# Patient Record
Sex: Female | Born: 1940
Health system: Southern US, Community
[De-identification: ages and names within clinical notes are randomized; demographics above are authoritative.]

## PROBLEM LIST (undated history)

## (undated) DIAGNOSIS — K5792 Diverticulitis of intestine, part unspecified, without perforation or abscess without bleeding: Secondary | ICD-10-CM

## (undated) DIAGNOSIS — Z853 Personal history of malignant neoplasm of breast: Secondary | ICD-10-CM

## (undated) DIAGNOSIS — M75102 Unspecified rotator cuff tear or rupture of left shoulder, not specified as traumatic: Secondary | ICD-10-CM

## (undated) DIAGNOSIS — I10 Essential (primary) hypertension: Secondary | ICD-10-CM

## (undated) DIAGNOSIS — K219 Gastro-esophageal reflux disease without esophagitis: Secondary | ICD-10-CM

## (undated) DIAGNOSIS — E785 Hyperlipidemia, unspecified: Secondary | ICD-10-CM

## (undated) DIAGNOSIS — M419 Scoliosis, unspecified: Secondary | ICD-10-CM

## (undated) DIAGNOSIS — C801 Malignant (primary) neoplasm, unspecified: Secondary | ICD-10-CM

## (undated) DIAGNOSIS — K5790 Diverticulosis of intestine, part unspecified, without perforation or abscess without bleeding: Secondary | ICD-10-CM

## (undated) DIAGNOSIS — Z923 Personal history of irradiation: Secondary | ICD-10-CM

## (undated) DIAGNOSIS — H409 Unspecified glaucoma: Secondary | ICD-10-CM

## (undated) DIAGNOSIS — I2510 Atherosclerotic heart disease of native coronary artery without angina pectoris: Secondary | ICD-10-CM

## (undated) HISTORY — DX: Unspecified rotator cuff tear or rupture of left shoulder, not specified as traumatic: M75.102

## (undated) HISTORY — DX: Diverticulitis of intestine, part unspecified, without perforation or abscess without bleeding: K57.92

## (undated) HISTORY — PX: NASAL SINUS SURGERY: SHX719

## (undated) HISTORY — DX: Hyperlipidemia, unspecified: E78.5

## (undated) HISTORY — DX: Scoliosis, unspecified: M41.9

## (undated) HISTORY — DX: Gastro-esophageal reflux disease without esophagitis: K21.9

## (undated) HISTORY — DX: Personal history of malignant neoplasm of breast: Z85.3

## (undated) HISTORY — DX: Diverticulosis of intestine, part unspecified, without perforation or abscess without bleeding: K57.90

## (undated) HISTORY — DX: Essential (primary) hypertension: I10

## (undated) HISTORY — DX: Unspecified glaucoma: H40.9

## (undated) HISTORY — DX: Atherosclerotic heart disease of native coronary artery without angina pectoris: I25.10

---

## 1979-10-09 HISTORY — PX: APPENDECTOMY: SHX54

## 1980-10-08 HISTORY — PX: BREAST LUMPECTOMY: SHX2

## 1982-10-08 HISTORY — PX: BREAST BIOPSY: SHX20

## 1999-07-03 ENCOUNTER — Other Ambulatory Visit: Admission: RE | Admit: 1999-07-03 | Discharge: 1999-07-03 | Payer: Self-pay | Admitting: Obstetrics and Gynecology

## 2000-02-07 ENCOUNTER — Ambulatory Visit (HOSPITAL_COMMUNITY): Admission: RE | Admit: 2000-02-07 | Discharge: 2000-02-07 | Payer: Self-pay | Admitting: Specialist

## 2000-06-03 ENCOUNTER — Encounter: Payer: Self-pay | Admitting: Obstetrics and Gynecology

## 2000-06-03 ENCOUNTER — Encounter: Admission: RE | Admit: 2000-06-03 | Discharge: 2000-06-03 | Payer: Self-pay | Admitting: Obstetrics and Gynecology

## 2000-09-06 ENCOUNTER — Other Ambulatory Visit: Admission: RE | Admit: 2000-09-06 | Discharge: 2000-09-06 | Payer: Self-pay | Admitting: *Deleted

## 2001-01-02 ENCOUNTER — Ambulatory Visit (HOSPITAL_COMMUNITY): Admission: RE | Admit: 2001-01-02 | Discharge: 2001-01-02 | Payer: Self-pay | Admitting: Gastroenterology

## 2001-06-16 ENCOUNTER — Encounter: Payer: Self-pay | Admitting: Obstetrics and Gynecology

## 2001-06-16 ENCOUNTER — Encounter: Admission: RE | Admit: 2001-06-16 | Discharge: 2001-06-16 | Payer: Self-pay | Admitting: Obstetrics and Gynecology

## 2002-09-29 ENCOUNTER — Encounter: Payer: Self-pay | Admitting: Family Medicine

## 2002-09-29 ENCOUNTER — Encounter: Admission: RE | Admit: 2002-09-29 | Discharge: 2002-09-29 | Payer: Self-pay | Admitting: Family Medicine

## 2003-11-29 ENCOUNTER — Encounter: Admission: RE | Admit: 2003-11-29 | Discharge: 2003-11-29 | Payer: Self-pay | Admitting: Family Medicine

## 2004-11-13 ENCOUNTER — Other Ambulatory Visit: Admission: RE | Admit: 2004-11-13 | Discharge: 2004-11-13 | Payer: Self-pay | Admitting: Family Medicine

## 2005-01-12 ENCOUNTER — Encounter: Admission: RE | Admit: 2005-01-12 | Discharge: 2005-01-12 | Payer: Self-pay | Admitting: Family Medicine

## 2006-04-02 ENCOUNTER — Encounter: Admission: RE | Admit: 2006-04-02 | Discharge: 2006-04-02 | Payer: Self-pay | Admitting: Family Medicine

## 2006-09-10 ENCOUNTER — Other Ambulatory Visit: Admission: RE | Admit: 2006-09-10 | Discharge: 2006-09-10 | Payer: Self-pay | Admitting: Family Medicine

## 2006-10-08 HISTORY — PX: COLONOSCOPY: SHX174

## 2006-12-02 ENCOUNTER — Encounter: Payer: Self-pay | Admitting: Internal Medicine

## 2007-06-19 ENCOUNTER — Encounter: Admission: RE | Admit: 2007-06-19 | Discharge: 2007-06-19 | Payer: Self-pay | Admitting: Family Medicine

## 2008-01-19 ENCOUNTER — Other Ambulatory Visit: Admission: RE | Admit: 2008-01-19 | Discharge: 2008-01-19 | Payer: Self-pay | Admitting: Family Medicine

## 2008-02-06 HISTORY — PX: CORONARY ANGIOPLASTY WITH STENT PLACEMENT: SHX49

## 2008-02-16 ENCOUNTER — Encounter: Payer: Self-pay | Admitting: Internal Medicine

## 2008-02-16 ENCOUNTER — Ambulatory Visit (HOSPITAL_COMMUNITY): Admission: RE | Admit: 2008-02-16 | Discharge: 2008-02-17 | Payer: Self-pay | Admitting: Cardiology

## 2008-02-17 ENCOUNTER — Encounter: Payer: Self-pay | Admitting: Internal Medicine

## 2008-02-17 LAB — CONVERTED CEMR LAB
BUN: 11 mg/dL
CO2: 27 meq/L
Calcium: 8.5 mg/dL
HCT: 37.2 %
Hemoglobin: 12.7 g/dL
MCV: 89.7 fL
RBC: 4.15 M/uL
Sodium: 140 meq/L
WBC: 9.1 10*3/uL

## 2008-03-15 ENCOUNTER — Encounter: Payer: Self-pay | Admitting: Internal Medicine

## 2008-07-13 ENCOUNTER — Encounter: Admission: RE | Admit: 2008-07-13 | Discharge: 2008-07-13 | Payer: Self-pay | Admitting: Family Medicine

## 2008-11-16 ENCOUNTER — Encounter: Payer: Self-pay | Admitting: Internal Medicine

## 2008-12-07 ENCOUNTER — Encounter: Payer: Self-pay | Admitting: Internal Medicine

## 2008-12-07 ENCOUNTER — Other Ambulatory Visit: Admission: RE | Admit: 2008-12-07 | Discharge: 2008-12-07 | Payer: Self-pay | Admitting: Family Medicine

## 2008-12-07 LAB — CONVERTED CEMR LAB
Albumin: 4.2 g/dL
BUN: 16 mg/dL
Calcium: 9.6 mg/dL
Glucose, Bld: 73 mg/dL
Hemoglobin: 15 g/dL
Lymphocytes, automated: 25.8 %
Monocytes Relative: 0.6 %
Potassium: 5.1 meq/L
Sodium: 142 meq/L
Total Protein: 6.7 g/dL
WBC: 7.6 10*3/uL

## 2009-06-01 ENCOUNTER — Encounter: Payer: Self-pay | Admitting: Internal Medicine

## 2009-06-01 LAB — CONVERTED CEMR LAB
ALT: 33 units/L
BUN: 21 mg/dL
Calcium: 9.2 mg/dL
Creatinine, Ser: 1.2 mg/dL
Glucose, Bld: 81 mg/dL
HDL: 47 mg/dL
LDL Cholesterol: 92 mg/dL
Potassium: 5.4 meq/L
Total Bilirubin: 0.6 mg/dL
Total Protein: 6.1 g/dL

## 2009-07-08 LAB — CONVERTED CEMR LAB

## 2009-07-26 ENCOUNTER — Encounter: Admission: RE | Admit: 2009-07-26 | Discharge: 2009-07-26 | Payer: Self-pay | Admitting: Family Medicine

## 2009-08-01 ENCOUNTER — Encounter: Payer: Self-pay | Admitting: Internal Medicine

## 2009-08-03 ENCOUNTER — Encounter: Payer: Self-pay | Admitting: Internal Medicine

## 2009-11-15 ENCOUNTER — Encounter: Payer: Self-pay | Admitting: Internal Medicine

## 2009-11-15 LAB — CONVERTED CEMR LAB
HDL: 47 mg/dL
LDL Cholesterol: 92 mg/dL

## 2009-12-20 ENCOUNTER — Encounter: Payer: Self-pay | Admitting: Internal Medicine

## 2010-02-14 ENCOUNTER — Encounter: Payer: Self-pay | Admitting: Internal Medicine

## 2010-05-16 ENCOUNTER — Encounter: Payer: Self-pay | Admitting: Internal Medicine

## 2010-05-16 LAB — CONVERTED CEMR LAB
AST: 21 units/L
Cholesterol: 164 mg/dL
HDL: 43 mg/dL
LDL Cholesterol: 106 mg/dL

## 2010-07-31 ENCOUNTER — Encounter: Admission: RE | Admit: 2010-07-31 | Discharge: 2010-07-31 | Payer: Self-pay | Admitting: Family Medicine

## 2010-07-31 LAB — HM MAMMOGRAPHY: HM Mammogram: NEGATIVE

## 2010-09-15 ENCOUNTER — Encounter: Payer: Self-pay | Admitting: Internal Medicine

## 2010-09-15 DIAGNOSIS — I10 Essential (primary) hypertension: Secondary | ICD-10-CM

## 2010-09-15 DIAGNOSIS — Z853 Personal history of malignant neoplasm of breast: Secondary | ICD-10-CM

## 2010-09-15 DIAGNOSIS — I2510 Atherosclerotic heart disease of native coronary artery without angina pectoris: Secondary | ICD-10-CM

## 2010-09-15 DIAGNOSIS — E785 Hyperlipidemia, unspecified: Secondary | ICD-10-CM

## 2010-09-22 ENCOUNTER — Ambulatory Visit: Payer: Self-pay | Admitting: Internal Medicine

## 2010-09-22 DIAGNOSIS — R32 Unspecified urinary incontinence: Secondary | ICD-10-CM

## 2010-09-22 DIAGNOSIS — K219 Gastro-esophageal reflux disease without esophagitis: Secondary | ICD-10-CM

## 2010-09-25 DIAGNOSIS — M67919 Unspecified disorder of synovium and tendon, unspecified shoulder: Secondary | ICD-10-CM | POA: Insufficient documentation

## 2010-09-25 DIAGNOSIS — M719 Bursopathy, unspecified: Secondary | ICD-10-CM

## 2010-10-05 ENCOUNTER — Encounter: Payer: Self-pay | Admitting: Internal Medicine

## 2010-10-05 DIAGNOSIS — Z8679 Personal history of other diseases of the circulatory system: Secondary | ICD-10-CM | POA: Insufficient documentation

## 2010-10-06 ENCOUNTER — Telehealth (INDEPENDENT_AMBULATORY_CARE_PROVIDER_SITE_OTHER): Payer: Self-pay | Admitting: *Deleted

## 2010-10-11 ENCOUNTER — Encounter: Payer: Self-pay | Admitting: Internal Medicine

## 2010-10-29 ENCOUNTER — Encounter: Payer: Self-pay | Admitting: Family Medicine

## 2010-11-08 ENCOUNTER — Ambulatory Visit (INDEPENDENT_AMBULATORY_CARE_PROVIDER_SITE_OTHER): Payer: Medicare Other | Admitting: Internal Medicine

## 2010-11-08 ENCOUNTER — Encounter: Payer: Self-pay | Admitting: Internal Medicine

## 2010-11-08 DIAGNOSIS — J329 Chronic sinusitis, unspecified: Secondary | ICD-10-CM | POA: Insufficient documentation

## 2010-11-08 DIAGNOSIS — J309 Allergic rhinitis, unspecified: Secondary | ICD-10-CM | POA: Insufficient documentation

## 2010-11-08 DIAGNOSIS — J019 Acute sinusitis, unspecified: Secondary | ICD-10-CM

## 2010-11-09 NOTE — Letter (Signed)
Summary: Deboraha Sprang @ [facility] @ [employer]   Imported By: Sherian Rein 10/18/2010 12:13:18  _____________________________________________________________________  External Attachment:    Type:   Image     Comment:   External Document

## 2010-11-09 NOTE — Letter (Signed)
Summary: [facility]  [facility]   Imported By: Sherian Rein 10/18/2010 12:16:31  _____________________________________________________________________  External Attachment:    Type:   Image     Comment:   External Document

## 2010-11-09 NOTE — Letter (Signed)
Summary: [facility]  [facility]   Imported By: Sherian Rein 10/18/2010 12:15:31  _____________________________________________________________________  External Attachment:    Type:   Image     Comment:   External Document

## 2010-11-09 NOTE — Cardiovascular Report (Signed)
Summary: [place]  [place]   Imported By: Sherian Rein 10/18/2010 12:19:47  _____________________________________________________________________  External Attachment:    Type:   Image     Comment:   External Document

## 2010-11-09 NOTE — Letter (Signed)
Summary: [facility]  [facility]   Imported By: Sherian Rein 10/18/2010 12:14:28  _____________________________________________________________________  External Attachment:    Type:   Image     Comment:   External Document

## 2010-11-09 NOTE — Assessment & Plan Note (Signed)
Summary: new medicare pt-#-pkg--stc   Vital Signs:  Patient profile:   70 year old female Height:      65 inches (165.10 cm) Weight:      150.12 pounds (68.24 kg) BMI:     25.07 O2 Sat:      95 % on Room air Temp:     97.2 degrees F (36.22 degrees C) oral Pulse rate:   91 / minute BP sitting:   132 / 82  (left arm) Cuff size:   regular  Vitals Entered By: Orlan Leavens RMA (September 22, 2010 3:02 PM)  O2 Flow:  Room air CC: New patient Is Patient Diabetic? No Pain Assessment Patient in pain? no        Primary Care Kirstina Leinweber:  Newt Lukes MD  CC:  New patient.  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 denies adverse side effects related to current therapy. no CP or angina symptoms (but had none prior to elective cath when stents placed)  dyslipidemia - has been followed by cards - reports compliance with ongoing medical treatment and no changes in medication dose or frequency. denies adverse side effects related to current therapy.   breast cancer, right - s/p XRT 1984 - no evidence for recurrence  GERD - reports compliance with ongoing medical treatment and no changes in medication dose or frequency. denies adverse side effects related to current therapy. no abd pain or melena or hx ulcers  Preventive Screening-Counseling & Management  Alcohol-Tobacco     Alcohol drinks/day: <1     Alcohol type: wine     Alcohol Counseling: not indicated; use of alcohol is  not excessive or problematic     Smoking Status: never     Tobacco Counseling: not indicated; no tobacco use  Caffeine-Diet-Exercise     Nutrition Referrals: no     Does Patient Exercise: yes     Exercise Counseling: not indicated; exercise is adequate     Depression Counseling: not indicated; screening negative for depression  Safety-Violence-Falls     Seat Belt Counseling: not indicated; patient wears seat belts     Firearms in the Home: no firearms in the home     Firearm Counseling: not applicable     Violence Counseling: not indicated; no violence risk noted     Fall Risk Counseling: not indicated; no significant falls noted  Clinical Review Panels:  Prevention   Last Mammogram:  ASSESSMENT: Negative - BI-RADS 1^MM DIGITAL SCREENING (07/31/2010)   Last Pap Smear:  Interpretation Result:Negative for intraepithelial Lesion or Malignancy.    (07/08/2009)   Last Colonoscopy:  Location:  [facility].  Results: Hemorrhoids, internal, otherwise normal exam     (01/02/2001)  CBC   WBC:  9.1 (02/17/2008)   RBC:  4.15 (02/17/2008)   Hgb:  12.7 (02/17/2008)   Hct:  37.2 (02/17/2008)   Platelets:  288 (02/17/2008)   MCV  89.7 (02/17/2008)   RDW  12.4 (02/17/2008)  Complete  Metabolic Panel   Glucose:  111 (02/17/2008)   Sodium:  140 (02/17/2008)   Potassium:  4.0 (02/17/2008)   Chloride:  107 (02/17/2008)   CO2:  27 (02/17/2008)   BUN:  11 (02/17/2008)   Creatinine:  0.90 (02/17/2008)   Calcium:  8.5 (02/17/2008)   Current Medications (verified): 1)  Evista 60 Mg Tabs (Raloxifene Hcl) .... Take 1 Every Other Day 2)  Plavix 75 Mg Tabs (Clopidogrel Bisulfate) .... Take 1 By Mouth Once Daily 3)  Nitroglycerin 0.4 Mg/hr Pt24 (Nitroglycerin) .... Use As Needed 4)  Pantoprazole Sodium 40 Mg Tbec (Pantoprazole Sodium) .... Take 1 By Mouth Once Daily 5)  Zetia 10 Mg Tabs (Ezetimibe) .... Take 1 By Mouth Once Daily 6)  Crestor 10 Mg Tabs (Rosuvastatin Calcium) .... Take 1  By Mouth Once Daily 7)  Aspirin 81 Mg Tabs (Aspirin) .... 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 I have reviewed all other systems and they were negative.   Physical Exam  General:  spry, alert, well-developed, well-nourished, and cooperative to examination.    Head:  Normocephalic and atraumatic without obvious abnormalities. No apparent alopecia or balding. Eyes:  vision grossly intact; pupils equal, round and reactive to light.  conjunctiva and lids normal.    Ears:  normal pinnae bilaterally, without erythema, swelling, or tenderness to palpation. TMs clear, without effusion, or cerumen impaction. Hearing grossly normal bilaterally  Mouth:  teeth and gums in good repair; mucous membranes moist, without lesions or ulcers. oropharynx clear without exudate, no erythema.  Neck:  supple, full ROM, no masses, no thyromegaly; no thyroid nodules or tenderness. no JVD or carotid bruits.   Lungs:  normal respiratory effort, no intercostal retractions or use of accessory muscles; normal breath sounds bilaterally - no crackles and no wheezes.    Heart:  normal rate, regular rhythm, no murmur, and no rub. BLE without edema. normal DP pulses and normal cap refill in all 4 extremities     Abdomen:  soft, non-tender, normal bowel sounds, no distention; no masses and no appreciable hepatomegaly or splenomegaly.   Genitalia:  defer Msk:  left shoulder - decreased range of motion on forward flexion, abduction, and internal rotation. Positive impingement signs. Decreased strength with stressing of rotator cuff.  Pain with crossed arm adduction with referred pain into distal deltoid. Tender over a.c. joint and subacromial.  Neurologic:  alert & oriented X3 and cranial nerves II-XII symetrically intact.  strength normal in all extremities, sensation intact to light touch, and gait normal. speech fluent without dysarthria or aphasia; follows commands with good comprehension.  Skin:  no rashes, vesicles, ulcers, or erythema. No nodules or irregularity to palpation.  Psych:  Oriented X3, memory intact for recent and remote, normally interactive, good eye contact, not anxious  appearing, not depressed appearing, and not agitated.      Impression & Recommendations:  Problem # 1:  ROTATOR CUFF SYNDROME, LEFT (ICD-726.10)  denies precipitatiing injury or trauma - change NSAID to rx diclofenac and refer to PT for eval and tx consider need for injection, MRI and or ortho eval depending on response to consev tx  Orders: Prescription Created Electronically [phone]) Physical Therapy Referral (PT)  Problem # 2:  HYPERLIPIDEMIA (ICD-272.4) known CAD and stents 2009 - send for records.labs from cards and cont same meds - will f/u here FLP and LFT when due Her updated medication list for this problem includes:    Zetia 10 Mg Tabs (Ezetimibe) .Marland Kitchen... Take 1 by mouth once daily    Crestor 10 Mg Tabs (Rosuvastatin calcium) .Marland Kitchen... Take 1 by mouth once daily  Problem # 3:  CORONARY ARTERY DISEASE (ICD-414.00) DES x 3 to RCA and LAD 02/2008 - cath report reviewed today no anginal symptoms - cont current meds will need to est with new cards due to upcoming retirement of her card - will refer as needed    Her updated medication list for this problem includes:    Plavix 75 Mg Tabs (Clopidogrel bisulfate) .Marland Kitchen... Take 1 by mouth once daily    Nitroglycerin 0.4 Mg/hr Pt24 (Nitroglycerin) ..... Use as needed    Aspirin 81 Mg Tabs (Aspirin) .Marland Kitchen... Take 1 by mouth once daily  Problem # 4:  BREAST CANCER, HX OF (ICD-V10.3) XRT 1984 - annual mammo with recurrence since that time - reviewed last mammo 07/2010 today  Problem # 5:  GERD (ICD-530.81)  The following medications were removed from the medication list:    Prilosec 20 Mg Cpdr (Omeprazole) .Marland Kitchen... Take 1 by mouth once daily Her updated medication list for this problem includes:    Pantoprazole Sodium 40 Mg Tbec (Pantoprazole sodium) .Marland Kitchen... Take 1 by mouth once daily  Labs Reviewed: Hgb: 12.7 (02/17/2008)   Hct: 37.2 (02/17/2008)  Complete Medication List: 1)  Evista 60 Mg Tabs (Raloxifene hcl) .... Take 1 every other day 2)  Plavix 75 Mg Tabs (Clopidogrel bisulfate) .... Take 1 by mouth once daily 3)  Nitroglycerin 0.4 Mg/hr Pt24 (Nitroglycerin) .... Use as needed 4)  Pantoprazole Sodium 40 Mg Tbec (Pantoprazole sodium) .... Take 1 by mouth once daily 5)  Zetia 10 Mg Tabs (Ezetimibe) .... Take 1 by mouth once daily 6)  Crestor 10 Mg Tabs (Rosuvastatin calcium) .... Take 1 by mouth once daily 7)  Aspirin 81 Mg Tabs (Aspirin) .... Take 1 by mouth once daily 8)  Diclofenac Sodium 75 Mg Tbec (Diclofenac sodium) .Marland Kitchen.. 1 by mouth two times a day x 7 days, then as needed for pain  Other Orders: Pneumococcal Vaccine ([address]) Admin 1st Vaccine (09811)  Patient Instructions: 1)  it was good to see you today. 2)  change advil to diclofenac for antiinflammatory relief - your prescription has been electronically submitted to your pharmacy. Please take as directed. Contact our office if you believe you're having problems with the medication(s). 3)  no other medication changes today. 4)  we'll make referral to physical therapy for your left shoulder  pain. Our office will contact you regarding this appointment once made.  5)  If pain not improved with medication change and therapy efforts, call to consider steroid shot or other treatment options 6)  will send for records and labs from dr. Amil Amen 7)  Please schedule a follow-up appointment in 3-6 months (or when due for cholesterol  check), call sooner if problems.  Prescriptions: DICLOFENAC SODIUM 75 MG TBEC (DICLOFENAC SODIUM) 1 by mouth two times a day x 7 days, then as needed for pain  #30 x 0   Entered and Authorized by:   Newt Lukes MD   Signed by:   Newt Lukes MD on 09/22/2010   Method used:   Electronically to        [facility] [phone]* (retail)       [address] [place], [place]  [address]       Ph: [phone] or [phone]       Fax: [phone]   RxID:   [id number]    Orders Added: 1)  Pneumococcal Vaccine [90732] 2)  Admin 1st Vaccine [90471] 3)  New Patient Level IV [66440] 4)  Prescription Created Electronically [G8553] 5)  Physical Therapy Referral [PT]   Immunizations Administered:  Pneumonia Vaccine:    Vaccine Type: Pneumovax (Medicare)    Site: left deltoid    Mfr: Merck    Dose: 0.5 ml    Route: IM    Given by: Orlan Leavens RMA    Exp. Date: 02/02/2012    Lot #: [id number]    VIS given: 09/22/10   Immunizations Administered:  Pneumonia Vaccine:    Vaccine Type: Pneumovax (Medicare)    Site: left deltoid    Mfr: Merck    Dose: 0.5 ml    Route: IM    Given by: Orlan Leavens RMA    Exp. Date: 02/02/2012    Lot #: [id number]    VIS given: 09/22/10   Pap Smear  Procedure date:  07/08/2009  Findings:      Interpretation Result:Negative for intraepithelial Lesion or Malignancy.     Mammogram  Procedure date:  07/08/2010  Findings:      No specific mammographic evidence of malignancy.

## 2010-11-09 NOTE — Progress Notes (Signed)
  Phone Note Other Incoming   Request: Send information Summary of Call: Records received from Dr. Abigail Miyamoto. 82 pages forwarded to Dr. Felicity Coyer for review.

## 2010-11-15 NOTE — Assessment & Plan Note (Signed)
Summary: ?sinus infection-lb   Vital Signs:  Patient profile:   70 year old female Height:      65 inches (165.10 cm) Weight:      150 pounds (68.18 kg) O2 Sat:      97 % on Room air Temp:     98.0 degrees F (36.67 degrees C) oral Pulse rate:   97 / minute BP sitting:   142 / 82  (left arm) Cuff size:   regular  Vitals Entered By: Orlan Leavens RMA (November 08, 2010 1:20 PM)  O2 Flow:  Room air CC: Sinus infection, URI symptoms Is Patient Diabetic? No Pain Assessment Patient in pain? no      Comments Pt states sxs been going on a whilr. Been taking alka setzer, corcidin, and nyquil. still not better. Not able to sleep at night due to cough   Primary Care Provider:  Newt Lukes MD  CC:  Sinus infection and URI symptoms.  History of Present Illness:  URI Symptoms      This is a 70 year old woman who presents with URI symptoms.  The symptoms began 4 weeks ago.  The severity is described as moderate.  predom L side symptoms - max and frontal region.  The patient reports nasal congestion, purulent nasal discharge, productive cough, and sick contacts, but denies earache.  Associated symptoms include low-grade fever (<100.5 degrees) and response to antipyretic.  The patient denies dyspnea, wheezing, vomiting, and diarrhea.  The patient also reports sneezing, headache, muscle aches, and severe fatigue.  The patient denies response to antihistamine.  Risk factors for Strep sinusitis include unilateral facial pain, poor response to decongestant, and double sickening.  The patient denies the following risk factors for Strep sinusitis: tooth pain, Strep exposure, and absence of cough.    also reviewed chronic med issues: CAD - s/p stent x 3 2009 - reports compliance with ongoing medical treatment and no changes in medication dose or frequency. denies adverse side effects related to current therapy. no CP or angina symptoms (but had none prior to elective cath when stents  placed)  dyslipidemia - has been followed by cards - reports compliance with ongoing medical treatment and no changes in medication dose or frequency. denies adverse side effects related to current therapy.   breast cancer, right - s/p XRT 1984 - no evidence for recurrence  GERD - reports compliance with ongoing medical treatment and no changes in medication dose or frequency. denies adverse side effects related to current therapy. no abd pain or melena or hx ulcers  Current Medications (verified): 1)  Evista 60 Mg Tabs (Raloxifene Hcl) .... Take 1 Every Other Day 2)  Plavix 75 Mg Tabs (Clopidogrel Bisulfate) .... Take 1 By Mouth Once Daily 3)  Nitroglycerin 0.4 Mg/hr Pt24 (Nitroglycerin) .... Use As Needed 4)  Pantoprazole Sodium 40 Mg Tbec (Pantoprazole Sodium) .... Take 1 By Mouth Once Daily 5)  Zetia 10 Mg Tabs (Ezetimibe) .... Take 1 By Mouth Once Daily 6)  Crestor 10 Mg Tabs (Rosuvastatin Calcium) .... Take 1 By Mouth Once Daily 7)  Aspirin 81 Mg Tabs (Aspirin) .... Take 1 By Mouth Once Daily 8)  Diclofenac Sodium 75 Mg Tbec (Diclofenac Sodium) .Marland Kitchen.. 1 By Mouth Two Times A Day X 7 Days, Then As Needed For Pain  Allergies (verified): No Known Drug Allergies  Past History:  Past Medical History: Breast cancer, hx - right - s/p XRT 1984 Coronary artery disease - DES RCA and LAD  02/2008 Hyperlipidemia GERD Urinary incontinence scoliosis with mild chronic LBP  allergic rhinitis  MD roster:  card - edmunds ENT - shoemaker  Review of Systems  The patient denies chest pain, syncope, hemoptysis, and abdominal pain.    Physical Exam  General:  spry, alert, well-developed, well-nourished, and cooperative to examination.   mildly ill, coughing (deep) Eyes:  vision grossly intact; pupils equal, round and reactive to light.  conjunctiva and lids normal.    Ears:  R ear normal and L ear normal.   Mouth:  teeth and gums in good repair; mucous membranes moist, without lesions or  ulcers. oropharynx clear without exudate, mod erythema. +thick yellow pnd Lungs:  normal respiratory effort, no intercostal retractions or use of accessory muscles; normal breath sounds bilaterally - no crackles and no wheezes.    Heart:  normal rate, regular rhythm, no murmur, and no rub. BLE without edema.    Impression & Recommendations:  Problem # 1:  ACUTE SINUSITIS, UNSPECIFIED (ICD-461.9)  Her updated medication list for this problem includes:    Amoxicillin-pot Clavulanate 875-125 Mg Tabs (Amoxicillin-pot clavulanate) .Marland Kitchen... 1 by mouth two times a day x 10days    Fluticasone Propionate 50 Mcg/act Susp (Fluticasone propionate) .Marland Kitchen... 2 sprays each nostril  every morning    Hydromet 5-1.5 Mg/2ml Syrp (Hydrocodone-homatropine) .Marland KitchenMarland KitchenMarland KitchenMarland Kitchen 5 cc by mouth every 4 hours as needed for cough symptoms, esp at bedtime  Instructed on treatment. Call if symptoms persist or worsen.   Orders: Prescription Created Electronically [phone])  Problem # 2:  ALLERGIC RHINITIS (ICD-477.9)  Her updated medication list for this problem includes:    Fluticasone Propionate 50 Mcg/act Susp (Fluticasone propionate) .Marland Kitchen... 2 sprays each nostril  every morning    Loratadine 10 Mg Tabs (Loratadine) .Marland Kitchen... 1 by mouth once daily x 10day, then as needed for allergy symptoms  Discussed use of allergy medications and environmental measures.   Orders: Prescription Created Electronically [phone])  Complete Medication List: 1)  Evista 60 Mg Tabs (Raloxifene hcl) .... Take 1 every other day 2)  Plavix 75 Mg Tabs (Clopidogrel bisulfate) .... Take 1 by mouth once daily 3)  Nitroglycerin 0.4 Mg/hr Pt24 (Nitroglycerin) .... Use as needed 4)  Pantoprazole Sodium 40 Mg Tbec (Pantoprazole sodium) .... Take 1 by mouth once daily 5)  Zetia 10 Mg Tabs (Ezetimibe) .... Take 1 by mouth once daily 6)  Crestor 10 Mg Tabs (Rosuvastatin calcium) .... Take 1 by mouth once daily 7)  Aspirin 81 Mg Tabs (Aspirin) .... Take 1 by mouth once  daily 8)  Diclofenac Sodium 75 Mg Tbec (Diclofenac sodium) .Marland Kitchen.. 1 by mouth two times a day x 7 days, then as needed for pain 9)  Amoxicillin-pot Clavulanate 875-125 Mg Tabs (Amoxicillin-pot clavulanate) .Marland Kitchen.. 1 by mouth two times a day x 10days 10)  Fluticasone Propionate 50 Mcg/act Susp (Fluticasone propionate) .... 2 sprays each nostril  every morning 11)  Loratadine 10 Mg Tabs (Loratadine) .Marland Kitchen.. 1 by mouth once daily x 10day, then as needed for allergy symptoms 12)  Hydromet 5-1.5 Mg/16ml Syrp (Hydrocodone-homatropine) .... 5 cc by mouth every 4 hours as needed for cough symptoms, esp at bedtime  Patient Instructions: 1)  it was good to see you today. 2)  augmentin antibioitcs and flonase nasal spray for sinus and allergy symptoms, hydromet cough syrup - your prescriptions have been electronically submitted or faxed to your pharmacy. Please take as directed. Contact our office if you believe you're having problems with the medication(s).  3)  use over the counter generic claritin as discussed (listed below) 4)  Get plenty of rest, drink lots of clear liquids, and use Tylenol or Ibuprofen for fever and comfort. Return in 7-10 days if you're not better:sooner if you're feeling worse. Prescriptions: HYDROMET 5-1.5 MG/5ML SYRP (HYDROCODONE-HOMATROPINE) 5 cc by mouth every 4 hours as needed for cough symptoms, esp at bedtime  #100cc x 0   Entered and Authorized by:   Newt Lukes MD   Signed by:   Newt Lukes MD on 11/08/2010   Method used:   Printed then faxed to ...       [facility]  [address] [phone]* (retail)       [address] [place], [place]  [address]       Ph: [phone] or [phone]       Fax: [phone]   RxID:   [id number] FLUTICASONE PROPIONATE 50 MCG/ACT SUSP (FLUTICASONE PROPIONATE) 2 sprays each nostril  every morning  #1 x 1   Entered and Authorized by:   Newt Lukes MD   Signed by:   Newt Lukes MD on 11/08/2010   Method used:    Electronically to        [facility] [address] [facility] [phone]* (retail)       [address] [place], [place]  [address]       Ph: [phone] or [phone]       Fax: [phone]   RxID:   [id number] AMOXICILLIN-POT CLAVULANATE 875-125 MG TABS (AMOXICILLIN-POT CLAVULANATE) 1 by mouth two times a day x 10days  #20 x 0   Entered and Authorized by:   Newt Lukes MD   Signed by:   Newt Lukes MD on 11/08/2010   Method used:   Electronically to        [facility] [phone]* (retail)       [address] [place], [place]  [address]       Ph: [phone] or [phone]       Fax: [phone]   RxID:   [id number]    Orders Added: 1)  Est. Patient Level IV [85[address]] 2)  Prescription Created Electronically [id number]    Prevention & Chronic Care Immunizations   Influenza vaccine: Not documented    Tetanus booster: 10/08/2005: Historical    Pneumococcal vaccine: Pneumovax (Medicare)  (09/22/2010)    H. zoster vaccine: Not documented  Colorectal Screening   Hemoccult: Not documented    Colonoscopy: Location:  [facility].   Results: Hemorrhoids, internal    Results: Diverticulosis.         (12/02/2006)  Other Screening   Pap smear: Interpretation Result:Negative for intraepithelial Lesion or Malignancy.     (07/08/2009)    Mammogram: ASSESSMENT: Negative - BI-RADS 1^MM DIGITAL SCREENING  (07/31/2010)   Mammogram action/deferral: Screening mammogram in 1 year.     (07/31/2010)    DXA bone density scan: Not documented   Smoking status: never  (09/22/2010)  Lipids   Total Cholesterol: 164  (05/16/2010)   LDL: 106  (05/16/2010)   LDL Direct: Not documented   HDL: 43  (05/16/2010)   Triglycerides: Not documented    SGOT (AST): 21  (05/16/2010)   SGPT (ALT): 22  (05/16/2010)   Alkaline phosphatase: 59  (05/16/2010)   Total bilirubin: 0.6  (06/01/2009)  Hypertension   Last  Blood Pressure: 142 / 82  (11/08/2010)   Serum creatinine: 1.20   (06/01/2009)   Serum potassium 5.4  (06/01/2009)  Self-Management Support :    Hypertension self-management support: Not documented    Lipid self-management support: Not documented

## 2010-11-17 ENCOUNTER — Telehealth: Payer: Self-pay | Admitting: Internal Medicine

## 2010-11-20 ENCOUNTER — Encounter: Payer: Self-pay | Admitting: Internal Medicine

## 2010-11-20 ENCOUNTER — Ambulatory Visit (INDEPENDENT_AMBULATORY_CARE_PROVIDER_SITE_OTHER): Payer: Medicare Other | Admitting: Internal Medicine

## 2010-11-20 ENCOUNTER — Ambulatory Visit (INDEPENDENT_AMBULATORY_CARE_PROVIDER_SITE_OTHER)
Admission: RE | Admit: 2010-11-20 | Discharge: 2010-11-20 | Disposition: A | Discharge status: Home / Self Care | Payer: Medicare Other | Source: Ambulatory Visit | Attending: Internal Medicine | Admitting: Internal Medicine

## 2010-11-20 ENCOUNTER — Other Ambulatory Visit: Payer: Self-pay | Admitting: Internal Medicine

## 2010-11-20 ENCOUNTER — Other Ambulatory Visit: Payer: Medicare Other

## 2010-11-20 DIAGNOSIS — R05 Cough: Secondary | ICD-10-CM

## 2010-11-20 DIAGNOSIS — J309 Allergic rhinitis, unspecified: Secondary | ICD-10-CM

## 2010-11-20 LAB — CBC WITH DIFFERENTIAL/PLATELET
Basophils Absolute: 0.1 10*3/uL (ref 0.0–0.1)
Eosinophils Relative: 17.1 % — ABNORMAL HIGH (ref 0.0–5.0)
MCV: 90.3 fl (ref 78.0–100.0)
Monocytes Absolute: 0.8 10*3/uL (ref 0.1–1.0)
Neutrophils Relative %: 49.6 % (ref 43.0–77.0)
Platelets: 315 10*3/uL (ref 150.0–400.0)
WBC: 8.6 10*3/uL (ref 4.5–10.5)

## 2010-11-20 LAB — BASIC METABOLIC PANEL
BUN: 14 mg/dL (ref 6–23)
Chloride: 101 mEq/L (ref 96–112)
Creatinine, Ser: 0.8 mg/dL (ref 0.4–1.2)
GFR: 77.38 mL/min (ref >60.00)

## 2010-11-23 NOTE — Progress Notes (Signed)
Summary: antibiotic  Phone Note Call from Patient   Caller: Patient Summary of Call: Patient lmovm stating that she was seen 10 days ago and given rx for antibiotic. She has completed that medication and still does not feel better. She is requesting futhur advisement. Thanks Initial call taken by: Rock Nephew CMA,  November 17, 2010 1:48 PM  Follow-up for Phone Call        please make OV to reeval symptoms - may need refer to ent or other testing (labs, ct scan?) - if fever or severe problems, should be seen in Sat clinic or go to urg care/er - thanks Follow-up by: Newt Lukes MD,  November 17, 2010 2:23 PM  Additional Follow-up for Phone Call Additional follow up Details #1::        Notified pt with md response. transferred to schedulers to make appt to see md Additional Follow-up by: Orlan Leavens RMA,  November 17, 2010 2:32 PM

## 2010-11-24 ENCOUNTER — Telehealth: Payer: Self-pay | Admitting: Internal Medicine

## 2010-11-29 ENCOUNTER — Encounter: Payer: Self-pay | Admitting: Pulmonary Disease

## 2010-11-29 ENCOUNTER — Institutional Professional Consult (permissible substitution) (INDEPENDENT_AMBULATORY_CARE_PROVIDER_SITE_OTHER): Payer: Medicare Other | Admitting: Pulmonary Disease

## 2010-11-29 DIAGNOSIS — R05 Cough: Secondary | ICD-10-CM

## 2010-11-29 NOTE — Assessment & Plan Note (Signed)
Summary: PER LUCY  D/T  STC   Vital Signs:  Patient profile:   70 year old female Height:      65 inches (165.10 cm) O2 Sat:      95 % on Room air Temp:     97.9 degrees F (36.61 degrees C) oral Pulse rate:   97 / minute BP sitting:   148 / 90  (left arm) Cuff size:   regular  Vitals Entered By: Orlan Leavens RMA (November 20, 2010 11:28 AM)  O2 Flow:  Room air CC: Ongoing sinus problem, also cough spitting upclear thick phlegm, Cough Is Patient Diabetic? No Pain Assessment Patient in pain? no        Primary Care Provider:  Newt Lukes MD  CC:  Ongoing sinus problem, also cough spitting upclear thick phlegm, and Cough.  History of Present Illness: Cough      This is a 70 year old woman who presents with Cough.  The symptoms began 6-12 months ago.  The intensity is described as moderate.  white thick sputum - constantly present. not improved with abx (10d augmentin for sinus infx <2 weeks ago) or otc cough meds.  The patient reports productive cough, non-productive cough, and malaise, but denies pleuritic chest pain, shortness of breath, wheezing, exertional dyspnea, fever, and hemoptysis.  Associated symtpoms include cold/URI symptoms, sore throat, nasal congestion, and acid reflux symptoms.  The patient denies the following symptoms: weight loss and peripheral edema.  The cough is worse with activity and lying down.  Ineffective prior treatments have included OTC cough medication, throat lozenges, and PPI.  Risk factors include recurrent sinus infections, history of allergic rhinitis, and history of reflux.  Diagnostic testing to date has included ENT evaluation (shoemaker).    also reviewed chronic med issues: CAD - s/p stent x 3 2009 - reports compliance with ongoing medical treatment and no changes in medication dose or frequency. denies adverse side effects related to current therapy. no CP or angina symptoms (but had none prior to elective cath when stents  placed)  dyslipidemia - has been followed by cards - reports compliance with ongoing medical treatment and no changes in medication dose or frequency. denies adverse side effects related to current therapy.   breast cancer, right - s/p XRT 1984 - no evidence for recurrence  GERD - reports compliance with ongoing medical treatment and no changes in medication dose or frequency. denies adverse side effects related to current therapy. no abd pain or melena or hx ulcers   Clinical Review Panels:  CBC   WBC:  7.6 (12/07/2008)   RBC:  4.88 (12/07/2008)   Hgb:  15.0 (12/07/2008)   Hct:  44.6 (12/07/2008)   Platelets:  315 (12/07/2008)   MCV  91.5 (12/07/2008)   RDW  12.4 (12/07/2008)   PMN:  62.5 (12/07/2008)   Monos:  0.6 (12/07/2008)  Complete Metabolic Panel   Glucose:  81 (06/01/2009)   Sodium:  143 (06/01/2009)   Potassium:  5.4 (06/01/2009)   Chloride:  108 (06/01/2009)   CO2:  26 (06/01/2009)   BUN:  21 (06/01/2009)   Creatinine:  1.20 (06/01/2009)   Albumin:  3.7 (06/01/2009)   Total Protein:  6.1 (06/01/2009)   Calcium:  9.2 (06/01/2009)   Total Bili:  0.6 (06/01/2009)   Alk Phos:  59 (05/16/2010)   SGPT (ALT):  22 (05/16/2010)   SGOT (AST):  21 (05/16/2010)   Current Medications (verified): 1)  Evista 60 Mg Tabs (Raloxifene Hcl) .Marland KitchenMarland KitchenMarland Kitchen  Take 1 Every Other Day 2)  Plavix 75 Mg Tabs (Clopidogrel Bisulfate) .... Take 1 By Mouth Once Daily 3)  Nitroglycerin 0.4 Mg/hr Pt24 (Nitroglycerin) .... Use As Needed 4)  Pantoprazole Sodium 40 Mg Tbec (Pantoprazole Sodium) .... Take 1 By Mouth Once Daily 5)  Zetia 10 Mg Tabs (Ezetimibe) .... Take 1 By Mouth Once Daily 6)  Crestor 10 Mg Tabs (Rosuvastatin Calcium) .... Take 1 By Mouth Once Daily 7)  Aspirin 81 Mg Tabs (Aspirin) .... Take 1 By Mouth Once Daily 8)  Diclofenac Sodium 75 Mg Tbec (Diclofenac Sodium) .Marland Kitchen.. 1 By Mouth Two Times A Day X 7 Days, Then As Needed For Pain 9)  Fluticasone Propionate 50 Mcg/act Susp (Fluticasone  Propionate) .... 2 Sprays Each Nostril  Every Morning 10)  Loratadine 10 Mg Tabs (Loratadine) .Marland Kitchen.. 1 By Mouth Once Daily X 10day, Then As Needed For Allergy Symptoms 11)  Hydromet 5-1.5 Mg/61ml Syrp (Hydrocodone-Homatropine) .... 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  The patient denies fever, syncope, dyspnea on exertion, peripheral edema, hemoptysis, abdominal pain, and severe indigestion/heartburn.    Physical Exam  General:  spry, alert, well-developed, well-nourished, and cooperative to examination.   mildly ill, coughing (deep) Eyes:  vision grossly intact; pupils equal, round and reactive to light.  conjunctiva and lids normal.    Ears:  R ear normal and L ear normal.   Mouth:  teeth and gums in good repair; mucous membranes moist, without lesions or ulcers. oropharynx clear without exudate, mod erythema. +thick yellow pnd Lungs:  normal respiratory effort, no intercostal retractions or use of accessory muscles; normal breath sounds bilaterally - no crackles and no wheezes.    Heart:  normal rate, regular rhythm, no murmur, and no rub. BLE without edema.    Impression & Recommendations:  Problem # 1:  COUGH (ICD-786.2) not improved with tx of sinus symptoms 2 weeks ago (augmentin)-  check labs and cxr - reinforced need for acompiance with allg meds and inc  PPI - also inc cough suppression tx ?pulm eval if still not improved - will consider same as needed  Orders: T-2 View CXR (71020TC) TLB-BMP (Basic Metabolic Panel-BMET) (80048-METABOL) TLB-CBC Platelet - w/Differential (85025-CBCD) Prescription Created Electronically [phone])  Problem # 2:  ALLERGIC RHINITIS (ICD-477.9)  Her updated medication list for this problem includes:    Fluticasone Propionate 50 Mcg/act Susp (Fluticasone propionate) .Marland Kitchen... 2 sprays each nostril  every morning    Loratadine 10 Mg Tabs (Loratadine) .Marland Kitchen... 1 by mouth once daily x 10day, then as needed for allergy symptoms  Complete Medication List: 1)  Evista 60 Mg Tabs (Raloxifene hcl) .... Take 1 every other day 2)  Plavix 75 Mg Tabs (Clopidogrel bisulfate) .... Take 1 by mouth once daily 3)  Nitroglycerin 0.4 Mg/hr Pt24 (Nitroglycerin) .... Use as needed 4)  Zetia  10 Mg Tabs (Ezetimibe) .... Take 1 by mouth once daily 5)  Crestor 10 Mg Tabs (Rosuvastatin calcium) .... Take 1 by mouth once daily 6)  Aspirin 81 Mg Tabs (Aspirin) .... Take 1 by mouth once daily 7)  Diclofenac Sodium 75 Mg Tbec (Diclofenac sodium) .Marland Kitchen.. 1 by mouth two times a day x 7 days, then as needed for pain 8)  Fluticasone Propionate 50 Mcg/act Susp (Fluticasone propionate) .... 2 sprays each nostril  every morning 9)  Pantoprazole Sodium 40 Mg Tbec (Pantoprazole sodium) .... Take 1 by mouth two times a day x 2 weeks, then resume once daily 10)  Loratadine 10 Mg Tabs (Loratadine) .Marland Kitchen.. 1 by mouth once daily x 10day, then as needed for allergy symptoms 11)  Tessalon Perles 100 Mg Caps (Benzonatate) .... 2 by mouth at bedtime x 2 weeks and every 6 hours as needed for cough symptoms 12)  Hydromet 5-1.5 Mg/47ml Syrp (Hydrocodone-homatropine) .... 5 cc by mouth every 4 hours as needed for cough symptoms, esp at bedtime  Patient Instructions: 1)  it was good to see you today. 2)  test(s) - labs and chest xray ordered today - your results will be called to  you after review 3)  tessalon pearls 100mg  - take 2 each night before bed, and can use every 6hrs during day if needed.  Can stop after 2 weeks if doing well. 4)  pantoprazole 40mg  two times a day x 2 weeks only, then resume daily. 5)  refill on hydromet cough syrup - use at bedtime and as needed during day 6)  also use nasal steroid spray and claritin (plain) EVERYDAY for next 2 weeks 7)  hard candy to bathe back of throat during day, no throat clearing; limit voice use, no singing for next 2 weeks.  8)  if you develop worsening symptoms or fever, call us and we can reconsider antibiotics but it does not appear necessary to use any anitbiotic at this time  Prescriptions: HYDROMET 5-1.5 MG/5ML SYRP (HYDROCODONE-HOMATROPINE) 5 cc by mouth every 4 hours as needed for cough symptoms, esp at bedtime  #200 x 0   Entered and Authorized by:   Newt Lukes MD   Signed by:   Newt Lukes MD on 11/20/2010   Method used:   Printed then faxed to ...       [facility] [phone]* (retail)       [address] [place], [place]  [address]       Ph: [phone] or [phone]       Fax: [phone]   RxID:   [id number] TESSALON PERLES 100 MG CAPS (BENZONATATE) 2 by mouth at bedtime x 2 weeks and every 6 hours as needed for cough symptoms  #40 x 1   Entered and Authorized by:   Newt Lukes MD   Signed by:   Newt Lukes MD on 11/20/2010   Method used:   Electronically to        [facility] [phone]* (retail)       [address] [place], [place]  [address]       Ph: [phone] or [phone]       Fax: [phone]   RxID:   [id number]    Orders Added: 1)  T-2 View CXR [71020TC] 2)  TLB-BMP (Basic Metabolic Panel-BMET) [80048-METABOL] 3)  TLB-CBC Platelet - w/Differential [  85025-CBCD] 4)  Est. Patient Level IV [78[address]] 5)  Prescription Created Electronically [phone]

## 2010-11-29 NOTE — Progress Notes (Signed)
Summary: XRAY result  Phone Note Call from Patient Call back at Home Phone [phone]   Caller: Patient Summary of Call: Pt called for results of CXR done 02/13. Results recieved from Xray and reviewed by VAL- Normal, no abnormalities, no treatment required.  Pt states she is still coughing and is requesting additoinal advisement Initial call taken by: Margaret Pyle, CMA,  November 24, 2010 9:33 AM  Follow-up for Phone Call        no change rx as per OV instructions but will refer to pulm for eval same Follow-up by: Newt Lukes MD,  November 24, 2010 9:48 AM  Additional Follow-up for Phone Call Additional follow up Details #1::        pt advised vai VM, told to expect call from [facility] with appt info Additional Follow-up by: Margaret Pyle, CMA,  November 24, 2010 1:23 PM

## 2010-12-05 NOTE — Procedures (Signed)
Summary: Upper GI Endoscopy/[facility]  Upper GI Endoscopy/[facility]   Imported By: Sherian Rein 11/30/2010 11:24:39  _____________________________________________________________________  External Attachment:    Type:   Image     Comment:   External Document

## 2010-12-05 NOTE — Letter (Signed)
Summary: [facility]  [facility]   Imported By: Sherian Rein 11/30/2010 11:27:48  _____________________________________________________________________  External Attachment:    Type:   Image     Comment:   External Document

## 2010-12-05 NOTE — Procedures (Signed)
Summary: Colonoscopy/[facility]  Colonoscopy/[facility]   Imported By: Sherian Rein 11/30/2010 11:30:00  _____________________________________________________________________  External Attachment:    Type:   Image     Comment:   External Document

## 2010-12-05 NOTE — Letter (Signed)
Summary: [facility]   Imported By: Sherian Rein 11/30/2010 11:28:45  _____________________________________________________________________  External Attachment:    Type:   Image     Comment:   External Document

## 2010-12-06 ENCOUNTER — Telehealth (INDEPENDENT_AMBULATORY_CARE_PROVIDER_SITE_OTHER): Payer: Self-pay | Admitting: *Deleted

## 2010-12-08 ENCOUNTER — Telehealth (INDEPENDENT_AMBULATORY_CARE_PROVIDER_SITE_OTHER): Payer: Self-pay | Admitting: *Deleted

## 2010-12-08 ENCOUNTER — Encounter: Payer: Self-pay | Admitting: Adult Health

## 2010-12-08 ENCOUNTER — Ambulatory Visit (INDEPENDENT_AMBULATORY_CARE_PROVIDER_SITE_OTHER): Payer: Medicare Other | Admitting: Adult Health

## 2010-12-08 DIAGNOSIS — R05 Cough: Secondary | ICD-10-CM

## 2010-12-14 NOTE — Progress Notes (Signed)
Summary: refill- tussicaps and tessalon/lmomtcb that sent  Phone Note Call from Patient Call back at Home Phone [phone]   Caller: Patient Call For: clance Summary of Call: pt states that while she is not coughing "as much as she had been" she is till coughing and hoarse. followed kc's recs re: not talkiong for 3 days, etc. since she just ran out of the tussicaps she wants to know if she could get a refill so that she continues to improve. [facility] [address]  Initial call taken by: Tivis Ringer, CNA,  December 06, 2010 2:38 PM  Follow-up for Phone Call        Called, spoke with pt.  States she completed the cyclical cough protocol, and today is the first day she has talked.  States she is still hoarse and coughing "but not as bad."  States tussicaps seemed to help. Cough was nonprod while on them.  States cough is now prod with thick white mucus.  Has pending OV on 12/18/10 with [facility] and requesting tussicap rx as this was helping. Follow-up by: Gweneth Dimitri RN,  December 06, 2010 3:24 PM  Additional Follow-up for Phone Call Additional follow up Details #1::        she is supposed to transition to tessalon pearls during day for cough.  I do not mind giving her a few more tussicaps to take at night only if needed. tussicaps 8mg /10...one at hs if needed for severe cough  #5, no fills. Additional Follow-up by: Barbaraann Share MD,  December 06, 2010 5:43 PM    Additional Follow-up for Phone Call Additional follow up Details #2::    called spoke with patient, advised of KC's recs as stated above.  pt states that she has been using tessalon during the and needs the tussicaps for at bedtime b/c her coughing keeps her up at night.  pt states she has already used her refill on the tessalon and requests a refill on these as well.  pt aware this will be handled tomorrow and is okay with this.  KC, may pt have refills on the tessalon?  tussicap refilled and med list updated. Boone Master  CNA/MA  December 06, 2010 5:51 PM   Additional Follow-up for Phone Call Additional follow up Details #3:: Details for Additional Follow-up Action Taken: ok to call in #30, 1 fill, same directions  Tessalon sent, lmomtcb to inform same. Zackery Barefoot CMA  December 07, 2010 9:25 AM  Patient phoned stated that she was returning a call to triage she can be reached at [phone].Vedia Coffer  December 07, 2010 2:30 PM  Spoke with pt and notified tessalon refilled Vernie Murders  December 07, 2010 2:38 PM  Additional Follow-up by: Barbaraann Share MD,  December 06, 2010 5:53 PM  New/Updated Medications: TUSSICAPS 10-8 MG XR12H-CAP (HYDROCOD POLST-CHLORPHEN POLST) 1 capsule by mouth at bedtime as needed for severe cough Prescriptions: TESSALON PERLES 100 MG  CAPS (BENZONATATE) One to two by mouth 3-4 times daily  #30 x 1   Entered by:   Zackery Barefoot CMA   Authorized by:   Barbaraann Share MD   Signed by:   Zackery Barefoot CMA on 12/07/2010   Method used:   Electronically to        [facility] [phone]* (retail)       [address] [place], [place]  [address]  Ph: [phone] or [phone]       Fax: [phone]   RxID:   [id number] TUSSICAPS 10-8 MG XR12H-CAP (HYDROCOD POLST-CHLORPHEN POLST) 1 capsule by mouth at bedtime as needed for severe cough  #5 x 0   Entered by:   Boone Master CNA/MA   Authorized by:   Barbaraann Share MD   Signed by:   Boone Master CNA/MA on 12/06/2010   Method used:   Telephoned to ...       [facility]  [address] [phone]* (retail)       [address] [place], [place]  [address]       Ph: [phone] or [phone]       Fax: [phone]   RxID:   [id number]

## 2010-12-14 NOTE — Progress Notes (Signed)
Summary: cough  Phone Note Call from Patient Call back at Home Phone [phone]   Caller: Patient Call For: clance Summary of Call: Pt c/o cough x several weeks has appt on 3/12 wants to be seen sooner pls advise.[facility] [address] Initial call taken by: Darletta Moll,  December 08, 2010 12:57 PM  Follow-up for Phone Call        Spoke with pt.  She states that she is still having alot of cough despite following cyclical cough protocol "to a tee".  She states that she is can not get sleep b/c coughs so much at night. Sounds hoarse.  She states can not wait until 3/12 to see [facility].  Sched her to see TP this pm at 3:45 pm. Follow-up by: Vernie Murders,  December 08, 2010 2:07 PM

## 2010-12-18 ENCOUNTER — Ambulatory Visit: Payer: Medicare Other | Admitting: Pulmonary Disease

## 2010-12-19 ENCOUNTER — Telehealth: Payer: Self-pay | Admitting: Pulmonary Disease

## 2010-12-19 NOTE — Assessment & Plan Note (Addendum)
Summary: Acute NP office visit - cough   Primary Provider/Referring Provider:  Newt Lukes MD  CC:  cough occ producing thick clear mucus, increased SOB - states has been following cyclical cough, and stopped the claritin.  reports that tussicaps and tessalon only suppress the cough, and Back Pain.  History of Present Illness: 70 yo female seen for initial pulmonary consult on 11/29/10 for chronic cough. Never smoker   December 08, 2010--Presents for work in visit for persistent cough. Last week seen for chronic cough consult. Started on cyclical cough regimen with tussicaps.  CXR: w/ no acute changes.  She has had no improvement. Complains of cough occ producing thick clear mucus, increased SOB - states has been following cyclical cough, and stopped the claritin.  Tussicaps and tessalon only work for short time but do not stop it all day. Cough is wearing her out. Denies chest pain, dyspnea, orthopnea, hemoptysis, fever, n/v/d, edema, headache,gerd symptoms, discolored mucus. Has alot of throat clearing.  She is still working as Scientist, research (medical)    Medications Prior to Update: 1)  Evista 60 Mg Tabs (Raloxifene Hcl) .... Take 1 Every Other Day 2)  Plavix 75 Mg Tabs (Clopidogrel Bisulfate) .... Take 1 By Mouth Once Daily 3)  Zetia 10 Mg Tabs (Ezetimibe) .... Take 1 By Mouth Once Daily 4)  Crestor 10 Mg Tabs (Rosuvastatin Calcium) .... Take 1 By Mouth Once Daily 5)  Aspirin 81 Mg Tabs (Aspirin) .... Take 1 By Mouth Once Daily 6)  Diclofenac Sodium 75 Mg Tbec (Diclofenac Sodium) .Marland Kitchen.. 1 By Mouth Two Times A Day X 7 Days, Then As Needed For Pain 7)  Fluticasone Propionate 50 Mcg/act Susp (Fluticasone Propionate) .... 2 Sprays Each Nostril  Every Morning 8)  Pantoprazole Sodium 40 Mg Tbec (Pantoprazole Sodium) .... Take 1 By Mouth Two Times A Day X 2 Weeks, Then Resume Once Daily 9)  Loratadine 10 Mg Tabs (Loratadine) .Marland Kitchen.. 1 By Mouth Once Daily X 10day, Then As Needed For Allergy Symptoms 10)   Tessalon Perles 100 Mg Caps (Benzonatate) .... 2 By Mouth At Bedtime X 2 Weeks and Every 6 Hours As Needed For Cough Symptoms 11)  Hydromet 5-1.5 Mg/68ml Syrp (Hydrocodone-Homatropine) .... 5 Cc By Mouth Every 4 Hours As Needed For Cough Symptoms, Esp At Bedtime 12)  Tussicaps 10-8 Mg Xr12h-Cap (Hydrocod Polst-Chlorphen Polst) .Marland Kitchen.. 1 Capsule By Mouth At Bedtime As Needed For Severe Cough 13)  Tessalon Perles 100 Mg  Caps (Benzonatate) .... One To Two By Mouth 3-4 Times Daily 14)  Nitroglycerin 0.4 Mg/hr Pt24 (Nitroglycerin) .... Use As Needed  Allergies (verified): No Known Drug Allergies  Past History:  Past Medical History: Last updated: 11/20/2010 Breast cancer, hx - right - s/p XRT 1984 Coronary artery disease - DES RCA and LAD 02/2008 Hyperlipidemia  GERD Urinary incontinence scoliosis with mild chronic LBP  allergic rhinitis  MD roster:  card - [facility] - shoemaker  Past Surgical History: Last updated: 10/11/2010 Appendectomy (1988) PTCA/stent 02/2008 Breast biopsy (1984) Lumpectomy, right breast  Family History: Last updated: 09/22/2010 Family History Lung cancer Heart disease (other relative)  Social History: Last updated: 11/20/2010 Never Smoked, social alcohol (wine 2x/week)  widowed, lives alone 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  reports that tussicaps and tessalon only suppress the cough, Back Pain Is Patient Diabetic? No Comments Medications reviewed with patient Daytime contact number verified with patient. Boone Master CNA/MA  December 08, 2010 3:54 PM    Physical Exam  Additional Exam:  GEN: A/Ox3; pleasant , NAD HEENT:  [place]/AT, , EACs-clear, TMs-wnl, NOSE-clear, THROAT-clear NECK:  Supple w/ fair ROM; no JVD; normal carotid impulses w/o bruits; no thyromegaly or nodules palpated; no lymphadenopathy. RESP  Coarse BS w/ no wheezing or crackles CARD:  RRR, no m/r/g   GI:   Soft & nt; nml bowel sounds; no organomegaly or masses detected. Musco: Warm bil,  no calf tenderness edema, clubbing, pulses intact Neuro: intact w/ no focal deficits noted    Impression & Recommendations:  Problem # 1:  COUGH, CHRONIC (ICD-786.2)  Cyclical cough unresponsive to currrent regimen. work up unrevealing may have component of sinus drianage +/- GERD aggressive cough suppression , GERD and Rhinits prevention   Plan :  Prednisone taper over next week  Delsym 2 tsp two times a day  Tessalon 2 tabs three times a day  Add Pepcid 20mg  at bedtime  Protonix 40mg  two times a day  Zyrtec 10mg  once daily  Chlorpheneramine (chlor tabs) 4mg  2 by mouth at bedtime  Stop Tussicaps  USe Hydromet 1-2 tsp every 4-6 hrs as needed for breakthrough coughing.  Water, ice chips, sugarless candy to avoid coughing and throat clearing. NO MINTS  GOAL IS NO COUGHING OR THROAT CLEARING Voice rest.  follow up Dr. Shelle Iron in 2 weeks as planned.  Please contact office for sooner follow up if symptoms do not improve or worsen   Orders: Est. Patient Level IV (60454)  Medications Added to Medication List This Visit: 1)  Tessalon Perles 100 Mg Caps (Benzonatate) .Marland Kitchen.. 1-2 by mouth every 8hr as needed cough 2)  Prednisone 10 Mg Tabs (Prednisone) .... 4 tabs for 3 days, then 3 tabs for 3days, 2 tabs for 3 days, then 1 tab for 3 days, then  stop 3)  Hydromet 5-1.5 Mg/21ml Syrp (Hydrocodone-homatropine) .Marland Kitchen.. 1-2 tsp every 4-6 hrs as needed cough  Patient Instructions: 1)  Prednisone taper over next week  2)  Delsym 2 tsp two times a day  3)  Tessalon 2 tabs three times a day  4)  Add Pepcid 20mg  at bedtime  5)  Protonix 40mg  two times a day  6)  Zyrtec 10mg  once daily  7)  Chlorpheneramine (chlor tabs) 4mg  2 by mouth at bedtime  8)  Stop Tussicaps  9)  USe Hydromet 1-2 tsp every 4-6 hrs as needed for breakthrough coughing.  10)  Water, ice chips, sugarless candy to avoid coughing and throat clearing. NO MINTS  11)  GOAL IS NO COUGHING OR THROAT CLEARING 12)  Voice rest.  13)  follow up Dr. Shelle Iron in 2 weeks as planned.  14)  Please contact office for sooner follow up if symptoms do not improve or worsen  Prescriptions: HYDROMET 5-1.5 MG/5ML SYRP (HYDROCODONE-HOMATROPINE) 1-2 tsp every 4-6 hrs as needed cough  #8 oz x 0   Entered and Authorized by:   Rubye Oaks NP   Signed by:   Tammy Parrett NP on 12/08/2010   Method used:   Print then Give to Patient   RxID:   [id number] TESSALON PERLES 100 MG  CAPS (BENZONATATE) 1-2 by mouth every 8hr as needed cough  #60 x 1   Entered and Authorized by:   Rubye Oaks NP   Signed by:   Rubye Oaks NP on 12/08/2010   Method used:   Electronically to        [facility] [phone]* (retail)       [address] [place], [place]  [address]       Ph: [phone] or [phone]       Fax: [phone]   RxID:   [id number] PREDNISONE 10 MG TABS (PREDNISONE) 4 tabs for 3 days, then 3 tabs for 3days, 2 tabs for 3 days, then 1 tab for 3 days, then stop  #30 x 0   Entered and Authorized by:   Rubye Oaks NP   Signed by:   Rubye Oaks NP on 12/08/2010   Method used:   Electronically to        [facility] [phone]* (retail)       [address] [place], [place]  [address]       Ph: [phone] or [phone]       Fax: [phone]   RxID:    [id number]    Immunization History:  Influenza Immunization History:    Influenza:  historical (08/08/2010)   Appended Document: Acute NP office visit - cough noted.  pt never followed up with ENT, nor did she have her ct sinuses done.

## 2010-12-19 NOTE — Assessment & Plan Note (Addendum)
Summary: consult for chronic cough    Copy to:  Rene Paci Primary Provider/Referring Provider:  Newt Lukes MD  CC:  Pulmonary Consult for ongoing cough x 1 year. Marland Kitchen  History of Present Illness: The pt is a 70y/o female who I have been asked to see for chronic cough.  She has had a cough for one year, but the past weeks it has become more severe.  She has been treated with multiple rounds of abx over the last one year, and has seen ENT for recurrent sinusutis symptoms.  She has had a recent cxr that was totally clear.  The pt states the cough comes in paroxysms, and describes a fullness in her throat with a tickle.  She has chronic hoarseness, and frequent throat clearing.  She has some postnasal drip, but does not feel it is a big issue for her.  She takes a PPI, but denies reflux symptoms.  She has no h/o asthma as a child.  She does work in a Airline pilot, but does not think this bothers her cough.  She does have a remote history of breast cancer, but no recurrence to her knowledge.    Medications Prior to Update: 1)  Evista 60 Mg Tabs (Raloxifene Hcl) .... Take 1 Every Other Day 2)  Plavix 75 Mg Tabs (Clopidogrel Bisulfate) .... Take 1 By Mouth Once Daily 3)  Zetia 10 Mg Tabs (Ezetimibe) .... Take 1 By Mouth Once Daily 4)  Crestor 10 Mg Tabs (Rosuvastatin Calcium) .... Take 1 By Mouth Once Daily 5)  Aspirin 81 Mg Tabs (Aspirin) .... Take 1 By Mouth Once Daily 6)  Diclofenac Sodium 75 Mg Tbec (Diclofenac Sodium) .Marland Kitchen.. 1 By Mouth Two Times A Day X 7 Days, Then As Needed For Pain 7)  Fluticasone Propionate 50 Mcg/act Susp (Fluticasone Propionate) .... 2 Sprays Each Nostril  Every Morning 8)  Pantoprazole Sodium 40 Mg Tbec (Pantoprazole Sodium) .... Take 1 By Mouth Two Times A Day X 2 Weeks, Then Resume Once Daily 9)  Loratadine 10 Mg Tabs (Loratadine) .Marland Kitchen.. 1 By Mouth Once Daily X 10day, Then As Needed For Allergy Symptoms 10)  Tessalon Perles 100 Mg Caps (Benzonatate) .... 2 By Mouth  At Bedtime X 2 Weeks and Every 6 Hours As Needed For Cough Symptoms 11)  Hydromet 5-1.5 Mg/42ml Syrp (Hydrocodone-Homatropine) .... 5 Cc By Mouth Every 4 Hours As Needed For Cough Symptoms, Esp At Bedtime 12)  Nitroglycerin 0.4 Mg/hr Pt24 (Nitroglycerin) .... Use As Needed  Allergies (verified): No Known Drug Allergies  Past History:  Past Medical History: Reviewed history from 11/20/2010 and no changes required. Breast cancer, hx - right - s/p XRT 1984 Coronary artery disease - DES RCA and LAD 02/2008 Hyperlipidemia  GERD Urinary incontinence scoliosis with mild chronic LBP  allergic rhinitis  MD roster:  card - [facility] - shoemaker  Past Surgical History: Appendectomy (1914) PTCA/stent 02/2008 Breast biopsy (1984) Lumpectomy, right breast  1982  Family History: Reviewed history from 09/22/2010 and no changes required. emphysema: father, brother heart disease: 3 brothers cancer: brothers (lung and throat)   Social History: Reviewed history from 11/20/2010 and no changes required. Never Smoked,  social alcohol (wine 2x/week)  widowed, lives alone but has sig other has children. 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  The patient denies shortness of breath with activity, shortness of breath at rest, non-productive cough, coughing up blood, chest pain, irregular heartbeats, acid heartburn, indigestion, abdominal pain, difficulty swallowing, tooth/dental problems, headaches, nasal congestion/difficulty breathing through nose, sneezing, itching, ear ache, anxiety, depression, hand/feet swelling, joint stiffness or pain, rash, change in color of mucus, and fever.    Vital Signs:  Patient profile:   70 year old female Height:      65 inches Weight:      146.25 pounds BMI:     24.43 O2 Sat:      95 % on  Room air Temp:     98.1 degrees F oral Pulse rate:   114 / minute BP sitting:   140 / 98  (left arm) Cuff size:   regular  Vitals Entered By: Arman Filter LPN (November 29, 2010 10:55 AM)  O2 Flow:  Room air CC: Pulmonary Consult for ongoing cough x 1 year.  Comments Medications reviewed with patient Arman Filter LPN  November 29, 2010 11:00 AM    Physical Exam  General:  wd female in nad Eyes:  PERRLA and EOMI.   Nose:  mild erythema of mucosa, no purulence or discharge seen. Mouth:  clear.  no lesions or exudates seen Neck:   no jvd, tmg, LN Lungs:  totally clear to auscultation no wheezing or rhonchi  Heart:  rrr, no mrg Abdomen:  soft and nontender, bs+ Extremities:  no edema or cyanosis pulses intact distally Neurologic:  alert and oriented, moves all 4    Impression & Recommendations:  Problem # 1:  COUGH, CHRONIC (ICD-786.2) the pt's cough sounds much more upper airway in origin than lower.  She has no wheezing on exam, has a clear cxr, and her spirometry today shows no airflow obstruction.  I suspect she has a cyclical cough, and her ongoing sinus symptoms may be contributing to this.  I will get the records from Dr. [name] to review.  I am also concerned about the role of LPR, and feel we should continue treatment for this.  Will start the pt on the cyclical cough protocol and see how she does.  A pt instruction sheet was given to her and reviewed.    Medications Added to Medication List This Visit: 1)  Tussicaps 10-8 Mg Xr12h-cap (Hydrocod polst-chlorphen polst) .... One every 12 hrs as needed. 2)  Tessalon Perles 100 Mg Caps (Benzonatate) .... One to two by mouth 3-4 times daily  Other Orders: Consultation Level V (16109) Spirometry w/Graph (60454)  Patient Instructions: 1)  see cyclical cough protocol.  Please follow directions to the letter. 2)  stop loratidine while on the cough protocol, then can take as needed afterwards 3)  stay on protonix for  possible acid reflux. 4)  followup with me in 2 weeks.  Prescriptions: TESSALON PERLES 100 MG  CAPS (BENZONATATE) One to two by mouth 3-4 times daily  #30 x 1   Entered and Authorized by:   Barbaraann Share MD   Signed by:   Barbaraann Share MD on 11/29/2010   Method used:   Print then Give to Patient   RxID:   [id number] TUSSICAPS 10-8 MG XR12H-CAP (HYDROCOD POLST-CHLORPHEN POLST) one every 12 hrs as needed.  #12 x 0   Entered and Authorized by:   Barbaraann Share MD   Signed by:   Barbaraann Share MD on 11/29/2010   Method used:   Print then Give to Patient  RxID:   [id number]   Appended Document: consult for chronic cough  megan, please see if Dr. [name] has done a ct sinus on this pt within the last year?  need report faxed over if has.  thanks.   Appended Document: consult for chronic cough  see above  Appended Document: consult for chronic cough  called and spoke with Elizebeth Brooking from [facility] and was informed pt only saw Dr. [name] once.  Pt was to f/u and get scheduled for CT but pt never returned for f/u appt or had CT done.

## 2010-12-26 NOTE — Progress Notes (Signed)
Summary: nos appt  Phone Note Call from Patient   Caller: [email or web address]  Call For: clance Summary of Call: In ref to nos from 3/12 pt states she won't rsc at this time. Initial call taken by: Darletta Moll,  December 19, 2010 9:20 AM

## 2011-02-20 NOTE — Discharge Summary (Signed)
NAMEMELVA, Lee                 ACCOUNT NO.:  [id number]   MEDICAL RECORD NO.:  [id number]          PATIENT TYPE:  INP   LOCATION:  2626                         FACILITY:  [facility]   PHYSICIAN:  Dorothy Lee, M.D.  DATE OF BIRTH:  07/24/1941   DATE OF ADMISSION:  02/16/2008  DATE OF DISCHARGE:  02/17/2008                               DISCHARGE SUMMARY   DISCHARGE DIAGNOSES:  1. Coronary artery disease, status post drug-eluting stent to the      right coronary artery and left anterior descending artery.  2. Hyperlipidemia.  3. Borderline hypertension.  4. Strong family history of coronary artery disease.  5. Remote breast cancer treated with XRT.  6. Status post appendectomy.  7. Postmenopausal, on hormone replacement therapy.   HOSPITAL COURSE:  Dorothy Lee is a 70 year old female who presented to  her primary care physician recently for substernal pain and chest  tightness.  She ultimately had a stress Cardiolite that showed mild  focal ischemia in the mid septal and mid inferior septal region, normal  EF.  She then underwent a cardiac catheterization at [facility] that showed an 80% tubular LAD stenosis in the proximal  segment as well as a focal 90% stenosis in the right coronary artery.   She was then admitted to [facility] on Feb 16, 2008, for percutaneous  intervention.  She received drug-eluting stents to both the right  coronary artery and left anterior descending artery.  She tolerated the  procedure well.  She remained in the hospital overnight.   Lab studies during her stay included hemoglobin 12.7, hematocrit 37.2,  BUN 11, creatinine 0.9, and potassium 4.0.   She is discharged to home on the following medications.  1. Evista every other day as part of admission.  2. Prilosec 20 mg a day.  3. __________  mg a day.  4. Plavix 75 mg a day.  5. Sublingual nitroglycerin p.r.n. chest pain.   She is to remain on a low-sodium  heart-healthy diet.  Increase activity  slowly as per cardiac rehabilitation.  No lifting over 10 pounds for 1  week.  No driving for 2 days.  Follow up with Dr. Laureen Lee,  nurse practitioner, on Mar 04, 2008, at 11:30 a.m.      Dorothy Lee, P.A.      Dorothy Lee, M.D.  Electronically Signed    LB/MEDQ  D:  02/17/2008  T:  02/18/2008  Job:  [id number]   cc:   Dorothy Lee. Dorothy Lee, M.D.

## 2011-02-23 NOTE — Procedures (Signed)
[place]  Patient:    Dorothy Lee, Dorothy Lee                        MRN: [id number] Proc. Date: 01/02/01 Adm. Date:  [id number] Attending:  Orland Mustard CC:         Chales Salmon. Abigail Miyamoto, M.D.                           Procedure Report  PROCEDURE:  Colonoscopy.  MEDICATIONS:  Fentanyl 70 mcg, Versed 7 mg IV.  INDICATIONS:  Recent rectal bleeding.  DESCRIPTION OF PROCEDURE:  Procedure has been explained to patient and consent obtained.  The patient in left lateral decubitus position.  Olympus pediatric video colonoscope was inserted and advanced under direct visualization.  The prep was excellent and we were able to reach the cecum without difficulty. The scope was withdrawn.  The cecum, ascending colon, hepatic flexure, transverse colon, splenic flexure, descending and sigmoid colon were seen well upon removal and no polyps or other lesions were seen.  Moderate internal hemorrhoids were seen in the rectum upon removal of the scope. The scope was withdrawn, the patient tolerated the procedure well.  ASSESSMENT:  Internal hemorrhoids, probably the source of rectal bleeding.  PLAN:  Will give her the hemorrhoid sheet, recommend Metamucil and see back in the office in two months. DD:  01/02/01 TD:  01/02/01 Job: [id number]

## 2011-02-26 ENCOUNTER — Telehealth: Payer: Self-pay | Admitting: Pulmonary Disease

## 2011-02-26 NOTE — Telephone Encounter (Signed)
Spoke with pt and she is c/o increased productive cough with yellow phlegm, head congestion. Pt request an appt tomorrow. None available with [facility] so appt set with TP at 3:30pm. Carron Curie, CMA

## 2011-02-27 ENCOUNTER — Ambulatory Visit (INDEPENDENT_AMBULATORY_CARE_PROVIDER_SITE_OTHER): Payer: Medicare Other | Admitting: Adult Health

## 2011-02-27 ENCOUNTER — Encounter: Payer: Self-pay | Admitting: Adult Health

## 2011-02-27 VITALS — BP 126/82 | HR 87 | Temp 98.3°F | Ht 63.0 in | Wt 148.0 lb

## 2011-02-27 DIAGNOSIS — R059 Cough, unspecified: Secondary | ICD-10-CM

## 2011-02-27 DIAGNOSIS — R05 Cough: Secondary | ICD-10-CM

## 2011-02-27 MED ORDER — HYDROCODONE-HOMATROPINE 5-1.5 MG/5ML PO SYRP: 5.0000 mL | ORAL_SOLUTION | Freq: Four times a day (QID) | ORAL | Status: AC | PRN

## 2011-02-27 MED ORDER — AZITHROMYCIN 250 MG PO TABS: 250.0000 mg | ORAL_TABLET | Freq: Every day | ORAL | Status: AC

## 2011-02-27 MED ORDER — BENZONATATE 100 MG PO CAPS: 100.0000 mg | ORAL_CAPSULE | Freq: Three times a day (TID) | ORAL | Status: DC | PRN

## 2011-02-27 NOTE — Patient Instructions (Addendum)
Zpack take as directed.  Delsym 2 tsp two times a day  Tessalon 2 tabs three times a day   Pepcid 20mg  at bedtime  Protonix 40mg  daily  Zyrtec 10mg  once daily  Chlorpheneramine (chlor tabs) 4mg  2 by mouth at bedtime  USe Hydromet 1-2 tsp every 4-6 hrs as needed for breakthrough coughing.  Water, ice chips, sugarless candy to avoid coughing and throat clearing. NO MINTS  GOAL IS NO COUGHING OR THROAT CLEARING  Voice rest.  follow up Dr. Shelle Iron in 2 weeks as planned.  Please contact office for sooner follow up if symptoms do not improve or worsen

## 2011-02-27 NOTE — Progress Notes (Signed)
  Subjective:    Patient ID: Dorothy Lee, female    DOB: [birth year], 70 y.o.   MRN: [id number]  HPI 70 yo female seen for initial pulmonary consult on 11/29/10 for chronic cough. Never smoker   December 08, 2010--Presents for work in visit for persistent cough. Last week seen for chronic cough consult. Started on cyclical cough regimen with tussicaps. CXR: w/ no acute changes.  She has had no improvement. Complains of cough occ producing thick clear mucus, increased SOB - states has been following cyclical cough, and stopped the claritin. Tussicaps and tessalon only work for short time but do not stop it all day. Cough is wearing her out. Denies chest pain, dyspnea, orthopnea, hemoptysis, fever, n/v/d, edema, headache,gerd symptoms, discolored mucus. Has alot of throat clearing. 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 Initially had dry cough, stuffy nose that has worsened over last few days. Now coughing up yellow mucus and increased cough and throat clearing at night. She does want cough to restart like last time.  Got better from last ov with complete resolution of cough with tx aimed at GERD/AR prevention, steroid taper and cough control measures.   Recent travel to [place] returned last week . Had cough prior to leaving but worse since she is home.       Review of Systems Constitutional:   No  weight loss, night sweats,  Fevers, chills, fatigue, or  lassitude.  HEENT:   No headaches,  Difficulty swallowing,  Tooth/dental problems, or  Sore throat,                + sneezing, itching,  nasal congestion, post nasal drip,   CV:  No chest pain,  Orthopnea, PND, swelling in lower extremities, anasarca, dizziness, palpitations, syncope.   GI  No heartburn, indigestion, abdominal pain, nausea, vomiting, diarrhea, change in bowel habits, loss of appetite, bloody stools.   Resp:  No  excess mucus, no productive cough,    No coughing up of blood.    No wheezing.  No chest wall deformity  Skin: no rash or lesions.  GU: no dysuria, change in color of urine, no urgency or frequency.  No flank pain, no hematuria   MS:  No joint pain or swelling.  No decreased range of motion.  No back pain.  Psych:  No change in mood or affect. No depression or anxiety.  No memory loss.         Objective:   Physical Exam GEN: A/Ox3; pleasant , NAD, well nourished   HEENT:  [place]/AT,  EACs-clear, TMs-wnl, NOSE-clear drainage , THROAT-clear, no lesions, no postnasal drip or exudate noted.   NECK:  Supple w/ fair ROM; no JVD; normal carotid impulses w/o bruits; no thyromegaly or nodules palpated; no lymphadenopathy.  RESP  Clear  P & A; w/o, wheezes/ rales/ or rhonchi.no accessory muscle use, no dullness to percussion  CARD:  RRR, no m/r/g  , no peripheral edema, pulses intact, no cyanosis or clubbing.  GI:   Soft & nt; nml bowel sounds; no organomegaly or masses detected.  Musco: Warm bil, no deformities or joint swelling noted.   Neuro: alert, no focal deficits noted.    Skin: Warm, no lesions or rashes         Assessment & Plan:

## 2011-02-27 NOTE — Assessment & Plan Note (Addendum)
Hx of cyclical cough now with flare w/ associated Bronchitis   Plan;  Zpack take as directed.  Delsym 2 tsp two times a day  Tessalon 2 tabs three times a day   Pepcid 20mg  at bedtime  Protonix 40mg  daily  Zyrtec 10mg  once daily  Chlorpheneramine (chlor tabs) 4mg  2 by mouth at bedtime  USe Hydromet 1-2 tsp every 4-6 hrs as needed for breakthrough coughing.  Water, ice chips, sugarless candy to avoid coughing and throat clearing. NO MINTS  GOAL IS NO COUGHING OR THROAT CLEARING  Voice rest.  follow up Dr. Shelle Iron in 2 weeks as planned.  Please contact office for sooner follow up if symptoms do not improve or worsen

## 2011-03-01 ENCOUNTER — Encounter: Payer: Self-pay | Admitting: Adult Health

## 2011-03-13 ENCOUNTER — Ambulatory Visit: Payer: Medicare Other | Admitting: Adult Health

## 2011-03-20 ENCOUNTER — Ambulatory Visit (INDEPENDENT_AMBULATORY_CARE_PROVIDER_SITE_OTHER): Payer: Medicare Other | Admitting: Adult Health

## 2011-03-20 ENCOUNTER — Encounter: Payer: Self-pay | Admitting: Adult Health

## 2011-03-20 DIAGNOSIS — R05 Cough: Secondary | ICD-10-CM

## 2011-03-20 NOTE — Patient Instructions (Addendum)
Tessalon 2 tabs three times a day As needed  Cough .   Pepcid 20mg  at bedtime  Protonix 40mg  daily  Zyrtec 10mg  once daily  Chlorpheneramine (chlor tabs) 4mg  2 by mouth at bedtime for 2 weeks then As needed  For drainage  USe Hydromet 1-2 tsp every 4-6 hrs as needed for breakthrough coughing.  Water, ice chips, sugarless candy to avoid coughing and throat clearing. NO MINTS   follow up Dr. Shelle Iron in 6  weeks as planned.  Please contact office for sooner follow up if symptoms do not improve or worsen

## 2011-03-20 NOTE — Assessment & Plan Note (Addendum)
Cyclical cough much improved with control of rhinitis and gerd Plan:  Have her return in 6 weeks , if improved will consider d/c of pepcid Tessalon 2 tabs three times a day As needed  Cough .   Pepcid 20mg  at bedtime  Protonix 40mg  daily  Zyrtec 10mg  once daily  Chlorpheneramine (chlor tabs) 4mg  2 by mouth at bedtime for 2 weeks then As needed  For drainage  USe Hydromet 1-2 tsp every 4-6 hrs as needed for breakthrough coughing.  Water, ice chips, sugarless candy to avoid coughing and throat clearing. NO MINTS   follow up Dr. Shelle Iron in 6  weeks as planned.  Please contact office for sooner follow up if symptoms do not improve or worsen

## 2011-03-20 NOTE — Progress Notes (Signed)
Subjective:    Patient ID: Dorothy Lee, female    DOB: Dec 05, 1940, 70 y.o.   MRN: [id number]  HPI 70 yo female seen for initial pulmonary consult on 11/29/10 for chronic cough. Never smoker   December 08, 2010--Presents for work in visit for persistent cough. Last week seen for chronic cough consult. Started on cyclical cough regimen with tussicaps. CXR: w/ no acute changes.  She has had no improvement. Complains of cough occ producing thick clear mucus, increased SOB - states has been following cyclical cough, and stopped the claritin. Tussicaps and tessalon only work for short time but do not stop it all day. Cough is wearing her out. Denies chest pain, dyspnea, orthopnea, hemoptysis, fever, n/v/d, edema, headache,gerd symptoms, discolored mucus. Has alot of throat clearing. 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 Initially had dry cough, stuffy nose that has worsened over last few days. Now coughing up yellow mucus and increased cough and throat clearing at night. She does want cough to restart like last time.  Got better from last ov with complete resolution of cough with tx aimed at GERD/AR prevention, steroid taper and cough control measures.  Recent travel to [place] returned last week . Had cough prior to leaving but worse since she is home.   03/20/11 Follow up  Last ov she had flare of cyclical cough , tx w/ steroid taper, GERD/AR prevention and cough control regimen.  SHe is feeling much better. "best i have felt in long time." cough is almost totally gone.  Drainage is controlled with zyrtec and chlor tabs. NO overt reflux.  No fever or discolored mucus.  She has stopped hydromet and delsym. NO longer using tessalon.   Review of Systems  Constitutional:   No  weight loss, night sweats,  Fevers, chills, fatigue, or  lassitude.  HEENT:   No headaches,  Difficulty  swallowing,  Tooth/dental problems, or  Sore throat,                -sneezing, itching,  nasal congestion, post nasal drip,   CV:  No chest pain,  Orthopnea, PND, swelling in lower extremities, anasarca, dizziness, palpitations, syncope.   GI  No heartburn, indigestion, abdominal pain, nausea, vomiting, diarrhea, change in bowel habits, loss of appetite, bloody stools.   Resp:  No excess mucus, no productive cough,    No coughing up of blood.    No wheezing.  No chest wall deformity  Skin: no rash or lesions.  GU: no dysuria, change in color of urine, no urgency or frequency.  No flank pain, no hematuria   MS:  No joint pain or swelling.  No decreased range of motion.  No back pain.  Psych:  No change in mood or affect. No depression or anxiety.  No memory loss.         Objective:   Physical Exam  GEN: A/Ox3; pleasant , NAD, well nourished   HEENT:  [place]/AT,  EACs-clear, TMs-wnl, NOSE-clear drainage , THROAT-clear, no lesions, no postnasal drip or exudate noted.   NECK:  Supple w/ fair ROM; no JVD; normal carotid impulses w/o bruits; no thyromegaly or nodules palpated; no lymphadenopathy.  RESP  Clear  P & A; w/o, wheezes/ rales/ or rhonchi.no accessory muscle use, no dullness to percussion  CARD:  RRR, no m/r/g  , no peripheral edema, pulses intact, no cyanosis  or clubbing.  GI:   Soft & nt; nml bowel sounds; no organomegaly or masses detected.  Musco: Warm bil, no deformities or joint swelling noted.   Neuro: alert, no focal deficits noted.    Skin: Warm, no lesions or rashes         Assessment & Plan:

## 2011-05-09 ENCOUNTER — Ambulatory Visit: Payer: Medicare Other | Admitting: Pulmonary Disease

## 2011-05-20 NOTE — Progress Notes (Signed)
No visit.  This just showed up on my computer, and pt has apparently cancelled this visit.

## 2011-05-22 ENCOUNTER — Encounter: Payer: Self-pay | Admitting: Internal Medicine

## 2011-05-22 ENCOUNTER — Encounter: Payer: Self-pay | Admitting: Adult Health

## 2011-05-22 ENCOUNTER — Ambulatory Visit (INDEPENDENT_AMBULATORY_CARE_PROVIDER_SITE_OTHER): Payer: Medicare Other | Admitting: Adult Health

## 2011-05-22 VITALS — BP 142/98 | HR 88 | Temp 98.6°F | Ht 63.0 in | Wt 152.2 lb

## 2011-05-22 DIAGNOSIS — R05 Cough: Secondary | ICD-10-CM

## 2011-05-22 MED ORDER — HYDROCODONE-HOMATROPINE 5-1.5 MG/5ML PO SYRP: 5.0000 mL | ORAL_SOLUTION | Freq: Four times a day (QID) | ORAL | Status: AC | PRN

## 2011-05-22 MED ORDER — PREDNISONE 10 MG PO TABS: ORAL_TABLET | ORAL | Status: AC

## 2011-05-22 NOTE — Assessment & Plan Note (Addendum)
Recurrent cough -improved with tx aimed at rhinitis/GERD prevention.  Check CT sinus to r/o sinus dz  Plan;  Prednisone taper over next week.  Delsym 2 tsp two times a day  Tessalon 2 tabs three times a day   Pepcid 20mg  at bedtime  Protonix 40mg  daily  GERD diet  Chlorpheneramine (chlor tabs) 1 in am , 1 at lunch , and 2 by mouth at bedtime  USe Hydromet 1-2 tsp every 4-6 hrs as needed for breakthrough coughing.  Water, ice chips, sugarless candy to avoid coughing and throat clearing. NO MINTS  GOAL IS NO COUGHING OR THROAT CLEARING  Voice rest.  follow up Dr. Shelle Iron in 2 weeks  Please contact office for sooner follow up if symptoms do not improve or worsen

## 2011-05-22 NOTE — Progress Notes (Signed)
Subjective:    Patient ID: Dorothy Lee, female    DOB: 06/15/1941, 70 y.o.   MRN: [id number]  HPI 70 yo female seen for initial pulmonary consult on 11/29/10 for chronic cough. Never smoker   December 08, 2010--Presents for work in visit for persistent cough. Last week seen for chronic cough consult. Started on cyclical cough regimen with tussicaps. CXR: w/ no acute changes.  She has had no improvement. Complains of cough occ producing thick clear mucus, increased SOB - states has been following cyclical cough, and stopped the claritin. Tussicaps and tessalon only work for short time but do not stop it all day. Cough is wearing her out. Denies chest pain, dyspnea, orthopnea, hemoptysis, fever, n/v/d, edema, headache,gerd symptoms, discolored mucus. Has alot of throat clearing. 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 Initially had dry cough, stuffy nose that has worsened over last few days. Now coughing up yellow mucus and increased cough and throat clearing at night. She does want cough to restart like last time.  Got better from last ov with complete resolution of cough with tx aimed at GERD/AR prevention, steroid taper and cough control measures.  Recent travel to [place] returned last week . Had cough prior to leaving but worse since she is home.   03/20/11 Follow up  Last ov she had flare of cyclical cough , tx w/ steroid taper, GERD/AR prevention and cough control regimen.  SHe is feeling much better. "best i have felt in long time." cough is almost totally gone.  Drainage is controlled with zyrtec and chlor tabs. NO overt reflux.  No fever or discolored mucus.  She has stopped hydromet and delsym. NO longer using tessalon.   05/22/2011 Acute OV  Pt complains of cough has started again--unable to sleep due to the cough---off and on x 2 years with white mucus. pt is using the acid  meds and this helps some but the cough is worse at night. she is unable to lay down in the bed due to the cougth.  cough meds she takes does not help at all. Since last ov feeling much better w/ near resolution. Says she did well until about 1-2 weeks ago, cough slowly started to increase. Feels a tickle in throat despite taking Zyrtec and chlor tabs daily . No discolored mucus or fever. Cough is worse at night.  Prednisone helps a lot . Has had 1 course of steroids this year.   Review of Systems  Constitutional:   No  weight loss, night sweats,  Fevers, chills, fatigue, or  lassitude.  HEENT:   No headaches,  Difficulty swallowing,  Tooth/dental problems, or  Sore throat,                -sneezing, itching,  nasal congestion, post nasal drip,   CV:  No chest pain,  Orthopnea, PND, swelling in lower extremities, anasarca, dizziness, palpitations, syncope.   GI  No heartburn, indigestion, abdominal pain, nausea, vomiting, diarrhea, change in bowel habits, loss of appetite, bloody stools.   Resp:  No excess mucus,   No chest wall deformity  Skin: no rash or lesions.  GU: no dysuria, change in color of urine, no urgency or frequency.  No flank pain, no hematuria   MS:  No joint pain or swelling.  No decreased range of motion.  No back pain.  Psych:  No change in mood or affect. No depression or anxiety.  No memory loss.         Objective:   Physical Exam  GEN: A/Ox3; pleasant , NAD, well nourished   HEENT:  [place]/AT,  EACs-clear, TMs-wnl, NOSE-clear drainage , THROAT-clear, no lesions, clear drainage.    NECK:  Supple w/ fair ROM; no JVD; normal carotid impulses w/o bruits; no thyromegaly or nodules palpated; no lymphadenopathy.  RESP  Clear  P & A; w/o, wheezes/ rales/ or rhonchi.no accessory muscle use, no dullness to percussion  CARD:  RRR, no m/r/g  , no peripheral edema, pulses intact, no cyanosis or clubbing.  GI:   Soft & nt; nml bowel sounds; no organomegaly or masses  detected.  Musco: Warm bil, no deformities or joint swelling noted.   Neuro: alert, no focal deficits noted.    Skin: Warm, no lesions or rashes         Assessment & Plan:

## 2011-05-22 NOTE — Patient Instructions (Addendum)
We are setting you up for CT of your sinus.  Prednisone taper over next week.  Delsym 2 tsp two times a day  Tessalon 2 tabs three times a day   Pepcid 20mg  at bedtime  Protonix 40mg  daily  GERD diet  Chlorpheneramine (chlor tabs) 1 in am , 1 at lunch , and 2 by mouth at bedtime  USe Hydromet 1-2 tsp every 4-6 hrs as needed for breakthrough coughing.  Water, ice chips, sugarless candy to avoid coughing and throat clearing. NO MINTS  GOAL IS NO COUGHING OR THROAT CLEARING  Voice rest.  follow up Dr. Shelle Iron in 2 weeks  Please contact office for sooner follow up if symptoms do not improve or worsen

## 2011-05-23 NOTE — Progress Notes (Signed)
Note reviewed Agree with plan as outlined.

## 2011-05-24 ENCOUNTER — Ambulatory Visit (INDEPENDENT_AMBULATORY_CARE_PROVIDER_SITE_OTHER)
Admission: RE | Admit: 2011-05-24 | Discharge: 2011-05-24 | Disposition: A | Discharge status: Home / Self Care | Payer: Medicare Other | Source: Ambulatory Visit | Attending: Adult Health | Admitting: Adult Health

## 2011-05-24 ENCOUNTER — Telehealth: Payer: Self-pay | Admitting: Pulmonary Disease

## 2011-05-24 DIAGNOSIS — R05 Cough: Secondary | ICD-10-CM

## 2011-05-24 NOTE — Telephone Encounter (Signed)
ATC pt at home and work number, lm at home # and spoke to someone at work # that said she was gone for the day. Will wait to see if pt calls back prior to appt

## 2011-05-24 NOTE — Telephone Encounter (Signed)
Spoke with TP and she stated ok for pt to still go ahead with CT of Sinuses while on prednisone.  Called and spoke with [facility] (since pt was already over there waiting to have CT) and informed them ok for pt to have CT.

## 2011-05-25 ENCOUNTER — Telehealth: Payer: Self-pay | Admitting: *Deleted

## 2011-05-25 ENCOUNTER — Encounter: Payer: Self-pay | Admitting: Adult Health

## 2011-05-25 MED ORDER — AMOXICILLIN-POT CLAVULANATE 875-125 MG PO TABS: 1.0000 | ORAL_TABLET | Freq: Two times a day (BID) | ORAL | Status: AC

## 2011-05-25 NOTE — Telephone Encounter (Signed)
Message copied by Lowell Bouton on Fri May 25, 2011  5:45 PM ------      Message from: Boone Master E      Created: Fri May 25, 2011  5:44 PM       Called spoke with patient, advised of CT results / recs as stated by TP.  Pt okay with this and verbalized her understanding.  rx sent to verified pharmacy.

## 2011-06-05 ENCOUNTER — Encounter: Payer: Self-pay | Admitting: Pulmonary Disease

## 2011-06-05 ENCOUNTER — Ambulatory Visit (INDEPENDENT_AMBULATORY_CARE_PROVIDER_SITE_OTHER): Payer: Medicare Other | Admitting: Pulmonary Disease

## 2011-06-05 VITALS — BP 140/70 | HR 84 | Temp 97.9°F | Ht 63.0 in | Wt 152.6 lb

## 2011-06-05 DIAGNOSIS — R05 Cough: Secondary | ICD-10-CM

## 2011-06-05 DIAGNOSIS — Z23 Encounter for immunization: Secondary | ICD-10-CM

## 2011-06-05 NOTE — Assessment & Plan Note (Signed)
The patient has a history of chronic cough is multifactorial.  She obviously has acute and chronic sinusitis which is the main driving force for this, but also has a cyclical component.  I have asked her to finish up her antibiotics, and have also discussed an aggressive nasal hygiene regimen.  I have also reviewed again the behavioral treatments for cyclical coughing.  If her cough continues to be an issue, she will probably need reevaluation by ENT.

## 2011-06-05 NOTE — Patient Instructions (Addendum)
Continue with sinus rinses a.m. And p.m. Until you finish your antibiotics.  Can then use as needed Trial of Q. Nasal 2 sprays each nostril each a.m.  Do not sniff during administration. Stop Pepcid, but continue on your usual Protonix once a day. Can take an antihistamine as needed. Continue with behavioral therapy such as no throat clearing, hard candy, and voice limitation, as needed If your cough recurs, you may need to be reevaluated by ENT Followup with me as needed.

## 2011-06-05 NOTE — Progress Notes (Signed)
Addended by: Salli Quarry on: 06/05/2011 11:46 AM   Modules accepted: Orders

## 2011-06-05 NOTE — Progress Notes (Signed)
  Subjective:    Patient ID: Dorothy Lee, female    DOB: 14-Jul-1941, 70 y.o.   MRN: [id number]  HPI The patient comes in today for followup of her chronic cough.  I have only seen her on one occasion, and felt it was due to an upper airway issue.  She was treated for postnasal drip, laryngopharyngeal reflux, and cyclical coughing.  She continued to have issues, and was seen by our nurse practitioner on multiple visits.  She ultimately had a CT scan of her sinuses which showed significant acute on chronic sinusitis.  She has been started on Augmentin, as well as a prednisone taper and nasal hygiene.  She comes in today where her cough is much improved, and is at least 80% better.  She denies any chest congestion, mucus production, or shortness of breath.   Review of Systems  Constitutional: Negative for fever and unexpected weight change.  HENT: Positive for ear pain, congestion, sore throat, rhinorrhea and postnasal drip. Negative for nosebleeds, sneezing, trouble swallowing, dental problem and sinus pressure.   Eyes: Positive for itching. Negative for redness.  Respiratory: Positive for cough. Negative for chest tightness, shortness of breath and wheezing.   Cardiovascular: Positive for chest pain. Negative for palpitations and leg swelling.  Gastrointestinal: Negative for nausea and vomiting.  Genitourinary: Negative for dysuria.  Musculoskeletal: Negative for joint swelling.  Skin: Negative for rash.  Neurological: Positive for headaches.  Hematological: Does not bruise/bleed easily.  Psychiatric/Behavioral: Negative for dysphoric mood. The patient is not nervous/anxious.        Objective:   Physical Exam Well-developed female in no acute distress Nose with mild erythema, but no purulence or crusting noted Chest totally clear to auscultation Core with regular rate and rhythm Lower extremities without edema, no cyanosis noted Alert and oriented, moves all 4 extremities.         Assessment & Plan:

## 2011-07-18 ENCOUNTER — Other Ambulatory Visit: Payer: Self-pay | Admitting: Internal Medicine

## 2011-07-18 DIAGNOSIS — Z1231 Encounter for screening mammogram for malignant neoplasm of breast: Secondary | ICD-10-CM

## 2011-07-25 ENCOUNTER — Emergency Department (HOSPITAL_COMMUNITY)
Admission: EM | Admit: 2011-07-25 | Discharge: 2011-07-25 | Discharge status: Against Medical Advice | Payer: Medicare Other | Attending: Emergency Medicine | Admitting: Emergency Medicine

## 2011-07-25 ENCOUNTER — Emergency Department (HOSPITAL_COMMUNITY): Payer: Medicare Other

## 2011-07-25 DIAGNOSIS — E7800 Pure hypercholesterolemia, unspecified: Secondary | ICD-10-CM | POA: Insufficient documentation

## 2011-07-25 DIAGNOSIS — R079 Chest pain, unspecified: Secondary | ICD-10-CM | POA: Insufficient documentation

## 2011-07-25 DIAGNOSIS — I2510 Atherosclerotic heart disease of native coronary artery without angina pectoris: Secondary | ICD-10-CM | POA: Insufficient documentation

## 2011-07-25 DIAGNOSIS — K219 Gastro-esophageal reflux disease without esophagitis: Secondary | ICD-10-CM | POA: Insufficient documentation

## 2011-07-25 DIAGNOSIS — Z853 Personal history of malignant neoplasm of breast: Secondary | ICD-10-CM | POA: Insufficient documentation

## 2011-07-25 LAB — BASIC METABOLIC PANEL
BUN: 17 mg/dL (ref 6–23)
CO2: 23 mEq/L (ref 19–32)
Chloride: 105 mEq/L (ref 96–112)
GFR calc non Af Amer: 82 mL/min — ABNORMAL LOW (ref >90)
Glucose, Bld: 116 mg/dL — ABNORMAL HIGH (ref 70–99)
Potassium: 3.5 mEq/L (ref 3.5–5.1)
Sodium: 137 mEq/L (ref 135–145)

## 2011-07-25 LAB — DIFFERENTIAL
Basophils Relative: 1 % (ref 0–1)
Eosinophils Absolute: 0.2 10*3/uL (ref 0.0–0.7)
Eosinophils Relative: 2 % (ref 0–5)
Lymphs Abs: 2 10*3/uL (ref 0.7–4.0)
Monocytes Relative: 6 % (ref 3–12)
Neutrophils Relative %: 61 % (ref 43–77)

## 2011-07-25 LAB — CBC
MCH: 29.1 pg (ref 26.0–34.0)
MCV: 90.6 fL (ref 78.0–100.0)
Platelets: 309 10*3/uL (ref 150–400)
RBC: 4.77 MIL/uL (ref 3.87–5.11)
RDW: 12.6 % (ref 11.5–15.5)

## 2011-07-25 LAB — URINALYSIS, ROUTINE W REFLEX MICROSCOPIC
Bilirubin Urine: NEGATIVE
Ketones, ur: 15 mg/dL — AB
Nitrite: NEGATIVE
Protein, ur: NEGATIVE mg/dL
Urobilinogen, UA: 0.2 mg/dL (ref 0.0–1.0)
pH: 6 (ref 5.0–8.0)

## 2011-07-26 NOTE — Consult Note (Signed)
[name], Lee                 ACCOUNT NO.:  [id number]  MEDICAL RECORD NO.:  [id number]  LOCATION:  [facility]                         FACILITY:  [facility]  PHYSICIAN:  Jake Bathe, MD      DATE OF BIRTH:  1941/06/15  DATE OF CONSULTATION: DATE OF DISCHARGE:  07/25/2011                                CONSULTATION   CARDIOLOGIST:  Armanda Magic, MD.  Former patient of Francisca December, MD  REASON FOR CONSULTATION:  Evaluation of chest pain.  HISTORY OF PRESENT ILLNESS:  Dorothy Lee is a 70 year old female with known coronary artery disease, status post drug-eluting stent to the right coronary artery and left anterior descending artery on Feb 16, 2008 with hyperlipidemia, brothers and sisters with coronary artery disease, remote breast cancer treated with XRT, appendectomy, and postmenopausal who was in her usual state of health, quite active, shopping earlier today when at approximately 1:20 p.m. began to develop substernal chest discomfort with radiation up to her jaw and ears.  She does often get GERD, and she felt as though this was somewhat different than her prior GERD experiences.  She did state however that prior to her stent placement, she did not have any "symptoms."  The symptoms began gradually increasing and she became nervous and she took nitroglycerin up to 3 times without any resolution.  After that third nitroglycerin, she decided to drive on over to the [facility] for further evaluation.  She states that she had some blurry vision after taking 3 nitroglycerin, which gradually resolved.  Once here in the emergency room, after about 10 minutes, her symptoms resolved and since then she has been in her usual state of health and feels quite well.  She denies any recent fevers, chills, rashes, dysphagia.  Approximately 3 months ago, she was having difficulty with coughing, but that has since resolved.  Her sister is here with her today.  Point-of-care  cardiac markers were drawn at 2-1/2-hour interval and both of them are normal.  EKG was performed and personally reviewed, which shows sinus rhythm, rate 83 and 88 with no abnormalities.  Prior EKG from 2009 was also reviewed, which showed nonspecific ST changes.  On that EKG, she also had ectopic atrial rhythm.  Other lab work is unremarkable.  Few bacteria noted on microscopic urinalysis and she had 21-50 white blood cells present.  She did have moderate leukocytosis; however, she was negative nitrite and she is having no dysuria.  Her CBC also is white count of 6.6, hemoglobin of 13.9, and platelet count of 309.  BMP shows a sodium of 137, potassium 3.5, and creatinine 0.79. Chest x-ray personally viewed shows normal heart size with some emphysematous and minimal bronchitic changes, but no acute abnormalities.  PAST MEDICAL HISTORY:  As described above.  SOCIAL HISTORY:  No smoking.  No alcohol.  No drug use.  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  REVIEW OF SYSTEMS:  Unless specified above, all other 12 review of systems negative.  PHYSICAL EXAMINATION:  VITAL SIGNS:  Blood pressure 131/71, temperature 98.7, respirations 13, pulse 86,  satting 98% on room air. GENERAL:  She is alert and oriented x3, very pleasant, looks younger than her stated age. EYES:  Well-perfused conjunctivae.  EOMI.  No scleral icterus. NECK:  Supple.  No lymphadenopathy.  Mild prominence of thyroid tissue noted bilaterally.  No bruits. CARDIOVASCULAR:  Regular rate and rhythm without any appreciable murmurs, rubs, or gallops.  Normal PMI. LUNGS:  Clear to auscultation bilaterally.  Normal respiratory effort. No wheezes.  No rales.  ABDOMEN:  Soft, nontender, normoactive bowel sounds.  No rebound or guarding.  No hepatosplenomegaly appreciated.  No bruits. EXTREMITIES:  No clubbing, cyanosis, or edema.  Normal distal pulses. SKIN:   Warm, dry, and intact.  No rashes. GU:  Deferred. RECTAL:  Deferred. NEUROLOGIC:  Nonfocal.  Cranial nerves II through XII grossly intact.  DATA:  As described above.  Drug-eluting stent in both the right coronary artery and left anterior descending artery.  ASSESSMENT AND PLAN:  A 70 year old female with chest pain, coronary artery disease, gastroesophageal reflux disease, prior breast cancer with normal EKG, normal point-of-care troponins, currently feeling well.  Chest pain.  Given her prior coronary artery disease, certainly, she could have evidence of worsening coronary artery disease; however, she is now in her usual state of health, feeling quite well, and asking to be released from the emergency department to go home.  I think that this is reasonable given her two point-of-care troponins, which are negative and her EKG which is unremarkable and her current clinical status.  I did tell her that in normal circumstances, we would observe one overnight to ensure that there is no worsening symptoms; however, she states that she is fine to go home and I did tell her that if any symptoms worsen or become more worrisome to call 911.  I also told her that the vision blurring that occurred was likely secondary to decrease in blood pressure from all the nitroglycerin that she took.  She understands that she must sit down or lay down if she does take another nitroglycerin and if she does need to be brought to the emergency room, she should call 911.  She states that every year, she had been getting a stress test, and what we will do is I will discuss the case with Dr. Mayford Knife, who has now assumed her care from Dr. Amil Amen and we will have it arranged to where she can get further risk stratification with nuclear stress test.  Her recent stress test was low risk.  This is also reassuring.  I would encourage her to continue with her current home medications, which are Protonix, Zyrtec,  Plavix, aspirin, Zetia, and Crestor 5 mg.  No changes have been made.  She can also take nitroglycerin as needed.  We will have close followup in clinic.  I am fine with her discharge.  This has been explained at length to her and her sister at bedside.  Blood pressure is stable.  Cholesterol can be maintained with both Crestor and Zetia, and I would continue her antiplatelet therapy with both Plavix and aspirin at this point. Continue Protonix for gastroesophageal reflux disease.  Her chest pain might have been possibly secondary to gastroesophageal reflux disease or perhaps musculoskeletal discomfort.     Jake Bathe, MD     MCS/MEDQ  D:  07/25/2011  T:  07/26/2011  Job:  [id number]  cc:   Armanda Magic, M.D. Fax: [phone]  Samuel Jester, DO

## 2011-07-27 LAB — URINE CULTURE
Colony Count: >=100000
Culture  Setup Time: 201210180217

## 2011-08-03 ENCOUNTER — Ambulatory Visit
Admission: RE | Admit: 2011-08-03 | Discharge: 2011-08-03 | Disposition: A | Discharge status: Home / Self Care | Payer: Medicare Other | Source: Ambulatory Visit | Attending: Internal Medicine | Admitting: Internal Medicine

## 2011-08-03 DIAGNOSIS — Z1231 Encounter for screening mammogram for malignant neoplasm of breast: Secondary | ICD-10-CM

## 2011-11-05 ENCOUNTER — Encounter: Payer: Self-pay | Admitting: Internal Medicine

## 2011-11-05 ENCOUNTER — Ambulatory Visit (INDEPENDENT_AMBULATORY_CARE_PROVIDER_SITE_OTHER): Payer: Medicare Other | Admitting: Internal Medicine

## 2011-11-05 DIAGNOSIS — J329 Chronic sinusitis, unspecified: Secondary | ICD-10-CM | POA: Diagnosis not present

## 2011-11-05 DIAGNOSIS — R05 Cough: Secondary | ICD-10-CM | POA: Diagnosis not present

## 2011-11-05 MED ORDER — PREDNISONE (PAK) 10 MG PO TABS: 10.0000 mg | ORAL_TABLET | ORAL | Status: DC

## 2011-11-05 MED ORDER — ALBUTEROL 90 MCG/ACT IN AERS: 2.0000 | INHALATION_SPRAY | Freq: Four times a day (QID) | RESPIRATORY_TRACT | Status: DC | PRN

## 2011-11-05 NOTE — Patient Instructions (Signed)
It was good to see you today. We have reviewed your prior imaging and test results today Treatment with prednisone taper and albuterol inhaler as needed for cough and shortness of breath - Your prescription(s) have been submitted to your pharmacy. Please take as directed and contact our office if you believe you are having problem(s) with the medication(s). Refer for pulmonary function breathing test. You'll be called with this appointment and then called with results after review. Depending on these results. Will consider followup with Dr. Shelle Iron or Dr. [name] as needed

## 2011-11-05 NOTE — Assessment & Plan Note (Signed)
Chronic symptoms - known acute and chronic sinusitis per CT sinuses August 2012 Reported normal spirometry and chest x-ray February 2012 but October 2012 chest x-ray with emphysematous changes Repeat PFTs now, Pred Pak and albuterol inhaler when necessary Sooner followup with pulmonary and/or ENT depending on outcome and response to therapy

## 2011-11-05 NOTE — Assessment & Plan Note (Signed)
Cough symptoms unimproved after pulmonary evaluation and treatment described above, refer to ENT. Has seen [name] for same in past 2-3 years

## 2011-11-05 NOTE — Progress Notes (Signed)
  Subjective:    Patient ID: Dorothy Lee, female    DOB: 29-Feb-1941, 71 y.o.   MRN: [id number]  HPI  Complains of recurrent cough  Past Medical History  Diagnosis Date  . History of breast cancer     right - s/p XRT 1984  . CAD (coronary artery disease)     DES RCA and LAD 02/2008  . Hyperlipidemia   . GERD (gastroesophageal reflux disease)   . Urinary incontinence   . Scoliosis     with mild chronic LBP  . Allergic rhinitis   . ALLERGIC RHINITIS   . Cough     chronic  . HYPERTENSION, BORDERLINE   . ROTATOR CUFF SYNDROME, LEFT      Review of Systems     Objective:   Physical Exam  BP 178/102  Pulse 102  Temp(Src) 96.9 F (36.1 C) (Oral)  Wt 158 lb (71.668 kg)  SpO2 97% Weight: 158 lb (71.668 kg)  Constitutional: She appears well-developed and well-nourished. No distress. mildly hoarse HENT: Head: Normocephalic and atraumatic. Sinus nontender - max and frontal Ears: B TMs ok, no erythema or effusion; Nose: Nose normal.  Mouth/Throat: Oropharynx is clear and moist. No oropharyngeal exudate.  Eyes: Conjunctivae and EOM are normal. Pupils are equal, round, and reactive to light. No scleral icterus.  Neck: Normal range of motion. Neck supple. No JVD present. No thyromegaly present.  Cardiovascular: Normal rate, regular rhythm and normal heart sounds.  No murmur heard. No BLE edema. Pulmonary/Chest: Effort normal and breath sounds normal. No respiratory distress. She has no wheezes.   Lab Results  Component Value Date   WBC 6.6 07/25/2011   HGB 13.9 07/25/2011   HCT 43.2 07/25/2011   PLT 309 07/25/2011   GLUCOSE 116* 07/25/2011   CHOL 164 05/16/2010   HDL 43 05/16/2010   LDLCALC 409 05/16/2010   ALT 22 05/16/2010   AST 21 05/16/2010   NA 137 07/25/2011   K 3.5 07/25/2011   CL 105 07/25/2011   CREATININE 0.79 07/25/2011   BUN 17 07/25/2011   CO2 23 07/25/2011   TSH 0.77 12/07/2008   CXR 07/2011: empysematous and mild bronchitis changes - NAD      Assessment &  Plan:

## 2011-11-13 DIAGNOSIS — H40019 Open angle with borderline findings, low risk, unspecified eye: Secondary | ICD-10-CM | POA: Diagnosis not present

## 2011-11-13 DIAGNOSIS — H43819 Vitreous degeneration, unspecified eye: Secondary | ICD-10-CM | POA: Diagnosis not present

## 2011-11-13 DIAGNOSIS — H4011X0 Primary open-angle glaucoma, stage unspecified: Secondary | ICD-10-CM | POA: Diagnosis not present

## 2011-11-19 ENCOUNTER — Ambulatory Visit (INDEPENDENT_AMBULATORY_CARE_PROVIDER_SITE_OTHER): Payer: Medicare Other | Admitting: Internal Medicine

## 2011-11-19 DIAGNOSIS — R05 Cough: Secondary | ICD-10-CM | POA: Diagnosis not present

## 2011-11-19 LAB — PULMONARY FUNCTION TEST

## 2011-11-19 NOTE — Progress Notes (Signed)
PFT done today. 

## 2011-11-22 ENCOUNTER — Telehealth: Payer: Self-pay | Admitting: *Deleted

## 2011-11-22 NOTE — Telephone Encounter (Signed)
Md received results back form PFT. Per md to call pt to let know PFT normal. Can continue using albuterol inhaler as needed, but if cough is still persistent will need to make ROV for re-evaluation. Pt didn't pick up left msg on vm md response.... 11/22/11@2 :43pm/LMB

## 2011-11-26 ENCOUNTER — Telehealth: Payer: Self-pay | Admitting: Internal Medicine

## 2011-11-26 NOTE — Telephone Encounter (Signed)
Please let patient know her PFTs look okay - mild bronchospasm responding to albuterol, no evidence for COPD, emphysema or asthma. Continue treatment as prescribed and call for office visit if other problems. Thanks

## 2011-11-26 NOTE — Telephone Encounter (Signed)
Called pt on last Thursday and left vm with md response... 11/26/11@10 :34am/LMB

## 2011-12-05 DIAGNOSIS — H4011X0 Primary open-angle glaucoma, stage unspecified: Secondary | ICD-10-CM | POA: Diagnosis not present

## 2011-12-24 ENCOUNTER — Telehealth: Payer: Self-pay | Admitting: Internal Medicine

## 2011-12-24 DIAGNOSIS — Z124 Encounter for screening for malignant neoplasm of cervix: Secondary | ICD-10-CM

## 2011-12-24 NOTE — Telephone Encounter (Signed)
Gyn refer done as requested

## 2011-12-24 NOTE — Telephone Encounter (Signed)
The pt called and is hoping to get a referral to a gynecologist.  She states she needs a pap smear.  Thank you!

## 2011-12-25 NOTE — Telephone Encounter (Signed)
Notified pt... 12/25/11@9 :57am/LMB

## 2012-01-14 DIAGNOSIS — Z1151 Encounter for screening for human papillomavirus (HPV): Secondary | ICD-10-CM | POA: Diagnosis not present

## 2012-01-14 DIAGNOSIS — R350 Frequency of micturition: Secondary | ICD-10-CM | POA: Diagnosis not present

## 2012-01-14 DIAGNOSIS — Z124 Encounter for screening for malignant neoplasm of cervix: Secondary | ICD-10-CM | POA: Diagnosis not present

## 2012-01-14 DIAGNOSIS — Z853 Personal history of malignant neoplasm of breast: Secondary | ICD-10-CM | POA: Diagnosis not present

## 2012-01-14 DIAGNOSIS — Z01419 Encounter for gynecological examination (general) (routine) without abnormal findings: Secondary | ICD-10-CM | POA: Diagnosis not present

## 2012-01-14 DIAGNOSIS — R32 Unspecified urinary incontinence: Secondary | ICD-10-CM | POA: Diagnosis not present

## 2012-01-16 DIAGNOSIS — M79609 Pain in unspecified limb: Secondary | ICD-10-CM | POA: Diagnosis not present

## 2012-01-16 DIAGNOSIS — M2040 Other hammer toe(s) (acquired), unspecified foot: Secondary | ICD-10-CM | POA: Diagnosis not present

## 2012-01-16 DIAGNOSIS — M7730 Calcaneal spur, unspecified foot: Secondary | ICD-10-CM | POA: Diagnosis not present

## 2012-01-16 DIAGNOSIS — M722 Plantar fascial fibromatosis: Secondary | ICD-10-CM | POA: Diagnosis not present

## 2012-01-16 DIAGNOSIS — M7150 Other bursitis, not elsewhere classified, unspecified site: Secondary | ICD-10-CM | POA: Diagnosis not present

## 2012-01-23 DIAGNOSIS — M722 Plantar fascial fibromatosis: Secondary | ICD-10-CM | POA: Diagnosis not present

## 2012-01-23 DIAGNOSIS — M7150 Other bursitis, not elsewhere classified, unspecified site: Secondary | ICD-10-CM | POA: Diagnosis not present

## 2012-01-23 DIAGNOSIS — M659 Synovitis and tenosynovitis, unspecified: Secondary | ICD-10-CM | POA: Diagnosis not present

## 2012-01-23 DIAGNOSIS — M65979 Unspecified synovitis and tenosynovitis, unspecified ankle and foot: Secondary | ICD-10-CM | POA: Diagnosis not present

## 2012-01-30 DIAGNOSIS — M7150 Other bursitis, not elsewhere classified, unspecified site: Secondary | ICD-10-CM | POA: Diagnosis not present

## 2012-01-30 DIAGNOSIS — M659 Synovitis and tenosynovitis, unspecified: Secondary | ICD-10-CM | POA: Diagnosis not present

## 2012-01-30 DIAGNOSIS — M65979 Unspecified synovitis and tenosynovitis, unspecified ankle and foot: Secondary | ICD-10-CM | POA: Diagnosis not present

## 2012-01-30 DIAGNOSIS — M722 Plantar fascial fibromatosis: Secondary | ICD-10-CM | POA: Diagnosis not present

## 2012-02-11 DIAGNOSIS — I10 Essential (primary) hypertension: Secondary | ICD-10-CM | POA: Diagnosis not present

## 2012-02-11 DIAGNOSIS — E7800 Pure hypercholesterolemia, unspecified: Secondary | ICD-10-CM | POA: Diagnosis not present

## 2012-02-11 DIAGNOSIS — Z79899 Other long term (current) drug therapy: Secondary | ICD-10-CM | POA: Diagnosis not present

## 2012-02-11 DIAGNOSIS — I2510 Atherosclerotic heart disease of native coronary artery without angina pectoris: Secondary | ICD-10-CM | POA: Diagnosis not present

## 2012-02-12 DIAGNOSIS — Z79899 Other long term (current) drug therapy: Secondary | ICD-10-CM | POA: Diagnosis not present

## 2012-02-12 DIAGNOSIS — H43819 Vitreous degeneration, unspecified eye: Secondary | ICD-10-CM | POA: Diagnosis not present

## 2012-02-12 DIAGNOSIS — H40019 Open angle with borderline findings, low risk, unspecified eye: Secondary | ICD-10-CM | POA: Diagnosis not present

## 2012-02-12 DIAGNOSIS — H4011X0 Primary open-angle glaucoma, stage unspecified: Secondary | ICD-10-CM | POA: Diagnosis not present

## 2012-02-13 DIAGNOSIS — M659 Synovitis and tenosynovitis, unspecified: Secondary | ICD-10-CM | POA: Diagnosis not present

## 2012-02-13 DIAGNOSIS — M65979 Unspecified synovitis and tenosynovitis, unspecified ankle and foot: Secondary | ICD-10-CM | POA: Diagnosis not present

## 2012-02-13 DIAGNOSIS — M7150 Other bursitis, not elsewhere classified, unspecified site: Secondary | ICD-10-CM | POA: Diagnosis not present

## 2012-07-09 ENCOUNTER — Ambulatory Visit (INDEPENDENT_AMBULATORY_CARE_PROVIDER_SITE_OTHER): Payer: Medicare Other

## 2012-07-09 DIAGNOSIS — Z23 Encounter for immunization: Secondary | ICD-10-CM | POA: Diagnosis not present

## 2012-07-21 ENCOUNTER — Encounter: Payer: Self-pay | Admitting: Adult Health

## 2012-07-21 ENCOUNTER — Ambulatory Visit (INDEPENDENT_AMBULATORY_CARE_PROVIDER_SITE_OTHER): Payer: Medicare Other | Admitting: Adult Health

## 2012-07-21 VITALS — BP 122/74 | HR 74 | Temp 96.9°F | Ht 63.0 in | Wt 150.8 lb

## 2012-07-21 DIAGNOSIS — J329 Chronic sinusitis, unspecified: Secondary | ICD-10-CM

## 2012-07-21 MED ORDER — HYDROCODONE-HOMATROPINE 5-1.5 MG/5ML PO SYRP: 5.0000 mL | ORAL_SOLUTION | Freq: Four times a day (QID) | ORAL | Status: DC | PRN

## 2012-07-21 MED ORDER — PREDNISONE 10 MG PO TABS: ORAL_TABLET | ORAL | Status: DC

## 2012-07-21 MED ORDER — AMOXICILLIN-POT CLAVULANATE 875-125 MG PO TABS: 1.0000 | ORAL_TABLET | Freq: Two times a day (BID) | ORAL | Status: AC

## 2012-07-21 MED ORDER — BENZONATATE 200 MG PO CAPS: 200.0000 mg | ORAL_CAPSULE | Freq: Three times a day (TID) | ORAL | Status: DC | PRN

## 2012-07-21 NOTE — Progress Notes (Signed)
  Subjective:    Patient ID: Dorothy Lee, female    DOB: Jan 20, 1941, 71 y.o.   MRN: [id number]  HPI 71 yo female with known hx of ollowup of her chronic cough and chronic sinus disease.  CT scan of her sinuses 2012 with significant acute on chronic sinusitis.  07/21/2012 Acute OV  Complains of  prod cough with thick white mucus, increased SOB, difficulty sleeping x3weeks - denies tightness, wheezing.  Has sinus congestion and drainage.  Feels like she did last year when she had a sinus infection  No fever or chest pain. No edema.  OTC cold meds not helping.    Review of Systems Constitutional:   No  weight loss, night sweats,  Fevers, chills, + fatigue, or  lassitude.  HEENT:   No headaches,  Difficulty swallowing,  Tooth/dental problems, or  Sore throat,                No sneezing, itching, ear ache,  +nasal congestion, post nasal drip,   CV:  No chest pain,  Orthopnea, PND, swelling in lower extremities, anasarca, dizziness, palpitations, syncope.   GI  No heartburn, indigestion, abdominal pain, nausea, vomiting, diarrhea, change in bowel habits, loss of appetite, bloody stools.   Resp:    No coughing up of blood.    No chest wall deformity  Skin: no rash or lesions.  GU: no dysuria, change in color of urine, no urgency or frequency.  No flank pain, no hematuria   MS:  No joint pain or swelling.  No decreased range of motion.  No back pain.  Psych:  No change in mood or affect. No depression or anxiety.  No memory loss.         Objective:   Physical Exam GEN: A/Ox3; pleasant , NAD, well nourished   HEENT:  [place]/AT,  EACs-clear, TMs-wnl, NOSE-clear drainage , THROAT-clear, no lesions, no postnasal drip or exudate noted.   NECK:  Supple w/ fair ROM; no JVD; normal carotid impulses w/o bruits; no thyromegaly or nodules palpated; no lymphadenopathy.  RESP  Coarse BS w/o, wheezes/ rales/ or rhonchi.no accessory muscle use, no dullness to percussion  CARD:  RRR, no  m/r/g  , no peripheral edema, pulses intact, no cyanosis or clubbing.  GI:   Soft & nt; nml bowel sounds; no organomegaly or masses detected.  Musco: Warm bil, no deformities or joint swelling noted.   Neuro: alert, no focal deficits noted.    Skin: Warm, no lesions or rashes         Assessment & Plan:

## 2012-07-21 NOTE — Progress Notes (Signed)
Ov reviewed, and agree with plan as outlined.  

## 2012-07-21 NOTE — Patient Instructions (Addendum)
Augmentin 875mg  Twice daily  For 10 days  Delsym 2 tsp two times a day  Tessalon 1 every 8hr as needed for cough   Prednisone taper over next week  USe Hydromet 1-2 tsp every 4-6 hrs as needed for breakthrough coughing.  Water, ice chips, sugarless candy to avoid coughing and throat clearing. NO MINTS  GOAL IS NO COUGHING OR THROAT CLEARING  Voice rest.  follow up Dr. Shelle Iron in 4  weeks as planned.  Please contact office for sooner follow up if symptoms do not improve or worsen

## 2012-07-21 NOTE — Assessment & Plan Note (Signed)
Acute on chronic flare w/ bronchitis   Plan Augmentin 875mg  Twice daily  For 10 days  Delsym 2 tsp two times a day  Tessalon 1 every 8hr as needed for cough   Prednisone taper over next week  USe Hydromet 1-2 tsp every 4-6 hrs as needed for breakthrough coughing.  Water, ice chips, sugarless candy to avoid coughing and throat clearing. NO MINTS  GOAL IS NO COUGHING OR THROAT CLEARING  Voice rest.  follow up Dr. Shelle Iron in 4  weeks as planned.  Please contact office for sooner follow up if symptoms do not improve or worsen

## 2012-08-04 ENCOUNTER — Other Ambulatory Visit: Payer: Self-pay | Admitting: Adult Health

## 2012-08-04 NOTE — Telephone Encounter (Signed)
[facility] sent refill request.  HYDROcodone-homatropine (HYCODAN) 5-1.5 MG/5ML syrup --Take 5 milliliters (1 teaspoonful) by mouth every 6 hours if needed #231ml. Last filled 07/1412  #281ml No upcoming appts.  Okay to refill? TP please advise. Thanks

## 2012-08-11 DIAGNOSIS — I10 Essential (primary) hypertension: Secondary | ICD-10-CM | POA: Diagnosis not present

## 2012-08-11 DIAGNOSIS — E7800 Pure hypercholesterolemia, unspecified: Secondary | ICD-10-CM | POA: Diagnosis not present

## 2012-08-11 DIAGNOSIS — I2510 Atherosclerotic heart disease of native coronary artery without angina pectoris: Secondary | ICD-10-CM | POA: Diagnosis not present

## 2012-08-11 DIAGNOSIS — Z79899 Other long term (current) drug therapy: Secondary | ICD-10-CM | POA: Diagnosis not present

## 2012-08-20 ENCOUNTER — Other Ambulatory Visit: Payer: Self-pay | Admitting: Internal Medicine

## 2012-08-20 DIAGNOSIS — Z1231 Encounter for screening mammogram for malignant neoplasm of breast: Secondary | ICD-10-CM

## 2012-08-20 DIAGNOSIS — Z9889 Other specified postprocedural states: Secondary | ICD-10-CM

## 2012-08-20 DIAGNOSIS — Z853 Personal history of malignant neoplasm of breast: Secondary | ICD-10-CM

## 2012-08-25 ENCOUNTER — Telehealth: Payer: Self-pay | Admitting: Adult Health

## 2012-08-25 NOTE — Telephone Encounter (Signed)
Spoke with pt She states that her cough came back after 1 wk of no prednisone and is worse than before Coughs to the point of gagging OV with TP for tomorrow at 11:15 am

## 2012-08-26 ENCOUNTER — Ambulatory Visit (INDEPENDENT_AMBULATORY_CARE_PROVIDER_SITE_OTHER): Payer: Medicare Other | Admitting: Adult Health

## 2012-08-26 ENCOUNTER — Encounter: Payer: Self-pay | Admitting: Adult Health

## 2012-08-26 VITALS — BP 110/70 | HR 74 | Temp 96.6°F | Ht 63.0 in | Wt 150.8 lb

## 2012-08-26 DIAGNOSIS — R05 Cough: Secondary | ICD-10-CM

## 2012-08-26 DIAGNOSIS — J40 Bronchitis, not specified as acute or chronic: Secondary | ICD-10-CM | POA: Diagnosis not present

## 2012-08-26 DIAGNOSIS — R059 Cough, unspecified: Secondary | ICD-10-CM

## 2012-08-26 MED ORDER — HYDROCODONE-HOMATROPINE 5-1.5 MG/5ML PO SYRP: 5.0000 mL | ORAL_SOLUTION | Freq: Four times a day (QID) | ORAL | Status: AC | PRN

## 2012-08-26 MED ORDER — PREDNISONE 10 MG PO TABS: ORAL_TABLET | ORAL | Status: DC

## 2012-08-26 NOTE — Patient Instructions (Addendum)
Add Chlortrimeton 4mg  2 At bedtime   Add Allegra 180mg  daily in am .  Delsym 2 tsp two times a day  Tessalon 1 every 8hr as needed for cough   Prednisone taper over next week  USe Hydromet 1-2 tsp every 4-6 hrs as needed for breakthrough coughing.  Water, ice chips, sugarless candy to avoid coughing and throat clearing. NO MINTS  GOAL IS NO COUGHING OR THROAT CLEARING  Voice rest.  follow up Dr. Shelle Iron in 4  weeks as planned.  Please contact office for sooner follow up if symptoms do not improve or worsen

## 2012-08-27 ENCOUNTER — Ambulatory Visit (INDEPENDENT_AMBULATORY_CARE_PROVIDER_SITE_OTHER)
Admission: RE | Admit: 2012-08-27 | Discharge: 2012-08-27 | Disposition: A | Discharge status: Home / Self Care | Payer: Medicare Other | Source: Ambulatory Visit | Attending: Adult Health | Admitting: Adult Health

## 2012-08-27 DIAGNOSIS — J40 Bronchitis, not specified as acute or chronic: Secondary | ICD-10-CM | POA: Diagnosis not present

## 2012-08-28 NOTE — Assessment & Plan Note (Signed)
Recurrent UAC syndrome  tx for typical triggers   Plan add Chlortrimeton 4mg  2 At bedtime   Add Allegra 180mg  daily in am .  Delsym 2 tsp two times a day  Tessalon 1 every 8hr as needed for cough   Prednisone taper over next week  USe Hydromet 1-2 tsp every 4-6 hrs as needed for breakthrough coughing.  Water, ice chips, sugarless candy to avoid coughing and throat clearing. NO MINTS  GOAL IS NO COUGHING OR THROAT CLEARING  Voice rest.  follow up Dr. Shelle Iron in 4  weeks as planned.  Please contact office for sooner follow up if symptoms do not improve or worsen

## 2012-08-28 NOTE — Progress Notes (Signed)
  Subjective:    Patient ID: Dorothy Lee, female    DOB: October 27, 1940, 71 y.o.   MRN: [id number]  HPI  71 yo female with known hx of ollowup of her chronic cough and chronic sinus disease.  CT scan of her sinuses 2012 with significant acute on chronic sinusitis.  11/19 /2013 Acute OV  Complains of productive cough with thick white mucus, mostly at night, head congestion w/ PND x2 weeks.  Hycodan helps w/ sleeping.  denies wheezing, SOB, tightness in chest, f/c/s Seen 1 month ago for acute on chronic sinusitis w/ Augmentin x 10 .  Got some better but cough never resolved.  No purulent sputum .     Review of Systems  Constitutional:   No  weight loss, night sweats,  Fevers, chills, + fatigue, or  lassitude.  HEENT:   No headaches,  Difficulty swallowing,  Tooth/dental problems, or  Sore throat,                No sneezing, itching, ear ache,  +nasal congestion, post nasal drip,   CV:  No chest pain,  Orthopnea, PND, swelling in lower extremities, anasarca, dizziness, palpitations, syncope.   GI  No heartburn, indigestion, abdominal pain, nausea, vomiting, diarrhea, change in bowel habits, loss of appetite, bloody stools.   Resp:    No coughing up of blood.    No chest wall deformity  Skin: no rash or lesions.  GU: no dysuria, change in color of urine, no urgency or frequency.  No flank pain, no hematuria   MS:  No joint pain or swelling.  No decreased range of motion.  No back pain.  Psych:  No change in mood or affect. No depression or anxiety.  No memory loss.         Objective:   Physical Exam  GEN: A/Ox3; pleasant , NAD, well nourished   HEENT:  [place]/AT,  EACs-clear, TMs-wnl, NOSE-clear drainage , THROAT-clear, no lesions, no postnasal drip or exudate noted.   NECK:  Supple w/ fair ROM; no JVD; normal carotid impulses w/o bruits; no thyromegaly or nodules palpated; no lymphadenopathy.  RESP  Diminshed BS in bases  w/o, wheezes/ rales/ or rhonchi.no accessory muscle  use, no dullness to percussion  CARD:  RRR, no m/r/g  , no peripheral edema, pulses intact, no cyanosis or clubbing.  GI:   Soft & nt; nml bowel sounds; no organomegaly or masses detected.  Musco: Warm bil, no deformities or joint swelling noted.   Neuro: alert, no focal deficits noted.    Skin: Warm, no lesions or rashes         Assessment & Plan:

## 2012-08-29 NOTE — Progress Notes (Signed)
Quick Note:  Called, spoke with pt. Informed her of cxr results and recs per TP. She verbalized understanding. ______ 

## 2012-09-22 ENCOUNTER — Encounter: Payer: Self-pay | Admitting: Pulmonary Disease

## 2012-09-22 ENCOUNTER — Ambulatory Visit (INDEPENDENT_AMBULATORY_CARE_PROVIDER_SITE_OTHER): Payer: Medicare Other | Admitting: Pulmonary Disease

## 2012-09-22 VITALS — BP 132/90 | HR 93 | Temp 98.4°F | Ht 63.0 in | Wt 149.2 lb

## 2012-09-22 DIAGNOSIS — R05 Cough: Secondary | ICD-10-CM | POA: Diagnosis not present

## 2012-09-22 MED ORDER — BECLOMETHASONE DIPROPIONATE 80 MCG/ACT NA AERS: 2.0000 | INHALATION_SPRAY | NASAL | Status: DC

## 2012-09-22 NOTE — Progress Notes (Signed)
  Subjective:    Patient ID: Dorothy Lee, female    DOB: 08-31-1941, 71 y.o.   MRN: [id number]  HPI The patient comes in today for followup of her chronic cough.  This is felt to be upper airway in origin, and secondary to recurrent sinusitis and a hyper sensitized upper airway.  She has had a recent clear chest x-ray, and totally normal full pulmonary function studies earlier in the year.  She was recently seen our nurse practitioner with what sounds like acute sinusitis, and was treated with antibiotics and prednisone.  This resulted in a ramping of her cyclical coughing, and she was seen again by our nurse practitioner and placed on prednisone with behavioral therapies and cough suppression.  She comes in today were cough is definitely better, but she worries that it is beginning to escalate again.  She is clearing her throat frequently during our visit, and he is having ongoing nasal symptoms.  She has asked for a prescription for a topical steroid nasal spray, since this has helped her in the past.   Review of Systems  Constitutional: Negative for fever and unexpected weight change.  HENT: Positive for congestion, rhinorrhea, sneezing, postnasal drip and sinus pressure. Negative for ear pain, nosebleeds, sore throat, trouble swallowing and dental problem.   Eyes: Negative for redness and itching.  Respiratory: Positive for cough and shortness of breath. Negative for chest tightness and wheezing.   Cardiovascular: Positive for palpitations. Negative for leg swelling.  Gastrointestinal: Negative for nausea and vomiting.  Genitourinary: Negative for dysuria.  Musculoskeletal: Negative for joint swelling.  Skin: Negative for rash.  Neurological: Negative for headaches.  Hematological: Does not bruise/bleed easily.  Psychiatric/Behavioral: Negative for dysphoric mood. The patient is not nervous/anxious.        Objective:   Physical Exam Well-developed female in no acute distress Nose  without purulence or discharge noted Oropharynx clear Neck without lymphadenopathy or thyromegaly Chest totally clear to auscultation, no wheezing Cardiac exam is regular rate and rhythm Lower extremities without edema, no cyanosis noted Alert and oriented, moves all 4 extremities.       Assessment & Plan:

## 2012-09-22 NOTE — Assessment & Plan Note (Signed)
The patient's cough is much improved since treated for acute sinusitis earlier in the fall, but she is concerned that her symptoms may be recurring.  I would like to work on a more aggressive nasal hygiene regimen, and have asked her to continue with rinses and will also add a topical nasal steroid.  I have also reviewed with her again the behavioral therapies for cyclical coughing, primarily avoiding throat clearing, and also using hard candy to prevent irritation to the back of her throat.  If she has a recurrence of her sinus symptoms with escalation of cough, it will be necessary for her to see her otolaryngologist.

## 2012-09-22 NOTE — Addendum Note (Signed)
Addended by: Nita Sells on: 09/22/2012 11:14 AM   Modules accepted: Orders

## 2012-09-22 NOTE — Patient Instructions (Addendum)
Will start back on qnasal 2 puffs each nostril each am.  Will give you a prescription for this.  Can continue with allegra each am, and take chlorpheniramine 4mg   2 at bedtime if you are having increased postnasal drip Continue to use hard candy, limit voice use, and no throat clearing when you are having cough issues. If this gets better, but then recurs after you develop increased sinus symptoms, you will need to see Dr. [name] again. Please call us if the cough is not resolving or if gets to a very high level.

## 2012-09-23 ENCOUNTER — Other Ambulatory Visit: Payer: Self-pay | Admitting: Adult Health

## 2012-10-03 ENCOUNTER — Telehealth: Payer: Self-pay | Admitting: Pulmonary Disease

## 2012-10-03 MED ORDER — HYDROCODONE-HOMATROPINE 5-1.5 MG/5ML PO SYRP: 5.0000 mL | ORAL_SOLUTION | Freq: Four times a day (QID) | ORAL | Status: DC | PRN

## 2012-10-03 NOTE — Telephone Encounter (Signed)
We can treat her cough with hycodan, #240, no refill, 1tbsp q6h prn Also, per KC's note, she needs to be referred back to Dr [name] - please ask her to make this appointment or we can make it for her

## 2012-10-03 NOTE — Telephone Encounter (Signed)
Spoke with pt She c/o prod cough with minimal clear sputum Cough keeps her up all night Taking tessalon prn with no response Taking PPIs per [facility] and also chlortrimeton at hs Would like something called in for cough Please advise, thanks! No Known Allergies

## 2012-10-03 NOTE — Telephone Encounter (Signed)
Pt is aware of [facility] recommendations. She does have an appointment coming up with Dr. [name] in January. Rx has been called in to her pharmacy.

## 2012-10-08 HISTORY — PX: ESOPHAGOGASTRODUODENOSCOPY: SHX1529

## 2012-10-13 DIAGNOSIS — R51 Headache: Secondary | ICD-10-CM | POA: Diagnosis not present

## 2012-10-13 DIAGNOSIS — R05 Cough: Secondary | ICD-10-CM | POA: Diagnosis not present

## 2012-10-13 DIAGNOSIS — J329 Chronic sinusitis, unspecified: Secondary | ICD-10-CM | POA: Diagnosis not present

## 2012-10-14 ENCOUNTER — Ambulatory Visit: Payer: Medicare Other

## 2012-10-15 ENCOUNTER — Other Ambulatory Visit: Payer: Self-pay | Admitting: Otolaryngology

## 2012-10-23 ENCOUNTER — Other Ambulatory Visit: Payer: Self-pay | Admitting: Emergency Medicine

## 2012-10-24 ENCOUNTER — Other Ambulatory Visit: Payer: Self-pay | Admitting: Otolaryngology

## 2012-10-24 DIAGNOSIS — J328 Other chronic sinusitis: Secondary | ICD-10-CM

## 2012-10-24 NOTE — Telephone Encounter (Signed)
Pt was supposed to see ENT about her persistent cough.  Did she do this?

## 2012-10-24 NOTE — Telephone Encounter (Signed)
Is this ok to be filled?  Last OV: 09/22/12  Medication last filled : BYRUM,ROBERT S., MD 10/03/2012 5:09 PM Signed  We can treat her cough with hycodan, #240, no refill, 1tbsp q6h prn  Also, per KC's note, she needs to be referred back to Dr [name] - please ask her to make this appointment or we can make it for her

## 2012-10-27 NOTE — Telephone Encounter (Signed)
Spoke with patient, she states she has seen Dr. [name] about her cough. Dr. [name] has placed her on a two week antibiotic and she is to follow that up with a CT scan and then go from there. Patient would like cough syrup because she says she just can not go another two weeks with this cough and Dr. [name] will not prescribe any cough syrup, which seems to be the only thing that gives her relief.  Please advise. Thanks

## 2012-10-27 NOTE — Telephone Encounter (Signed)
Ok for her to have hydromet 5 cc q6h prn, 4 ounces no fills.

## 2012-10-31 ENCOUNTER — Ambulatory Visit: Payer: Medicare Other | Admitting: Adult Health

## 2012-10-31 ENCOUNTER — Encounter: Payer: Self-pay | Admitting: Internal Medicine

## 2012-10-31 ENCOUNTER — Ambulatory Visit (INDEPENDENT_AMBULATORY_CARE_PROVIDER_SITE_OTHER): Payer: Medicare Other | Admitting: Internal Medicine

## 2012-10-31 VITALS — BP 152/90 | HR 103 | Temp 97.6°F | Wt 149.0 lb

## 2012-10-31 DIAGNOSIS — R05 Cough: Secondary | ICD-10-CM | POA: Diagnosis not present

## 2012-10-31 DIAGNOSIS — J309 Allergic rhinitis, unspecified: Secondary | ICD-10-CM

## 2012-10-31 DIAGNOSIS — K219 Gastro-esophageal reflux disease without esophagitis: Secondary | ICD-10-CM | POA: Diagnosis not present

## 2012-10-31 DIAGNOSIS — J329 Chronic sinusitis, unspecified: Secondary | ICD-10-CM

## 2012-10-31 MED ORDER — FAMOTIDINE 20 MG PO TABS: 20.0000 mg | ORAL_TABLET | Freq: Two times a day (BID) | ORAL | Status: DC

## 2012-10-31 MED ORDER — PREDNISONE (PAK) 10 MG PO TABS: 10.0000 mg | ORAL_TABLET | ORAL | Status: AC

## 2012-10-31 MED ORDER — CETIRIZINE HCL 10 MG PO TABS: 10.0000 mg | ORAL_TABLET | Freq: Every day | ORAL | Status: DC

## 2012-10-31 MED ORDER — FLUTICASONE PROPIONATE 50 MCG/ACT NA SUSP: 2.0000 | NASAL | Status: DC

## 2012-10-31 MED ORDER — ALBUTEROL SULFATE HFA 108 (90 BASE) MCG/ACT IN AERS: 2.0000 | INHALATION_SPRAY | Freq: Four times a day (QID) | RESPIRATORY_TRACT | Status: DC | PRN

## 2012-10-31 MED ORDER — HYDROCODONE-HOMATROPINE 5-1.5 MG/5ML PO SYRP: 5.0000 mL | ORAL_SOLUTION | Freq: Four times a day (QID) | ORAL | Status: DC | PRN

## 2012-10-31 MED ORDER — BENZONATATE 200 MG PO CAPS: 200.0000 mg | ORAL_CAPSULE | Freq: Three times a day (TID) | ORAL | Status: DC

## 2012-10-31 NOTE — Patient Instructions (Addendum)
It was good to see you today. We have reviewed your prior records including labs and tests today We've reviewed all of the causes for your cough in detail today and the treatment for each component Continue the antibiotics as prescribed by Dr. [name] and start daily nose spray (Flonase) for control of sinus symptoms Take Pepcid twice a day everyday to control reflux component Use albuterol inhaler 2 puffs twice a day, Zyrtec tablet every day, and prednisone taper for next 12 days to control allergy component Continued Tessalon 3 times a day before each meal and Hydromet syrup at night to control/suppress cough Your prescription(s) have been submitted to your pharmacy. Please take as directed and contact our office if you believe you are having problem(s) with the medication(s). Please schedule followup in 6-8 weeks for review as needed, call sooner if problems.

## 2012-10-31 NOTE — Progress Notes (Signed)
Subjective:    Patient ID: Dorothy Lee, female    DOB: [birth year], 72 y.o.   MRN: [id number]  Cough This is a chronic (> 3 years) problem. The current episode started more than 1 year ago. The problem has been unchanged. The problem occurs constantly. The cough is non-productive. Associated symptoms include nasal congestion and postnasal drip. Pertinent negatives include no chest pain, ear congestion, ear pain, fever, headaches, hemoptysis, myalgias, rash, sore throat, shortness of breath or wheezing. Weight loss: ? down 9# since last year, but at baseline per review of 2012 weights. The symptoms are aggravated by lying down. 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 She has tried oral steroids, OTC cough suppressant and rest for the symptoms. The treatment provided mild relief. Her past medical history is significant for environmental allergies. There is no history of asthma.    Past Medical History  Diagnosis Date  . History of breast cancer     right - s/p XRT 1984  . CAD (coronary artery disease)     DES RCA and LAD 02/2008  . Hyperlipidemia   . GERD (gastroesophageal reflux disease)   . Urinary incontinence   . Scoliosis     with mild chronic LBP  . Allergic rhinitis   . ALLERGIC RHINITIS   . Cough     chronic  . HYPERTENSION, BORDERLINE   . ROTATOR CUFF SYNDROME, LEFT      Review of Systems  Constitutional: Negative for fever. Weight loss: ? down 9# since last year, but at baseline per review of 2012 weights.  HENT: Positive for postnasal drip. Negative for ear pain and sore throat.   Respiratory: Positive for cough. Negative for hemoptysis, shortness of breath and wheezing.   Cardiovascular: Negative for chest pain.  Musculoskeletal: Negative for myalgias.  Skin: Negative for rash.  Neurological: Negative for headaches.  Hematological: Positive for environmental allergies.       Objective:   Physical  Exam  BP 152/90  Pulse 103  Temp 97.6 F (36.4 C) (Oral)  Wt 149 lb (67.586 kg)  SpO2 95% Weight: 149 lb (67.586 kg)  Wt Readings from Last 3 Encounters:  10/31/12 149 lb (67.586 kg)  09/22/12 149 lb 3.2 oz (67.677 kg)  08/26/12 150 lb 12.8 oz (68.402 kg)   Constitutional: She appears well-developed and well-nourished. No distress. mildly hoarse HENT: Head: Normocephalic and atraumatic. Sinus nontender - max and frontal Ears: B TMs ok, no erythema or effusion; Nose: Nose normal. Mouth/Throat: Oropharynx is red, clear and moist. No oropharyngeal exudate.  Eyes: Conjunctivae and EOM are normal. Pupils are equal, round, and reactive to light. No scleral icterus.  Neck: Normal range of motion. Neck supple. No JVD or LAD present. No thyromegaly present.  Cardiovascular: Normal rate, regular rhythm and normal heart sounds.  No murmur heard. No BLE edema. Pulmonary/Chest: Effort normal and breath sounds normal. No respiratory distress. She has no wheezes.   Lab Results  Component Value Date   WBC 6.6 07/25/2011   HGB 13.9 07/25/2011   HCT 43.2 07/25/2011   PLT 309 07/25/2011   GLUCOSE 116* 07/25/2011   CHOL 164 05/16/2010   HDL 43 05/16/2010   LDLCALC 782 05/16/2010   ALT 22 05/16/2010   AST 21 05/16/2010   NA 137 07/25/2011   K 3.5 07/25/2011   CL 105 07/25/2011   CREATININE 0.79 07/25/2011   BUN 17 07/25/2011   CO2 23 07/25/2011   TSH  0.77 12/07/2008   CXR 07/2011: empysematous and mild bronchitis changes - NAD Dg Chest 2 View  08/27/2012  *RADIOLOGY REPORT*  Clinical Data: Cough, bronchitis.  CHEST - 2 VIEW  Comparison: 07/25/2011  Findings: Vascular clips in the right axilla.  Heart size upper limits normal.  Somewhat attenuated bronchovascular markings especially in the upper lobes.  No confluent infiltrate or overt edema.  No effusion.  Regional bones unremarkable.  IMPRESSION:  1.  No acute disease   Original Report Authenticated By: D. Andria Rhein, MD       Assessment & Plan:    Chronic cough, cyclical component -multiple variable factors contributing to same reviewed -treatment for each component as outlined. Continue cough suppression with narcotic syrup and Tessalon, new refills provided today  Chronic sinusitis. Ongoing evaluation and treatment by ENT. Pending repeat CT sinuses following completion of antibiotic therapy, ongoing. Resume nasal steroid. The refills provided today  Allergic rhinitis. Encouraged the importance of antihistamine use. We'll also provide albuterol inhaler to use twice daily and prednisone taper for 12 days  GERD. Largely asymptomatic but exacerbated by ongoing cough. Reminded of the importance of twice daily H2B as prescribed.Refill on same today  Time spent with pt today 30 minutes, greater than 50% time spent counseling patient on contributors to chronic cough symptoms and medication review. Also review of prior records including recent evaluation by pulmonary/ENT and imaging November 2013 chest x-ray

## 2012-11-24 ENCOUNTER — Other Ambulatory Visit: Payer: Medicare Other

## 2012-11-25 ENCOUNTER — Ambulatory Visit
Admission: RE | Admit: 2012-11-25 | Discharge: 2012-11-25 | Disposition: A | Discharge status: Home / Self Care | Payer: Medicare Other | Source: Ambulatory Visit | Attending: Otolaryngology | Admitting: Otolaryngology

## 2012-11-25 ENCOUNTER — Other Ambulatory Visit: Payer: Medicare Other

## 2012-11-25 DIAGNOSIS — J322 Chronic ethmoidal sinusitis: Secondary | ICD-10-CM | POA: Diagnosis not present

## 2012-11-25 DIAGNOSIS — J328 Other chronic sinusitis: Secondary | ICD-10-CM

## 2012-11-25 DIAGNOSIS — J320 Chronic maxillary sinusitis: Secondary | ICD-10-CM | POA: Diagnosis not present

## 2012-12-01 ENCOUNTER — Encounter (HOSPITAL_COMMUNITY)
Admission: AD | Disposition: A | Discharge status: Home / Self Care | Payer: Self-pay | Source: Home / Self Care | Attending: Cardiology

## 2012-12-01 ENCOUNTER — Emergency Department (HOSPITAL_COMMUNITY): Payer: Medicare Other

## 2012-12-01 ENCOUNTER — Encounter (HOSPITAL_COMMUNITY): Payer: Self-pay | Admitting: Emergency Medicine

## 2012-12-01 ENCOUNTER — Inpatient Hospital Stay (HOSPITAL_COMMUNITY)
Admission: AD | Admit: 2012-12-01 | Discharge: 2012-12-03 | DRG: 247 | Disposition: A | Discharge status: Home / Self Care | Payer: Medicare Other | Attending: Cardiology | Admitting: Cardiology

## 2012-12-01 DIAGNOSIS — Z836 Family history of other diseases of the respiratory system: Secondary | ICD-10-CM

## 2012-12-01 DIAGNOSIS — I200 Unstable angina: Secondary | ICD-10-CM | POA: Diagnosis not present

## 2012-12-01 DIAGNOSIS — E785 Hyperlipidemia, unspecified: Secondary | ICD-10-CM | POA: Diagnosis present

## 2012-12-01 DIAGNOSIS — Z8249 Family history of ischemic heart disease and other diseases of the circulatory system: Secondary | ICD-10-CM

## 2012-12-01 DIAGNOSIS — Z853 Personal history of malignant neoplasm of breast: Secondary | ICD-10-CM | POA: Diagnosis not present

## 2012-12-01 DIAGNOSIS — M4120 Other idiopathic scoliosis, site unspecified: Secondary | ICD-10-CM | POA: Diagnosis present

## 2012-12-01 DIAGNOSIS — Z9861 Coronary angioplasty status: Secondary | ICD-10-CM | POA: Diagnosis not present

## 2012-12-01 DIAGNOSIS — I2510 Atherosclerotic heart disease of native coronary artery without angina pectoris: Principal | ICD-10-CM | POA: Diagnosis present

## 2012-12-01 DIAGNOSIS — K219 Gastro-esophageal reflux disease without esophagitis: Secondary | ICD-10-CM | POA: Diagnosis present

## 2012-12-01 DIAGNOSIS — R079 Chest pain, unspecified: Secondary | ICD-10-CM | POA: Diagnosis not present

## 2012-12-01 DIAGNOSIS — I10 Essential (primary) hypertension: Secondary | ICD-10-CM | POA: Diagnosis not present

## 2012-12-01 DIAGNOSIS — R0789 Other chest pain: Secondary | ICD-10-CM | POA: Diagnosis not present

## 2012-12-01 DIAGNOSIS — I249 Acute ischemic heart disease, unspecified: Secondary | ICD-10-CM

## 2012-12-01 DIAGNOSIS — M67919 Unspecified disorder of synovium and tendon, unspecified shoulder: Secondary | ICD-10-CM | POA: Diagnosis present

## 2012-12-01 DIAGNOSIS — I201 Angina pectoris with documented spasm: Secondary | ICD-10-CM | POA: Diagnosis not present

## 2012-12-01 DIAGNOSIS — M719 Bursopathy, unspecified: Secondary | ICD-10-CM | POA: Diagnosis present

## 2012-12-01 DIAGNOSIS — Z955 Presence of coronary angioplasty implant and graft: Secondary | ICD-10-CM

## 2012-12-01 DIAGNOSIS — Z923 Personal history of irradiation: Secondary | ICD-10-CM

## 2012-12-01 LAB — COMPREHENSIVE METABOLIC PANEL
AST: 18 U/L (ref 0–37)
Albumin: 3.3 g/dL — ABNORMAL LOW (ref 3.5–5.2)
Calcium: 9.3 mg/dL (ref 8.4–10.5)
Chloride: 105 mEq/L (ref 96–112)
Creatinine, Ser: 0.87 mg/dL (ref 0.50–1.10)

## 2012-12-01 LAB — CBC
HCT: 39.9 % (ref 36.0–46.0)
MCH: 30.2 pg (ref 26.0–34.0)
MCV: 88.5 fL (ref 78.0–100.0)
Platelets: 351 10*3/uL (ref 150–400)
RDW: 13.1 % (ref 11.5–15.5)

## 2012-12-01 LAB — CBC WITH DIFFERENTIAL/PLATELET
Basophils Absolute: 0 10*3/uL (ref 0.0–0.1)
Basophils Relative: 0 % (ref 0–1)
Eosinophils Relative: 5 % (ref 0–5)
HCT: 42.8 % (ref 36.0–46.0)
MCHC: 33.2 g/dL (ref 30.0–36.0)
Monocytes Absolute: 0.6 10*3/uL (ref 0.1–1.0)
Neutro Abs: 3.1 10*3/uL (ref 1.7–7.7)
RDW: 13.1 % (ref 11.5–15.5)

## 2012-12-01 LAB — HEMOGLOBIN A1C
Hgb A1c MFr Bld: 6.1 % — ABNORMAL HIGH (ref <5.7)
Mean Plasma Glucose: 128 mg/dL — ABNORMAL HIGH (ref <117)

## 2012-12-01 LAB — D-DIMER, QUANTITATIVE: D-Dimer, Quant: 0.52 ug/mL-FEU — ABNORMAL HIGH (ref 0.00–0.48)

## 2012-12-01 LAB — CK TOTAL AND CKMB (NOT AT ARMC)
CK, MB: 2.5 ng/mL (ref 0.3–4.0)
Total CK: 40 U/L (ref 7–177)

## 2012-12-01 SURGERY — LEFT HEART CATHETERIZATION WITH CORONARY ANGIOGRAM
Anesthesia: Moderate Sedation

## 2012-12-01 MED ORDER — ASPIRIN 300 MG RE SUPP
300.0000 mg | RECTAL | Status: DC
  Filled 2012-12-01: qty 1

## 2012-12-01 MED ORDER — HEPARIN SODIUM (PORCINE) 5000 UNIT/ML IJ SOLN
5000.0000 [IU] | Freq: Three times a day (TID) | INTRAMUSCULAR | Status: DC
  Administered 2012-12-01 – 2012-12-02 (×2): 5000 [IU] via SUBCUTANEOUS
  Filled 2012-12-01 (×6): qty 1

## 2012-12-01 MED ORDER — ALPRAZOLAM 0.25 MG PO TABS: 0.2500 mg | ORAL_TABLET | Freq: Two times a day (BID) | ORAL | Status: DC | PRN

## 2012-12-01 MED ORDER — LISINOPRIL 10 MG PO TABS
10.0000 mg | ORAL_TABLET | Freq: Every day | ORAL | Status: DC
  Filled 2012-12-01: qty 1

## 2012-12-01 MED ORDER — NITROGLYCERIN IN D5W 200-5 MCG/ML-% IV SOLN
INTRAVENOUS | Status: AC
  Administered 2012-12-01: 10 ug via INTRAVENOUS
  Filled 2012-12-01: qty 250

## 2012-12-01 MED ORDER — FLUTICASONE PROPIONATE 50 MCG/ACT NA SUSP
1.0000 | Freq: Every day | NASAL | Status: DC | PRN
  Filled 2012-12-01: qty 16

## 2012-12-01 MED ORDER — SODIUM CHLORIDE 0.9 % IV SOLN: 250.0000 mL | INTRAVENOUS | Status: DC | PRN

## 2012-12-01 MED ORDER — SODIUM CHLORIDE 0.9 % IV BOLUS (SEPSIS)
1000.0000 mL | Freq: Once | INTRAVENOUS | Status: AC
  Administered 2012-12-01: 1000 mL via INTRAVENOUS

## 2012-12-01 MED ORDER — ASPIRIN EC 81 MG PO TBEC: 81.0000 mg | DELAYED_RELEASE_TABLET | Freq: Every day | ORAL | Status: DC

## 2012-12-01 MED ORDER — ONDANSETRON HCL 4 MG/2ML IJ SOLN
4.0000 mg | Freq: Four times a day (QID) | INTRAMUSCULAR | Status: DC | PRN
  Administered 2012-12-02: 4 mg via INTRAVENOUS
  Filled 2012-12-01: qty 2

## 2012-12-01 MED ORDER — NITROGLYCERIN IN D5W 200-5 MCG/ML-% IV SOLN: 3.0000 ug/min | INTRAVENOUS | Status: DC

## 2012-12-01 MED ORDER — SODIUM CHLORIDE 0.9 % IJ SOLN: 3.0000 mL | INTRAMUSCULAR | Status: DC | PRN

## 2012-12-01 MED ORDER — SODIUM CHLORIDE 0.9 % IJ SOLN: 3.0000 mL | Freq: Two times a day (BID) | INTRAMUSCULAR | Status: DC

## 2012-12-01 MED ORDER — SODIUM CHLORIDE 0.9 % IV SOLN
INTRAVENOUS | Status: DC
  Administered 2012-12-01: 10 mL/h via INTRAVENOUS

## 2012-12-01 MED ORDER — FAMOTIDINE 20 MG PO TABS
20.0000 mg | ORAL_TABLET | Freq: Two times a day (BID) | ORAL | Status: DC
  Administered 2012-12-01 – 2012-12-03 (×4): 20 mg via ORAL
  Filled 2012-12-01 (×5): qty 1

## 2012-12-01 MED ORDER — CLOPIDOGREL BISULFATE 75 MG PO TABS
75.0000 mg | ORAL_TABLET | Freq: Every morning | ORAL | Status: DC
  Administered 2012-12-02: 75 mg via ORAL
  Filled 2012-12-01 (×2): qty 1

## 2012-12-01 MED ORDER — DIAZEPAM 5 MG PO TABS
5.0000 mg | ORAL_TABLET | ORAL | Status: AC
  Administered 2012-12-02: 5 mg via ORAL
  Filled 2012-12-01: qty 1

## 2012-12-01 MED ORDER — ASPIRIN EC 81 MG PO TBEC
81.0000 mg | DELAYED_RELEASE_TABLET | Freq: Every morning | ORAL | Status: DC
  Administered 2012-12-02 – 2012-12-03 (×2): 81 mg via ORAL
  Filled 2012-12-01 (×2): qty 1

## 2012-12-01 MED ORDER — ONDANSETRON HCL 4 MG/2ML IJ SOLN: 4.0000 mg | Freq: Four times a day (QID) | INTRAMUSCULAR | Status: DC | PRN

## 2012-12-01 MED ORDER — ASPIRIN 81 MG PO CHEW: 324.0000 mg | CHEWABLE_TABLET | ORAL | Status: DC

## 2012-12-01 MED ORDER — ACETAMINOPHEN 325 MG PO TABS: 650.0000 mg | ORAL_TABLET | ORAL | Status: DC | PRN

## 2012-12-01 MED ORDER — LISINOPRIL 5 MG PO TABS
5.0000 mg | ORAL_TABLET | Freq: Every day | ORAL | Status: DC
  Administered 2012-12-01 – 2012-12-03 (×3): 5 mg via ORAL
  Filled 2012-12-01 (×3): qty 1

## 2012-12-01 MED ORDER — ASPIRIN 325 MG PO TABS
325.0000 mg | ORAL_TABLET | Freq: Once | ORAL | Status: AC
  Administered 2012-12-01: 325 mg via ORAL
  Filled 2012-12-01: qty 1

## 2012-12-01 MED ORDER — ALBUTEROL SULFATE HFA 108 (90 BASE) MCG/ACT IN AERS
2.0000 | INHALATION_SPRAY | Freq: Four times a day (QID) | RESPIRATORY_TRACT | Status: DC | PRN
  Filled 2012-12-01: qty 6.7

## 2012-12-01 MED ORDER — LORATADINE 10 MG PO TABS
10.0000 mg | ORAL_TABLET | Freq: Every day | ORAL | Status: DC
  Administered 2012-12-02 – 2012-12-03 (×2): 10 mg via ORAL
  Filled 2012-12-01 (×3): qty 1

## 2012-12-01 MED ORDER — ATORVASTATIN CALCIUM 10 MG PO TABS
10.0000 mg | ORAL_TABLET | Freq: Every day | ORAL | Status: DC
  Administered 2012-12-02: 10 mg via ORAL
  Filled 2012-12-01 (×3): qty 1

## 2012-12-01 MED ORDER — NITROGLYCERIN 0.4 MG SL SUBL: 0.4000 mg | SUBLINGUAL_TABLET | SUBLINGUAL | Status: DC | PRN

## 2012-12-01 MED ORDER — ASPIRIN 81 MG PO CHEW
324.0000 mg | CHEWABLE_TABLET | ORAL | Status: AC
  Administered 2012-12-02: 324 mg via ORAL
  Filled 2012-12-01: qty 4

## 2012-12-01 MED ORDER — METOPROLOL TARTRATE 12.5 MG HALF TABLET
12.5000 mg | ORAL_TABLET | Freq: Two times a day (BID) | ORAL | Status: DC
  Administered 2012-12-01 – 2012-12-03 (×4): 12.5 mg via ORAL
  Filled 2012-12-01 (×5): qty 1

## 2012-12-01 MED ORDER — SODIUM CHLORIDE 0.9 % IV SOLN
INTRAVENOUS | Status: DC
  Administered 2012-12-02: 1000 mL via INTRAVENOUS

## 2012-12-01 MED ORDER — ACETAMINOPHEN 325 MG PO TABS
650.0000 mg | ORAL_TABLET | ORAL | Status: DC | PRN
  Administered 2012-12-02: 650 mg via ORAL
  Filled 2012-12-01: qty 2

## 2012-12-01 MED ORDER — RALOXIFENE HCL 60 MG PO TABS
60.0000 mg | ORAL_TABLET | ORAL | Status: DC
  Administered 2012-12-03: 60 mg via ORAL
  Filled 2012-12-01 (×2): qty 1

## 2012-12-01 MED ORDER — ACETAMINOPHEN 325 MG PO TABS
650.0000 mg | ORAL_TABLET | ORAL | Status: DC | PRN
  Administered 2012-12-01: 650 mg via ORAL
  Filled 2012-12-01: qty 2

## 2012-12-01 MED ORDER — ZOLPIDEM TARTRATE 5 MG PO TABS
5.0000 mg | ORAL_TABLET | Freq: Every evening | ORAL | Status: DC | PRN
  Administered 2012-12-01 – 2012-12-02 (×2): 5 mg via ORAL
  Filled 2012-12-01 (×2): qty 1

## 2012-12-01 NOTE — ED Provider Notes (Addendum)
History     CSN: [id number]  Arrival date & time 12/01/12  1100   First MD Initiated Contact with Patient 12/01/12 1102      Chief Complaint  Patient presents with  . Chest Pain    (Consider location/radiation/quality/duration/timing/severity/associated sxs/prior treatment) The history is provided by the patient.  Dorothy Lee is a 72 y.o. female hx of CAD with 2 stents, HL, here with chest pain. Substernal chest pain for 3 days, worse with exertion and with laying down. Radiate up to L jaw and down L arm. Took 2 nitro yesterday and felt better. Today, the pain was worse and she took asa 81 mg and 1 nitro now pain free but still have a sense of pressure. No SOB or recent travel. No hx of PE/DVT. No hx of CHF.    Past Medical History  Diagnosis Date  . History of breast cancer     right - s/p XRT 1984  . CAD (coronary artery disease)     DES RCA and LAD 02/2008  . Hyperlipidemia   . GERD (gastroesophageal reflux disease)   . Urinary incontinence   . Scoliosis     with mild chronic LBP  . Allergic rhinitis   . ALLERGIC RHINITIS   . Cough     chronic  . HYPERTENSION, BORDERLINE   . ROTATOR CUFF SYNDROME, LEFT     Past Surgical History  Procedure Laterality Date  . Appendectomy  1981  . Coronary angioplasty with stent placement  02/2008  . Breast biopsy  1984  . Breast lumpectomy  1982    right breast    Family History  Problem Relation Age of Onset  . Emphysema Father   . Emphysema Brother   . Heart disease Brother     x3 brothers  . Lung cancer Brother   . Throat cancer Brother     History  Substance Use Topics  . Smoking status: Never Smoker   . Smokeless tobacco: Not on file     Comment: Widowed, lives alone but has sig other.   . Alcohol Use: Yes     Comment: social alcohol > wine 2x week    OB History   Grav Para Term Preterm Abortions TAB SAB Ect Mult Living                  Review of Systems  Cardiovascular: Positive for chest pain.  All  other systems reviewed and are negative.    Allergies  Review of patient's allergies indicates no known allergies.  Home Medications   Current Outpatient Rx  Name  Route  Sig  Dispense  Refill  . albuterol (PROVENTIL HFA;VENTOLIN HFA) 108 (90 BASE) MCG/ACT inhaler   Inhalation   Inhale 2 puffs into the lungs every 6 (six) hours as needed for wheezing.   18 g   1   . aspirin EC 81 MG tablet   Oral   Take 81 mg by mouth every morning.         . cetirizine (ZYRTEC) 10 MG tablet   Oral   Take 10 mg by mouth every morning.         . clopidogrel (PLAVIX) 75 MG tablet   Oral   Take 75 mg by mouth every morning.          . famotidine (PEPCID) 20 MG tablet   Oral   Take 1 tablet (20 mg total) by mouth 2 (two) times daily.   60 tablet  1   . fluticasone (FLONASE) 50 MCG/ACT nasal spray   Nasal   Place 2 sprays into the nose daily as needed for rhinitis or allergies.         . nitroGLYCERIN (NITROSTAT) 0.4 MG SL tablet   Sublingual   Place 0.4 mg under the tongue every 5 (five) minutes x 3 doses as needed for chest pain. May repeat x3         . raloxifene (EVISTA) 60 MG tablet   Oral   Take 60 mg by mouth every other day.          . rosuvastatin (CRESTOR) 10 MG tablet   Oral   Take 10 mg by mouth every morning.            BP 162/81  Pulse 88  Temp(Src) 98.2 F (36.8 C) (Oral)  Resp 16  SpO2 100%  Physical Exam  Nursing note and vitals reviewed. Constitutional: She is oriented to person, place, and time. She appears well-developed and well-nourished.  Comfortable   HENT:  Head: Normocephalic.  Mouth/Throat: Oropharynx is clear and moist.  Eyes: Conjunctivae are normal. Pupils are equal, round, and reactive to light.  Neck: Normal range of motion. Neck supple.  Cardiovascular: Normal rate, regular rhythm and normal heart sounds.   Pulmonary/Chest: Effort normal and breath sounds normal. No respiratory distress. She has no wheezes. She has no  rales.  Abdominal: Soft. Bowel sounds are normal. She exhibits no distension. There is no tenderness. There is no rebound.  Musculoskeletal: Normal range of motion. She exhibits no edema and no tenderness.  Neurological: She is alert and oriented to person, place, and time.  Skin: Skin is warm and dry.  Psychiatric: She has a normal mood and affect. Her behavior is normal. Judgment and thought content normal.    ED Course  Procedures (including critical care time)  CRITICAL CARE Performed by: Silverio Lay, Nadav Swindell   Total critical care time: 30 min   Critical care time was exclusive of separately billable procedures and treating other patients.  Critical care was necessary to treat or prevent imminent or life-threatening deterioration.  Critical care was time spent personally by me on the following activities: development of treatment plan with patient and/or surrogate as well as nursing, discussions with consultants, evaluation of patient's response to treatment, examination of patient, obtaining history from patient or surrogate, ordering and performing treatments and interventions, ordering and review of laboratory studies, ordering and review of radiographic studies, pulse oximetry and re-evaluation of patient's condition.   Labs Reviewed  COMPREHENSIVE METABOLIC PANEL - Abnormal; Notable for the following:    Glucose, Bld 168 (*)    Albumin 3.3 (*)    GFR calc non Af Amer 65 (*)    GFR calc Af Amer 75 (*)    All other components within normal limits  D-DIMER, QUANTITATIVE - Abnormal; Notable for the following:    D-Dimer, Quant 0.52 (*)    All other components within normal limits  CBC WITH DIFFERENTIAL  TROPONIN I   Dg Chest 2 View  12/01/2012  *RADIOLOGY REPORT*  Clinical Data: Chest pain  CHEST - 2 VIEW  Comparison: 08/27/2012  Findings: Lungs are essentially clear.  No focal consolidation.  No pleural effusion or pneumothorax.  Cardiomediastinal silhouette is within normal limits.   Mild degenerative changes of the visualized thoracolumbar spine.  Postsurgical changes along the right lateral chest wall / axilla.  IMPRESSION: No evidence of acute cardiopulmonary disease.   Original Report  Authenticated By: Charline Bills, M.D.      No diagnosis found.   Date: 12/01/2012  Rate: 110  Rhythm: sinus tachycardia  QRS Axis: left  Intervals: normal  ST/T Wave abnormalities: nonspecific ST changes  Conduction Disutrbances:none  Narrative Interpretation:   Old EKG Reviewed: unchanged    MDM  Dorothy Lee is a 72 y.o. female here with chest pain. Concerning for ACS. Will get trop. Will call cardiology for eval given hx of CAD. Low risk for PE but patient tachycardic. Will get d-dimer.   12:42 PM Trop neg x 1. CXR nl. D-dimer 0.52, slightly elevated compared to normal value. But using the age-adjusted value of 0.7, she is below the threshold. I discussed with Dr. Norris Cross partner, who wants to see patient first. Will hold off of CT angio chest because she might need an angiogram. Will transfer the patient to [facility] for possible cath. I am concerned of possible unstable angina. She is given ASA in the ED. I held off of heparin drip given trop neg x 1.         Richardean Canal, MD 12/01/12 1244  Richardean Canal, MD 12/02/12 [phone]

## 2012-12-01 NOTE — ED Notes (Signed)
PT states she has been have chest pain for past 2 days.  Pt states pain was constant, stopped with nitroglycerin.  Pt states pain stopped an hour ago after taking 1 nitroglycerin tab, pt states she took 81mg .  Pt describes pain as pressure radiating to L neck with weakness and diaphoresis.  Pt states she also has a cough

## 2012-12-01 NOTE — H&P (Signed)
Admit date: 12/01/2012 Referring Physician Dr. Silverio Lay Primary Cardiologist Dr. Mayford Knife Chief complaint/reason for admission: chest discomfort  HPI: 72 y/o with a strong family h/o CAD.  She had 2 vessel stenting in 2009 with DES.  Over the past few weeks, she has had some intermittent chest tightness, at times associated with walking.  Over the past three days, she has had some chest discomfort at rest that she thought may be reflu.  SHe was not sure so she took some NTG and had temporary relief.  WHen she had her stents placed, she had no sx.  She has not been exercising over the past 2 years due to respiratory issues.  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  She has taken prednisone for respiratory issues and this has led to some chest discmfort at times.    She currently has some mild chest discomfort.  Her BP is quite high.  SHe usually has low BP at home.  She has not required anti-HTN meds in the past.      PMH:    Past Medical History  Diagnosis Date  . History of breast cancer     right - s/p XRT 1984  . CAD (coronary artery disease)     DES RCA and LAD 02/2008  . Hyperlipidemia   . GERD (gastroesophageal reflux disease)   . Urinary incontinence   . Scoliosis     with mild chronic LBP  . Allergic rhinitis   . ALLERGIC RHINITIS   . Cough     chronic  . HYPERTENSION, BORDERLINE   . ROTATOR CUFF SYNDROME, LEFT     PSH:    Past Surgical History  Procedure Laterality Date  . Appendectomy  1981  . Coronary angioplasty with stent placement  02/2008  . Breast biopsy  1984  . Breast lumpectomy  1982    right breast    ALLERGIES:   Review of patient's allergies indicates no known allergies.  Prior to Admit Meds:   Prescriptions prior to admission  Medication Sig Dispense Refill  . albuterol (PROVENTIL HFA;VENTOLIN HFA) 108 (90 BASE) MCG/ACT inhaler Inhale 2 puffs into the lungs every 6 (six) hours as needed for wheezing.  18  g  1  . aspirin EC 81 MG tablet Take 81 mg by mouth every morning.      . cetirizine (ZYRTEC) 10 MG tablet Take 10 mg by mouth every morning.      . clopidogrel (PLAVIX) 75 MG tablet Take 75 mg by mouth every morning.       . famotidine (PEPCID) 20 MG tablet Take 1 tablet (20 mg total) by mouth 2 (two) times daily.  60 tablet  1  . fluticasone (FLONASE) 50 MCG/ACT nasal spray Place 2 sprays into the nose daily as needed for rhinitis or allergies.      . nitroGLYCERIN (NITROSTAT) 0.4 MG SL tablet Place 0.4 mg under the tongue every 5 (five) minutes x 3 doses as needed for chest pain. May repeat x3      . raloxifene (EVISTA) 60 MG tablet Take 60 mg by mouth every other day.       . rosuvastatin (CRESTOR) 10 MG tablet Take 10 mg by mouth every morning.        Family HX:    Family History  Problem Relation Age of Onset  . Emphysema Father   . Emphysema Brother   . Heart disease Brother  x3 brothers  . Lung cancer Brother   . Throat cancer Brother    Social HX:    History   Social History  . Marital Status: Divorced    Spouse Name: N/A    Number of Children: N/A  . Years of Education: N/A   Occupational History  . hair stylist    Social History Main Topics  . Smoking status: Never Smoker   . Smokeless tobacco: Not on file     Comment: Widowed, lives alone but has sig other.   . Alcohol Use: Yes     Comment: social alcohol > wine 2x week  . Drug Use: No  . Sexually Active: Not on file   Other Topics Concern  . 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 Heart:   HRRR S1 S2 Pulses are 2+ & equal.            No carotid bruit. No JVD.   Abdomen: Bowel sounds are positive, abdomen soft and non-tender Msk:  Back normal, normal gait. Normal strength and tone for age. Extremities:   No  edema.   Neuro: Alert and oriented X 3. Psych:  Good affect, responds appropriately   Labs:   Lab Results  Component Value Date   WBC 5.2 12/01/2012   HGB 14.2 12/01/2012   HCT 42.8 12/01/2012   MCV 89.2 12/01/2012   PLT 381 12/01/2012    Recent Labs Lab 12/01/12 1126  NA 139  K 3.6  CL 105  CO2 25  BUN 12  CREATININE 0.87  CALCIUM 9.3  PROT 6.4  BILITOT 0.3  ALKPHOS 92  ALT 13  AST 18  GLUCOSE 168*   Lab Results  Component Value Date   TROPONINI <0.30 12/01/2012   No results found for this basename: PTT   No results found for this basename: INR, PROTIME     Lab Results  Component Value Date   CHOL 164 05/16/2010   CHOL 154 11/15/2009   CHOL 154 06/01/2009   Lab Results  Component Value Date   HDL 43 05/16/2010   HDL 47 11/15/2009   HDL 47 06/01/2009   Lab Results  Component Value Date   LDLCALC 106 05/16/2010   LDLCALC 92 11/15/2009   LDLCALC 92 06/01/2009   No results found for this basename: TRIG   No results found for this basename: CHOLHDL   No results found for this basename: LDLDIRECT      Radiology: No acute cardiopulmonary disease  EKG:  NSR, no ST segment changes  ASSESSMENT: UNstable angina, tachycardia, elevated BP  PLAN:  Rule out for MI with enzymes.  Telemetry.  No heparin for now given negative enzymes and high BP.  She had a stress test in 2012 which was negative.  Given recurrent sx at stress, would consider cath.  Explained to patient and she is agreeable.  Presented with tacycardia.  ER MD felt PE was unlikely.  HR normalized with IV fluid.  Continue to watch.  Will add low dose beta blocker.    Elevated BP.  NTG gtt started.   BP better, but patient has a severe headache.  WIll add lisinopril to help with BP.  Unclear if this is going to be a long term medication.    Corky Crafts., MD  12/01/2012  5:26 PM

## 2012-12-01 NOTE — ED Notes (Signed)
[facility] here for transport. Report given

## 2012-12-02 ENCOUNTER — Encounter (HOSPITAL_COMMUNITY)
Admission: AD | Disposition: A | Discharge status: Home / Self Care | Payer: Self-pay | Source: Home / Self Care | Attending: Cardiology

## 2012-12-02 ENCOUNTER — Encounter (HOSPITAL_COMMUNITY): Payer: Self-pay | Admitting: Surgery

## 2012-12-02 DIAGNOSIS — R079 Chest pain, unspecified: Secondary | ICD-10-CM | POA: Diagnosis not present

## 2012-12-02 DIAGNOSIS — I201 Angina pectoris with documented spasm: Secondary | ICD-10-CM | POA: Diagnosis not present

## 2012-12-02 DIAGNOSIS — I2510 Atherosclerotic heart disease of native coronary artery without angina pectoris: Secondary | ICD-10-CM | POA: Diagnosis not present

## 2012-12-02 DIAGNOSIS — I200 Unstable angina: Secondary | ICD-10-CM | POA: Diagnosis not present

## 2012-12-02 HISTORY — PX: LEFT HEART CATHETERIZATION WITH CORONARY ANGIOGRAM: SHX5451

## 2012-12-02 HISTORY — PX: PERCUTANEOUS CORONARY STENT INTERVENTION (PCI-S): SHX5485

## 2012-12-02 LAB — LIPID PANEL
Cholesterol: 174 mg/dL (ref 0–200)
HDL: 36 mg/dL — ABNORMAL LOW (ref >39)
Total CHOL/HDL Ratio: 4.8 RATIO
Triglycerides: 153 mg/dL — ABNORMAL HIGH (ref <150)

## 2012-12-02 LAB — TROPONIN I: Troponin I: <0.3 ng/mL (ref <0.30)

## 2012-12-02 LAB — CK TOTAL AND CKMB (NOT AT ARMC)
CK, MB: 2.4 ng/mL (ref 0.3–4.0)
CK, MB: 2.3 ng/mL (ref 0.3–4.0)
Relative Index: INVALID (ref 0.0–2.5)
Total CK: 40 U/L (ref 7–177)

## 2012-12-02 LAB — BASIC METABOLIC PANEL
Chloride: 105 mEq/L (ref 96–112)
GFR calc Af Amer: >90 mL/min (ref >90)
Potassium: 3.8 mEq/L (ref 3.5–5.1)

## 2012-12-02 SURGERY — LEFT HEART CATHETERIZATION WITH CORONARY ANGIOGRAM
Anesthesia: LOCAL

## 2012-12-02 MED ORDER — ASPIRIN 81 MG PO CHEW
CHEWABLE_TABLET | ORAL | Status: AC
  Filled 2012-12-02: qty 3

## 2012-12-02 MED ORDER — HEPARIN (PORCINE) IN NACL 2-0.9 UNIT/ML-% IJ SOLN
INTRAMUSCULAR | Status: AC
  Filled 2012-12-02: qty 1000

## 2012-12-02 MED ORDER — ENSURE COMPLETE PO LIQD: 237.0000 mL | ORAL | Status: DC | PRN

## 2012-12-02 MED ORDER — CLOPIDOGREL BISULFATE 75 MG PO TABS
75.0000 mg | ORAL_TABLET | Freq: Every day | ORAL | Status: DC
  Administered 2012-12-03: 75 mg via ORAL
  Filled 2012-12-02 (×2): qty 1

## 2012-12-02 MED ORDER — NITROGLYCERIN 1 MG/10 ML FOR IR/CATH LAB
INTRA_ARTERIAL | Status: AC
  Filled 2012-12-02: qty 10

## 2012-12-02 MED ORDER — SODIUM CHLORIDE 0.9 % IV SOLN: INTRAVENOUS | Status: AC

## 2012-12-02 MED ORDER — FENTANYL CITRATE 0.05 MG/ML IJ SOLN
INTRAMUSCULAR | Status: AC
  Filled 2012-12-02: qty 2

## 2012-12-02 MED ORDER — MIDAZOLAM HCL 2 MG/2ML IJ SOLN
INTRAMUSCULAR | Status: AC
  Filled 2012-12-02: qty 2

## 2012-12-02 MED ORDER — ASPIRIN 81 MG PO CHEW: 81.0000 mg | CHEWABLE_TABLET | Freq: Every day | ORAL | Status: DC

## 2012-12-02 MED ORDER — BIVALIRUDIN 250 MG IV SOLR
INTRAVENOUS | Status: AC
  Filled 2012-12-02: qty 250

## 2012-12-02 MED ORDER — ONDANSETRON HCL 4 MG/2ML IJ SOLN: 4.0000 mg | Freq: Four times a day (QID) | INTRAMUSCULAR | Status: DC | PRN

## 2012-12-02 MED ORDER — LIDOCAINE HCL (PF) 1 % IJ SOLN
INTRAMUSCULAR | Status: AC
  Filled 2012-12-02: qty 30

## 2012-12-02 MED ORDER — ACETAMINOPHEN 325 MG PO TABS
650.0000 mg | ORAL_TABLET | ORAL | Status: DC | PRN
  Administered 2012-12-02 – 2012-12-03 (×2): 650 mg via ORAL
  Filled 2012-12-02 (×2): qty 2

## 2012-12-02 MED ORDER — CLOPIDOGREL BISULFATE 300 MG PO TABS
ORAL_TABLET | ORAL | Status: AC
  Filled 2012-12-02: qty 1

## 2012-12-02 NOTE — Care Management Note (Signed)
    Page 1 of 1   12/02/2012     7:48:15 AM   CARE MANAGEMENT NOTE 12/02/2012  Patient:  Dorothy Lee, Dorothy Lee   Account Number:  [id number]  Date Initiated:  12/02/2012  Documentation initiated by:  Junius Creamer  Subjective/Objective Assessment:   adm w ch pain, htn     Action/Plan:   lives alone, pcp dr Rene Paci   Anticipated DC Date:     Anticipated DC Plan:        DC Planning Services  CM consult      Choice offered to / List presented to:             Status of service:   Medicare Important Message given?   (If response is "NO", the following Medicare IM given date fields will be blank) Date Medicare IM given:   Date Additional Medicare IM given:    Discharge Disposition:    Per UR Regulation:  Reviewed for med. necessity/level of care/duration of stay  If discussed at Long Length of Stay Meetings, dates discussed:    Comments:  2/25 0747 debbie Donnalyn Juran rn,bsn

## 2012-12-02 NOTE — Progress Notes (Signed)
INITIAL NUTRITION ASSESSMENT  DOCUMENTATION CODES Per approved criteria  -Not Applicable   INTERVENTION: 1. Ensure Complete po PRN, each supplement provides 350 kcal and 13 grams of protein. 2. RD will continue to follow     NUTRITION DIAGNOSIS: Inadequate oral intake related to chronic cough as evidenced by weight loss.   Goal: PO intake to meet >/=90% estimated nutrition needs  Monitor:  PO intake, weight trends, I/O's  Reason for Assessment: Malnutrition Screening Tool  72 y.o. female  Admitting Dx: Chest discomfort   ASSESSMENT: Pt admitted with chest tightness.  Pt reports a hx of weight loss, unable to eat r/t ongoing cough. States 16 lb weight loss in the past 2-3 months, weight hx shows 10 lbs in 3 months, 6.7% body weight not significant.  Pt is willing to drink Ensure as needed for poor meal completion.   Height: Ht Readings from Last 1 Encounters:  12/02/12 5\' 3"  (1.6 m)    Weight: Wt Readings from Last 1 Encounters:  12/02/12 140 lb 10.5 oz (63.8 kg)    Ideal Body Weight: 115 lbs   % Ideal Body Weight: 121 lbs   Wt Readings from Last 10 Encounters:  12/02/12 140 lb 10.5 oz (63.8 kg)  12/02/12 140 lb 10.5 oz (63.8 kg)  12/02/12 140 lb 10.5 oz (63.8 kg)  10/31/12 149 lb (67.586 kg)  09/22/12 149 lb 3.2 oz (67.677 kg)  08/26/12 150 lb 12.8 oz (68.402 kg)  07/21/12 150 lb 12.8 oz (68.402 kg)  11/05/11 158 lb (71.668 kg)  06/05/11 152 lb 9.6 oz (69.219 kg)  05/22/11 152 lb 3.2 oz (69.037 kg)    Usual Body Weight: 156 lbs per pt report   % Usual Body Weight: 89%  BMI:  Body mass index is 24.92 kg/(m^2). WNL  Estimated Nutritional Needs: Kcal: 1350-1550 Protein: 65-75 gm  Fluid: 1.4-1/6 L/day  Skin: intact   Diet Order: NPO  EDUCATION NEEDS: -No education needs identified at this time   Intake/Output Summary (Last 24 hours) at 12/02/12 0940 Last data filed at 12/02/12 0900  Gross per 24 hour  Intake 281.65 ml  Output      0 ml   Net 281.65 ml    Last BM: PTA   Labs:   Recent Labs Lab 12/01/12 1126 12/01/12 1738 12/02/12 0645  NA 139  --  139  K 3.6  --  3.8  CL 105  --  105  CO2 25  --  25  BUN 12  --  12  CREATININE 0.87 0.76 0.75  CALCIUM 9.3  --  8.9  GLUCOSE 168*  --  95    CBG (last 3)  No results found for this basename: GLUCAP,  in the last 72 hours  Scheduled Meds: . aspirin  324 mg Oral NOW   Or  . aspirin  300 mg Rectal NOW  . aspirin EC  81 mg Oral q morning - 10a  . atorvastatin  10 mg Oral q1800  . clopidogrel  75 mg Oral q morning - 10a  . diazepam  5 mg Oral On Call  . famotidine  20 mg Oral BID  . heparin  5,000 Units Subcutaneous Q8H  . lisinopril  5 mg Oral Daily  . loratadine  10 mg Oral Daily  . metoprolol tartrate  12.5 mg Oral BID  . raloxifene  60 mg Oral QODAY  . sodium chloride  3 mL Intravenous Q12H    Continuous Infusions: . sodium chloride 10  mL/hr (12/01/12 1836)  . sodium chloride 1,000 mL (12/02/12 0750)  . nitroGLYCERIN 5 mcg/min (12/02/12 0919)    Past Medical History  Diagnosis Date  . History of breast cancer     right - s/p XRT 1984  . CAD (coronary artery disease)     DES RCA and LAD 02/2008  . Hyperlipidemia   . GERD (gastroesophageal reflux disease)   . Urinary incontinence   . Scoliosis     with mild chronic LBP  . Allergic rhinitis   . ALLERGIC RHINITIS   . Cough     chronic  . HYPERTENSION, BORDERLINE   . ROTATOR CUFF SYNDROME, LEFT     Past Surgical History  Procedure Laterality Date  . Appendectomy  1981  . Coronary angioplasty with stent placement  02/2008  . Breast biopsy  1984  . Breast lumpectomy  1982    right breast    Clarene Duke RD, LDN Pager [phone] After Hours pager [phone]

## 2012-12-02 NOTE — CV Procedure (Signed)
PROCEDURE:  Left heart catheterization with selective coronary angiography, left ventriculogram.  INDICATIONS:    The risks, benefits, and details of the procedure were explained to the patient.  The patient verbalized understanding and wanted to proceed.  Informed written consent was obtained.  PROCEDURE TECHNIQUE:  After Xylocaine anesthesia a 26F sheath was placed in the right femoral artery with a single anterior needle wall stick.   Left coronary angiography was done using a Judkins L4 guide catheter.  Right coronary angiography was done using a Judkins R4 guide catheter.  Left ventriculography was done using a pigtail catheter.    CONTRAST:  Total of 75 cc.  COMPLICATIONS:  None.    HEMODYNAMICS:  Aortic pressure was 138/63mmHg; LV pressure was 149/58mmHg; LVEDP .  There was no gradient between the left ventricle and aorta.    ANGIOGRAPHIC DATA:   The left main coronary artery is widely patent.  It bifurcates into an LAD and left circumflex artery.  The left anterior descending artery is widely patent.  There is a stent in the proximal portion which is patent.  It gives rise to a small diagonal 1 and then a second small diagonal branch.  After the takeoff of the second diagonal there is an 80% stenosis of the mid LAD.  The ongoing LAD is patent.  The left circumflex artery is widely patent.  It gives rise to a large OM1 and moderate sized OM2 which are widely patent.  The ongoing left circ traverses the AV groove and is patent.  The right coronary artery is widely patent.   There is a long stent in the proximal and mid RCA which are patent.  The RCA gives rise to 2 moderate sized acute RV marginal branches which are patent.    LEFT VENTRICULOGRAM:  Left ventricular angiogram was done in the 30 RAO projection and revealed normal left ventricular wall motion and systolic function with an estimated ejection fraction of 65%.  LVEDP was 26 mmHg.  IMPRESSIONS:  1. Normal left main  coronary artery. 2. Patent proximal LAD stent with 80% mid LAF stenosis. 3. Normal left circumflex artery and its branches. 4. Patent RCA stents 5. Normal left ventricular systolic function.  LVEDP 26 mmHg.  Ejection fraction 65%.  RECOMMENDATION:  . 1.  PCI of LAD per Dr. Eldridge Dace 2.  Continue with ASA/Plavix/crestor

## 2012-12-02 NOTE — Interval H&P Note (Signed)
History and Physical Interval Note:  12/02/2012 11:25 AM  Dorothy Lee  has presented today for surgery, with the diagnosis of Chest pain  The various methods of treatment have been discussed with the patient and family. After consideration of risks, benefits and other options for treatment, the patient has consented to  Procedure(s): LEFT HEART CATHETERIZATION WITH CORONARY ANGIOGRAM (N/A) as a surgical intervention .  The patient's history has been reviewed, patient examined, no change in status, stable for surgery.  I have reviewed the patient's chart and labs.  Questions were answered to the patient's satisfaction.     Rozalia Dino R

## 2012-12-02 NOTE — CV Procedure (Signed)
PROCEDURE:  PCI LAD  INDICATIONS:  Unstable angina  The risks, benefits, and details of the procedure were explained to the patient.  The patient verbalized understanding and wanted to proceed.  Informed written consent was obtained.  PROCEDURE TECHNIQUE:  Dr. Mayford Knife performed the diagnostic cath revealing a 90% diffuse mid LAD lesion.  The sheath was changed to 6 [place].  Angiomax was used for anticoagulation.  An ACT was used to check that the Angiomax is therapeutic.   CONTRAST:  Total of 90 cc.  COMPLICATIONS:  None.      ANGIOGRAPHIC DATA:     The left anterior descending artery is a large vessel proximally.  The distal vessel is small.  There is a diffuse 90% mid LAD lesion.   PCI NARRATIVE:  An XB LAD 3.0 was used to engage the left main.  A prowater wire was placed across the area of disease in the mid LAD.  A 2.0 x 20 Emerge balloon was used to predilate.  There was significant vasospasm noted with wiring the vessel.  This resolved with intracoronary nitroglycerin.  A 2.25 x 32 Promus drug-eluting stent was then deployed overlapping the previously placed stent.  The stent was postdilated with a 2.5 balloon in the mid and a 3.0 balloon proximally.    Several more doses of intracoronary nitroglycerin were administered.  There is an excellent angiographic result.  TIMI-3 flow was maintained throughout.  A mynx device was used for hemostasis.  There is a question of a small hematoma below the access site.  A FemoStop was placed.  IMPRESSIONS:  1. Successful drug-eluting stent placement to the mid left anterior descending artery, overlapping the distal edge of the previously placed Promus stent, with a 2.25 x 32 Promus stent.  This was postdilated to 2.5 mm distally and to 3.0 mm proximally.  RECOMMENDATION:  Continue dual antiplatelet therapy for at least a year.  She'll need aggressive secondary prevention.  Possible discharge tomorrow if no further groin issues.  She will followup  with Dr. Mayford Knife.

## 2012-12-02 NOTE — Progress Notes (Signed)
Pt to cath lab at this time

## 2012-12-03 DIAGNOSIS — R079 Chest pain, unspecified: Secondary | ICD-10-CM | POA: Diagnosis not present

## 2012-12-03 DIAGNOSIS — I2510 Atherosclerotic heart disease of native coronary artery without angina pectoris: Secondary | ICD-10-CM | POA: Diagnosis not present

## 2012-12-03 DIAGNOSIS — I200 Unstable angina: Secondary | ICD-10-CM | POA: Diagnosis not present

## 2012-12-03 LAB — BASIC METABOLIC PANEL
BUN: 15 mg/dL (ref 6–23)
CO2: 27 mEq/L (ref 19–32)
Calcium: 8.7 mg/dL (ref 8.4–10.5)
Creatinine, Ser: 0.91 mg/dL (ref 0.50–1.10)
Glucose, Bld: 90 mg/dL (ref 70–99)

## 2012-12-03 LAB — CBC
HCT: 38.3 % (ref 36.0–46.0)
Hemoglobin: 12.8 g/dL (ref 12.0–15.0)
MCH: 29.8 pg (ref 26.0–34.0)
MCV: 89.1 fL (ref 78.0–100.0)
RBC: 4.3 MIL/uL (ref 3.87–5.11)

## 2012-12-03 MED ORDER — LISINOPRIL 5 MG PO TABS: 5.0000 mg | ORAL_TABLET | Freq: Every day | ORAL | Status: DC

## 2012-12-03 MED ORDER — METOPROLOL TARTRATE 12.5 MG HALF TABLET: 12.5000 mg | ORAL_TABLET | Freq: Two times a day (BID) | ORAL | Status: DC

## 2012-12-03 MED FILL — Dextrose Inj 5%: INTRAVENOUS | Qty: 50 | Status: AC

## 2012-12-03 NOTE — Progress Notes (Signed)
DC instructions given to pt re:  F/u appts, home meds, new prescriptions, activity, diet, s/s of problems to report to md, community resources, and my chart.  No s/s of any distress at dc.  Right groin vascular site remains level 1.  No c/o cp.

## 2012-12-03 NOTE — Progress Notes (Signed)
CARDIAC REHAB PHASE I   PRE:  Rate/Rhythm: 89SR  BP:  Supine: 123/50  Sitting:   Standing:    SaO2: 97%RA   MODE:  Ambulation: 700 ft   POST:  Rate/Rhythem: 100 SR  BP:  Supine:   Sitting: 139/48  Standing:    SaO2: 98%RA 0855-1000 Pt walked 700 ft with steady gait. Tolerated well. Denied CP. Education completed. Permission given to refer to [place] Phase 2. To bed after walk.  Dorothy Lee

## 2012-12-06 NOTE — Discharge Summary (Addendum)
Patient ID: Dorothy Lee MRN: [id number] DOB/AGE: 05-06-1941 72 y.o.  Admit date: 12/01/2012 Discharge date: 12/03/2012  Primary Discharge Diagnosis  Coronary Artery Disease s/p PCI of the mid LAD  Secondary Discharge Diagnosis  CAD s/p remote PCI of RCA and LAD 02/2008  Breast CA s/p XRT  Hyperlipidemia  GERD  Urinary incontinence  Scoliosis  Allergic rhinitis  Chronic cough  HTN  Rotator cuff syndrome  Significant Diagnostic Studies:PROCEDURE: Left heart catheterization with selective coronary angiography, left ventriculogram.  INDICATIONS:  The risks, benefits, and details of the procedure were explained to the patient. The patient verbalized understanding and wanted to proceed. Informed written consent was obtained.  PROCEDURE TECHNIQUE: After Xylocaine anesthesia a 60F sheath was placed in the right femoral artery with a single anterior needle wall stick. Left coronary angiography was done using a Judkins L4 guide catheter. Right coronary angiography was done using a Judkins R4 guide catheter. Left ventriculography was done using a pigtail catheter.  CONTRAST: Total of 75 cc.  COMPLICATIONS: None.  HEMODYNAMICS: Aortic pressure was 138/79mmHg; LV pressure was 149/60mmHg; LVEDP . There was no gradient between the left ventricle and aorta.  ANGIOGRAPHIC DATA: The left main coronary artery is widely patent. It bifurcates into an LAD and left circumflex artery.  The left anterior descending artery is widely patent. There is a stent in the proximal portion which is patent. It gives rise to a small diagonal 1 and then a second small diagonal branch. After the takeoff of the second diagonal there is an 80% stenosis of the mid LAD. The ongoing LAD is patent.  The left circumflex artery is widely patent. It gives rise to a large OM1 and moderate sized OM2 which are widely patent. The ongoing left circ traverses the AV groove and is patent.  The right coronary artery is widely patent.  There is a long stent in the proximal and mid RCA which are patent. The RCA gives rise to 2 moderate sized acute RV marginal branches which are patent.  LEFT VENTRICULOGRAM: Left ventricular angiogram was done in the 30 RAO projection and revealed normal left ventricular wall motion and systolic function with an estimated ejection fraction of 65%. LVEDP was 26 mmHg.  IMPRESSIONS:  1. Normal left main coronary artery. 2. Patent proximal LAD stent with 80% mid LAF stenosis. 3. Normal left circumflex artery and its branches. 4. Patent RCA stents 5. Normal left ventricular systolic function. LVEDP 26 mmHg. Ejection fraction 65%. RECOMMENDATION: .  1. PCI of LAD per Dr. Eldridge Dace  2. Continue with ASA/Plavix/crestor  PROCEDURE: PCI LAD  INDICATIONS: Unstable angina  The risks, benefits, and details of the procedure were explained to the patient. The patient verbalized understanding and wanted to proceed. Informed written consent was obtained.  PROCEDURE TECHNIQUE: Dr. Mayford Knife performed the diagnostic cath revealing a 90% diffuse mid LAD lesion. The sheath was changed to 6 [place]. Angiomax was used for anticoagulation. An ACT was used to check that the Angiomax is therapeutic.  CONTRAST: Total of 90 cc.  COMPLICATIONS: None.  ANGIOGRAPHIC DATA:  The left anterior descending artery is a large vessel proximally. The distal vessel is small. There is a diffuse 90% mid LAD lesion.  PCI NARRATIVE: An XB LAD 3.0 was used to engage the left main. A prowater wire was placed across the area of disease in the mid LAD. A 2.0 x 20 Emerge balloon was used to predilate. There was significant vasospasm noted with wiring the vessel. This resolved with intracoronary nitroglycerin.  A 2.25 x 32 Promus drug-eluting stent was then deployed overlapping the previously placed stent. The stent was postdilated with a 2.5 balloon in the mid and a 3.0 balloon proximally. Several more doses of intracoronary nitroglycerin were  administered. There is an excellent angiographic result. TIMI-3 flow was maintained throughout. A mynx device was used for hemostasis. There is a question of a small hematoma below the access site. A FemoStop was placed.  IMPRESSIONS:  6. Successful drug-eluting stent placement to the mid left anterior descending artery, overlapping the distal edge of the previously placed Promus stent, with a 2.25 x 32 Promus stent. This was postdilated to 2.5 mm distally and to 3.0 mm proximally. RECOMMENDATION: Continue dual antiplatelet therapy for at least a year. She'll need aggressive secondary prevention. Possible discharge tomorrow if no further groin issues. She will followup with Dr. Mayford Knife.         Consults: NONE  Hospital Course: 72 y/o with a strong family h/o CAD. She had 2 vessel stenting in 2009 with DES. Over the past few weeks, she has had some intermittent chest tightness, at times associated with walking. Over the past three days, she has had some chest discomfort at rest that she thought may be reflu. SHe was not sure so she took some NTG and had temporary relief. WHen she had her stents placed, she had no sx. She has not been exercising over the past 2 years due to respiratory issues. 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 She has taken prednisone for respiratory issues and this has led to some chest discmfort at times. She was admitted and underwent PCI of the LAD distal to prior LAD stent.  SHe subsequently underwent PCI of the distal LAD without complications.  She did well and on the day of discharge was ambulating without difficulty.     Discharge Exam:   WD, WN WF in NAD HEENT:  Benign LUNGS:  CTA bilaterally COR:  RRR no M/R/G ABD:  Soft NT, ND normal BS EXT:  No C/E/E with stable cath site    Labs:   Lab Results  Component Value Date   WBC 6.3 12/03/2012   HGB 12.8 12/03/2012   HCT 38.3 12/03/2012   MCV 89.1 12/03/2012    PLT 349 12/03/2012    Recent Labs Lab 12/01/12 1126  12/03/12 0500  NA 139  < > 139  K 3.6  < > 3.8  CL 105  < > 106  CO2 25  < > 27  BUN 12  < > 15  CREATININE 0.87  < > 0.91  CALCIUM 9.3  < > 8.7  PROT 6.4  --   --   BILITOT 0.3  --   --   ALKPHOS 92  --   --   ALT 13  --   --   AST 18  --   --   GLUCOSE 168*  < > 90  < > = values in this interval not displayed. Lab Results  Component Value Date   CKTOTAL 34 12/02/2012   CKMB 2.3 12/02/2012   TROPONINI <0.30 12/02/2012    Lab Results  Component Value Date   CHOL 174 12/02/2012   CHOL 164 05/16/2010   CHOL 154 11/15/2009   Lab Results  Component Value Date   HDL 36* 12/02/2012   HDL 43 05/16/2010   HDL 47 11/15/2009   Lab Results  Component Value Date  LDLCALC 107* 12/02/2012   LDLCALC 106 05/16/2010   LDLCALC 92 11/15/2009   Lab Results  Component Value Date   TRIG 153* 12/02/2012   Lab Results  Component Value Date   CHOLHDL 4.8 12/02/2012   No results found for this basename: LDLDIRECT      Radiology: *RADIOLOGY REPORT*  Clinical Data: Chest pain  CHEST - 2 VIEW  Comparison: 08/27/2012  Findings: Lungs are essentially clear. No focal consolidation. No  pleural effusion or pneumothorax.  Cardiomediastinal silhouette is within normal limits.  Mild degenerative changes of the visualized thoracolumbar spine.  Postsurgical changes along the right lateral chest wall / axilla.  IMPRESSION:  No evidence of acute cardiopulmonary disease.  Original Report Authenticated By: Charline Bills, M.D.  EKG:NSR  FOLLOW UP PLANS AND APPOINTMENTS Discharge Orders   Future Orders Complete By Expires     Amb Referral to Cardiac Rehabilitation  As directed     Diet - low sodium heart healthy  As directed     Increase activity slowly  As directed     Lifting restrictions  As directed     Comments:      No lifting more than 10 pounds for 1 week        Medication List    TAKE these medications       albuterol 108 (90  BASE) MCG/ACT inhaler  Commonly known as:  PROVENTIL HFA;VENTOLIN HFA  Inhale 2 puffs into the lungs every 6 (six) hours as needed for wheezing.     aspirin EC 81 MG tablet  Take 81 mg by mouth every morning.     cetirizine 10 MG tablet  Commonly known as:  ZYRTEC  Take 10 mg by mouth every morning.     clopidogrel 75 MG tablet  Commonly known as:  PLAVIX  Take 75 mg by mouth every morning.     famotidine 20 MG tablet  Commonly known as:  PEPCID  Take 1 tablet (20 mg total) by mouth 2 (two) times daily.     fluticasone 50 MCG/ACT nasal spray  Commonly known as:  FLONASE  Place 2 sprays into the nose daily as needed for rhinitis or allergies.     lisinopril 5 MG tablet  Commonly known as:  PRINIVIL,ZESTRIL  Take 1 tablet (5 mg total) by mouth daily.     metoprolol tartrate 12.5 mg Tabs  Commonly known as:  LOPRESSOR  Take 0.5 tablets (12.5 mg total) by mouth 2 (two) times daily.     nitroGLYCERIN 0.4 MG SL tablet  Commonly known as:  NITROSTAT  Place 0.4 mg under the tongue every 5 (five) minutes x 3 doses as needed for chest pain. May repeat x3     raloxifene 60 MG tablet  Commonly known as:  EVISTA  Take 60 mg by mouth every other day.     rosuvastatin 10 MG tablet  Commonly known as:  CRESTOR  Take 10 mg by mouth every morning.           Follow-up Information   Follow up with Quintella Reichert, MD On 12/08/2012. (at 9:30am)    Contact information:   [address] [place] [address] [phone]       BRING ALL MEDICATIONS WITH YOU TO FOLLOW UP APPOINTMENTS  Time spent with patient to include physician time: 35 minutes Signed: Quintella Reichert 12/06/2012, 5:46 PM

## 2012-12-08 DIAGNOSIS — I2510 Atherosclerotic heart disease of native coronary artery without angina pectoris: Secondary | ICD-10-CM | POA: Diagnosis not present

## 2012-12-08 DIAGNOSIS — I10 Essential (primary) hypertension: Secondary | ICD-10-CM | POA: Diagnosis not present

## 2012-12-08 DIAGNOSIS — E7800 Pure hypercholesterolemia, unspecified: Secondary | ICD-10-CM | POA: Diagnosis not present

## 2012-12-10 ENCOUNTER — Ambulatory Visit (INDEPENDENT_AMBULATORY_CARE_PROVIDER_SITE_OTHER)
Admission: RE | Admit: 2012-12-10 | Discharge: 2012-12-10 | Disposition: A | Discharge status: Home / Self Care | Payer: Medicare Other | Source: Ambulatory Visit | Attending: Internal Medicine | Admitting: Internal Medicine

## 2012-12-10 ENCOUNTER — Ambulatory Visit (INDEPENDENT_AMBULATORY_CARE_PROVIDER_SITE_OTHER): Payer: Medicare Other | Admitting: Internal Medicine

## 2012-12-10 ENCOUNTER — Encounter: Payer: Self-pay | Admitting: Internal Medicine

## 2012-12-10 VITALS — BP 132/82 | HR 91 | Temp 98.3°F

## 2012-12-10 DIAGNOSIS — I2510 Atherosclerotic heart disease of native coronary artery without angina pectoris: Secondary | ICD-10-CM | POA: Diagnosis not present

## 2012-12-10 DIAGNOSIS — M25511 Pain in right shoulder: Secondary | ICD-10-CM

## 2012-12-10 DIAGNOSIS — M25519 Pain in unspecified shoulder: Secondary | ICD-10-CM

## 2012-12-10 DIAGNOSIS — M25512 Pain in left shoulder: Secondary | ICD-10-CM

## 2012-12-10 DIAGNOSIS — M19019 Primary osteoarthritis, unspecified shoulder: Secondary | ICD-10-CM | POA: Diagnosis not present

## 2012-12-10 MED ORDER — TRAMADOL HCL 50 MG PO TABS: 50.0000 mg | ORAL_TABLET | Freq: Three times a day (TID) | ORAL | Status: DC | PRN

## 2012-12-10 NOTE — Assessment & Plan Note (Addendum)
02/2008 PCI of RCA and LAD 12/01/12 PCI of mid LAD distal to prior stent Reviewed Medical management ongoing, no recurrent angina symptoms (reports manifestation with GERD symptoms)

## 2012-12-10 NOTE — Patient Instructions (Signed)
It was good to see you today. We have reviewed your prior records including labs and tests today Test(s) ordered today. Your results will be released to MyChart (or called to you) after review, usually within 72hours after test completion. If any changes need to be made, you will be notified at that same time. Use tramadol as needed for pain or tylenol - Your prescription(s) have been submitted to your pharmacy. Please take as directed and contact our office if you believe you are having problem(s) with the medication(s). we'll make referral to orthopedist for treatment of your shoulder . Our office will contact you regarding appointment(s) once made.

## 2012-12-10 NOTE — Progress Notes (Signed)
  Subjective:    Patient ID: Dorothy Lee, female    DOB: 07-Sep-1941, 72 y.o.   MRN: [id number]  HPI  complains of B shoulder pain Onset >6 weeks ago - at completion of last pred taper Denies injury or trauma History of same on left side related to overuse No prior injections, surgery or orthopedic evaluation for same Symptoms exacerbated with use overhead   Past Medical History  Diagnosis Date  . History of breast cancer     right - s/p XRT 1984  . CAD (coronary artery disease)     DES RCA and LAD 02/2008  . Hyperlipidemia   . GERD (gastroesophageal reflux disease)   . Urinary incontinence   . Scoliosis     with mild chronic LBP  . Allergic rhinitis   . ALLERGIC RHINITIS   . Cough     chronic  . HYPERTENSION, BORDERLINE   . ROTATOR CUFF SYNDROME, LEFT     Review of Systems  HENT: Negative for neck pain and neck stiffness.   Musculoskeletal: Positive for arthralgias. Negative for back pain and joint swelling.  Neurological: Negative for tremors, weakness and numbness.       Objective:   Physical Exam BP 132/82  Pulse 91  Temp(Src) 98.3 F (36.8 C) (Oral)  SpO2 97% Wt Readings from Last 3 Encounters:  12/02/12 140 lb 10.5 oz (63.8 kg)  12/02/12 140 lb 10.5 oz (63.8 kg)  12/02/12 140 lb 10.5 oz (63.8 kg)   Constitutional: She appears well-developed and well-nourished. No distress.   Neck: Normal range of motion. Neck supple. No JVD present. No thyromegaly present.  Cardiovascular: Normal rate, regular rhythm and normal heart sounds.  No murmur heard. No BLE edema. Pulmonary/Chest: Effort normal and breath sounds normal. No respiratory distress. She has no wheezes.  Musculoskeletal: B Shoulder: Full range of motion. Neurovascularly intact distally. Good strength with stress of rotator cuff but causes pain. Positive impingement signs. Psychiatric: She has a normal mood and affect. Her behavior is normal. Judgment and thought content normal.   Lab Results   Component Value Date   WBC 6.3 12/03/2012   HGB 12.8 12/03/2012   HCT 38.3 12/03/2012   PLT 349 12/03/2012   GLUCOSE 90 12/03/2012   CHOL 174 12/02/2012   TRIG 153* 12/02/2012   HDL 36* 12/02/2012   LDLCALC 107* 12/02/2012   ALT 13 12/01/2012   AST 18 12/01/2012   NA 139 12/03/2012   K 3.8 12/03/2012   CL 106 12/03/2012   CREATININE 0.91 12/03/2012   BUN 15 12/03/2012   CO2 27 12/03/2012   TSH 0.77 12/07/2008   INR 0.99 12/01/2012   HGBA1C 6.1* 12/01/2012       Assessment & Plan:   B shoulder pain - suspect rotator cuff tendinitis/impingement exacerbated by lack of anti-inflammatory as off prednisone since late December 2013. -No trauma or fall reported. Ligamentous function intact. We'll check plain film bilateral shoulders to look for spur or other arthritic change. Hold on anti-inflammatory use given recent stent. Recommend tramadol and Tylenol for pain and refer to orthopedics to consider injection if needed

## 2012-12-18 DIAGNOSIS — M19019 Primary osteoarthritis, unspecified shoulder: Secondary | ICD-10-CM | POA: Diagnosis not present

## 2012-12-25 DIAGNOSIS — E7800 Pure hypercholesterolemia, unspecified: Secondary | ICD-10-CM | POA: Diagnosis not present

## 2012-12-25 DIAGNOSIS — Z79899 Other long term (current) drug therapy: Secondary | ICD-10-CM | POA: Diagnosis not present

## 2013-01-01 ENCOUNTER — Other Ambulatory Visit: Payer: Self-pay | Admitting: Internal Medicine

## 2013-01-05 ENCOUNTER — Telehealth (HOSPITAL_COMMUNITY): Payer: Self-pay | Admitting: *Deleted

## 2013-02-04 ENCOUNTER — Other Ambulatory Visit: Payer: Self-pay | Admitting: Internal Medicine

## 2013-02-11 ENCOUNTER — Telehealth: Payer: Self-pay | Admitting: *Deleted

## 2013-02-11 MED ORDER — HYDROCODONE-HOMATROPINE 5-1.5 MG/5ML PO SYRP: 5.0000 mL | ORAL_SOLUTION | Freq: Four times a day (QID) | ORAL | Status: DC | PRN

## 2013-02-11 MED ORDER — BENZONATATE 200 MG PO CAPS: 200.0000 mg | ORAL_CAPSULE | Freq: Three times a day (TID) | ORAL | Status: DC

## 2013-02-11 NOTE — Telephone Encounter (Signed)
Pt informed of rx for Dorothy Lee states that she is currently taking these from previous rx TID-they are not helping her with her cough at night-pt requesting additional medication.

## 2013-02-11 NOTE — Telephone Encounter (Signed)
Pt requesting rx for cough medication-she has continuous cough that keeps her up a night-(pt has appointment scheduled for Monday).

## 2013-02-11 NOTE — Telephone Encounter (Signed)
hydromet  

## 2013-02-11 NOTE — Telephone Encounter (Signed)
Tessalon prn - erx done

## 2013-02-12 NOTE — Telephone Encounter (Signed)
Called pt to inform, no answer/unable to leave message.

## 2013-02-12 NOTE — Telephone Encounter (Signed)
Rx called into [facility] pharmacy for Hydromet cough syrup, called pt to inform, no answer/unable to leave message.

## 2013-02-12 NOTE — Telephone Encounter (Signed)
Pt informed rx sent in for Hycodan cough syrup.

## 2013-02-16 ENCOUNTER — Ambulatory Visit (INDEPENDENT_AMBULATORY_CARE_PROVIDER_SITE_OTHER): Payer: Medicare Other | Admitting: Internal Medicine

## 2013-02-16 ENCOUNTER — Encounter: Payer: Self-pay | Admitting: Internal Medicine

## 2013-02-16 VITALS — BP 132/80 | HR 72 | Temp 97.5°F | Wt 145.0 lb

## 2013-02-16 DIAGNOSIS — I2510 Atherosclerotic heart disease of native coronary artery without angina pectoris: Secondary | ICD-10-CM

## 2013-02-16 DIAGNOSIS — I10 Essential (primary) hypertension: Secondary | ICD-10-CM

## 2013-02-16 DIAGNOSIS — R05 Cough: Secondary | ICD-10-CM

## 2013-02-16 MED ORDER — PREDNISONE (PAK) 10 MG PO TABS: 10.0000 mg | ORAL_TABLET | ORAL | Status: DC

## 2013-02-16 MED ORDER — AMOXICILLIN-POT CLAVULANATE 875-125 MG PO TABS: 1.0000 | ORAL_TABLET | Freq: Two times a day (BID) | ORAL | Status: AC

## 2013-02-16 NOTE — Assessment & Plan Note (Signed)
Started on ACEI 11/2012 by cards -  Stop same due to cough side effects potential  consider ARB if BP uncontrolled off meds  BP Readings from Last 3 Encounters:  02/16/13 132/80  12/10/12 132/82  12/03/12 143/57

## 2013-02-16 NOTE — Assessment & Plan Note (Signed)
Chronic symptoms - known acute and chronic sinusitis per CT sinuses August 2012 Reported normal spirometry and chest x-ray February 2012  October 2012 chest x-ray with emphysematous changes but full PFTs 11/2011 WNL Recurrent symptoms in past 7 days - on H2B, antihistamine -- and ACEI since late 11/2012 per cards.. use Pred Pak and albuterol inhaler when necessary now Stop ACEI and change to ARB reviewed pulmonary eval 09/2012 and/or ENT depending on outcome and response to therapy

## 2013-02-16 NOTE — Patient Instructions (Signed)
It was good to see you today. We have reviewed your prior records including labs and tests today Stop lisinopril, the blood pressure medication, as this medication side effects can include dry cough Treat with Augmentin antibiotics and prednisone taper for your sinus and allergy symptoms - Your prescription(s) have been submitted to your pharmacy. Please take as directed and contact our office if you believe you are having problem(s) with the medication(s). Continue daily nose spray (Flonase) and daily antihistamine for control of sinus symptoms Take Pepcid twice a day everyday to control potential reflux component Continued Tessalon 3 times a day before each meal and Hydromet syrup at night to control/suppress cough

## 2013-02-16 NOTE — Progress Notes (Signed)
  Subjective:    Patient ID: Dorothy Lee, female    DOB: 1941-02-03, 72 y.o.   MRN: [id number]  HPI  Here for follow up  See PA student note  Past Medical History  Diagnosis Date  . History of breast cancer     right - s/p XRT 1984  . CAD (coronary artery disease)     DES RCA and LAD 02/2008  . Hyperlipidemia   . GERD (gastroesophageal reflux disease)   . Urinary incontinence   . Scoliosis     with mild chronic LBP  . Allergic rhinitis   . ALLERGIC RHINITIS   . Cough     chronic  . HYPERTENSION, BORDERLINE   . ROTATOR CUFF SYNDROME, LEFT      Review of Systems      Objective:   Physical Exam   BP 132/80  Pulse 72  Temp(Src) 97.5 F (36.4 C) (Oral)  Wt 145 lb (65.772 kg)  BMI 25.69 kg/m2  SpO2 95% Weight: 145 lb (65.772 kg)  Constitutional: She appears well-developed and well-nourished. No distress. mildly hoarse HENT: Head: Normocephalic and atraumatic. Sinus nontender - max and frontal Ears: B TMs ok, no erythema or effusion; Nose: Nose normal.  Mouth/Throat: Oropharynx is clear and moist. No oropharyngeal exudate.  Eyes: Conjunctivae and EOM are normal. Pupils are equal, round, and reactive to light. No scleral icterus.  Neck: Normal range of motion. Neck supple. No JVD present. No thyromegaly present.  Cardiovascular: Normal rate, regular rhythm and normal heart sounds.  No murmur heard. No BLE edema. Pulmonary/Chest: Effort normal and breath sounds normal. No respiratory distress. She has no wheezes.   Lab Results  Component Value Date   WBC 6.3 12/03/2012   HGB 12.8 12/03/2012   HCT 38.3 12/03/2012   PLT 349 12/03/2012   GLUCOSE 90 12/03/2012   CHOL 174 12/02/2012   TRIG 153* 12/02/2012   HDL 36* 12/02/2012   LDLCALC 107* 12/02/2012   ALT 13 12/01/2012   AST 18 12/01/2012   NA 139 12/03/2012   K 3.8 12/03/2012   CL 106 12/03/2012   CREATININE 0.91 12/03/2012   BUN 15 12/03/2012   CO2 27 12/03/2012   TSH 0.77 12/07/2008   INR 0.99 12/01/2012   HGBA1C 6.1*  12/01/2012       Assessment & Plan:   See problem list. Medications and labs reviewed today.

## 2013-02-16 NOTE — Assessment & Plan Note (Signed)
02/2008 PCI of RCA and LAD 12/01/12 PCI of mid LAD distal to prior stent Reviewed Medical management ongoing, stop ACEI as begun 11/2012 because of recurrent cough - consider ARB once cough resolves no recurrent angina symptoms (reports typical manifestation with GERD symptoms)

## 2013-02-16 NOTE — Progress Notes (Signed)
Subjective:    Patient ID: Dorothy Lee, female    DOB: 13-Feb-1941, 72 y.o.   MRN: [id number]  HPI  Patient presents to the office today for follow-up of chronic medical conditions:  Cough:  Cough has been doing very well up until a week ago.  Patient states that she has had a difficult time sleeping, and has been bringing up a thick white sputum.  She has tried Delsym during the day at home, claritin, and pepcid with little to no relief at home.  Patient uses hydromet at night with significant relief.  She has also been using her inhaler every night for the past week.  Shoulder Pain:  Patient saw Dr. Dion Saucier and had injections done bilaterally.  She states that she is currently pain free.    Past Medical History  Diagnosis Date  . History of breast cancer     right - s/p XRT 1984  . CAD (coronary artery disease)     DES RCA and LAD 02/2008  . Hyperlipidemia   . GERD (gastroesophageal reflux disease)   . Urinary incontinence   . Scoliosis     with mild chronic LBP  . Allergic rhinitis   . ALLERGIC RHINITIS   . Cough     chronic  . HYPERTENSION, BORDERLINE   . ROTATOR CUFF SYNDROME, LEFT    Family History  Problem Relation Age of Onset  . Emphysema Father   . Emphysema Brother   . Heart disease Brother     x3 brothers  . Lung cancer Brother   . Throat cancer Brother        Review of Systems  Constitutional: Positive for fatigue. Negative for fever and chills.  HENT: Positive for congestion and postnasal drip. Negative for ear pain and rhinorrhea.   Respiratory: Positive for cough and chest tightness. Negative for shortness of breath and wheezing.   Cardiovascular: Negative for chest pain and palpitations.  All other systems reviewed and are negative.       Objective:   Physical Exam  Constitutional: She is oriented to person, place, and time. She appears well-developed and well-nourished. No distress.  HENT:  Head: Normocephalic and atraumatic.  Nose: Mucosal  edema present.  Mouth/Throat: No oropharyngeal exudate.  Eyes: Conjunctivae are normal. No scleral icterus.  Neck: Normal range of motion.  Cardiovascular: Normal rate, regular rhythm and normal heart sounds.  Exam reveals no gallop and no friction rub.   No murmur heard. Pulmonary/Chest: Effort normal. No respiratory distress. She has wheezes. She has no rales. She exhibits no tenderness.  Musculoskeletal: Normal range of motion.  Lymphadenopathy:    She has no cervical adenopathy.  Neurological: She is alert and oriented to person, place, and time.  Skin: Skin is warm and dry. She is not diaphoretic.  Psychiatric: She has a normal mood and affect. Her behavior is normal.   Filed Vitals:   02/16/13 1457  BP: 132/80  Pulse: 72  Temp: 97.5 F (36.4 C)  TempSrc: Oral  Weight: 145 lb (65.772 kg)  SpO2: 95%   BP Readings from Last 3 Encounters:  02/16/13 132/80  12/10/12 132/82  12/03/12 143/57    Lab Results  Component Value Date   WBC 6.3 12/03/2012   HGB 12.8 12/03/2012   HCT 38.3 12/03/2012   PLT 349 12/03/2012   GLUCOSE 90 12/03/2012   CHOL 174 12/02/2012   TRIG 153* 12/02/2012   HDL 36* 12/02/2012   LDLCALC 107* 12/02/2012   ALT 13  12/01/2012   AST 18 12/01/2012   NA 139 12/03/2012   K 3.8 12/03/2012   CL 106 12/03/2012   CREATININE 0.91 12/03/2012   BUN 15 12/03/2012   CO2 27 12/03/2012   TSH 0.77 12/07/2008   INR 0.99 12/01/2012   HGBA1C 6.1* 12/01/2012          Assessment & Plan:  Patient presents to the office today for follow-up of chronic medical conditions:  1.  Cough:  Likely related to allergic bronchitis/allergic rhinitis vs. Sinusitis.  Will prescribe Augmentin and prednisone taper.  Patient currently taking lisinopril after stent placed in heart for CAD.  Will discontinue lisinopril today and change to an ARB if BP uncontrolled.  2.  Bilateral Shoulder pain:  Patient states that pain in shoulders has currently resolved.  She will contact Dr. Dion Saucier if recurrence  of symptoms.  Patient to return to the office in 2-4 weeks to recheck BP and reassess chronic cough.        I have personally reviewed this case with PA student. I also personally examined this patient. I agree with history and findings as documented above. I reviewed, discussed and approve of the assessment and plan as listed above. Rene Paci, MD

## 2013-02-17 ENCOUNTER — Other Ambulatory Visit: Payer: Self-pay | Admitting: Internal Medicine

## 2013-02-17 NOTE — Telephone Encounter (Signed)
Faxed script back to [facility]...lmb 

## 2013-02-23 ENCOUNTER — Telehealth: Payer: Self-pay | Admitting: *Deleted

## 2013-02-23 DIAGNOSIS — K219 Gastro-esophageal reflux disease without esophagitis: Secondary | ICD-10-CM

## 2013-02-23 DIAGNOSIS — R05 Cough: Secondary | ICD-10-CM

## 2013-02-23 NOTE — Telephone Encounter (Signed)
Pt states she still having the cough symptoms. Hard to swallow at times. Been dealing with this for about 3 years now. ? If she has GERD. Wanting to get a referral to have a endoscopy done...Raechel Chute

## 2013-02-23 NOTE — Telephone Encounter (Signed)
Notified pt with md response.../lmb 

## 2013-02-23 NOTE — Telephone Encounter (Signed)
GI refer done

## 2013-02-24 ENCOUNTER — Encounter: Payer: Self-pay | Admitting: Gastroenterology

## 2013-02-25 ENCOUNTER — Other Ambulatory Visit: Payer: Self-pay | Admitting: Internal Medicine

## 2013-02-26 NOTE — Telephone Encounter (Signed)
Faxed script back to [facility]...lmb 

## 2013-03-04 ENCOUNTER — Other Ambulatory Visit: Payer: Self-pay | Admitting: Internal Medicine

## 2013-03-04 DIAGNOSIS — L570 Actinic keratosis: Secondary | ICD-10-CM | POA: Diagnosis not present

## 2013-03-04 DIAGNOSIS — L989 Disorder of the skin and subcutaneous tissue, unspecified: Secondary | ICD-10-CM | POA: Diagnosis not present

## 2013-03-04 DIAGNOSIS — L821 Other seborrheic keratosis: Secondary | ICD-10-CM | POA: Diagnosis not present

## 2013-03-04 DIAGNOSIS — I781 Nevus, non-neoplastic: Secondary | ICD-10-CM | POA: Diagnosis not present

## 2013-03-04 DIAGNOSIS — Z808 Family history of malignant neoplasm of other organs or systems: Secondary | ICD-10-CM | POA: Diagnosis not present

## 2013-03-04 DIAGNOSIS — D239 Other benign neoplasm of skin, unspecified: Secondary | ICD-10-CM | POA: Diagnosis not present

## 2013-03-04 DIAGNOSIS — L988 Other specified disorders of the skin and subcutaneous tissue: Secondary | ICD-10-CM | POA: Diagnosis not present

## 2013-03-04 DIAGNOSIS — D485 Neoplasm of uncertain behavior of skin: Secondary | ICD-10-CM | POA: Diagnosis not present

## 2013-03-09 ENCOUNTER — Ambulatory Visit (INDEPENDENT_AMBULATORY_CARE_PROVIDER_SITE_OTHER): Payer: Medicare Other | Admitting: Gastroenterology

## 2013-03-09 ENCOUNTER — Encounter: Payer: Self-pay | Admitting: Gastroenterology

## 2013-03-09 VITALS — BP 148/84 | HR 84 | Ht 63.0 in | Wt 145.6 lb

## 2013-03-09 DIAGNOSIS — R05 Cough: Secondary | ICD-10-CM | POA: Diagnosis not present

## 2013-03-09 DIAGNOSIS — K219 Gastro-esophageal reflux disease without esophagitis: Secondary | ICD-10-CM

## 2013-03-09 MED ORDER — DEXLANSOPRAZOLE 60 MG PO CPDR: 60.0000 mg | DELAYED_RELEASE_CAPSULE | Freq: Every day | ORAL | Status: DC

## 2013-03-09 NOTE — Patient Instructions (Addendum)
You will be due for a recall colonoscopy in 11-2016. We will send you a reminder in the mail when it gets closer to that time.  We have given you samples of the following medication to take: Dexilant 60 mg, please take one capsule by mouth thirty minutes before breakfast once daily. Prescription was sent for Dexilant to your pharmacy as well.  Please take Pepcid/famotidine 40 mg at night, between dinner and bedtime.  Antireflux measures sheet given today for your review. Please elevate head of your bed with blocks.  Please follow up in 2-3 months with Dr. Russella Dar.   Thank you for choosing [facility] and Dr. Russella Dar

## 2013-03-09 NOTE — Progress Notes (Signed)
History of Present Illness: This is a 72 year old female previously followed by Dr. Carman Ching. She underwent upper endoscopy in February 2008 which was entirely normal. She also underwent colonoscopy in February 2008 showing only internal hemorrhoids. She relates a 2 year history of recurrent cough. Her cough temporarily improves with antibiotics and prednisone. She is followed by Dr. Shelle Iron. She has has an ENT evaluation by Dr. [name] who was apparently diagnosed recurrent sinusitis. She has very rare episodes of heartburn and these symptoms are under excellent control on famotidine twice a day. She endorses ongoing hoarseness. She has an around-the-clock cough that is not changed with meals or recumbency. She notes occasional liquids and solids sticking in her throat but no convincing symptoms for esophageal dysphagia. She localizes these symptoms to her mid and upper neck. Denies weight loss, abdominal pain, constipation, diarrhea, change in stool caliber, melena, hematochezia, nausea, vomiting, chest pain.  Review of Systems: Pertinent positive and negative review of systems were noted in the above HPI section. All other review of systems were otherwise negative.  Current Medications, Allergies, Past Medical History, Past Surgical History, Family History and Social History were reviewed in [employer] record.  Physical Exam: General: Well developed , well nourished, no acute distress Head: Normocephalic and atraumatic Eyes:  sclerae anicteric, EOMI Ears: Normal auditory acuity Mouth: No deformity or lesions Neck: Supple, no masses or thyromegaly Lungs: Clear throughout to auscultation Heart: Regular rate and rhythm; no murmurs, rubs or bruits Abdomen: Soft, non tender and non distended. No masses, hepatosplenomegaly or hernias noted. Normal Bowel sounds Musculoskeletal: Symmetrical with no gross deformities  Skin: No lesions on visible extremities Pulses:  Normal  pulses noted Extremities: No clubbing, cyanosis, edema or deformities noted Neurological: Alert oriented x 4, grossly nonfocal Cervical Nodes:  No significant cervical adenopathy Inguinal Nodes: No significant inguinal adenopathy Psychological:  Alert and cooperative. Normal mood and affect  Assessment and Recommendations:  1. Chronic cough with hoarseness. She appears to have stable GERD. It is possible that her cough and hoarseness are related to GERD with LPR however several other possibilities exist. I recommended ongoing followup with Drs. Clance and Shoemaker. Plan for a three-month trial of aggressively treating for reflux to see if this impacts her symptoms. Consider repeat endoscopy and a 24-hour ambulatory pH study. Begin Dexilant 60 mg every morning and famotidine 40 mg every evening. Return office visit to 3 months.  2. Colorectal cancer screening, average risk. 10 year screening colonoscopy due in February 2018.

## 2013-03-10 ENCOUNTER — Telehealth: Payer: Self-pay | Admitting: *Deleted

## 2013-03-10 NOTE — Telephone Encounter (Signed)
I called [employer] at [phone] for prior auth for Dexilant. Patient has tried Omeprazole per chart and Pantoprazole per [facility]. Patient was approved for Dexilant 60 mg until 10-07-2013. Referral number: [id number] Pharmacy notified

## 2013-03-11 ENCOUNTER — Other Ambulatory Visit: Payer: Self-pay | Admitting: Internal Medicine

## 2013-03-23 DIAGNOSIS — H4011X0 Primary open-angle glaucoma, stage unspecified: Secondary | ICD-10-CM | POA: Diagnosis not present

## 2013-03-23 DIAGNOSIS — H538 Other visual disturbances: Secondary | ICD-10-CM | POA: Diagnosis not present

## 2013-03-23 DIAGNOSIS — H2510 Age-related nuclear cataract, unspecified eye: Secondary | ICD-10-CM | POA: Diagnosis not present

## 2013-03-23 DIAGNOSIS — H11159 Pinguecula, unspecified eye: Secondary | ICD-10-CM | POA: Diagnosis not present

## 2013-03-23 DIAGNOSIS — H35039 Hypertensive retinopathy, unspecified eye: Secondary | ICD-10-CM | POA: Diagnosis not present

## 2013-03-23 DIAGNOSIS — H409 Unspecified glaucoma: Secondary | ICD-10-CM | POA: Diagnosis not present

## 2013-03-26 ENCOUNTER — Other Ambulatory Visit: Payer: Self-pay | Admitting: Internal Medicine

## 2013-03-26 ENCOUNTER — Telehealth: Payer: Self-pay | Admitting: Gastroenterology

## 2013-03-26 NOTE — Telephone Encounter (Signed)
Note not needed 

## 2013-03-30 ENCOUNTER — Ambulatory Visit (INDEPENDENT_AMBULATORY_CARE_PROVIDER_SITE_OTHER): Payer: Medicare Other | Admitting: Internal Medicine

## 2013-03-30 ENCOUNTER — Encounter: Payer: Self-pay | Admitting: Internal Medicine

## 2013-03-30 VITALS — BP 148/92 | HR 101 | Temp 97.4°F | Wt 142.4 lb

## 2013-03-30 DIAGNOSIS — R05 Cough: Secondary | ICD-10-CM

## 2013-03-30 DIAGNOSIS — J329 Chronic sinusitis, unspecified: Secondary | ICD-10-CM

## 2013-03-30 DIAGNOSIS — K219 Gastro-esophageal reflux disease without esophagitis: Secondary | ICD-10-CM | POA: Diagnosis not present

## 2013-03-30 MED ORDER — HYDROCODONE-HOMATROPINE 5-1.5 MG/5ML PO SYRP: 5.0000 mL | ORAL_SOLUTION | Freq: Four times a day (QID) | ORAL | Status: DC | PRN

## 2013-03-30 MED ORDER — PREDNISONE (PAK) 10 MG PO TABS: 10.0000 mg | ORAL_TABLET | ORAL | Status: DC

## 2013-03-30 NOTE — Assessment & Plan Note (Signed)
PPI changed by GI 03/2012 from nexium to dexilant due to chronic uncontrolled cough  Also on max H2B continue present plan and medications.

## 2013-03-30 NOTE — Progress Notes (Signed)
  Subjective:    Patient ID: Dorothy Lee, female    DOB: [birth year], 72 y.o.   MRN: [id number]  Cough   Here for continued cough - interval hx reviewed  Past Medical History  Diagnosis Date  . History of breast cancer     right - s/p XRT 1984  . CAD (coronary artery disease)     DES RCA and LAD 02/2008  . Hyperlipidemia   . GERD (gastroesophageal reflux disease)   . Urinary incontinence   . Scoliosis     with mild chronic LBP  . Allergic rhinitis   . ALLERGIC RHINITIS   . Cough     chronic  . HYPERTENSION, BORDERLINE   . ROTATOR CUFF SYNDROME, LEFT   . Diverticulosis      Review of Systems  Respiratory: Positive for cough.        Objective:   Physical Exam   BP 148/92  Pulse 101  Temp(Src) 97.4 F (36.3 C) (Oral)  Wt 142 lb 6.4 oz (64.592 kg)  BMI 25.23 kg/m2  SpO2 96% Weight: 142 lb 6.4 oz (64.592 kg)  Constitutional: She appears well-developed and well-nourished. No distress. mildly hoarse. Harsh but dry cough effort HENT: Head: Normocephalic and atraumatic. Sinus nontender - max and frontal; Ears: B TMs ok, no erythema or effusion; Nose: Nose normal.  Mouth/Throat: Oropharynx is red but clear and moist. No oropharyngeal exudate.  Eyes: Conjunctivae and EOM are normal. Pupils are equal, round, and reactive to light. No scleral icterus.  Neck: Normal range of motion. Neck supple. No LAD or JVD present. No thyromegaly present.  Cardiovascular: Normal rate, regular rhythm and normal heart sounds.  No murmur heard. No BLE edema. Pulmonary/Chest: Effort normal and breath sounds normal. No respiratory distress. She has no wheezes.   Lab Results  Component Value Date   WBC 6.3 12/03/2012   HGB 12.8 12/03/2012   HCT 38.3 12/03/2012   PLT 349 12/03/2012   GLUCOSE 90 12/03/2012   CHOL 174 12/02/2012   TRIG 153* 12/02/2012   HDL 36* 12/02/2012   LDLCALC 107* 12/02/2012   ALT 13 12/01/2012   AST 18 12/01/2012   NA 139 12/03/2012   K 3.8 12/03/2012   CL 106 12/03/2012   CREATININE 0.91 12/03/2012   BUN 15 12/03/2012   CO2 27 12/03/2012   TSH 0.77 12/07/2008   INR 0.99 12/01/2012   HGBA1C 6.1* 12/01/2012       Assessment & Plan:   See problem list. Medications and labs reviewed today.

## 2013-03-30 NOTE — Assessment & Plan Note (Signed)
Since cough symptoms unimproved after pulmonary and GI evaluation with max med treatment as described above, refer back to ENT. Has seen [name] for same in past 2-3 years - refer to same Continue nasal steroid as ongoing

## 2013-03-30 NOTE — Assessment & Plan Note (Signed)
Chronic symptoms - known acute and chronic sinusitis per CT sinuses August 2012 normal spirometry and chest x-ray February 2012  October 2012 chest x-ray with emphysematous changes but full PFTs 11/2011 WNL Recurrent symptoms in past 7 days - on H2B, antihistamine --  started ACEI late 11/2012 per cards, but stopped 02/2013 because of cough Resume 6d Pred Pak and hydromet for qhs use Continue albuterol inhaler when necessary  reviewed pulmonary eval 09/2012, GI eval 03/2013 for GERD Refer back to ENT now depending on outcome and response to therapy

## 2013-03-30 NOTE — Patient Instructions (Signed)
It was good to see you today. We have reviewed your prior records including labs and tests today Resume 6 day prednisone taper for your sinus and allergy symptoms; also ok for cough syrup at night - Your prescription(s) have been submitted to your pharmacy. Please take as directed and contact our office if you believe you are having problem(s) with the medication(s). Continue daily nose spray (Flonase) and daily antihistamine for control of sinus symptoms and Dexilant + Pepcid for reflux we'll make referral to ENT (shoemaker) . Our office will contact you regarding appointment(s) once made.

## 2013-04-14 DIAGNOSIS — E7800 Pure hypercholesterolemia, unspecified: Secondary | ICD-10-CM | POA: Diagnosis not present

## 2013-04-14 DIAGNOSIS — Z79899 Other long term (current) drug therapy: Secondary | ICD-10-CM | POA: Diagnosis not present

## 2013-04-16 ENCOUNTER — Other Ambulatory Visit: Payer: Self-pay | Admitting: Internal Medicine

## 2013-04-17 NOTE — Telephone Encounter (Signed)
Hycodan called to [facility]

## 2013-04-23 DIAGNOSIS — K219 Gastro-esophageal reflux disease without esophagitis: Secondary | ICD-10-CM | POA: Diagnosis not present

## 2013-04-23 DIAGNOSIS — J328 Other chronic sinusitis: Secondary | ICD-10-CM | POA: Diagnosis not present

## 2013-04-23 DIAGNOSIS — R05 Cough: Secondary | ICD-10-CM | POA: Diagnosis not present

## 2013-04-23 DIAGNOSIS — J342 Deviated nasal septum: Secondary | ICD-10-CM | POA: Diagnosis not present

## 2013-05-01 ENCOUNTER — Other Ambulatory Visit: Payer: Self-pay | Admitting: Internal Medicine

## 2013-05-04 ENCOUNTER — Ambulatory Visit (INDEPENDENT_AMBULATORY_CARE_PROVIDER_SITE_OTHER): Payer: Medicare Other | Admitting: Internal Medicine

## 2013-05-04 ENCOUNTER — Encounter: Payer: Self-pay | Admitting: Internal Medicine

## 2013-05-04 ENCOUNTER — Other Ambulatory Visit: Payer: Self-pay | Admitting: *Deleted

## 2013-05-04 VITALS — BP 140/84 | HR 102 | Temp 98.3°F | Wt 140.0 lb

## 2013-05-04 DIAGNOSIS — R059 Cough, unspecified: Secondary | ICD-10-CM

## 2013-05-04 DIAGNOSIS — R05 Cough: Secondary | ICD-10-CM | POA: Diagnosis not present

## 2013-05-04 MED ORDER — HYDROCODONE-HOMATROPINE 5-1.5 MG/5ML PO SYRP: ORAL_SOLUTION | ORAL | Status: DC

## 2013-05-04 NOTE — Patient Instructions (Signed)

## 2013-05-04 NOTE — Progress Notes (Signed)
Subjective:    Patient ID: Dorothy Lee, female    DOB: 1941/08/21, 72 y.o.   MRN: [id number]  HPI  Pt presents to the clinic today with c/o cough. This is a chronic complaint for her. She has been evaluated by cardiology, pulmonology and ENT for the same. No one has found a etiology for her cough. She is currently being treated with an albuterol inhaler, zyrtec, pepcid, flonase and Hycodan cough syrup. Nothing really has been effective but the cough syrup helps her sleep at nihgt. Without it, she is up all night coughing. She would like a refill today. She does plan to have a CT sinuses mid April, and her GI doctor is considering a upper endoscopy to evaluate her cough.  Review of Systems  Past Medical History  Diagnosis Date  . History of breast cancer     right - s/p XRT 1984  . CAD (coronary artery disease)     DES RCA and LAD 02/2008  . Hyperlipidemia   . GERD (gastroesophageal reflux disease)   . Urinary incontinence   . Scoliosis     with mild chronic LBP  . Allergic rhinitis   . ALLERGIC RHINITIS   . Cough     chronic  . HYPERTENSION, BORDERLINE   . ROTATOR CUFF SYNDROME, LEFT   . Diverticulosis     Current Outpatient Prescriptions  Medication Sig Dispense Refill  . albuterol (PROVENTIL HFA;VENTOLIN HFA) 108 (90 BASE) MCG/ACT inhaler Inhale 2 puffs into the lungs every 6 (six) hours as needed for wheezing.  18 g  1  . aspirin EC 81 MG tablet Take 81 mg by mouth every morning.      . cetirizine (ZYRTEC) 10 MG tablet Take 10 mg by mouth every morning.      . clopidogrel (PLAVIX) 75 MG tablet Take 75 mg by mouth every morning.       Marland Kitchen dexlansoprazole (DEXILANT) 60 MG capsule Take 1 capsule (60 mg total) by mouth daily.  30 capsule  9  . famotidine (PEPCID) 20 MG tablet take 1 tablet by mouth twice a day  60 tablet  5  . fluticasone (FLONASE) 50 MCG/ACT nasal spray Place 2 sprays into the nose daily as needed for rhinitis or allergies.      Marland Kitchen HYDROcodone-homatropine  (HYCODAN) 5-1.5 MG/5ML syrup take 5 milliliters (1 teaspoonful) by mouth every 6 hours if needed for cough  180 mL  0  . metoprolol succinate (TOPROL-XL) 25 MG 24 hr tablet Take 1 by mouth daily      . nitroGLYCERIN (NITROSTAT) 0.4 MG SL tablet Place 0.4 mg under the tongue every 5 (five) minutes x 3 doses as needed for chest pain. May repeat x3      . predniSONE (STERAPRED UNI-PAK) 10 MG tablet Take 1 tablet (10 mg total) by mouth as directed. As directed x 6 days  21 tablet  0  . raloxifene (EVISTA) 60 MG tablet Take 60 mg by mouth every other day.       . rosuvastatin (CRESTOR) 10 MG tablet Take 10 mg by mouth every morning.       . traMADol (ULTRAM) 50 MG tablet take 1 tablet by mouth every 8 hours if needed FOR PAIN.  30 tablet  0   No current facility-administered medications for this visit.    Allergies  Allergen Reactions  . Lisinopril Cough    Family History  Problem Relation Age of Onset  . Emphysema Father   .  Emphysema Brother   . Heart disease Brother     x3 brothers  . Lung cancer Brother   . Throat cancer Brother     History   Social History  . Marital Status: Divorced    Spouse Name: N/A    Number of Children: 1  . Years of Education: N/A   Occupational History  . hair stylist   . COSMETOLOGY    Social History Main Topics  . Smoking status: Never Smoker   . Smokeless tobacco: Never Used     Comment: Widowed, lives alone but has sig other.   . Alcohol Use: No     Comment: social alcohol > wine 2x week  . Drug Use: No  . Sexually Active: Not on file   Other Topics Concern  . 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  Respiratory: Pt reports cough. Denies difficulty breathing, shortness of breath, cough or  sputum production.   Cardiovascular: Denies chest pain, chest tightness, palpitations or swelling in the hands or feet.   No other specific complaints in a complete review of systems (except as listed in HPI above).     Objective:   Physical Exam  BP 140/84  Pulse 102  Temp(Src) 98.3 F (36.8 C) (Oral)  Wt 140 lb (63.504 kg)  BMI 24.81 kg/m2  SpO2 94% Wt Readings from Last 3 Encounters:  05/04/13 140 lb (63.504 kg)  03/30/13 142 lb 6.4 oz (64.592 kg)  03/09/13 145 lb 9.6 oz (66.044 kg)    General: Appears her stated age, well developed, well nourished in NAD.  Cardiovascular: Normal rate and rhythm. S1,S2 noted.  No murmur, rubs or gallops noted. No JVD or BLE edema. No carotid bruits noted. Pulmonary/Chest: Normal effort and positive vesicular breath sounds. No respiratory distress. No wheezes, rales or ronchi noted.   BMET    Component Value Date/Time   NA 139 12/03/2012 0500   K 3.8 12/03/2012 0500   CL 106 12/03/2012 0500   CO2 27 12/03/2012 0500   GLUCOSE 90 12/03/2012 0500   BUN 15 12/03/2012 0500   CREATININE 0.91 12/03/2012 0500   CALCIUM 8.7 12/03/2012 0500   GFRNONAA 61* 12/03/2012 0500   GFRAA 71* 12/03/2012 0500    Lipid Panel     Component Value Date/Time   CHOL 174 12/02/2012 0645   TRIG 153* 12/02/2012 0645   HDL 36* 12/02/2012 0645   CHOLHDL 4.8 12/02/2012 0645   VLDL 31 12/02/2012 0645   LDLCALC 107* 12/02/2012 0645    CBC    Component Value Date/Time   WBC 6.3 12/03/2012 0500   RBC 4.30 12/03/2012 0500   HGB 12.8 12/03/2012 0500   HCT 38.3 12/03/2012 0500   PLT 349 12/03/2012 0500   MCV 89.1 12/03/2012 0500   MCH 29.8 12/03/2012 0500   MCHC 33.4 12/03/2012 0500   RDW 13.2 12/03/2012 0500   LYMPHSABS 1.2 12/01/2012 1126   MONOABS 0.6 12/01/2012 1126   EOSABS 0.3 12/01/2012 1126   BASOSABS 0.0 12/01/2012 1126    Hgb A1C Lab Results  Component Value Date   HGBA1C 6.1* 12/01/2012         Assessment & Plan:

## 2013-05-04 NOTE — Telephone Encounter (Signed)
Faxed script back to [facility]...lmb 

## 2013-05-04 NOTE — Assessment & Plan Note (Signed)
Chronic Hycodan had already been refilled today by Dr. Felicity Coyer Continue to follow with ENT and GI

## 2013-05-05 ENCOUNTER — Telehealth: Payer: Self-pay | Admitting: *Deleted

## 2013-05-05 DIAGNOSIS — K219 Gastro-esophageal reflux disease without esophagitis: Secondary | ICD-10-CM

## 2013-05-05 DIAGNOSIS — R059 Cough, unspecified: Secondary | ICD-10-CM

## 2013-05-05 DIAGNOSIS — R05 Cough: Secondary | ICD-10-CM

## 2013-05-05 NOTE — Telephone Encounter (Signed)
Notified pt with md response.../lmb 

## 2013-05-05 NOTE — Telephone Encounter (Signed)
Pt called requesting a referral for an Endoscopy and CT due to the Chronic cough and GERD.  Please advise

## 2013-05-05 NOTE — Telephone Encounter (Signed)
Gastro refer done to discuss endo - Ordered CT chest

## 2013-05-06 ENCOUNTER — Telehealth: Payer: Self-pay | Admitting: Internal Medicine

## 2013-05-06 DIAGNOSIS — I2510 Atherosclerotic heart disease of native coronary artery without angina pectoris: Secondary | ICD-10-CM

## 2013-05-06 DIAGNOSIS — R05 Cough: Secondary | ICD-10-CM

## 2013-05-06 DIAGNOSIS — I10 Essential (primary) hypertension: Secondary | ICD-10-CM

## 2013-05-06 NOTE — Telephone Encounter (Signed)
PT SCHEDULED FOR HER CT CHEST W CONTRAST  ON 05-08-2013@9 :30  AM PER ROSE PT NEED A BUN AND CREATINE

## 2013-05-06 NOTE — Telephone Encounter (Signed)
Ordered Please notify patient to come in for pre-tests labs at her convenience before 8/1, does not need to be fasting

## 2013-05-06 NOTE — Telephone Encounter (Signed)
Notified pt with md response.../lmb 

## 2013-05-07 ENCOUNTER — Other Ambulatory Visit (INDEPENDENT_AMBULATORY_CARE_PROVIDER_SITE_OTHER): Payer: Medicare Other

## 2013-05-07 DIAGNOSIS — I10 Essential (primary) hypertension: Secondary | ICD-10-CM | POA: Diagnosis not present

## 2013-05-07 DIAGNOSIS — I2510 Atherosclerotic heart disease of native coronary artery without angina pectoris: Secondary | ICD-10-CM | POA: Diagnosis not present

## 2013-05-07 DIAGNOSIS — R05 Cough: Secondary | ICD-10-CM

## 2013-05-07 DIAGNOSIS — R059 Cough, unspecified: Secondary | ICD-10-CM

## 2013-05-07 LAB — BASIC METABOLIC PANEL
CO2: 29 mEq/L (ref 19–32)
Calcium: 9.1 mg/dL (ref 8.4–10.5)
Chloride: 107 mEq/L (ref 96–112)
Creatinine, Ser: 0.9 mg/dL (ref 0.4–1.2)
Sodium: 141 mEq/L (ref 135–145)

## 2013-05-08 ENCOUNTER — Inpatient Hospital Stay: Admission: RE | Admit: 2013-05-08 | Payer: Medicare Other | Source: Ambulatory Visit

## 2013-05-08 ENCOUNTER — Other Ambulatory Visit: Payer: Self-pay | Admitting: Internal Medicine

## 2013-05-10 ENCOUNTER — Other Ambulatory Visit: Payer: Self-pay | Admitting: Internal Medicine

## 2013-05-12 ENCOUNTER — Encounter: Payer: Self-pay | Admitting: Internal Medicine

## 2013-05-12 ENCOUNTER — Ambulatory Visit (INDEPENDENT_AMBULATORY_CARE_PROVIDER_SITE_OTHER): Payer: Medicare Other | Admitting: Internal Medicine

## 2013-05-12 VITALS — BP 142/80 | HR 96 | Temp 96.9°F | Wt 142.8 lb

## 2013-05-12 DIAGNOSIS — J209 Acute bronchitis, unspecified: Secondary | ICD-10-CM

## 2013-05-12 DIAGNOSIS — R05 Cough: Secondary | ICD-10-CM

## 2013-05-12 DIAGNOSIS — R0602 Shortness of breath: Secondary | ICD-10-CM

## 2013-05-12 MED ORDER — METHYLPREDNISOLONE ACETATE 80 MG/ML IJ SUSP
80.0000 mg | Freq: Once | INTRAMUSCULAR | Status: AC
  Administered 2013-05-12: 80 mg via INTRAMUSCULAR

## 2013-05-12 NOTE — Addendum Note (Signed)
Addended by: Deatra James on: 05/12/2013 02:54 PM   Modules accepted: Orders

## 2013-05-12 NOTE — Patient Instructions (Signed)

## 2013-05-12 NOTE — Progress Notes (Signed)
Subjective:    Patient ID: Dorothy Lee, female    DOB: [birth year], 72 y.o.   MRN: [id number]  HPI  Pt presents to the clinic today with c/o cough. This is a chronic complaint for her.The cough has gotten worse over the last week. She is not producing thick white sputum. She has some associated shortness of breath and wheezing with this. She denies chest pain, chest tightness, dizziness or lightheadedness. She does have some pain in her ribs that is worse with coughing.  She does not smoke. She has been evaluated by cardiology, pulmonology and ENT for the same. No one has found a etiology for her cough. She is currently being treated with an albuterol inhaler, zyrtec, pepcid, flonase and Hycodan cough syrup. Nothing really has been effective but the cough syrup helps her sleep at nihgt. Without it, she is up all night coughing. She would like a refill today. Her GI doctor is considering a upper endoscopy to evaluate her cough. She is supposed to have a CT of her sinuses and chest next week.  Review of Systems  Past Medical History  Diagnosis Date  . History of breast cancer     right - s/p XRT 1984  . CAD (coronary artery disease)     DES RCA and LAD 02/2008  . Hyperlipidemia   . GERD (gastroesophageal reflux disease)   . Urinary incontinence   . Scoliosis     with mild chronic LBP  . Allergic rhinitis   . ALLERGIC RHINITIS   . Cough     chronic  . HYPERTENSION, BORDERLINE   . ROTATOR CUFF SYNDROME, LEFT   . Diverticulosis     Current Outpatient Prescriptions  Medication Sig Dispense Refill  . albuterol (PROVENTIL HFA;VENTOLIN HFA) 108 (90 BASE) MCG/ACT inhaler Inhale 2 puffs into the lungs every 6 (six) hours as needed for wheezing.  18 g  1  . aspirin EC 81 MG tablet Take 81 mg by mouth every morning.      . benzonatate (TESSALON) 200 MG capsule take 1 capsule by mouth three times a day before meals  90 capsule  1  . cetirizine (ZYRTEC) 10 MG tablet Take 10 mg by mouth every  morning.      . clopidogrel (PLAVIX) 75 MG tablet Take 75 mg by mouth every morning.       Marland Kitchen dexlansoprazole (DEXILANT) 60 MG capsule Take 1 capsule (60 mg total) by mouth daily.  30 capsule  9  . famotidine (PEPCID) 20 MG tablet take 1 tablet by mouth twice a day  60 tablet  5  . fluticasone (FLONASE) 50 MCG/ACT nasal spray Place 2 sprays into the nose daily as needed for rhinitis or allergies.      Marland Kitchen HYDROcodone-homatropine (HYCODAN) 5-1.5 MG/5ML syrup take 5 milliliters (1 teaspoonful) by mouth every 6 hours if needed for cough  180 mL  0  . metoprolol succinate (TOPROL-XL) 25 MG 24 hr tablet Take 1 by mouth daily      . nitroGLYCERIN (NITROSTAT) 0.4 MG SL tablet Place 0.4 mg under the tongue every 5 (five) minutes x 3 doses as needed for chest pain. May repeat x3      . raloxifene (EVISTA) 60 MG tablet Take 60 mg by mouth every other day.       . rosuvastatin (CRESTOR) 10 MG tablet Take 10 mg by mouth every morning.       . traMADol (ULTRAM) 50 MG tablet take 1 tablet by  mouth every 8 hours if needed  30 tablet  0   No current facility-administered medications for this visit.    Allergies  Allergen Reactions  . Lisinopril Cough    Family History  Problem Relation Age of Onset  . Emphysema Father   . Emphysema Brother   . Heart disease Brother     x3 brothers  . Lung cancer Brother   . Throat cancer Brother     History   Social History  . Marital Status: Divorced    Spouse Name: N/A    Number of Children: 1  . Years of Education: N/A   Occupational History  . hair stylist   . COSMETOLOGY    Social History Main Topics  . Smoking status: Never Smoker   . Smokeless tobacco: Never Used     Comment: Widowed, lives alone but has sig other.   . Alcohol Use: No     Comment: social alcohol > wine 2x week  . Drug Use: No  . Sexually Active: Not on file   Other Topics Concern  . 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  Respiratory: Pt reports cough. Denies difficulty breathing, shortness of breath, cough or sputum production.   Cardiovascular: Denies chest pain, chest tightness, palpitations or swelling in the hands or feet.   No other specific complaints in a complete review of systems (except as listed in HPI above).     Objective:   Physical Exam  BP 142/80  Pulse 96  Temp(Src) 96.9 F (36.1 C) (Oral)  Wt 142 lb 12.8 oz (64.774 kg)  BMI 25.3 kg/m2  SpO2 97% Wt Readings from Last 3 Encounters:  05/12/13 142 lb 12.8 oz (64.774 kg)  05/04/13 140 lb (63.504 kg)  03/30/13 142 lb 6.4 oz (64.592 kg)    General: Appears her stated age, well developed, well nourished in NAD.  Cardiovascular: Normal rate and rhythm. S1,S2 noted.  No murmur, rubs or gallops noted. No JVD or BLE edema. No carotid bruits noted. Pulmonary/Chest: Normal effort and bilateral expiratory wheeze and scattered rhonchi throughout. No respiratory distress.   BMET    Component Value Date/Time   NA 141 05/07/2013 0817   K 3.8 05/07/2013 0817   CL 107 05/07/2013 0817   CO2 29 05/07/2013 0817   GLUCOSE 84 05/07/2013 0817   BUN 11 05/07/2013 0817   CREATININE 0.9 05/07/2013 0817   CALCIUM 9.1 05/07/2013 0817   GFRNONAA 61* 12/03/2012 0500   GFRAA 71* 12/03/2012 0500    Lipid Panel     Component Value Date/Time   CHOL 174 12/02/2012 0645   TRIG 153* 12/02/2012 0645   HDL 36* 12/02/2012 0645   CHOLHDL 4.8 12/02/2012 0645   VLDL 31 12/02/2012 0645   LDLCALC 107* 12/02/2012 0645    CBC    Component Value Date/Time   WBC 6.3 12/03/2012 0500   RBC 4.30 12/03/2012 0500   HGB 12.8 12/03/2012 0500   HCT 38.3 12/03/2012 0500   PLT 349 12/03/2012 0500   MCV 89.1 12/03/2012 0500   MCH 29.8 12/03/2012 0500   MCHC 33.4 12/03/2012 0500   RDW 13.2  12/03/2012 0500   LYMPHSABS 1.2 12/01/2012 1126   MONOABS 0.6  12/01/2012 1126   EOSABS 0.3 12/01/2012 1126   BASOSABS 0.0 12/01/2012 1126    Hgb A1C Lab Results  Component Value Date   HGBA1C 6.1* 12/01/2012         Assessment & Plan:   Cough, chronic with associated shortness of breath, concerning for acute bronchitis:  CT of chest has been ordered Will treat with Levaquin x 7 days Will give 80 mg Depo IM  Monitor symptoms, RTC as needed or if symptoms persist or worsen

## 2013-05-15 ENCOUNTER — Ambulatory Visit (INDEPENDENT_AMBULATORY_CARE_PROVIDER_SITE_OTHER): Payer: Medicare Other | Admitting: Internal Medicine

## 2013-05-15 ENCOUNTER — Encounter: Payer: Self-pay | Admitting: Internal Medicine

## 2013-05-15 ENCOUNTER — Telehealth: Payer: Self-pay | Admitting: Internal Medicine

## 2013-05-15 ENCOUNTER — Ambulatory Visit (INDEPENDENT_AMBULATORY_CARE_PROVIDER_SITE_OTHER)
Admission: RE | Admit: 2013-05-15 | Discharge: 2013-05-15 | Disposition: A | Discharge status: Home / Self Care | Payer: Medicare Other | Source: Ambulatory Visit | Attending: Internal Medicine | Admitting: Internal Medicine

## 2013-05-15 VITALS — BP 132/80 | HR 97 | Temp 97.7°F | Wt 138.1 lb

## 2013-05-15 DIAGNOSIS — R0602 Shortness of breath: Secondary | ICD-10-CM | POA: Diagnosis not present

## 2013-05-15 DIAGNOSIS — R05 Cough: Secondary | ICD-10-CM | POA: Diagnosis not present

## 2013-05-15 MED ORDER — LEVOFLOXACIN 500 MG PO TABS: 500.0000 mg | ORAL_TABLET | Freq: Every day | ORAL | Status: DC

## 2013-05-15 MED ORDER — HYDROCODONE-HOMATROPINE 5-1.5 MG/5ML PO SYRP: ORAL_SOLUTION | ORAL | Status: DC

## 2013-05-15 MED ORDER — PREDNISONE 10 MG PO TABS: ORAL_TABLET | ORAL | Status: DC

## 2013-05-15 MED ORDER — ALBUTEROL SULFATE HFA 108 (90 BASE) MCG/ACT IN AERS: 2.0000 | INHALATION_SPRAY | Freq: Four times a day (QID) | RESPIRATORY_TRACT | Status: DC | PRN

## 2013-05-15 NOTE — Progress Notes (Signed)
Subjective:    Patient ID: Dorothy Lee, female    DOB: 09-Dec-1940, 72 y.o.   MRN: [id number]  HPI  Pt presents to the clinic today with c/o cough. This is a chronic complaint for her.The cough has gotten worse over the last week. She is not producing thick white sputum. She has some associated shortness of breath and wheezing with this. She denies chest pain, chest tightness, dizziness or lightheadedness. She does have some pain in her ribs that is worse with coughing.  She does not smoke. She has been evaluated by cardiology, pulmonology and ENT for the same. No one has found a etiology for her cough. She is currently being treated with an albuterol inhaler, zyrtec, pepcid, flonase and Hycodan cough syrup. Nothing really has been effective but the cough syrup helps her sleep at nihgt. Without it, she is up all night coughing. She would like a refill today. Her GI doctor is considering a upper endoscopy to evaluate her cough. She is supposed to have a CT of her sinuses and chest next week. She was treated for acute bronchitis 3 days ago. She was prescribed levaquin but did not take it.  Review of Systems  Past Medical History  Diagnosis Date  . History of breast cancer     right - s/p XRT 1984  . CAD (coronary artery disease)     DES RCA and LAD 02/2008  . Hyperlipidemia   . GERD (gastroesophageal reflux disease)   . Urinary incontinence   . Scoliosis     with mild chronic LBP  . Allergic rhinitis   . ALLERGIC RHINITIS   . Cough     chronic  . HYPERTENSION, BORDERLINE   . ROTATOR CUFF SYNDROME, LEFT   . Diverticulosis     Current Outpatient Prescriptions  Medication Sig Dispense Refill  . albuterol (PROVENTIL HFA;VENTOLIN HFA) 108 (90 BASE) MCG/ACT inhaler Inhale 2 puffs into the lungs every 6 (six) hours as needed for wheezing.  18 g  1  . aspirin EC 81 MG tablet Take 81 mg by mouth every morning.      . benzonatate (TESSALON) 200 MG capsule take 1 capsule by mouth three times a  day before meals  90 capsule  1  . cetirizine (ZYRTEC) 10 MG tablet Take 10 mg by mouth every morning.      . clopidogrel (PLAVIX) 75 MG tablet Take 75 mg by mouth every morning.       Marland Kitchen dexlansoprazole (DEXILANT) 60 MG capsule Take 1 capsule (60 mg total) by mouth daily.  30 capsule  9  . famotidine (PEPCID) 20 MG tablet take 1 tablet by mouth twice a day  60 tablet  5  . fluticasone (FLONASE) 50 MCG/ACT nasal spray Place 2 sprays into the nose daily as needed for rhinitis or allergies.      Marland Kitchen HYDROcodone-homatropine (HYCODAN) 5-1.5 MG/5ML syrup take 5 milliliters (1 teaspoonful) by mouth every 6 hours if needed for cough  180 mL  0  . metoprolol succinate (TOPROL-XL) 25 MG 24 hr tablet Take 1 by mouth daily      . nitroGLYCERIN (NITROSTAT) 0.4 MG SL tablet Place 0.4 mg under the tongue every 5 (five) minutes x 3 doses as needed for chest pain. May repeat x3      . raloxifene (EVISTA) 60 MG tablet Take 60 mg by mouth every other day.       . rosuvastatin (CRESTOR) 10 MG tablet Take 10 mg by mouth  every morning.       . traMADol (ULTRAM) 50 MG tablet take 1 tablet by mouth every 8 hours if needed  30 tablet  0   No current facility-administered medications for this visit.    Allergies  Allergen Reactions  . Lisinopril Cough    Family History  Problem Relation Age of Onset  . Emphysema Father   . Emphysema Brother   . Heart disease Brother     x3 brothers  . Lung cancer Brother   . Throat cancer Brother     History   Social History  . Marital Status: Divorced    Spouse Name: N/A    Number of Children: 1  . Years of Education: N/A   Occupational History  . hair stylist   . COSMETOLOGY    Social History Main Topics  . Smoking status: Never Smoker   . Smokeless tobacco: Never Used     Comment: Widowed, lives alone but has sig other.   . Alcohol Use: No     Comment: social alcohol > wine 2x week  . Drug Use: No  . Sexually Active: Not on file   Other Topics Concern  .  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  Respiratory: Pt reports cough. Denies difficulty breathing, shortness of breath, cough or sputum production.   Cardiovascular: Denies chest pain, chest tightness, palpitations or swelling in the hands or feet.   No other specific complaints in a complete review of systems (except as listed in HPI above).     Objective:   Physical Exam  BP 132/80  Pulse 97  Temp(Src) 97.7 F (36.5 C) (Oral)  Wt 138 lb 1.9 oz (62.651 kg)  BMI 24.47 kg/m2  SpO2 93% Wt Readings from Last 3 Encounters:  05/15/13 138 lb 1.9 oz (62.651 kg)  05/12/13 142 lb 12.8 oz (64.774 kg)  05/04/13 140 lb (63.504 kg)    General: Appears her stated age, well developed, well nourished in NAD.  Cardiovascular: Normal rate and rhythm. S1,S2 noted.  No murmur, rubs or gallops noted. No JVD or BLE edema. No carotid bruits noted. Pulmonary/Chest: Normal effort and bilateral expiratory wheeze and scattered rhonchi throughout. No respiratory distress.   BMET    Component Value Date/Time   NA 141 05/07/2013 0817   K 3.8 05/07/2013 0817   CL 107 05/07/2013 0817   CO2 29 05/07/2013 0817   GLUCOSE 84 05/07/2013 0817   BUN 11 05/07/2013 0817   CREATININE 0.9 05/07/2013 0817   CALCIUM 9.1 05/07/2013 0817   GFRNONAA 61* 12/03/2012 0500   GFRAA 71* 12/03/2012 0500    Lipid Panel     Component Value Date/Time   CHOL 174 12/02/2012 0645   TRIG 153* 12/02/2012 0645   HDL 36* 12/02/2012 0645   CHOLHDL 4.8 12/02/2012 0645   VLDL 31 12/02/2012 0645   LDLCALC 107* 12/02/2012 0645    CBC    Component Value Date/Time   WBC 6.3 12/03/2012 0500   RBC 4.30 12/03/2012 0500   HGB 12.8 12/03/2012 0500   HCT 38.3 12/03/2012 0500   PLT 349 12/03/2012 0500    MCV 89.1 12/03/2012 0500   MCH 29.8 12/03/2012 0500   MCHC 33.4  12/03/2012 0500   RDW 13.2 12/03/2012 0500   LYMPHSABS 1.2 12/01/2012 1126   MONOABS 0.6 12/01/2012 1126   EOSABS 0.3 12/01/2012 1126   BASOSABS 0.0 12/01/2012 1126    Hgb A1C Lab Results  Component Value Date   HGBA1C 6.1* 12/01/2012         Assessment & Plan:   Cough, chronic with associated shortness of breath, concerning for acute bronchitis:  CT of chest has been ordered Will obtain chest xray to r/o pneumonia Take the levaquin that was callled in eRx for pred taper Refilled Hycodan  Monitor symptoms, RTC as needed or if symptoms persist or worsen

## 2013-05-15 NOTE — Patient Instructions (Signed)

## 2013-05-15 NOTE — Telephone Encounter (Signed)
Patient Information:  Caller Name: Guilianna  Phone: [phone]  Patient: Dorothy Lee, Dorothy Lee  Gender: Female  DOB: June 19, 1941  Age: 72 Years  PCP: Rene Paci (Adults only)  Office Follow Up:  Does the office need to follow up with this patient?: No  Instructions For The Office: N/A   Symptoms  Reason For Call & Symptoms: Unexplained cough for past year. Last seen in office on 05/13/13 and given shot of Prednisone, which did not help. Takes cough med with Codeine nightly to help calm coughing for sleep, but recently has had to take it during the day and is out of med. Works in Airline pilot and breaths fumes daily. This week she is has been feeling increased shortness of breath and cough is persistant and she is wheezing/ coughing up thick white mucos " like glue".. Afebrile. She takes generic Zyrtec for allergies and uses Albuterol Inhaler prn. Out of Inhaler. Concerned that cough is not going away- wondering if every tested for Pertusis. Going for CT Scan of Chest on 05/20/13.  Reviewed Health History In EMR: Yes  Reviewed Medications In EMR: Yes  Reviewed Allergies In EMR: Yes  Reviewed Surgeries / Procedures: Yes  Date of Onset of Symptoms: 05/08/2013  Guideline(s) Used:  Cough  Disposition Per Guideline:   Go to Office Now  Reason For Disposition Reached:   Wheezing is present  Advice Given:  Reassurance  Coughing is the way that our lungs remove irritants and mucus. It helps protect our lungs from getting pneumonia.  You can also get a cough after being exposed to irritating substances like smoke, strong perfumes, and dust.  Coughing Spasms:  Drink warm fluids. Inhale warm mist (Reason: both relax the airway and loosen up the phlegm).  Suck on cough drops or hard candy to coat the irritated throat.  Prevent Dehydration:  Drink adequate liquids.  This will help soothe an irritated or dry throat and loosen up the phlegm.  Expected Course:   The expected course depends on what is  causing the cough.  Viral bronchitis (chest cold) causes a cough that lasts 1 to 3 weeks. Sometimes you may cough up lots of phlegm (sputum, mucus). The mucus can normally be white, gray, yellow, or green.  Call Back If:  Difficulty breathing  Cough lasts more than 3 weeks  Fever lasts > 3 days  You become worse.  Patient Will Follow Care Advice:  YES  Appointment Scheduled:  05/15/2013 16:00:00 Appointment Scheduled Provider:  Nicki Reaper

## 2013-05-15 NOTE — Addendum Note (Signed)
Addended by: Lorre Munroe on: 05/15/2013 04:13 PM   Modules accepted: Orders

## 2013-05-18 ENCOUNTER — Telehealth: Payer: Self-pay | Admitting: *Deleted

## 2013-05-18 NOTE — Telephone Encounter (Signed)
Pt called requesting Xray results.  Advised pt as per result note.

## 2013-05-20 ENCOUNTER — Ambulatory Visit (INDEPENDENT_AMBULATORY_CARE_PROVIDER_SITE_OTHER): Payer: Medicare Other | Admitting: Gastroenterology

## 2013-05-20 ENCOUNTER — Encounter: Payer: Self-pay | Admitting: Gastroenterology

## 2013-05-20 VITALS — BP 160/90 | HR 100 | Ht 63.0 in | Wt 136.4 lb

## 2013-05-20 DIAGNOSIS — K219 Gastro-esophageal reflux disease without esophagitis: Secondary | ICD-10-CM | POA: Diagnosis not present

## 2013-05-20 DIAGNOSIS — R634 Abnormal weight loss: Secondary | ICD-10-CM | POA: Diagnosis not present

## 2013-05-20 DIAGNOSIS — R079 Chest pain, unspecified: Secondary | ICD-10-CM

## 2013-05-20 DIAGNOSIS — R05 Cough: Secondary | ICD-10-CM

## 2013-05-20 DIAGNOSIS — R059 Cough, unspecified: Secondary | ICD-10-CM

## 2013-05-20 NOTE — Progress Notes (Signed)
History of Present Illness: This is a 72 year old female with a chronic productive cough. She's been treated with aggressively for possible GERD with LPR although this did not appear to be primary cause of her cough. She has taken Dexilant 60 mg qam and Pepcid 40 mg qpm for 2 months with no change in symptoms. In fact her symptoms have worsened. She has a sore throat, wheezing, SOB, chest pain,weight loss and fatigue.  Current Medications, Allergies, Past Medical History, Past Surgical History, Family History and Social History were reviewed in [employer] record.  Physical Exam: General: Well developed , well nourished, coughing, in no acute distress Head: Normocephalic and atraumatic Eyes:  sclerae anicteric, EOMI Ears: Normal auditory acuity Mouth: No deformity or lesions Lungs: Expiratory wheezes bilaterally  Heart: Regular rate and rhythm; no murmurs, rubs or bruits Abdomen: Soft, non tender and non distended. No masses, hepatosplenomegaly or hernias noted. Normal Bowel sounds Musculoskeletal: Symmetrical with no gross deformities  Pulses:  Normal pulses noted Extremities: No clubbing, cyanosis, edema or deformities noted Neurological: Alert oriented x 4, grossly nonfocal Psychological:  Alert and cooperative. Normal mood and affect  Assessment and Recommendations:  1. Chronic productive cough, SOB, weight loss, wheezing. No change with Dexilant 60 mg qam and Pepcid 40 mg qpm so there is no significant components of GERD and LPR contributing to the symptoms. Pulmonary and ENT follow up is recommended.   2. GERD, chest pain, weight loss. EGD off Plavix. The risks, benefits, and alternatives to endoscopy with possible biopsy and possible dilation were discussed with the patient and they consent to proceed.

## 2013-05-20 NOTE — Patient Instructions (Addendum)
You have been scheduled for an endoscopy with propofol. Please follow written instructions given to you at your visit today. If you use inhalers (even only as needed), please bring them with you on the day of your procedure. Your physician has requested that you go to www.startemmi.com and enter the access code given to you at your visit today. This web site gives a general overview about your procedure. However, you should still follow specific instructions given to you by our office regarding your preparation for the procedure.  You have been scheduled to follow up with Dr. Shelle Iron on 06/22/13 at 9:45am. If you need to reschedule or cancel please call there office to avoid a no show fee at [phone].  Thank you for choosing me and [facility].  Venita Lick. Pleas Koch., MD., [facility] Graham

## 2013-05-25 DIAGNOSIS — J342 Deviated nasal septum: Secondary | ICD-10-CM | POA: Diagnosis not present

## 2013-05-25 DIAGNOSIS — K219 Gastro-esophageal reflux disease without esophagitis: Secondary | ICD-10-CM | POA: Diagnosis not present

## 2013-05-25 DIAGNOSIS — R05 Cough: Secondary | ICD-10-CM | POA: Diagnosis not present

## 2013-05-25 DIAGNOSIS — J329 Chronic sinusitis, unspecified: Secondary | ICD-10-CM | POA: Diagnosis not present

## 2013-05-25 DIAGNOSIS — J328 Other chronic sinusitis: Secondary | ICD-10-CM | POA: Diagnosis not present

## 2013-05-25 DIAGNOSIS — R51 Headache: Secondary | ICD-10-CM | POA: Diagnosis not present

## 2013-05-25 DIAGNOSIS — J310 Chronic rhinitis: Secondary | ICD-10-CM | POA: Diagnosis not present

## 2013-06-03 ENCOUNTER — Other Ambulatory Visit: Payer: Self-pay | Admitting: Internal Medicine

## 2013-06-04 NOTE — Telephone Encounter (Signed)
Faxed script back to [facility]...lmb 

## 2013-06-10 ENCOUNTER — Telehealth: Payer: Self-pay

## 2013-06-10 NOTE — Telephone Encounter (Signed)
Called and left a message at Dr. Gloris Manchester Turner's office to check on Plavix clearance before Endoscopy on 06/16/13.

## 2013-06-11 NOTE — Telephone Encounter (Signed)
Left a message for patient to return my call on both available numbers.

## 2013-06-11 NOTE — Telephone Encounter (Signed)
Dr. Mayford Knife returned my call and states she does not want patient coming off Plavix unless it is medically necessary. Do you want patient to stay on for Upper Endoscopy or cancel procedure?

## 2013-06-11 NOTE — Telephone Encounter (Signed)
We can proceed with EGD while on Plavix. Will not perform a dilation or polypectomy but do not expect those will be needed.

## 2013-06-11 NOTE — Telephone Encounter (Signed)
Spoke with patient and informed her of Dr. Mayford Knife and Dr. Ardell Isaacs recommendations. Patient states she stopped her Plavix yesterday because she states we told her to stop it 5 days before her procedure. Told her that her patient instructions state she needs to wait to hear from our office before stopping Plavix after we get approval from her Cardiologist. Pt states she misunderstood. Encouraged patient to start back immediatly and do not stop for her procedure. Pt agreed and verbalized understanding.

## 2013-06-16 ENCOUNTER — Ambulatory Visit (AMBULATORY_SURGERY_CENTER): Payer: Medicare Other | Admitting: Gastroenterology

## 2013-06-16 ENCOUNTER — Encounter: Payer: Self-pay | Admitting: Gastroenterology

## 2013-06-16 VITALS — BP 150/80 | HR 72 | Temp 98.7°F | Resp 18 | Ht 63.0 in | Wt 136.0 lb

## 2013-06-16 DIAGNOSIS — K219 Gastro-esophageal reflux disease without esophagitis: Secondary | ICD-10-CM | POA: Diagnosis not present

## 2013-06-16 DIAGNOSIS — G8929 Other chronic pain: Secondary | ICD-10-CM | POA: Diagnosis not present

## 2013-06-16 DIAGNOSIS — I2510 Atherosclerotic heart disease of native coronary artery without angina pectoris: Secondary | ICD-10-CM | POA: Diagnosis not present

## 2013-06-16 DIAGNOSIS — R05 Cough: Secondary | ICD-10-CM | POA: Diagnosis not present

## 2013-06-16 DIAGNOSIS — K2990 Gastroduodenitis, unspecified, without bleeding: Secondary | ICD-10-CM

## 2013-06-16 DIAGNOSIS — J45909 Unspecified asthma, uncomplicated: Secondary | ICD-10-CM | POA: Diagnosis not present

## 2013-06-16 DIAGNOSIS — K2970 Gastritis, unspecified, without bleeding: Secondary | ICD-10-CM

## 2013-06-16 DIAGNOSIS — I10 Essential (primary) hypertension: Secondary | ICD-10-CM | POA: Diagnosis not present

## 2013-06-16 DIAGNOSIS — D131 Benign neoplasm of stomach: Secondary | ICD-10-CM | POA: Diagnosis not present

## 2013-06-16 MED ORDER — SODIUM CHLORIDE 0.9 % IV SOLN: 500.0000 mL | INTRAVENOUS | Status: DC

## 2013-06-16 NOTE — Progress Notes (Signed)
Called to room to assist during endoscopic procedure.  Patient ID and intended procedure confirmed with present staff. Received instructions for my participation in the procedure from the performing physician.  

## 2013-06-16 NOTE — Op Note (Signed)
[facility] [address] [place], [address]   ENDOSCOPY PROCEDURE REPORT  PATIENT: Dorothy Lee, Dorothy Lee  MR#: [id number] BIRTHDATE: 1940/11/08 , 72  yrs. old GENDER: Female ENDOSCOPIST: Meryl Dare, MD, [facility] PROCEDURE DATE:  06/16/2013 PROCEDURE:  EGD w/ biopsy ASA CLASS:     Class III INDICATIONS:  History of esophageal reflux. MEDICATIONS: MAC sedation, administered by CRNA and propofol (Diprivan) 100mg  IV TOPICAL ANESTHETIC: Cetacaine Spray DESCRIPTION OF PROCEDURE: After the risks benefits and alternatives of the procedure were thoroughly explained, informed consent was obtained.  The LB [id number] endoscope was introduced through the mouth and advanced to the second portion of the duodenum  without limitations.  The instrument was slowly withdrawn as the mucosa was fully examined.  ESOPHAGUS: The mucosa of the esophagus appeared normal. STOMACH: Mild erosive gastritis (inflammation) was found in the gastric body. Multiple biopsies were performed. The stomach otherwise appeared normal. DUODENUM: The duodenal mucosa showed no abnormalities in the bulb and second portion of the duodenum.  Retroflexed views revealed no abnormalities.  The scope was then withdrawn from the patient and the procedure completed.  COMPLICATIONS: There were no complications.  ENDOSCOPIC IMPRESSION: 1.   Erosive gastritis (inflammation) in the gastric body; multiple biopsies 2.   The EGD was otherwise appeared normal  RECOMMENDATIONS: 1.  Await pathology results 2.  Anti-reflux regimen 3.  Continue PPI   eSigned:  Meryl Dare, MD, [facility] 06/16/2013 3:16 PM

## 2013-06-16 NOTE — Progress Notes (Signed)
Patient did not experience any of the following events: a burn prior to discharge; a fall within the facility; wrong site/side/patient/procedure/implant event; or a hospital transfer or hospital admission upon discharge from the facility. (G8907) Patient did not have preoperative order for IV antibiotic SSI prophylaxis. (G8918)  

## 2013-06-16 NOTE — Patient Instructions (Addendum)
YOU HAD AN ENDOSCOPIC PROCEDURE TODAY AT THE [facility]: Refer to the procedure report that was given to you for any specific questions about what was found during the examination.  If the procedure report does not answer your questions, please call your gastroenterologist to clarify.  If you requested that your care partner not be given the details of your procedure findings, then the procedure report has been included in a sealed envelope for you to review at your convenience later.  YOU SHOULD EXPECT: Some feelings of bloating in the abdomen. Passage of more gas than usual.  Walking can help get rid of the air that was put into your GI tract during the procedure and reduce the bloating. If you had a lower endoscopy (such as a colonoscopy or flexible sigmoidoscopy) you may notice spotting of blood in your stool or on the toilet paper. If you underwent a bowel prep for your procedure, then you may not have a normal bowel movement for a few days.  DIET: Your first meal following the procedure should be a light meal and then it is ok to progress to your normal diet.  A half-sandwich or bowl of soup is an example of a good first meal.  Heavy or fried foods are harder to digest and may make you feel nauseous or bloated.  Likewise meals heavy in dairy and vegetables can cause extra gas to form and this can also increase the bloating.  Drink plenty of fluids but you should avoid alcoholic beverages for 24 hours.  ACTIVITY: Your care partner should take you home directly after the procedure.  You should plan to take it easy, moving slowly for the rest of the day.  You can resume normal activity the day after the procedure however you should NOT DRIVE or use heavy machinery for 24 hours (because of the sedation medicines used during the test).    SYMPTOMS TO REPORT IMMEDIATELY: A gastroenterologist can be reached at any hour.  During normal business hours, 8:30 AM to 5:00 PM Monday through Friday,  call [phone].  After hours and on weekends, please call the GI answering service at [phone] who will take a message and have the physician on call contact you.    Following upper endoscopy (EGD)  Vomiting of blood or coffee ground material  New chest pain or pain under the shoulder blades  Painful or persistently difficult swallowing  New shortness of breath  Fever of 100F or higher  Black, tarry-looking stools  FOLLOW UP: If any biopsies were taken you will be contacted by phone or by letter within the next 1-3 weeks.  Call your gastroenterologist if you have not heard about the biopsies in 3 weeks.  Our staff will call the home number listed on your records the next business day following your procedure to check on you and address any questions or concerns that you may have at that time regarding the information given to you following your procedure. This is a courtesy call and so if there is no answer at the home number and we have not heard from you through the emergency physician on call, we will assume that you have returned to your regular daily activities without incident.  SIGNATURES/CONFIDENTIALITY: You and/or your care partner have signed paperwork which will be entered into your electronic medical record.  These signatures attest to the fact that that the information above on your After Visit Summary has been reviewed and is understood.  Full   responsibility of the confidentiality of this discharge information lies with you and/or your care-partner.   Resume medications. Information given on gastritis with discharge instructions. 

## 2013-06-16 NOTE — Progress Notes (Signed)
Report to pacu rn, vss, bbs=clear 

## 2013-06-17 ENCOUNTER — Telehealth: Payer: Self-pay

## 2013-06-17 NOTE — Telephone Encounter (Signed)
Left message on answering machine. 

## 2013-06-22 ENCOUNTER — Ambulatory Visit (INDEPENDENT_AMBULATORY_CARE_PROVIDER_SITE_OTHER): Payer: Medicare Other | Admitting: Pulmonary Disease

## 2013-06-22 ENCOUNTER — Encounter: Payer: Self-pay | Admitting: Pulmonary Disease

## 2013-06-22 ENCOUNTER — Encounter: Payer: Self-pay | Admitting: Gastroenterology

## 2013-06-22 VITALS — BP 112/80 | HR 85 | Temp 98.2°F | Ht 63.0 in | Wt 137.2 lb

## 2013-06-22 DIAGNOSIS — R05 Cough: Secondary | ICD-10-CM

## 2013-06-22 NOTE — Progress Notes (Signed)
  Subjective:    Patient ID: Dorothy Lee, female    DOB: 12-09-1940, 72 y.o.   MRN: [id number]  HPI The patient comes in today for followup of her known chronic cough.  This has never totally resolved, and she is currently seeing otolaryngology for chronic sinusitis, and gastroenterology for possible laryngopharyngeal reflux.  She continues to experience a globus sensation, and tells me that her cough is worse upon lying down at night.  I have explained to her this is very suspicious for either postnasal drip or reflux.  She is using her nasal rinses religiously, but has not kept up with her nasal spray.  She is supposed to be on a proton pump inhibitor, but was unable to afford when she is treated with an antibiotic and a course of prednisone and she has resolution of her cough, and then it returns.  She denies any increased shortness of breath, and her cough primarily is dry in nature.   Review of Systems  Constitutional: Negative for fever and unexpected weight change.  HENT: Negative for ear pain, nosebleeds, congestion, sore throat, rhinorrhea, sneezing, trouble swallowing, dental problem, postnasal drip and sinus pressure.   Eyes: Negative for redness and itching.  Respiratory: Positive for cough, chest tightness, shortness of breath and wheezing.   Cardiovascular: Positive for chest pain. Negative for palpitations and leg swelling.  Gastrointestinal: Negative for nausea and vomiting.  Genitourinary: Negative for dysuria.  Musculoskeletal: Negative for joint swelling.  Skin: Negative for rash.  Neurological: Negative for headaches.  Hematological: Does not bruise/bleed easily.  Psychiatric/Behavioral: Negative for dysphoric mood. The patient is not nervous/anxious.        Objective:   Physical Exam Well-developed female in no acute distress Nose without purulence or discharge noted Oropharynx clear Neck without lymphadenopathy or thyromegaly Chest with clear breath sounds  bilaterally, no wheezing Cardiac exam is regular rate and rhythm Lower extremities without edema, cyanosis Alert and oriented, moves all 4 extremities.       Assessment & Plan:

## 2013-06-22 NOTE — Assessment & Plan Note (Signed)
The patient has a history of long-standing cough for years, with past pulmonary workup being unremarkable.  I continue to believe her cough is coming from postnasal drip, possibly reflux, and cyclical coughing.  She tells me that her cough improves with antibiotics and prednisone, and then worsens when she stops her medication.  This makes me very suspicious that chronic sinusitis is the culprit here.  She is not using her nasal steroid consistently, and I have asked her to get back on this.  I also think she needs to be on aggressive treatment for possible laryngopharyngeal reflux, and she has been prescribed Dexilant by GI.  If she continues to have issues, I am willing to do methacholine challenge to definitely r/o the possibility of cough variant asthma.

## 2013-06-22 NOTE — Patient Instructions (Addendum)
Get back on your nasal spray everyday!  Would stop using your inhaler. Please discuss a less expensive alternative medication for possible reflux with Dr. Russella Dar Continue your behavioral therapies we discussed:  Hard candy, voice limitation, no throat clearing.  If your cough continues to be an issue, please call me.

## 2013-06-24 ENCOUNTER — Other Ambulatory Visit: Payer: Self-pay | Admitting: Internal Medicine

## 2013-06-25 ENCOUNTER — Encounter: Payer: Self-pay | Admitting: Internal Medicine

## 2013-06-25 ENCOUNTER — Telehealth: Payer: Self-pay | Admitting: *Deleted

## 2013-06-25 ENCOUNTER — Ambulatory Visit (INDEPENDENT_AMBULATORY_CARE_PROVIDER_SITE_OTHER): Payer: Medicare Other | Admitting: Internal Medicine

## 2013-06-25 VITALS — BP 150/100 | HR 96 | Temp 97.1°F | Ht 63.0 in | Wt 135.4 lb

## 2013-06-25 DIAGNOSIS — I10 Essential (primary) hypertension: Secondary | ICD-10-CM | POA: Diagnosis not present

## 2013-06-25 DIAGNOSIS — R05 Cough: Secondary | ICD-10-CM | POA: Diagnosis not present

## 2013-06-25 DIAGNOSIS — K219 Gastro-esophageal reflux disease without esophagitis: Secondary | ICD-10-CM

## 2013-06-25 MED ORDER — HYDROCODONE-HOMATROPINE 5-1.5 MG/5ML PO SYRP: ORAL_SOLUTION | ORAL | Status: DC

## 2013-06-25 NOTE — Patient Instructions (Signed)
Please take all new medication as prescribed  - the cough medicine refill Please continue all other medications as before, even the dexilant at twice per day You will be contacted regarding the referral for: Pulmonary at [facility]  Please remember to sign up for My Chart if you have not done so, as this will be important to you in the future with finding out test results, communicating by private email, and scheduling acute appointments online when needed.

## 2013-06-25 NOTE — Telephone Encounter (Signed)
Spoke with pt advised of providers message. 

## 2013-06-25 NOTE — Assessment & Plan Note (Signed)
Ok for dexilant bid en route to pulm referral/[facility]

## 2013-06-25 NOTE — Assessment & Plan Note (Signed)
Severe, disabling at night for pt, unable to sleep, reduced work function, ok for cough med, ok for dexilant at bid even, and pt requests refer to [facility]

## 2013-06-25 NOTE — Assessment & Plan Note (Signed)
Off ace, mild increased today liekly situational, o/w stable overall by history and exam, recent data reviewed with pt, and pt to continue medical treatment as before,  to f/u any worsening symptoms or concerns BP Readings from Last 3 Encounters:  06/25/13 150/100  06/22/13 112/80  06/16/13 150/80

## 2013-06-25 NOTE — Telephone Encounter (Signed)
Pt called states she is still having bad cough.  Requesting cough Rx.  Please advise in Dr Diamantina Monks absence

## 2013-06-25 NOTE — Telephone Encounter (Signed)
She is being followed by pulm and GI for this. I can refill her tessalon pearls if she would like.

## 2013-06-25 NOTE — Progress Notes (Signed)
Subjective:    Patient ID: Dorothy Lee, female    DOB: [birth year], 72 y.o.   MRN: [id number]  HPI  Here more out of sense of desparation, has ongoing for > 1 yr even back to 2011, has completed extensive evaluation per pulm, GI and ent, mult med trials including antibx and steroid without resolution, at her wits end, still trying to work but c/o ongoing fatigue related to cough that wont stop at night, has chronic sleep deprivation.  No acute complaints such as new fever, productivity, CP, but has mild wheezes today that hasnt always been present on prior exams.  Started dexilant 60 qd yesterday.  Only needs the cough med qhs Past Medical History  Diagnosis Date  . History of breast cancer     right - s/p XRT 1984  . CAD (coronary artery disease)     DES RCA and LAD 02/2008  . Hyperlipidemia   . GERD (gastroesophageal reflux disease)   . Urinary incontinence   . Scoliosis     with mild chronic LBP  . Allergic rhinitis   . ALLERGIC RHINITIS   . Cough     chronic  . HYPERTENSION, BORDERLINE   . ROTATOR CUFF SYNDROME, LEFT   . Diverticulosis    Past Surgical History  Procedure Laterality Date  . Appendectomy  1981  . Coronary angioplasty with stent placement  02/2008  . Breast biopsy  1984  . Breast lumpectomy  1982    right breast    reports that she has never smoked. She has never used smokeless tobacco. She reports that she does not drink alcohol or use illicit drugs. family history includes Emphysema in her brother and father; Heart disease in her brother; Lung cancer in her brother; Throat cancer in her brother. Allergies  Allergen Reactions  . Lisinopril Cough   Current Outpatient Prescriptions on File Prior to Visit  Medication Sig Dispense Refill  . albuterol (PROVENTIL HFA;VENTOLIN HFA) 108 (90 BASE) MCG/ACT inhaler Inhale 2 puffs into the lungs every 6 (six) hours as needed for wheezing.  18 g  1  . aspirin EC 81 MG tablet Take 81 mg by mouth every morning.      .  benzonatate (TESSALON) 200 MG capsule take 1 capsule by mouth three times a day before meals  90 capsule  1  . cetirizine (ZYRTEC) 10 MG tablet Take 10 mg by mouth every morning.      . clopidogrel (PLAVIX) 75 MG tablet Take 75 mg by mouth every morning.       . famotidine (PEPCID) 20 MG tablet take 1 tablet by mouth twice a day  60 tablet  5  . fluticasone (FLONASE) 50 MCG/ACT nasal spray Place 2 sprays into the nose daily as needed for rhinitis or allergies.      . nitroGLYCERIN (NITROSTAT) 0.4 MG SL tablet Place 0.4 mg under the tongue every 5 (five) minutes x 3 doses as needed for chest pain. May repeat x3      . predniSONE (DELTASONE) 10 MG tablet Take 3 tablets on days 1-2, take 2 tablets on days 3-4, take 1 tablet on days 5-6  12 tablet  0  . raloxifene (EVISTA) 60 MG tablet Take 60 mg by mouth every other day.       . rosuvastatin (CRESTOR) 10 MG tablet Take 10 mg by mouth every morning.        No current facility-administered medications on file prior to visit.   Review  of Systems  Constitutional: Negative for unexpected weight change, or unusual diaphoresis  HENT: Negative for tinnitus.   Eyes: Negative for photophobia and visual disturbance.  Respiratory: Negative for choking and stridor.   Gastrointestinal: Negative for vomiting and blood in stool.  Genitourinary: Negative for hematuria and decreased urine volume.  Musculoskeletal: Negative for acute joint swelling Skin: Negative for color change and wound.  Neurological: Negative for tremors and numbness other than noted  Psychiatric/Behavioral: Negative for decreased concentration or  hyperactivity.       Objective:   Physical Exam BP 150/100  Pulse 96  Temp(Src) 97.1 F (36.2 C) (Oral)  Ht 5\' 3"  (1.6 m)  Wt 135 lb 6 oz (61.406 kg)  BMI 23.99 kg/m2  SpO2 96% VS noted,  Constitutional: Pt appears well-developed and well-nourished.  HENT: Head: NCAT.  Right Ear: External ear normal.  Left Ear: External ear normal.   Eyes: Conjunctivae and EOM are normal. Pupils are equal, round, and reactive to light.  Neck: Normal range of motion. Neck supple.  Cardiovascular: Normal rate and regular rhythm.   Pulmonary/Chest: Effort normal and breath sounds decresaed bilat, few insp wheezes.  Abd:  Soft, NT, non-distended, + BS Neurological: Pt is alert. Not confused  Skin: Skin is warm. No erythema.  Psychiatric: Pt behavior is normal. Thought content normal. 1+ enrvous        Assessment & Plan:

## 2013-06-26 ENCOUNTER — Telehealth: Payer: Self-pay | Admitting: Gastroenterology

## 2013-06-26 NOTE — Telephone Encounter (Signed)
Patient has had persistent problems with her cough and SOB at night.  She started back on dexilant 2 days ago, last night she had SOB with times gasping for air.  She reports that she is taking the Dexilant in the am, but also taking tessalon pearls and hycodan cough syrup at night.  She is advised that the dexilant is unlikely to cause SOB, that she should only take the hycodan or tessalon.  She is advised that the combination of the two drugs may be contributing to for SOB.  She states she will try Dexilant again tomorrow in the am and her hycodan at night.  She is being referred to [facility] by Dr. Jonny Ruiz.  See his office note from 06/25/13

## 2013-06-26 NOTE — Telephone Encounter (Signed)
Agree with plans and referral as planned by Dr Jonny Ruiz

## 2013-06-27 ENCOUNTER — Ambulatory Visit: Payer: Medicare Other

## 2013-06-27 ENCOUNTER — Telehealth: Payer: Self-pay | Admitting: Radiology

## 2013-06-27 ENCOUNTER — Ambulatory Visit (INDEPENDENT_AMBULATORY_CARE_PROVIDER_SITE_OTHER): Payer: Medicare Other | Admitting: Family Medicine

## 2013-06-27 VITALS — BP 140/92 | HR 101 | Temp 97.8°F | Resp 18

## 2013-06-27 DIAGNOSIS — R0602 Shortness of breath: Secondary | ICD-10-CM | POA: Diagnosis not present

## 2013-06-27 DIAGNOSIS — J309 Allergic rhinitis, unspecified: Secondary | ICD-10-CM

## 2013-06-27 DIAGNOSIS — R0609 Other forms of dyspnea: Secondary | ICD-10-CM

## 2013-06-27 DIAGNOSIS — J9801 Acute bronchospasm: Secondary | ICD-10-CM

## 2013-06-27 DIAGNOSIS — R0603 Acute respiratory distress: Secondary | ICD-10-CM

## 2013-06-27 DIAGNOSIS — K219 Gastro-esophageal reflux disease without esophagitis: Secondary | ICD-10-CM

## 2013-06-27 DIAGNOSIS — R05 Cough: Secondary | ICD-10-CM | POA: Diagnosis not present

## 2013-06-27 LAB — POCT CBC
Granulocyte percent: 61 %G (ref 37–80)
HCT, POC: 45 % (ref 37.7–47.9)
Lymph, poc: 2.6 (ref 0.6–3.4)
MCHC: 31.8 g/dL (ref 31.8–35.4)
MID (cbc): 0.7 (ref 0–0.9)
POC Granulocyte: 5.2 (ref 2–6.9)
POC LYMPH PERCENT: 30.7 %L (ref 10–50)
Platelet Count, POC: 309 10*3/uL (ref 142–424)
RDW, POC: 14.1 %

## 2013-06-27 MED ORDER — PREDNISONE 20 MG PO TABS: ORAL_TABLET | ORAL | Status: DC

## 2013-06-27 MED ORDER — AMOXICILLIN-POT CLAVULANATE 875-125 MG PO TABS: 1.0000 | ORAL_TABLET | Freq: Two times a day (BID) | ORAL | Status: DC

## 2013-06-27 MED ORDER — ALBUTEROL SULFATE (2.5 MG/3ML) 0.083% IN NEBU: 2.5000 mg | INHALATION_SOLUTION | Freq: Once | RESPIRATORY_TRACT | Status: DC

## 2013-06-27 NOTE — Patient Instructions (Addendum)
1.  RETURN IN 48-72 HOURS FOR TB SKIN TEST READ. 2.  START PEPCID 2 TABLETS AT BEDTIME; CONTINUE DEXILANT DAILY. 3.  CONTINUE ZYRTEC AND FLONASE. 4.  START ALBUTEROL RESCUE INHALER 2 PUFFS FOUR TIMES DAILY FOR WHEEZING.  5.  CONTINUE TESSALON PERLES AND COUGH SYRUP AS NEEDED FOR COUGH.

## 2013-06-27 NOTE — Progress Notes (Signed)
[address]   [place], [place]  [address]   [phone]  Subjective:    Patient ID: Dorothy Lee, female    DOB: 01-13-1941, 72 y.o.   MRN: [id number]  HPI This 72 y.o. female presents for evaluation of SOB, worsening coughing, wheezing.  Brought back emergently due to respiratory distress detected by front office staff.   Has suffered with chronic daily cough for the past three years; has undergone an extensive work up in the past including pulmonology consultation by Dr. Shelle Iron, ENT consultation with CT sinuses by Dr. [name], GI consultation with EGD.  CT sinuses showed a mild sinusitis; + antibiotic prescribed.  EGD showed diffuse gastritis; has been maintained on Omeprazole for a long time; switched to Dexilant.  Took Dexilant for one month and then stopped due to expense; then advised to restart medication three days ago.   Has been maintained on Flonase for a long time; takes Zyrtec daily.   Always improves on Prednisone and antibiotics but cough recurs when completes courses.  No diagnosis of asthma; no smoking history; is a Interior and spatial designer.  Everyone feels that cough due to GERD; does have FB sensation in throat chronically.   Prescribed cough medication and Tessalon Perles by Dr. Shelle Iron.  Albuterol stopped by Cl[facility].   Has never wheezed with cough until past two days.  PCP noticed mild wheezing at office three days ago. No fever/chills/sweats.  No ear pain. +ST/scratchy throat from PND.  Chronic rhinorrhea.  No nasal congestion.  No sputum production.  Rarely wheezes.  Horrible SOB last night.   Has lost 30 pounds in past three years with chronic cough.  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 Chronic night sweats for several years; assumes due to medications.  Never suffered with menopausal night sweats. CXR normal in past three years.   Tuberculosis Risk Questionnaire  1. No Were you born outside the [place] in one of the following parts of the world: [place], [place],  [place], [place] or [place]?    2. No Have you traveled outside the [place] and lived for more than one month in one of the following parts of the world: [place], [place], [place], [place] or [place]?    3. No Do you have a compromised immune system such as from any of the following conditions:HIV/AIDS, organ or bone marrow transplantation, diabetes, immunosuppressive medicines (e.g. Prednisone, Remicaide), leukemia, lymphoma, cancer of the head or neck, gastrectomy or jejunal bypass, end-stage renal disease (on dialysis), or silicosis?     4. 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    5. No Have you ever: injected illegal drugs, used crack cocaine, lived in a homeless shelter  or been in jail or prison?     6. No Have you ever been exposed to anyone with infectious tuberculosis?    Tuberculosis Symptom Questionnaire  Do you currently have any of the following symptoms?  1. Yes COUGH FOR THREE YEARS Unexplained cough lasting more than 3 weeks?   2. No Unexplained fever lasting more than 3 weeks.   3. Yes PT.STATES MEDICATION RELATED Night Sweats (sweating that leaves the bedclothes and sheets wet)     4. Yes DUE TO COUGH Shortness of Breath   5. No Chest Pain   6. Yes PT.STATES SHE HAS LOST 30 LBS Unintentional  weight loss    7. Yes PATIENT SAYS SHE IS FATIGUED FROM COUGHING Unexplained fatigue (very tired for no reason)    Review of Systems  Constitutional: Positive for diaphoresis, fatigue and unexpected weight change. Negative for fever and chills.  HENT: Positive for congestion, sore throat, rhinorrhea, sneezing and postnasal drip. Negative for ear pain, trouble swallowing and voice change.   Respiratory: Positive for cough, shortness of breath and wheezing.   Cardiovascular: Positive for chest pain. Negative for  palpitations and leg swelling.  Gastrointestinal: Positive for vomiting. Negative for abdominal pain.       +post-tussive vomiting.  Hematological: Negative for adenopathy.  Psychiatric/Behavioral: Positive for sleep disturbance.   Past Medical History  Diagnosis Date  . History of breast cancer     right - s/p XRT 1984  . CAD (coronary artery disease)     DES RCA and LAD 02/2008  . Hyperlipidemia   . GERD (gastroesophageal reflux disease)   . Urinary incontinence   . Scoliosis     with mild chronic LBP  . Allergic rhinitis   . ALLERGIC RHINITIS   . Cough     chronic  . HYPERTENSION, BORDERLINE   . ROTATOR CUFF SYNDROME, LEFT   . Diverticulosis    Past Surgical History  Procedure Laterality Date  . Appendectomy  1981  . Coronary angioplasty with stent placement  02/2008  . Breast biopsy  1984  . Breast lumpectomy  1982    right breast   Allergies  Allergen Reactions  . Lisinopril Cough   Current Outpatient Prescriptions on File Prior to Visit  Medication Sig Dispense Refill  . aspirin EC 81 MG tablet Take 81 mg by mouth every morning.      . benzonatate (TESSALON) 200 MG capsule take 1 capsule by mouth three times a day before meals  90 capsule  1  . cetirizine (ZYRTEC) 10 MG tablet Take 10 mg by mouth every morning.      . clopidogrel (PLAVIX) 75 MG tablet Take 75 mg by mouth every morning.       . fluticasone (FLONASE) 50 MCG/ACT nasal spray Place 2 sprays into the nose daily as needed for rhinitis or allergies.      . nitroGLYCERIN (NITROSTAT) 0.4 MG SL tablet Place 0.4 mg under the tongue every 5 (five) minutes x 3 doses as needed for chest pain. May repeat x3      . raloxifene (EVISTA) 60 MG tablet Take 60 mg by mouth every other day.       . rosuvastatin (CRESTOR) 10 MG tablet Take 10 mg by mouth every morning.       Marland Kitchen albuterol (PROVENTIL HFA;VENTOLIN HFA) 108 (90 BASE) MCG/ACT inhaler Inhale 2 puffs into the lungs every 6 (six) hours as needed for wheezing.  18 g   1  . famotidine (PEPCID) 20 MG tablet take 1 tablet by mouth twice a day  60 tablet  5  . HYDROcodone-homatropine (HYCODAN) 5-1.5 MG/5ML syrup take 5 milliliters (1 teaspoonful) by mouth every 6 hours if needed for cough  240 mL  1   No current facility-administered medications on file prior to visit.       Objective:   Physical Exam  Nursing note and vitals reviewed. Constitutional: She is oriented to person, place, and time. She appears well-developed and well-nourished. She appears distressed.  Mild distress due to labored breathing/respiratory distress; speaking in short choppy sentences at triage.  HENT:  Head: Normocephalic and atraumatic.  Right Ear: External ear normal.  Left Ear: External ear normal.  Nose: Nose normal.  Mouth/Throat: Oropharynx is clear and moist. No oropharyngeal exudate.  Eyes: Conjunctivae and EOM are normal. Pupils are equal, round, and reactive to light.  Neck: Normal range of motion. Neck supple.  Cardiovascular: Regular rhythm.  Tachycardia present.  Exam reveals no gallop and no friction rub.   No murmur heard. Rate 100.  Pulmonary/Chest: Tachypnea noted. She is in respiratory distress. She has no decreased breath sounds. She has wheezes. She has rhonchi. She has no rales.  Speaking in short choppy sentences; RR 26; diffuse wheezing inspiratory and expiratory throughout with prolonged expiratory phase.  +retractions.    Lymphadenopathy:    She has no cervical adenopathy.  Neurological: She is alert and oriented to person, place, and time.  Skin: Skin is warm and dry. No rash noted. She is not diaphoretic.  Psychiatric: She has a normal mood and affect. Her behavior is normal.   ALBUTEROL NEBULIZER ADMINISTERED.  POST-NEB EXAM: RR 20; SPEAKING COMFORTABLY WITHOUT RESPIRATORY DISTRESS; PERSISTENT WHEEZING THROUGHOUT; PULSE OXIMETRY 93%.    [facility] reading (PRIMARY) by  Dr. Katrinka Blazing.  CXR:  Hyperexpansion; elevated R hemidiaphragm.  No acute  process.  Results for orders placed in visit on 06/27/13  POCT CBC      Result Value Range   WBC 8.6  4.6 - 10.2 K/uL   Lymph, poc 2.6  0.6 - 3.4   POC LYMPH PERCENT 30.7  10 - 50 %L   MID (cbc) 0.7  0 - 0.9   POC MID % 8.3  0 - 12 %M   POC Granulocyte 5.2  2 - 6.9   Granulocyte percent 61.0  37 - 80 %G   RBC 4.72  4.04 - 5.48 M/uL   Hemoglobin 14.3  12.2 - 16.2 g/dL   HCT, POC 16.1  09.6 - 47.9 %   MCV 95.4  80 - 97 fL   MCH, POC 30.3  27 - 31.2 pg   MCHC 31.8  31.8 - 35.4 g/dL   RDW, POC 04.5     Platelet Count, POC 309  142 - 424 K/uL   MPV 8.9  0 - 99.8 fL    Assessment & Plan:  Shortness of breath - Plan: POCT CBC, EKG 12-Lead, DG Chest 2 View, EKG 12-Lead, albuterol (PROVENTIL) (2.5 MG/3ML) 0.083% nebulizer solution 2.5 mg, predniSONE (DELTASONE) 20 MG tablet  Respiratory distress  Cough - Plan: TB Skin Test, predniSONE (DELTASONE) 20 MG tablet  GERD (gastroesophageal reflux disease)  Allergic rhinitis  Bronchospasm, acute   1.  Respiratory distress:  New.  Mild to moderate; improvement with Albuterol nebulizer in office.  Close follow-up in 24 hours; to ED overnight if acutely worsens. 2.  Bronchospasm:  New.  Improvement with Albuterol nebulizer in office.  Rx for Prednisone taper to start today; start Albuterol HFA every six hours at home.  Close follow-up in 24 hours.  Recommend follow-up with pulmonology to discuss new onset bronchospasm. 3.  Chronic cough:  Worsening with acute bronchospasm.  S/p extensive work up.  Associated with 30 pounds weight loss and night sweats.  PPD placed in office; RTC 48-72 hours for read. Treat bronchospasm with Prednisone, albuterol.  Continue Dexilant and add Pepcid qhs for next two weeks.  Continue Zyrtec, Flonase for GERD.  Rx for Augmentin provided for sinusitis treatment. 4.  Allergic Rhinitis: moderately controlled; continue Zyrtec, Flonase; rx for Prednisone. 5.  GERD: uncontrolled; restarted Dexilant three  days ago.  Add  Pepcid qhs for two weeks and reevaluate.   6. CAD: stable; EKG stable; no evidence of volume overload and CXR without pulmonary edema; no suggestion of CHF contributing to cough.  Meds ordered this encounter  Medications  . albuterol (PROVENTIL) (2.5 MG/3ML) 0.083% nebulizer solution 2.5 mg    Sig:   . predniSONE (DELTASONE) 20 MG tablet    Sig: Take 3 tablets daily x 1 day, take 2 tablets daily x 5 days, then take one tablet daily x 5 days    Dispense:  18 tablet    Refill:  0    Order Specific Question:  Supervising Provider    Answer:  Rene Paci A [[id number]]  . amoxicillin-clavulanate (AUGMENTIN) 875-125 MG per tablet    Sig: Take 1 tablet by mouth 2 (two) times daily.    Dispense:  20 tablet    Refill:  0

## 2013-06-28 ENCOUNTER — Other Ambulatory Visit: Payer: Self-pay | Admitting: Internal Medicine

## 2013-06-30 ENCOUNTER — Ambulatory Visit (INDEPENDENT_AMBULATORY_CARE_PROVIDER_SITE_OTHER): Payer: Medicare Other | Admitting: Family Medicine

## 2013-06-30 DIAGNOSIS — Z111 Encounter for screening for respiratory tuberculosis: Secondary | ICD-10-CM | POA: Diagnosis not present

## 2013-06-30 LAB — TB SKIN TEST
Induration: 0 mm
TB Skin Test: NEGATIVE

## 2013-06-30 NOTE — Progress Notes (Signed)
  Subjective:    Patient ID: Dorothy Lee, female    DOB: 1941-09-22, 72 y.o.   MRN: [id number]  HPI tb read only    Review of Systems     Objective:   Physical Exam        Assessment & Plan:  TB skin test negative 0mm induration

## 2013-07-04 ENCOUNTER — Other Ambulatory Visit: Payer: Self-pay | Admitting: Cardiology

## 2013-07-04 DIAGNOSIS — Z79899 Other long term (current) drug therapy: Secondary | ICD-10-CM

## 2013-07-04 DIAGNOSIS — E7800 Pure hypercholesterolemia, unspecified: Secondary | ICD-10-CM

## 2013-07-06 ENCOUNTER — Telehealth: Payer: Self-pay | Admitting: Pulmonary Disease

## 2013-07-06 NOTE — Telephone Encounter (Signed)
Noted  

## 2013-07-06 NOTE — Telephone Encounter (Signed)
Patient Instructions    Get back on your nasal spray everyday!  Would stop using your inhaler. Please discuss a less expensive alternative medication for possible reflux with Dr. Russella Dar   I spoke with pt. She stated she has noticed the dexilant has caused diarrhea. She has not contact Dr. Ardell Isaacs office yet. She stated she will do so to see what else they recommend. I advised will forward to [facility] as an [place]

## 2013-07-08 ENCOUNTER — Other Ambulatory Visit: Payer: Self-pay | Admitting: Internal Medicine

## 2013-07-09 NOTE — Telephone Encounter (Signed)
Faxed script bck to [facility].../lmb 

## 2013-07-09 NOTE — Telephone Encounter (Signed)
MD out of office. Pls advise on refill.../lmb 

## 2013-07-17 ENCOUNTER — Other Ambulatory Visit: Payer: Medicare Other

## 2013-07-21 ENCOUNTER — Other Ambulatory Visit (INDEPENDENT_AMBULATORY_CARE_PROVIDER_SITE_OTHER): Payer: Medicare Other

## 2013-07-21 DIAGNOSIS — E7800 Pure hypercholesterolemia, unspecified: Secondary | ICD-10-CM | POA: Diagnosis not present

## 2013-07-21 DIAGNOSIS — Z79899 Other long term (current) drug therapy: Secondary | ICD-10-CM

## 2013-07-21 LAB — ALT: ALT: 17 U/L (ref 0–35)

## 2013-07-21 LAB — LIPID PANEL
Cholesterol: 202 mg/dL — ABNORMAL HIGH (ref 0–200)
Total CHOL/HDL Ratio: 4
VLDL: 28 mg/dL (ref 0.0–40.0)

## 2013-07-23 ENCOUNTER — Telehealth: Payer: Self-pay | Admitting: General Surgery

## 2013-07-23 DIAGNOSIS — E785 Hyperlipidemia, unspecified: Secondary | ICD-10-CM

## 2013-07-23 DIAGNOSIS — Z79899 Other long term (current) drug therapy: Secondary | ICD-10-CM

## 2013-07-23 NOTE — Telephone Encounter (Signed)
Message copied by Nita Sells on Thu Jul 23, 2013  2:53 PM ------      Message from: Quintella Reichert      Created: Tue Jul 21, 2013  4:46 PM       Please call lab to find out why LDL not calculated ------

## 2013-07-23 NOTE — Telephone Encounter (Signed)
LDL was 132 - will forward to [facility] for further evaluation

## 2013-07-23 NOTE — Progress Notes (Signed)
Spoke with Mellody Dance from the lab. He is adding this on for [place].

## 2013-07-23 NOTE — Telephone Encounter (Signed)
Message copied by Nita Sells on Thu Jul 23, 2013  2:52 PM ------      Message from: Quintella Reichert      Created: Tue Jul 21, 2013  4:46 PM       Please call lab to find out why LDL not calculated ------

## 2013-07-23 NOTE — Telephone Encounter (Signed)
Mellody Dance called and added on a LDL for [place]. TO Dr. Mayford Knife to make aware and await on LDL results.

## 2013-07-24 ENCOUNTER — Encounter: Payer: Self-pay | Admitting: Cardiology

## 2013-07-24 ENCOUNTER — Ambulatory Visit (INDEPENDENT_AMBULATORY_CARE_PROVIDER_SITE_OTHER): Payer: Medicare Other | Admitting: Cardiology

## 2013-07-24 VITALS — BP 120/80 | HR 88 | Ht 64.0 in | Wt 137.0 lb

## 2013-07-24 DIAGNOSIS — R0602 Shortness of breath: Secondary | ICD-10-CM | POA: Diagnosis not present

## 2013-07-24 DIAGNOSIS — Z23 Encounter for immunization: Secondary | ICD-10-CM

## 2013-07-24 DIAGNOSIS — Z0000 Encounter for general adult medical examination without abnormal findings: Secondary | ICD-10-CM

## 2013-07-24 DIAGNOSIS — I2510 Atherosclerotic heart disease of native coronary artery without angina pectoris: Secondary | ICD-10-CM

## 2013-07-24 DIAGNOSIS — I10 Essential (primary) hypertension: Secondary | ICD-10-CM

## 2013-07-24 DIAGNOSIS — E785 Hyperlipidemia, unspecified: Secondary | ICD-10-CM | POA: Diagnosis not present

## 2013-07-24 NOTE — Patient Instructions (Signed)
Your physician recommends that you continue on your current medications as directed. Please refer to the Current Medication list given to you today.   Your physician has requested that you have an echocardiogram. Echocardiography is a painless test that uses sound waves to create images of your heart. It provides your doctor with information about the size and shape of your heart and how well your heart's chambers and valves are working. This procedure takes approximately one hour. There are no restrictions for this procedure.  Your physician has requested that you have en exercise stress myoview. For further information please visit [email or web address] Please follow instruction sheet, as given.  We are going to refer you to Riki Rusk in our Lipid Clinic  Your physician wants you to follow-up in: 6 months You will receive a reminder letter in the mail two months in advance. If you don't receive a letter, please call our office to schedule the follow-up appointment.

## 2013-07-24 NOTE — Progress Notes (Signed)
[address] [place], [place]  [address] Phone: [phone] Fax:  [phone]  Date:  07/24/2013   ID:  Cleon, Thoma 07/26/1941, MRN [id number]  PCP:  Rene Paci, MD  Cardiologist:  Armanda Magic, MD     History of Present Illness: Dorothy Lee is a 72 y.o. female with a history of CAD, HTN, dyslipidemia and GERD presents today for followup.  She has had a lot of problems with GERD and is now on Dexilant.  She occasionally has some discomfort in the middle of her chest which is nonexertional.  It resolves after she takes a Pepcid.  She denies any  LE edema, dizziness, palpitations or syncope.  Her main complaint today is fatigue and leg fatigue.  She has chronic SOB and cough and is being evaluated by Dr. Shelle Iron for treatment of GERD/post nasal drip and cyclical cough.   Wt Readings from Last 3 Encounters:  06/25/13 135 lb 6 oz (61.406 kg)  06/22/13 137 lb 3.2 oz (62.234 kg)  06/16/13 136 lb (61.689 kg)     Past Medical History  Diagnosis Date  . History of breast cancer     right - s/p XRT 1984  . Hyperlipidemia   . GERD (gastroesophageal reflux disease)   . Urinary incontinence   . Scoliosis     with mild chronic LBP  . Allergic rhinitis   . ALLERGIC RHINITIS   . Cough     chronic  . HYPERTENSION, BORDERLINE   . ROTATOR CUFF SYNDROME, LEFT   . Diverticulosis   . Breast cancer   . Glaucoma   . CAD (coronary artery disease)     DES RCA and LAD 02/2008, cath 11/2012 patent RCA and LAD stents with new mild LAD stenosis s/p DES    Current Outpatient Prescriptions  Medication Sig Dispense Refill  . albuterol (PROVENTIL HFA;VENTOLIN HFA) 108 (90 BASE) MCG/ACT inhaler Inhale 2 puffs into the lungs every 6 (six) hours as needed for wheezing.  18 g  1  . amoxicillin-clavulanate (AUGMENTIN) 875-125 MG per tablet Take 1 tablet by mouth 2 (two) times daily.  20 tablet  0  . aspirin EC 81 MG tablet Take 81 mg by mouth every morning.      . benzonatate  (TESSALON) 200 MG capsule take 1 capsule by mouth three times a day before meals  90 capsule  1  . cetirizine (ZYRTEC) 10 MG tablet Take 10 mg by mouth every morning.      . clopidogrel (PLAVIX) 75 MG tablet Take 75 mg by mouth every morning.       Marland Kitchen DEXILANT 60 MG capsule       . famotidine (PEPCID) 20 MG tablet take 1 tablet by mouth twice a day  60 tablet  5  . fluticasone (FLONASE) 50 MCG/ACT nasal spray Place 2 sprays into the nose daily as needed for rhinitis or allergies.      Marland Kitchen HYDROcodone-homatropine (HYCODAN) 5-1.5 MG/5ML syrup take 5 milliliters (1 teaspoonful) by mouth every 6 hours if needed for cough  240 mL  1  . nitroGLYCERIN (NITROSTAT) 0.4 MG SL tablet Place 0.4 mg under the tongue every 5 (five) minutes x 3 doses as needed for chest pain. May repeat x3      . predniSONE (DELTASONE) 20 MG tablet Take 3 tablets daily x 1 day, take 2 tablets daily x 5 days, then take one tablet daily x 5 days  18 tablet  0  .  raloxifene (EVISTA) 60 MG tablet Take 60 mg by mouth every other day.       . rosuvastatin (CRESTOR) 10 MG tablet Take 10 mg by mouth every morning.       . traMADol (ULTRAM) 50 MG tablet take 1 tablet by mouth every 8 hours if needed  30 tablet  0   Current Facility-Administered Medications  Medication Dose Route Frequency Provider Last Rate Last Dose  . albuterol (PROVENTIL) (2.5 MG/3ML) 0.083% nebulizer solution 2.5 mg  2.5 mg Nebulization Once Ethelda Chick, MD        Allergies:    Allergies  Allergen Reactions  . Lisinopril Cough    Social History:  The patient  reports that she has never smoked. She has never used smokeless tobacco. She reports that she does not drink alcohol or use illicit drugs.   Family History:  The patient's family history includes Emphysema in her brother and father; Heart disease in her brother; Lung cancer in her brother; Throat cancer in her brother.   ROS:  Please see the history of present illness.      All other systems reviewed and  negative.   PHYSICAL EXAM: VS:  There were no vitals taken for this visit. Well nourished, well developed, in no acute distress HEENT: normal Neck: no JVD Cardiac:  normal S1, S2; RRR; no murmur Lungs:  clear to auscultation bilaterally, no wheezing, rhonchi or rales Abd: soft, nontender, no hepatomegaly Ext: no edema Skin: warm and dry Neuro:  CNs 2-12 intact, no focal abnormalities noted       ASSESSMENT AND PLAN:  1. CAD with increased SOB which is probably due to GERD but need to consider anginal equivalent.  She also is having exertional fatigue.  Her last stent was in  February 2014 so she could have restenosis.  - I will get a stress myoview to rule out ischemia   - check 2D echo to assess for diastolic dysfunction  - continue ASA/Plavix 2. HTN 3. Dyslipidemia  - continue crestor   - her LDL was not at goal when checked this week ([id number]) - will refer to lipid clinic  Followup with me in 6 months  Signed, Armanda Magic, MD 07/24/2013 2:34 PM

## 2013-07-27 NOTE — Telephone Encounter (Signed)
See below note: add metamucil 1 tablespoon mixed in 8 ounces of water or juice twice daily.  Continue Crestor 10 mg qd.  Recheck lipid panel and hepatic panel in 6 months. Please notify patient, update meds, and set up labs. Thank you.

## 2013-07-27 NOTE — Telephone Encounter (Signed)
RF:  CAD, HTN, age - LDL goal < 100 preferably at least given baseline LDL off meds is 240 mg/dL. Meds:  Crestor 10 mg qd. LDL is 132 mg/dL currently.  LDL was 109 earlier this year on same dose. Intolerant:  Crestor 20 mg (myalgias), Simvastatin (myalgias), Lipitor 40 mg (fatigue). Can't afford Zetia.   Doesn't qualify for any clinical trial currently given only can tolerate Crestor 10 mg (need higher dose to qualify). LDL off meds is 240 mg/dL, and she has gotten a 54% reduction with Crestor 10 mg daily in past (which is great). Plan: 1.  Continue Crestor 10 mg qd. 2.  Add Metamucil

## 2013-07-29 MED ORDER — PSYLLIUM 28 % PO PACK: 1.0000 | PACK | Freq: Two times a day (BID) | ORAL | Status: DC

## 2013-07-29 NOTE — Telephone Encounter (Signed)
Pt is aware and med list updated. Lab orders placed as well.

## 2013-07-29 NOTE — Addendum Note (Signed)
Addended by: Nita Sells on: 07/29/2013 01:58 PM   Modules accepted: Orders

## 2013-08-05 ENCOUNTER — Ambulatory Visit
Admission: RE | Admit: 2013-08-05 | Discharge: 2013-08-05 | Disposition: A | Discharge status: Home / Self Care | Payer: Medicare Other | Source: Ambulatory Visit | Attending: Internal Medicine | Admitting: Internal Medicine

## 2013-08-05 DIAGNOSIS — Z853 Personal history of malignant neoplasm of breast: Secondary | ICD-10-CM | POA: Diagnosis not present

## 2013-08-05 DIAGNOSIS — Z1231 Encounter for screening mammogram for malignant neoplasm of breast: Secondary | ICD-10-CM

## 2013-08-05 DIAGNOSIS — Z9889 Other specified postprocedural states: Secondary | ICD-10-CM

## 2013-08-10 ENCOUNTER — Other Ambulatory Visit (HOSPITAL_COMMUNITY): Payer: Medicare Other

## 2013-08-11 ENCOUNTER — Ambulatory Visit (INDEPENDENT_AMBULATORY_CARE_PROVIDER_SITE_OTHER): Payer: Medicare Other | Admitting: Pharmacist

## 2013-08-11 ENCOUNTER — Ambulatory Visit (HOSPITAL_COMMUNITY): Payer: Medicare Other | Attending: Cardiology

## 2013-08-11 ENCOUNTER — Other Ambulatory Visit: Payer: Self-pay | Admitting: *Deleted

## 2013-08-11 VITALS — Wt 137.0 lb

## 2013-08-11 DIAGNOSIS — H409 Unspecified glaucoma: Secondary | ICD-10-CM | POA: Insufficient documentation

## 2013-08-11 DIAGNOSIS — I059 Rheumatic mitral valve disease, unspecified: Secondary | ICD-10-CM | POA: Insufficient documentation

## 2013-08-11 DIAGNOSIS — R0609 Other forms of dyspnea: Secondary | ICD-10-CM | POA: Insufficient documentation

## 2013-08-11 DIAGNOSIS — R0602 Shortness of breath: Secondary | ICD-10-CM | POA: Diagnosis not present

## 2013-08-11 DIAGNOSIS — I10 Essential (primary) hypertension: Secondary | ICD-10-CM | POA: Insufficient documentation

## 2013-08-11 DIAGNOSIS — R0989 Other specified symptoms and signs involving the circulatory and respiratory systems: Secondary | ICD-10-CM | POA: Insufficient documentation

## 2013-08-11 DIAGNOSIS — I079 Rheumatic tricuspid valve disease, unspecified: Secondary | ICD-10-CM | POA: Diagnosis not present

## 2013-08-11 DIAGNOSIS — Z79899 Other long term (current) drug therapy: Secondary | ICD-10-CM

## 2013-08-11 DIAGNOSIS — E785 Hyperlipidemia, unspecified: Secondary | ICD-10-CM

## 2013-08-11 DIAGNOSIS — C50919 Malignant neoplasm of unspecified site of unspecified female breast: Secondary | ICD-10-CM | POA: Diagnosis not present

## 2013-08-11 DIAGNOSIS — I2510 Atherosclerotic heart disease of native coronary artery without angina pectoris: Secondary | ICD-10-CM | POA: Diagnosis not present

## 2013-08-11 MED ORDER — EZETIMIBE 10 MG PO TABS: 10.0000 mg | ORAL_TABLET | Freq: Every day | ORAL | Status: DC

## 2013-08-11 MED ORDER — TRAMADOL HCL 50 MG PO TABS: 50.0000 mg | ORAL_TABLET | Freq: Four times a day (QID) | ORAL | Status: DC | PRN

## 2013-08-11 NOTE — Telephone Encounter (Signed)
Faxed script back to [facility]...lmb 

## 2013-08-11 NOTE — Assessment & Plan Note (Addendum)
Patient's elevated LDL is likely secondary to genetics given baseline LDL > 200 mg/dL and multiple siblings with h/o CAD.  Patient needs to be on a potent/daily statin.  Given she failed Crestor 20 mg in the past, patient is against re-challenging this again.  She couldn't take Zetia in the past due to cost, but coverage for Zetia has improved since that time.  Will try to get her on Zetia 10 mg qd, but if this is too expensive, will have her start taking viscous fiber (i.e. Metamucil) twice daily in hopes of lowering LDL via GI as well.  Patient agreeable to this.  Will send in prescription for Zetia, and if it costs too much she will instead take Metamucil and will let me know.  Patient will continue low fat diet, and will try to get back into the gym once her coughing has resolved.    Plan: 1.  Continue Crestor 10 mg once daily. 2.  Add Zetia 10 mg qd today as cost likely much lower.  If too expensive, then instead use Metamucil twice daily (1 tablespoon of metamucil power mixed in 8 ounces of water and drink twice daily).  3.  Call Riki Rusk at [phone] and let him know if you're taking Zetia or Metamucil. 4.  Continue low fat diet. 5.  If coughing resolves, start going back to the gym again. 6.  In 3 months will recheck cholesterol and see Riki Rusk the next day to review (early February).

## 2013-08-11 NOTE — Patient Instructions (Signed)
Plan: 1.  Continue Crestor 10 mg once daily. 2.  Add Zetia 10 mg today as cost likely much lower.  If too expensive, then instead use Metamucil twice daily (1 tablespoon of metamucil power mixed in 8 ounces of water and drink twice daily).  3.  Call Riki Rusk at [phone] and let him know if you're taking Zetia or Metamucil. 4.  Continue low fat diet. 5.  If coughing resolves, start going back to the gym again. 6.  In 3 months will recheck cholesterol and see Riki Rusk the next day to review.

## 2013-08-11 NOTE — Progress Notes (Signed)
Echocardiogram performed.  

## 2013-08-11 NOTE — Progress Notes (Signed)
Patient with h/o CAD (PCI 2009 and again in 2014) who has a baseline LDL as high as 240 mg/dL in 2956.  She has had failed multiple statins due to side effects, and currently LDL is 132 mg/dL on Crestor 10 mg qd.  She is tolerating Crestor 10 mg well currently.  She failed Crestor 20 mg in the past.  Risk Factors:  CAD (PCI 2009 and 2014) - LDL goal < 70 mg/dL if possible, but would like to get LDL to at least < 100 mg/dL which would be over a 21% reduction from baseline (240 mg/d).  Non-HDL goal at least < 130 mg/dL preferable. Meds:  Crestor 10 mg qd. Intolerant:  Simvastatin, Lipitor 40 mg (fatigue), Crestor 20 mg (myalgias). Zetia was too expensive in the past ($80), but this was years ago.  Insurance coverage has improved since then.  Welchol would likely be an issue with cost as well.  Family History:  Patient has 3 brothers with CAD (died at age 47, 7, and 87).  Sister with h/o CABG.  Mother- no heart disease.  Father- heavy smoker and died of emphysema.   Social History:  Never used tobacco.  Rarely drinks alcohol (only social use)  Diet:  Patient eats a low fat diet, and typically only eats 2 meals per day.  Breakfast is typically toast and coffee.  Afternoon meal is chicken and vegetable.  She eats out periodically on the weekends.  Doesn't drink soda or tea.  Drinks water mostly.  Rarely eats desserts or sweets.  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  She has taken prednisone and Dexilant for this in the past.  Labs:   07/2013 - LDL 132, non-HDL 152, TC 202, TG 140, HDL 50, ALT normal (on Crestor 10 mg qd).    In 2006 LDL was 240 mg/dL (off meds) In 12/863 LDL was 180 mg/dL and had recently run out of Crestor prior to labwork.  Current Outpatient Prescriptions  Medication Sig Dispense Refill  . albuterol (PROVENTIL HFA;VENTOLIN HFA) 108 (90 BASE) MCG/ACT  inhaler Inhale 2 puffs into the lungs every 6 (six) hours as needed for wheezing.  18 g  1  . amoxicillin-clavulanate (AUGMENTIN) 875-125 MG per tablet Take 1 tablet by mouth 2 (two) times daily.  20 tablet  0  . aspirin EC 81 MG tablet Take 81 mg by mouth every morning.      . benzonatate (TESSALON) 200 MG capsule take 1 capsule by mouth three times a day before meals  90 capsule  1  . cetirizine (ZYRTEC) 10 MG tablet Take 10 mg by mouth every morning.      . clopidogrel (PLAVIX) 75 MG tablet Take 75 mg by mouth every morning.       Marland Kitchen DEXILANT 60 MG capsule       . ezetimibe (ZETIA) 10 MG tablet Take 1 tablet (10 mg total) by mouth daily.  30 tablet  11  . famotidine (PEPCID) 20 MG tablet take 1 tablet by mouth twice a day  60 tablet  5  . fluticasone (FLONASE) 50 MCG/ACT nasal spray Place 2 sprays into the nose daily as needed for rhinitis or allergies.      Marland Kitchen HYDROcodone-homatropine (HYCODAN) 5-1.5 MG/5ML syrup take 5 milliliters (1 teaspoonful) by mouth every 6 hours if needed for cough  240 mL  1  . nitroGLYCERIN (NITROSTAT) 0.4 MG SL tablet Place 0.4 mg under the tongue every 5 (five) minutes x 3 doses as needed for chest pain. May repeat x3      . predniSONE (DELTASONE) 20 MG tablet Take 3 tablets daily x 1 day, take 2 tablets daily x 5 days, then take one tablet daily x 5 days  18 tablet  0  . psyllium (METAMUCIL SMOOTH TEXTURE) 28 % packet Take 1 packet by mouth 2 (two) times daily.      . raloxifene (EVISTA) 60 MG tablet Take 60 mg by mouth every other day.       . rosuvastatin (CRESTOR) 10 MG tablet Take 10 mg by mouth every morning.       . traMADol (ULTRAM) 50 MG tablet take 1 tablet by mouth every 8 hours if needed  30 tablet  0   Current Facility-Administered Medications  Medication Dose Route Frequency Provider Last Rate Last Dose  . albuterol (PROVENTIL) (2.5 MG/3ML) 0.083% nebulizer solution 2.5 mg  2.5 mg Nebulization Once Ethelda Chick, MD       Allergies  Allergen  Reactions  . Crestor [Rosuvastatin]     Crestor 20 mg caused muscle aches  . Lipitor [Atorvastatin]     Atorvastatin 40 mg caused fatigue  . Lisinopril Cough  . Simvastatin     Muscle aches   Family History  Problem Relation Age of Onset  . Emphysema Father   . Emphysema Brother   . Heart disease Brother     x3 brothers  . Lung cancer Brother   . Throat cancer Brother

## 2013-08-14 ENCOUNTER — Other Ambulatory Visit: Payer: Self-pay

## 2013-08-14 NOTE — Telephone Encounter (Signed)
This is a chronic problem. I advised to followup with pulmonary for same. Okay for refill on cough syrup -please print and I will sign

## 2013-08-14 NOTE — Addendum Note (Signed)
Addended by: Rene Paci A on: 08/14/2013 06:05 PM   Modules accepted: Orders

## 2013-08-14 NOTE — Telephone Encounter (Signed)
Patient called triage line requesting a type of cough medication because she has been having trouble with a cough for a couple weeks.

## 2013-08-17 ENCOUNTER — Encounter (HOSPITAL_COMMUNITY): Payer: Medicare Other

## 2013-08-17 ENCOUNTER — Telehealth: Payer: Self-pay | Admitting: Gastroenterology

## 2013-08-17 MED ORDER — HYDROCODONE-HOMATROPINE 5-1.5 MG/5ML PO SYRP: ORAL_SOLUTION | ORAL | Status: DC

## 2013-08-17 NOTE — Addendum Note (Signed)
Addended by: Deatra James on: 08/17/2013 11:39 AM   Modules accepted: Orders

## 2013-08-17 NOTE — Telephone Encounter (Signed)
Patient feels that the SOB she is experiencing is coming from Dexilant.  She stopped the Dexilant for a few weeks and she needed to resume due to severe cough she attributes to GERD, when she resumed about 2 weeks ago the SOB returned. She went to the urgent care over the weekend due to SOB.  She has a chronic cough for about the last 2 years, without a PPI it is worse.  Is there an alternate PPI she can take? She is also asked to make an appt with her primary care due to worsening cough and being out of her nighttime cough syrup.

## 2013-08-17 NOTE — Telephone Encounter (Signed)
Patient advised, she wants to finish Dexilant since Dr. Russella Dar thinks is unlikely to be from Dexilant.  She will call back if she changes her mind

## 2013-08-17 NOTE — Telephone Encounter (Signed)
Unlikely SOB is from Dexilant however we can change to Nexium 40 mg bid.

## 2013-08-19 ENCOUNTER — Encounter: Payer: Self-pay | Admitting: Internal Medicine

## 2013-08-19 ENCOUNTER — Ambulatory Visit (INDEPENDENT_AMBULATORY_CARE_PROVIDER_SITE_OTHER): Payer: Medicare Other | Admitting: Internal Medicine

## 2013-08-19 VITALS — BP 138/72 | HR 105 | Temp 97.8°F | Wt 136.4 lb

## 2013-08-19 DIAGNOSIS — J329 Chronic sinusitis, unspecified: Secondary | ICD-10-CM | POA: Diagnosis not present

## 2013-08-19 DIAGNOSIS — K219 Gastro-esophageal reflux disease without esophagitis: Secondary | ICD-10-CM

## 2013-08-19 DIAGNOSIS — M19019 Primary osteoarthritis, unspecified shoulder: Secondary | ICD-10-CM | POA: Diagnosis not present

## 2013-08-19 DIAGNOSIS — R059 Cough, unspecified: Secondary | ICD-10-CM

## 2013-08-19 DIAGNOSIS — R05 Cough: Secondary | ICD-10-CM | POA: Diagnosis not present

## 2013-08-19 MED ORDER — PREDNISONE 20 MG PO TABS: ORAL_TABLET | ORAL | Status: DC

## 2013-08-19 MED ORDER — FLUTICASONE PROPIONATE 50 MCG/ACT NA SUSP: 2.0000 | Freq: Every day | NASAL | Status: DC

## 2013-08-19 NOTE — Assessment & Plan Note (Signed)
Severe, recurrent and chronic symptoms Evaluation by pulmonary and ENT reviewed - did not pursue a second opinion evaluation at [facility] as referred September 2014 Reports compliance with antihistamine, nasal steroid, and PPI and H2 blocker Also ongoing cough suppression with hydrocodone syrup and tramadol will treat acute exacerbation with prednisone taper as she has done well with this in past No evidence for infection so we'll hold empiric antibiotics at this time Refer back to pulmonary to consider methacholine challenge Holding on inhalers at this time because of potential irritant exacerbating symptoms

## 2013-08-19 NOTE — Progress Notes (Signed)
Subjective:    Patient ID: Dorothy Lee, female    DOB: 11-15-1940, 72 y.o.   MRN: [id number]  Cough Associated symptoms include chills and shortness of breath. Pertinent negatives include no chest pain, ear pain, fever, headaches, postnasal drip, rhinorrhea or wheezing.    Patient here today for follow up of cough.  This has been an ongoing problem for 3 years.  It has been felt in the past that it is related to reflux.  She has been evaluated by pulmonary, GI, and ENT.  She states that cough worsened when she stopped Dexilant.  She has been back on same for 3 weeks now, but without significant improvement.  She chose not to be evaluated at [facility] in September 2014.  She reports compliance with current medical therapy.  Cough is non productive.  Some associated shortness of breath (normal echo by cardiology 06/2013).  She denies fevers, but states she has had chills (negative TB skin test 06/2013).    Past Medical History  Diagnosis Date  . History of breast cancer     right - s/p XRT 1984  . Hyperlipidemia   . GERD (gastroesophageal reflux disease)   . Urinary incontinence   . Scoliosis     with mild chronic LBP  . Allergic rhinitis   . ALLERGIC RHINITIS   . Cough     chronic  . HYPERTENSION, BORDERLINE   . ROTATOR CUFF SYNDROME, LEFT   . Diverticulosis   . Breast cancer   . Glaucoma   . CAD (coronary artery disease)     DES RCA and LAD 02/2008, cath 11/2012 patent RCA and LAD stents with new mild LAD stenosis s/p DES     Review of Systems  Constitutional: Positive for chills. Negative for fever, activity change and appetite change.  HENT: Negative for congestion, ear pain, postnasal drip, rhinorrhea and sinus pressure.   Respiratory: Positive for cough and shortness of breath. Negative for choking, chest tightness and wheezing.   Cardiovascular: Negative for chest pain, palpitations and leg swelling.  Gastrointestinal: Negative for nausea, vomiting, diarrhea and constipation.   Neurological: Negative for dizziness, seizures, syncope, numbness and headaches.       Objective:   Physical Exam  Constitutional: She is oriented to person, place, and time. She appears well-developed and well-nourished. No distress.  HENT:  Head: Normocephalic and atraumatic.  Right Ear: External ear normal.  Left Ear: External ear normal.  Nose: Nose normal.  Mouth/Throat: Oropharynx is clear and moist. No oropharyngeal exudate.  Neck: Normal range of motion. Neck supple. No thyromegaly present.  Cardiovascular: Regular rhythm and normal heart sounds.   No extrasystoles are present. Tachycardia present.   Pulmonary/Chest: Effort normal and breath sounds normal. No respiratory distress. She has no wheezes.  Lymphadenopathy:    She has no cervical adenopathy.  Neurological: She is alert and oriented to person, place, and time.  Skin: Skin is warm and dry. She is not diaphoretic.  Psychiatric: She has a normal mood and affect. Her behavior is normal. Judgment and thought content normal.    Wt Readings from Last 3 Encounters:  08/19/13 136 lb 6.4 oz (61.871 kg)  08/11/13 137 lb (62.143 kg)  07/24/13 137 lb (62.143 kg)   BP Readings from Last 3 Encounters:  08/19/13 138/72  07/24/13 120/80  06/27/13 140/92   Lab Results  Component Value Date   WBC 8.6 06/27/2013   HGB 14.3 06/27/2013   HCT 45.0 06/27/2013   PLT 349 12/03/2012  GLUCOSE 84 05/07/2013   CHOL 202* 07/21/2013   TRIG 140.0 07/21/2013   HDL 49.70 07/21/2013   LDLDIRECT 132.4 07/21/2013   LDLCALC 107* 12/02/2012   ALT 17 07/21/2013   AST 18 12/01/2012   NA 141 05/07/2013   K 3.8 05/07/2013   CL 107 05/07/2013   CREATININE 0.9 05/07/2013   BUN 11 05/07/2013   CO2 29 05/07/2013   TSH 0.77 12/07/2008   INR 0.99 12/01/2012   HGBA1C 6.1* 12/01/2012       Assessment & Plan:  See problem list -

## 2013-08-19 NOTE — Assessment & Plan Note (Signed)
PPI changed by GI 03/2012 from nexium to dexilant due to chronic uncontrolled cough  Also on max H2B continue present plan and medications.

## 2013-08-19 NOTE — Patient Instructions (Signed)
It was good to see you today.  We have reviewed your prior records including labs and tests today  Resume prednisone taper and flonase nose spray daily - Your prescription(s) have been submitted to your pharmacy. Please take as directed and contact our office if you believe you are having problem(s) with the medication(s).  Continue benzonate for cough, hydrocodone syrup for cough, tramadol for cough, Dexon for reflux, Pepcid for reflux, and Zyrtec for allergies in addition to the prednisone and nose spray  we'll make referral to Dr. Shelle Iron for followup on possible asthma variant. Our office will contact you regarding appointment(s) once made.

## 2013-08-19 NOTE — Assessment & Plan Note (Signed)
Since cough symptoms unimproved after pulmonary and GI evaluation with max med treatment as described above S/p repeat ENT eval [place] for same  resume nasal steroid now

## 2013-08-19 NOTE — Progress Notes (Signed)
Pre-visit discussion using our clinic review tool. No additional management support is needed unless otherwise documented below in the visit note.  

## 2013-08-20 ENCOUNTER — Other Ambulatory Visit: Payer: Self-pay | Admitting: *Deleted

## 2013-08-20 MED ORDER — BENZONATATE 200 MG PO CAPS: ORAL_CAPSULE | ORAL | Status: DC

## 2013-08-20 NOTE — Telephone Encounter (Signed)
Last filled 06/23/13

## 2013-08-24 ENCOUNTER — Encounter: Payer: Self-pay | Admitting: Internal Medicine

## 2013-08-24 ENCOUNTER — Ambulatory Visit (INDEPENDENT_AMBULATORY_CARE_PROVIDER_SITE_OTHER): Payer: Medicare Other | Admitting: Internal Medicine

## 2013-08-24 VITALS — BP 130/90 | HR 90 | Temp 98.1°F | Ht 63.0 in | Wt 135.2 lb

## 2013-08-24 DIAGNOSIS — R05 Cough: Secondary | ICD-10-CM

## 2013-08-24 MED ORDER — FAMOTIDINE 20 MG PO TABS: ORAL_TABLET | ORAL | Status: DC

## 2013-08-24 NOTE — Patient Instructions (Addendum)
Dexilant 60  Mg Take 30-60 min before first meal of the day  And at bedtime Pepcid 20 mg and chlortrimeton 4 mg until return  Stop zyrtec   GERD (REFLUX)  is an extremely common cause of respiratory symptoms, many times with no significant heartburn at all.    It can be treated with medication, but also with lifestyle changes including avoidance of late meals, excessive alcohol, smoking cessation, and avoid fatty foods, chocolate, peppermint, colas, red wine, and acidic juices such as orange juice.  NO MINT OR MENTHOL PRODUCTS SO NO COUGH DROPS  USE SUGARLESS CANDY INSTEAD (jolley ranchers or Stover's)  NO OIL BASED VITAMINS - use powdered substitutes.  For cough you can take the cough syrup or tramadol as needed   For flare return here with all medications in hand including all over the counters all inhalers

## 2013-08-24 NOTE — Progress Notes (Addendum)
Subjective:     Patient ID: Dorothy Lee, female   DOB: 07-09-1941  MRN: [id number]  HPI  1 yowf never smoker with recurrent cough x 3 years responsive to prednisone for weeks at a time s the need for cough meds referred for consultation by Dr Bayard Hugger having seen Dr Shelle Iron previoiusly with dx of pnds/cyclical coughing but requesting a second opinion as cough never resolved.  08/24/2013 1st [facility] office visit/ Dorothy Lee  Chief Complaint  Patient presents with  . Acute Visit    Pt states cough no better since last ov here in Sept. Cough is occ prod with foamy, white sputum.   no better with abx/ ent intervention/ always better with prednisone.  Presently finishing up round of abx/prednisone and "it's going away like it always does on prednisone" Tends to be worse when lies down with persistent sense of pnds which can disturb sleep as well but amount of mucus does not peak in ams and never purulent. No better/ worse on inhalers Assoc with urinary incont when severe  No obvious day to day or daytime variabilty or assoc sob (unless coughing) cp or chest tightness, subjective wheeze overt sinus or hb symptoms. No unusual exp hx or h/o childhood pna/ asthma or knowledge of premature birth.    Also denies any obvious fluctuation of symptoms with weather or environmental changes or other aggravating or alleviating factors except as outlined above   Current Medications, Allergies, Complete Past Medical History, Past Surgical History, Family History, and Social History were reviewed in [employer] record.  ROS  The following are not active complaints unless bolded sore throat, dysphagia, dental problems, itching, sneezing,  nasal congestion or excess/ purulent secretions, ear ache,   fever, chills, sweats, unintended wt loss, pleuritic or exertional cp, hemoptysis,  orthopnea pnd or leg swelling, presyncope, palpitations, heartburn, abdominal pain, anorexia, nausea,  vomiting, diarrhea  or change in bowel or urinary habits, change in stools or urine, dysuria,hematuria,  rash, arthralgias, visual complaints, headache, numbness weakness or ataxia or problems with walking or coordination,  change in mood/affect or memory.         Review of Systems     Objective:   Physical Exam    amb hoarse wf nad  Wt Readings from Last 3 Encounters:  08/24/13 135 lb 3.2 oz (61.326 kg)  08/19/13 136 lb 6.4 oz (61.871 kg)  08/11/13 137 lb (62.143 kg)     HEENT: nl dentition, turbinates, and orophanx. Nl external ear canals without cough reflex   NECK :  without JVD/Nodes/TM/ nl carotid upstrokes bilaterally   LUNGS: no acc muscle use, clear to A and P bilaterally without cough on insp or exp maneuvers   CV:  RRR  no s3 or murmur or increase in P2, no edema   ABD:  soft and nontender with nl excursion in the supine position. No bruits or organomegaly, bowel sounds nl  MS:  warm without deformities, calf tenderness, cyanosis or clubbing  SKIN: warm and dry without lesions    NEURO:  alert, approp, no deficits    cxr 06/27/13 No acute cardiopulmonary disease identified   Assessment:

## 2013-08-26 NOTE — Assessment & Plan Note (Addendum)
Dorothy Lee and cxr 11/2010 normal Ct sinuses 05/2011:  Acute and chronic sinusitis. Full PFT's 11/2011:  Normal  CT sinuses 11/2012:  Thickening of sinuses without significant fluid.  Followed by ENT [name])  Although the response to prednisone suggests eos rhinitis/ bronchitis/ Cough variant asthma, and keeping in mind that Chronic cough is often simultaneously caused by more than one condition. A single cause has been found from 38 to 82% of the time, multiple causes from 18 to 62%. Multiply caused cough has been the result of three diseases up to 42% of the time,  [name]   the persistent throat clearing x 3 years  Strongly supports  Upper airway cough syndrome, so named because it's frequently impossible to sort out how much is  CR/sinusitis with freq throat clearing (which can be related to primary GERD)   vs  causing  secondary (" extra esophageal")  GERD from wide swings in gastric pressure that occur with throat clearing, often  promoting self use of mint and menthol lozenges that reduce the lower esophageal sphincter tone and exacerbate the problem further in a cyclical fashion.

## 2013-08-28 ENCOUNTER — Telehealth: Payer: Self-pay | Admitting: Internal Medicine

## 2013-08-28 MED ORDER — HYDROCODONE-HOMATROPINE 5-1.5 MG/5ML PO SYRP: ORAL_SOLUTION | ORAL | Status: DC

## 2013-08-28 NOTE — Telephone Encounter (Signed)
Pt aware and rx printed for pt to pick up

## 2013-08-28 NOTE — Telephone Encounter (Signed)
My understanding is the cough was resolving so now that she needs more cough med ok to refill x one but then return in 2 weeks if not 100% better off all cough suppression with all meds in hand

## 2013-08-28 NOTE — Telephone Encounter (Signed)
Last OV 08/24/13 No Pending OV Last fill 08/14/13 #259mL  MW - please advise on refill. Thanks.

## 2013-09-01 ENCOUNTER — Ambulatory Visit (INDEPENDENT_AMBULATORY_CARE_PROVIDER_SITE_OTHER): Payer: Medicare Other | Admitting: Critical Care Medicine

## 2013-09-01 ENCOUNTER — Telehealth: Payer: Self-pay | Admitting: Internal Medicine

## 2013-09-01 ENCOUNTER — Encounter: Payer: Self-pay | Admitting: Critical Care Medicine

## 2013-09-01 VITALS — BP 128/80 | HR 106 | Temp 98.5°F | Ht 63.0 in | Wt 131.8 lb

## 2013-09-01 DIAGNOSIS — J329 Chronic sinusitis, unspecified: Secondary | ICD-10-CM

## 2013-09-01 DIAGNOSIS — R05 Cough: Secondary | ICD-10-CM | POA: Diagnosis not present

## 2013-09-01 MED ORDER — BENZONATATE 200 MG PO CAPS: ORAL_CAPSULE | ORAL | Status: DC

## 2013-09-01 MED ORDER — PREDNISONE 10 MG PO TABS: ORAL_TABLET | ORAL | Status: DC

## 2013-09-01 MED ORDER — FLUTICASONE PROPIONATE 50 MCG/ACT NA SUSP: 2.0000 | Freq: Two times a day (BID) | NASAL | Status: DC

## 2013-09-01 MED ORDER — HYDROCODONE-HOMATROPINE 5-1.5 MG/5ML PO SYRP: ORAL_SOLUTION | ORAL | Status: DC

## 2013-09-01 MED ORDER — AMOXICILLIN-POT CLAVULANATE 875-125 MG PO TABS: 1.0000 | ORAL_TABLET | Freq: Two times a day (BID) | ORAL | Status: DC

## 2013-09-01 NOTE — Progress Notes (Signed)
Subjective:    Patient ID: Dorothy Lee, female    DOB: 1941-03-16, 72 y.o.   MRN: [id number]  HPI 57 yowf never smoker with recurrent cough x 3 years responsive to prednisone for weeks at a time s the need for cough meds referred for consultation by Dr Bayard Hugger having seen Dr Shelle Iron previoiusly with dx of pnds/cyclical coughing but requesting a second opinion as cough never resolved.  08/24/2013 1st  Pulmonary office visit/ [facility]  Chief Complaint  Patient presents with  . Acute Visit    Pt states cough no better since last ov here in Sept. Cough is occ prod with foamy, white sputum.   no better with abx/ ent intervention/ always better with prednisone.  Presently finishing up round of abx/prednisone and "it's going away like it always does on prednisone" Tends to be worse when lies down with persistent sense of pnds which can disturb sleep as well but amount of mucus does not peak in ams and never purulent. No better/ worse on inhalers Assoc with urinary incont when severe  09/01/2013 Chief Complaint  Patient presents with  . Acute Visit    Cough with white and yellow mucus  Pt saw MW 11/17 and rec Cleda Daub and cxr 11/2010 normal  Ct sinuses 05/2011: Acute and chronic sinusitis.  Full PFT's 11/2011: Normal  CT sinuses 11/2012: Thickening of sinuses without significant fluid.  Followed by ENT [name])  Although the response to prednisone suggests eos rhinitis/ bronchitis/ Cough variant asthma, and keeping in mind that Chronic cough is often simultaneously caused by more than one condition. A single cause has been found from 38 to 82% of the time, multiple causes from 18 to 62%. Multiply caused cough has been the result of three diseases up to 42% of the time, [name] the persistent throat clearing x 3 years  Strongly supports Upper airway cough syndrome, so named because it's frequently impossible to sort out how much is CR/sinusitis with freq throat clearing (which can be related  to primary GERD) vs causing secondary (" extra esophageal") GERD from wide swings in gastric pressure that occur with throat clearing, often promoting self use of mint and menthol lozenges that reduce the lower esophageal sphincter tone and exacerbate the problem further in a cyclical fashion.   Dexilant 60 Mg Take 30-60 min before first meal of the day And at bedtime Pepcid 20 mg and chlortrimeton 4 mg until return  Stop zyrtec   Cough worse, now thick yellow. No real heartburn. No dyspnea.  Produces mucus all the time.  Cough for 60yrs.  ABX and pred helps.   Pt remains weak.  Coughs all night. Sits up on side of bed. Cough med is chronic.         Review of Systems Constitutional:   No  weight loss, night sweats,  Fevers, chills, fatigue, lassitude. HEENT:   No headaches,  Difficulty swallowing,  Tooth/dental problems,  Sore throat,                No sneezing, itching, ear ache, nasal congestion, post nasal drip,   CV:  No chest pain,  Orthopnea, PND, swelling in lower extremities, anasarca, dizziness, palpitations  GI  No heartburn, indigestion, abdominal pain, nausea, vomiting, diarrhea, change in bowel habits, loss of appetite  Resp: No shortness of breath with exertion or at rest.  No excess mucus, no productive cough,  No non-productive cough,  No coughing up of blood.  No change in color  of mucus.  No wheezing.  No chest wall deformity  Skin: no rash or lesions.  GU: no dysuria, change in color of urine, no urgency or frequency.  No flank pain.  MS:  No joint pain or swelling.  No decreased range of motion.  No back pain.  Psych:  No change in mood or affect. No depression or anxiety.  No memory loss.     Objective:   Physical Exam Filed Vitals:   09/01/13 1456  BP: 128/80  Pulse: 106  Temp: 98.5 F (36.9 C)  TempSrc: Oral  Height: 5\' 3"  (1.6 m)  Weight: 131 lb 12.8 oz (59.784 kg)  SpO2: 96%    Gen: Pleasant, well-nourished, in no distress,  normal  affect  ENT: No lesions,  mouth clear,  oropharynx with some degree of erythema, bilateral nares show some degree of purulence and severe inflammation  Neck: No JVD, no TMG, no carotid bruits  Lungs: No use of accessory muscles, no dullness to percussion, prominent pseudo-wheeze but no evidence of distal airway obstruction  Cardiovascular: RRR, heart sounds normal, no murmur or gallops, no peripheral edema  Abdomen: soft and NT, no HSM,  BS normal  Musculoskeletal: No deformities, no cyanosis or clubbing  Neuro: alert, non focal  Skin: Warm, no lesions or rashes  No results found.   The patient's entire attic chart was reviewed with multiple prior visits and studies reviewed as well     Assessment & Plan:   Cough This patient is seen as a work in visit for persistent cough. The patient was seen a week ago in the office because of ongoing cough and was given a program to focus on reflux disease and postnasal drip syndrome. Despite the use of the chlorpheniramine and the reflux medications the patient's cough continues. She is here to regroup on a work in basis. Note there didn't several CT scans of the sinuses done previously showing some degrees of sinusitis the last CT scan of sinuses in February 2014. She is not seen ENT in many months. She's had normal spirometry and chest radiographs over time. In my opinion this patient likely has upper airway instability with chronic sinusitis with postnasal drainage and upper airway irritation. There is no evidence of lower airway inflammation during this visit. Also this patient does not have any evidence of intrathoracic upper airway obstruction on recent pulmonary function testing. Patient also likely has reflux disease with ongoing issues. The patient seemed to have poor understanding on how to properly follow a standard cough protocol at this visit.  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 Also she is incessantly using her voice during this time period and this may be contributing to an exacerbating her cough syndrome Plan Strict reflux diet Stay on Dexilant one daily and pepcid at night Prednisone 10mg  Take 4 for four days 3 for four days 2 for four days 1 for four days Augmentin twice daily for 10days Flonase two puff ea nostril Twice daily Stay on chlorpheniramine Follow cough protocol using Hycodan cough syrup and Tessalon Perles in sequence along with a sugar free candy drop and complete voice rest Stay off work ONE week, voice rest CT Sinus  Return 2 weeks Dr Sherene Sires recheck    Updated Medication List Outpatient Encounter Prescriptions as of 09/01/2013  Medication Sig  . albuterol (PROVENTIL HFA;VENTOLIN HFA) 108 (90 BASE) MCG/ACT inhaler Inhale  2 puffs into the lungs every 6 (six) hours as needed for wheezing.  Marland Kitchen aspirin EC 81 MG tablet Take 81 mg by mouth every morning.  . benzonatate (TESSALON) 200 MG capsule Take 1-2 every 4-6 hours per cough protocol  . clopidogrel (PLAVIX) 75 MG tablet Take 75 mg by mouth every morning.   Marland Kitchen DEXILANT 60 MG capsule Take 60 mg by mouth daily.   Marland Kitchen ezetimibe (ZETIA) 10 MG tablet Take 1 tablet (10 mg total) by mouth daily.  . famotidine (PEPCID) 20 MG tablet One at bedtime  . fluticasone (FLONASE) 50 MCG/ACT nasal spray Place 2 sprays into both nostrils 2 (two) times daily.  Marland Kitchen HYDROcodone-homatropine (HYCODAN) 5-1.5 MG/5ML syrup take 5 ML every 4-6 hours per cough protocol  . nitroGLYCERIN (NITROSTAT) 0.4 MG SL tablet Place 0.4 mg under the tongue every 5 (five) minutes x 3 doses as needed for chest pain. May repeat x3  . psyllium (METAMUCIL SMOOTH TEXTURE) 28 % packet Take 1 packet by mouth 2 (two) times daily.  . raloxifene (EVISTA) 60 MG tablet Take 60 mg by mouth every other day.   . rosuvastatin (CRESTOR) 10 MG tablet Take 10 mg by mouth every morning.   . traMADol (ULTRAM) 50 MG tablet  Take 1 tablet (50 mg total) by mouth every 6 (six) hours as needed for moderate pain.  . [DISCONTINUED] benzonatate (TESSALON) 200 MG capsule take 1 capsule by mouth three times a day before meals  . [DISCONTINUED] fluticasone (FLONASE) 50 MCG/ACT nasal spray Place 2 sprays into both nostrils daily.  . [DISCONTINUED] HYDROcodone-homatropine (HYCODAN) 5-1.5 MG/5ML syrup take 5 milliliters (1 teaspoonful) by mouth every 6 hours if needed for cough  . amoxicillin-clavulanate (AUGMENTIN) 875-125 MG per tablet Take 1 tablet by mouth 2 (two) times daily.  . predniSONE (DELTASONE) 10 MG tablet Take 4 for four days 3 for four days 2 for four days 1 for four days  . [DISCONTINUED] predniSONE (DELTASONE) 20 MG tablet Take 3 tablets daily x 1 day, take 2 tablets daily x 5 days, then take one tablet daily x 5 days

## 2013-09-01 NOTE — Telephone Encounter (Signed)
I called and spoke with pt. She scheduled an appt to see PW this afternoon

## 2013-09-01 NOTE — Patient Instructions (Signed)
Strict reflux diet Stay on Dexilant one daily and pepcid at night Prednisone 10mg  Take 4 for four days 3 for four days 2 for four days 1 for four days Augmentin twice daily for 10days Flonase two puff ea nostril Twice daily Stay on chlorpheniramine Follow cough protocol Stay off work ONE week, voice rest CT Sinus  Return 2 weeks Dr Sherene Sires recheck

## 2013-09-02 NOTE — Assessment & Plan Note (Signed)
This patient is seen as a work in visit for persistent cough. The patient was seen a week ago in the office because of ongoing cough and was given a program to focus on reflux disease and postnasal drip syndrome. Despite the use of the chlorpheniramine and the reflux medications the patient's cough continues. She is here to regroup on a work in basis. Note there didn't several CT scans of the sinuses done previously showing some degrees of sinusitis the last CT scan of sinuses in February 2014. She is not seen ENT in many months. She's had normal spirometry and chest radiographs over time. In my opinion this patient likely has upper airway instability with chronic sinusitis with postnasal drainage and upper airway irritation. There is no evidence of lower airway inflammation during this visit. Also this patient does not have any evidence of intrathoracic upper airway obstruction on recent pulmonary function testing. Patient also likely has reflux disease with ongoing issues. The patient seemed to have poor understanding on how to properly follow a standard cough protocol at this visit.  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 Also she is incessantly using her voice during this time period and this may be contributing to an exacerbating her cough syndrome Plan Strict reflux diet Stay on Dexilant one daily and pepcid at night Prednisone 10mg  Take 4 for four days 3 for four days 2 for four days 1 for four days Augmentin twice daily for 10days Flonase two puff ea nostril Twice daily Stay on chlorpheniramine Follow cough protocol using Hycodan cough syrup and Tessalon Perles in sequence along with a sugar free candy drop and complete voice rest Stay off work ONE week, voice rest CT Sinus  Return 2 weeks Dr Sherene Sires recheck

## 2013-09-08 ENCOUNTER — Other Ambulatory Visit: Payer: Medicare Other

## 2013-09-15 ENCOUNTER — Ambulatory Visit: Payer: Medicare Other | Admitting: Internal Medicine

## 2013-09-21 ENCOUNTER — Other Ambulatory Visit: Payer: Medicare Other

## 2013-09-22 ENCOUNTER — Encounter: Payer: Self-pay | Admitting: Internal Medicine

## 2013-09-22 ENCOUNTER — Ambulatory Visit (INDEPENDENT_AMBULATORY_CARE_PROVIDER_SITE_OTHER): Payer: Medicare Other | Admitting: Internal Medicine

## 2013-09-22 VITALS — BP 132/82 | HR 101 | Temp 98.9°F | Ht 63.0 in | Wt 130.4 lb

## 2013-09-22 DIAGNOSIS — R05 Cough: Secondary | ICD-10-CM | POA: Diagnosis not present

## 2013-09-22 MED ORDER — FLUTICASONE PROPIONATE 50 MCG/ACT NA SUSP: 2.0000 | Freq: Two times a day (BID) | NASAL | Status: DC

## 2013-09-22 NOTE — Patient Instructions (Addendum)
Continue flonase one twice daily   Please see patient coordinator before you leave today  to schedule a methacholine challenge test  Now while you are better   Call if you start to flare for Sinus Ct  [phone] ask for [facility] and schedule a visit with all your medications in hand in two bags - automatics vs backups  Continue the chlortrimeton 4 mg at bedtime along with pepcid 20 mg at bedtime

## 2013-09-22 NOTE — Progress Notes (Signed)
Subjective:    Patient ID: Windell Norfolk, female    DOB: [birth year]    MRN: [id number]  Brief patient profile:  14 yowf never smoker with recurrent cough x 3 years responsive to prednisone for weeks at a time s the need for cough meds (during the day, still tessalon at hs)  referred for consultation by Dr Bayard Hugger having seen Dr Shelle Iron previously with dx of pnds/cyclical coughing but requesting a second opinion as cough never resolved.  08/24/2013 1st [facility] office visit/ Nakeita Styles  Chief Complaint  Patient presents with  . Acute Visit    Pt states cough no better since last ov here in Sept. Cough is occ prod with foamy, white sputum.   no better with abx/ ent intervention/ always better with prednisone.  Presently finishing up round of abx/prednisone and "it's going away like it always does on prednisone" Tends to be worse when lies down with persistent sense of pnds which can disturb sleep as well but amount of mucus does not peak in ams and never purulent. No better/ worse on inhalers Assoc with urinary incont when severe rec Dexilant 60  Mg Take 30-60 min before first meal of the day  And at bedtime Pepcid 20 mg and chlortrimeton 4 mg until return Stop zyrtec  GERD (REFLUX)   For cough you can take the cough syrup or tramadol as needed  For flare return here with all medications in hand including all over the counters all inhalers    09/01/2013 Chief Complaint  Patient presents with  . Acute Visit    Cough with white and yellow mucus  Pt saw MW 11/17 and rec Cleda Daub and cxr 11/2010 normal  Ct sinuses 05/2011: Acute and chronic sinusitis.  Full PFT's 11/2011: Normal  Dexilant 60 Mg Take 30-60 min before first meal of the day And at bedtime Pepcid 20 mg and chlortrimeton 4 mg until return  Stop zyrtec  Cough worse, now thick yellow. No real heartburn. No dyspnea.  Produces mucus all the time.  rec Strict reflux diet Stay on Dexilant one daily and pepcid at night Prednisone  10mg  Take 4 for four days 3 for four days 2 for four days 1 for four days Augmentin twice daily for 10days Flonase two puff ea nostril Twice daily Stay on chlorpheniramine Follow cough protocol Stay off work ONE week, voice rest CT Sinus > declined   09/23/2013 f/u ov/[facility] re:  cough x 3 year Chief Complaint  Patient presents with  . Follow-up    Cough is much improved since her last visit. No new co's today.She is taking tessalon mainly just at night.   No obvious pattern in  day to day or daytime variabilty (happens just as often away from work) or assoc sob  or cp or chest tightness, subjective wheeze overt sinus or hb symptoms. No unusual exp hx or h/o childhood pna/ asthma or knowledge of premature birth.  Sleeping ok without nocturnal  or early am exacerbation  of respiratory  c/o's or need for noct saba. Also denies any obvious fluctuation of symptoms with weather or environmental changes or other aggravating or alleviating factors except as outlined above   Current Medications, Allergies, Complete Past Medical History, Past Surgical History, Family History, and Social History were reviewed in [employer] record.  ROS  The following are not active complaints unless bolded sore throat, dysphagia, dental problems, itching, sneezing,  nasal congestion or excess/ purulent secretions, ear ache,  fever, chills, sweats, unintended wt loss, pleuritic or exertional cp, hemoptysis,  orthopnea pnd or leg swelling, presyncope, palpitations, heartburn, abdominal pain, anorexia, nausea, vomiting, diarrhea  or change in bowel or urinary habits, change in stools or urine, dysuria,hematuria,  rash, arthralgias, visual complaints, headache, numbness weakness or ataxia or problems with walking or coordination,  change in mood/affect or memory.                      Objective:   Physical Exam   Wt Readings from Last 3 Encounters:  09/22/13 130 lb 6.4 oz (59.149 kg)   09/01/13 131 lb 12.8 oz (59.784 kg)  08/24/13 135 lb 3.2 oz (61.326 kg)       HEENT: nl dentition, turbinates, and orophanx. Nl external ear canals without cough reflex   NECK :  without JVD/Nodes/TM/ nl carotid upstrokes bilaterally   LUNGS: no acc muscle use, clear to A and P bilaterally without cough on insp or exp maneuvers   CV:  RRR  no s3 or murmur or increase in P2, no edema   ABD:  soft and nontender with nl excursion in the supine position. No bruits or organomegaly, bowel sounds nl  MS:  warm without deformities, calf tenderness, cyanosis or clubbing  SKIN: warm and dry without lesions    NEURO:  alert, approp, no deficits      cxr 06/27/13 No acute cardiopulmonary disease identified.     Assessment & Plan:

## 2013-09-23 NOTE — Assessment & Plan Note (Signed)
Dorothy Lee and cxr 11/2010 normal Ct sinuses 05/2011:  Acute and chronic sinusitis. Full PFT's 11/2011:  Normal  CT sinuses 11/2012:  Thickening of sinuses without significant fluid.  Followed by ENT [name]) - Rec repeat CT 09/02/13 by Dr Delford Field > declined  - MCT rec 09/22/2013 >>>   ddx is between cough variant asthma and  Classic Upper airway cough syndrome, so named because it's frequently impossible to sort out how much is  CR/sinusitis with freq throat clearing (which can be related to primary GERD)   vs  causing  secondary (" extra esophageal")  GERD from wide swings in gastric pressure that occur with throat clearing, often  promoting self use of mint and menthol lozenges that reduce the lower esophageal sphincter tone and exacerbate the problem further in a cyclical fashion.   These are the same pts (now being labeled as having "irritable larynx syndrome" by some cough centers) who not infrequently have a history of having failed to tolerate ace inhibitors,  dry powder inhalers or biphosphonates or report having atypical reflux symptoms that don't respond to standard doses of PPI , and are easily confused as having aecopd or asthma flares by even experienced allergists/ pulmonologists.   Needs mct to clear this up noting that cronic cough is often simultaneously caused by more than one condition. A single cause has been found from 38 to 82% of the time, multiple causes from 18 to 62%. Multiply caused cough has been the result of three diseases up to 42% of the time.   See instructions for specific recommendations which were reviewed directly with the patient who was given a copy with highlighter outlining the key components.

## 2013-09-24 ENCOUNTER — Other Ambulatory Visit: Payer: Self-pay | Admitting: Internal Medicine

## 2013-09-25 NOTE — Telephone Encounter (Signed)
Faxed script back to [facility]...lmb 

## 2013-09-29 ENCOUNTER — Encounter: Payer: Self-pay | Admitting: Internal Medicine

## 2013-09-29 ENCOUNTER — Ambulatory Visit (INDEPENDENT_AMBULATORY_CARE_PROVIDER_SITE_OTHER): Payer: Medicare Other | Admitting: Internal Medicine

## 2013-09-29 ENCOUNTER — Other Ambulatory Visit (INDEPENDENT_AMBULATORY_CARE_PROVIDER_SITE_OTHER): Payer: Medicare Other

## 2013-09-29 VITALS — BP 120/70 | HR 94 | Temp 97.2°F | Wt 131.8 lb

## 2013-09-29 DIAGNOSIS — R634 Abnormal weight loss: Secondary | ICD-10-CM | POA: Diagnosis not present

## 2013-09-29 DIAGNOSIS — R739 Hyperglycemia, unspecified: Secondary | ICD-10-CM

## 2013-09-29 DIAGNOSIS — R7309 Other abnormal glucose: Secondary | ICD-10-CM

## 2013-09-29 DIAGNOSIS — R05 Cough: Secondary | ICD-10-CM | POA: Diagnosis not present

## 2013-09-29 DIAGNOSIS — Z853 Personal history of malignant neoplasm of breast: Secondary | ICD-10-CM | POA: Diagnosis not present

## 2013-09-29 DIAGNOSIS — R059 Cough, unspecified: Secondary | ICD-10-CM

## 2013-09-29 DIAGNOSIS — E785 Hyperlipidemia, unspecified: Secondary | ICD-10-CM

## 2013-09-29 MED ORDER — EZETIMIBE 10 MG PO TABS: 10.0000 mg | ORAL_TABLET | Freq: Every day | ORAL | Status: DC

## 2013-09-29 NOTE — Progress Notes (Signed)
Pre-visit discussion using our clinic review tool. No additional management support is needed unless otherwise documented below in the visit note.  

## 2013-09-29 NOTE — Patient Instructions (Signed)
It was good to see you today.  We have reviewed your prior records including labs and tests today  Test(s) ordered today. Your results will be released to MyChart (or called to you) after review, usually within 72hours after test completion. If any changes need to be made, you will be notified at that same time.  we'll make referral for CT of chest to evaluate cough and weight loss symptoms. Our office will contact you regarding appointment(s) once made.  Medications reviewed and updated, hold Zetia until further notice - no other changes recommended at this time.  Please schedule followup in 3-4 months to recheck weight loss, call sooner if problems.

## 2013-09-29 NOTE — Progress Notes (Signed)
Subjective:    Patient ID: Dorothy Lee, female    DOB: 01-11-1941, 72 y.o.   MRN: [id number]  Cough This is a chronic problem. The problem has been gradually improving. The cough is non-productive. Associated symptoms include weight loss (30# since 10/2011, unintentional). Pertinent negatives include no chest pain, chills, ear pain, fever, headaches, hemoptysis, nasal congestion, postnasal drip, rhinorrhea, shortness of breath or wheezing.    Past Medical History  Diagnosis Date  . History of breast cancer     right - s/p XRT 1984  . Hyperlipidemia   . GERD (gastroesophageal reflux disease)   . Urinary incontinence   . Scoliosis     with mild chronic LBP  . Allergic rhinitis   . ALLERGIC RHINITIS   . Cough     chronic  . HYPERTENSION, BORDERLINE   . ROTATOR CUFF SYNDROME, LEFT   . Diverticulosis   . Breast cancer   . Glaucoma   . CAD (coronary artery disease)     DES RCA and LAD 02/2008, cath 11/2012 patent RCA and LAD stents with new mild LAD stenosis s/p DES     Review of Systems  Constitutional: Positive for weight loss (30# since 10/2011, unintentional) and fatigue. Negative for fever, chills, activity change and appetite change.  HENT: Negative for congestion, ear pain, postnasal drip, rhinorrhea and sinus pressure.   Respiratory: Positive for cough. Negative for hemoptysis, choking, chest tightness, shortness of breath and wheezing.   Cardiovascular: Negative for chest pain, palpitations and leg swelling.  Gastrointestinal: Negative for nausea, vomiting, diarrhea and constipation.  Neurological: Negative for dizziness, seizures, syncope, numbness and headaches.       Objective:   Physical Exam BP 120/70  Pulse 94  Temp(Src) 97.2 F (36.2 C) (Oral)  Wt 131 lb 12.8 oz (59.784 kg)  SpO2 96%  Wt Readings from Last 3 Encounters:  09/29/13 131 lb 12.8 oz (59.784 kg)  09/22/13 130 lb 6.4 oz (59.149 kg)  09/01/13 131 lb 12.8 oz (59.784 kg)  11/05/11 158  lb  Constitutional: She appears well-developed and well-nourished. No distress. Neck: Normal range of motion. Neck supple. No JVD present. No thyromegaly present.  Cardiovascular: Normal rate, regular rhythm and normal heart sounds.  No murmur heard. No BLE edema. Pulmonary/Chest: Effort normal and breath sounds normal. No respiratory distress. She has no wheezes.  Psychiatric: She has a normal mood and affect. Her behavior is normal. Judgment and thought content normal.    Lab Results  Component Value Date   WBC 8.6 06/27/2013   HGB 14.3 06/27/2013   HCT 45.0 06/27/2013   PLT 349 12/03/2012   GLUCOSE 84 05/07/2013   CHOL 202* 07/21/2013   TRIG 140.0 07/21/2013   HDL 49.70 07/21/2013   LDLDIRECT 132.4 07/21/2013   LDLCALC 107* 12/02/2012   ALT 17 07/21/2013   AST 18 12/01/2012   NA 141 05/07/2013   K 3.8 05/07/2013   CL 107 05/07/2013   CREATININE 0.9 05/07/2013   BUN 11 05/07/2013   CO2 29 05/07/2013   TSH 0.77 12/07/2008   INR 0.99 12/01/2012   HGBA1C 6.1* 12/01/2012       Assessment & Plan:    Cough, currently improved under care of pulmonary mgmt -   However, given 30# unintentional weight loss in past 2 years, will check CT chest  Fatigue - nonspecific symptoms/exam - check screening labs   mammo is UTD (reviewed hx breast ca 1984) Colo UTD Check TSH and a1c  Also see  problem list. Medications and labs reviewed today.

## 2013-09-29 NOTE — Assessment & Plan Note (Signed)
Poorly tolerant of many statins and previous trials Recommended to begin Zetia by pharmacist through cardiology management Since taking same, increased myalgias Advised patient to hold on same until further notice but to continue with Crestor as tolerated

## 2013-10-12 ENCOUNTER — Encounter (HOSPITAL_COMMUNITY): Payer: Medicare Other

## 2013-10-19 ENCOUNTER — Other Ambulatory Visit: Payer: Self-pay | Admitting: Internal Medicine

## 2013-10-20 NOTE — Telephone Encounter (Signed)
Faxed script back to [facility]...lmb 

## 2013-11-09 ENCOUNTER — Other Ambulatory Visit: Payer: Medicare Other

## 2013-11-09 ENCOUNTER — Other Ambulatory Visit (INDEPENDENT_AMBULATORY_CARE_PROVIDER_SITE_OTHER): Payer: Medicare Other

## 2013-11-09 DIAGNOSIS — Z79899 Other long term (current) drug therapy: Secondary | ICD-10-CM

## 2013-11-09 DIAGNOSIS — E785 Hyperlipidemia, unspecified: Secondary | ICD-10-CM

## 2013-11-09 LAB — LDL CHOLESTEROL, DIRECT: LDL DIRECT: 144.4 mg/dL

## 2013-11-09 LAB — HEPATIC FUNCTION PANEL
ALK PHOS: 56 U/L (ref 39–117)
ALT: 22 U/L (ref 0–35)
AST: 23 U/L (ref 0–37)
Albumin: 3.5 g/dL (ref 3.5–5.2)
BILIRUBIN DIRECT: 0 mg/dL (ref 0.0–0.3)
BILIRUBIN TOTAL: 0.9 mg/dL (ref 0.3–1.2)
Total Protein: 6.2 g/dL (ref 6.0–8.3)

## 2013-11-09 LAB — LIPID PANEL
Cholesterol: 202 mg/dL — ABNORMAL HIGH (ref 0–200)
HDL: 48.2 mg/dL (ref >39.00)
Total CHOL/HDL Ratio: 4
Triglycerides: 125 mg/dL (ref 0.0–149.0)
VLDL: 25 mg/dL (ref 0.0–40.0)

## 2013-11-10 ENCOUNTER — Ambulatory Visit: Payer: Medicare Other | Admitting: Pharmacist

## 2013-11-11 ENCOUNTER — Encounter: Payer: Self-pay | Admitting: General Surgery

## 2013-11-11 ENCOUNTER — Other Ambulatory Visit: Payer: Self-pay | Admitting: General Surgery

## 2013-11-12 ENCOUNTER — Ambulatory Visit (INDEPENDENT_AMBULATORY_CARE_PROVIDER_SITE_OTHER): Payer: Medicare Other | Admitting: Internal Medicine

## 2013-11-12 ENCOUNTER — Encounter: Payer: Self-pay | Admitting: Internal Medicine

## 2013-11-12 VITALS — BP 130/90 | HR 95 | Temp 98.4°F | Wt 134.0 lb

## 2013-11-12 DIAGNOSIS — R05 Cough: Secondary | ICD-10-CM

## 2013-11-12 DIAGNOSIS — R634 Abnormal weight loss: Secondary | ICD-10-CM | POA: Diagnosis not present

## 2013-11-12 DIAGNOSIS — R059 Cough, unspecified: Secondary | ICD-10-CM | POA: Diagnosis not present

## 2013-11-12 MED ORDER — HYDROCODONE-HOMATROPINE 5-1.5 MG/5ML PO SYRP: ORAL_SOLUTION | ORAL | Status: DC

## 2013-11-12 MED ORDER — TRAMADOL HCL 50 MG PO TABS: 50.0000 mg | ORAL_TABLET | Freq: Four times a day (QID) | ORAL | Status: DC | PRN

## 2013-11-12 NOTE — Progress Notes (Signed)
Subjective:    Patient ID: Dorothy Lee, female    DOB: 1941/05/18, 73 y.o.   MRN: [id number]  Cough This is a chronic problem. The current episode started 1 to 4 weeks ago (chornic, but recent flare in past 2 weeks after running out of Dexilant (off PPI  x 2 weeks)). The problem has been unchanged. The problem occurs every few minutes. The cough is non-productive. Associated symptoms include weight loss (30# since 10/2011, unintentional). Pertinent negatives include no chest pain, chills, ear pain, fever, headaches, heartburn, hemoptysis, myalgias, nasal congestion, postnasal drip, rhinorrhea, sore throat, shortness of breath or wheezing.    Past Medical History  Diagnosis Date  . History of breast cancer     right - s/p XRT 1984, surg, no chemo  . Hyperlipidemia   . GERD (gastroesophageal reflux disease)   . Urinary incontinence   . Scoliosis     with mild chronic LBP  . Allergic rhinitis   . ALLERGIC RHINITIS   . Cough     chronic  . HYPERTENSION, BORDERLINE   . ROTATOR CUFF SYNDROME, LEFT   . Diverticulosis   . Glaucoma   . CAD (coronary artery disease)     DES RCA and LAD 02/2008, cath 11/2012 patent RCA and LAD stents with new mild LAD stenosis s/p DES     Review of Systems  Constitutional: Positive for weight loss (30# since 10/2011, unintentional) and fatigue. Negative for fever, chills, activity change and appetite change.  HENT: Negative for congestion, ear pain, postnasal drip, rhinorrhea, sinus pressure and sore throat.   Respiratory: Positive for cough. Negative for hemoptysis, choking, chest tightness, shortness of breath and wheezing.   Cardiovascular: Negative for chest pain, palpitations and leg swelling.  Gastrointestinal: Negative for heartburn, nausea, vomiting, diarrhea and constipation.  Musculoskeletal: Negative for myalgias.  Neurological: Negative for dizziness, seizures, syncope, numbness and headaches.       Objective:   Physical Exam BP 130/90   Pulse 95  Temp(Src) 98.4 F (36.9 C) (Oral)  Wt 134 lb (60.782 kg)  SpO2 92%  Wt Readings from Last 3 Encounters:  11/12/13 134 lb (60.782 kg)  09/29/13 131 lb 12.8 oz (59.784 kg)  09/22/13 130 lb 6.4 oz (59.149 kg)  11/05/11 158 lb  Constitutional: She appears well-developed and well-nourished. No distress. Neck: Normal range of motion. Neck supple. No JVD present. No thyromegaly present.  Cardiovascular: Normal rate, regular rhythm and normal heart sounds.  No murmur heard. No BLE edema. Pulmonary/Chest: Effort normal and breath sounds normal. No respiratory distress. She has no wheezes.  Psychiatric: She has a normal mood and affect. Her behavior is normal. Judgment and thought content normal.    Lab Results  Component Value Date   WBC 8.6 06/27/2013   HGB 14.3 06/27/2013   HCT 45.0 06/27/2013   PLT 349 12/03/2012   GLUCOSE 84 05/07/2013   CHOL 202* 11/09/2013   TRIG 125.0 11/09/2013   HDL 48.20 11/09/2013   LDLDIRECT 144.4 11/09/2013   LDLCALC 107* 12/02/2012   ALT 22 11/09/2013   AST 23 11/09/2013   NA 141 05/07/2013   K 3.8 05/07/2013   CL 107 05/07/2013   CREATININE 0.9 05/07/2013   BUN 11 05/07/2013   CO2 29 05/07/2013   TSH 2.28 09/29/2013   INR 0.99 12/01/2012   HGBA1C 6.1 09/29/2013       Assessment & Plan:    Cough, chronic - prev improved under care of pulmonary mgmt - exacerbated by temporary  discontinuation of PPI, but now on same therapy again  Problem List Items Addressed This Visit   Cough - Primary     Severe, recurrent and chronic symptoms Evaluation by pulmonary and ENT reviewed - did not pursue a second opinion evaluation at [facility] as referred September 2014 Reports compliance with antihistamine, nasal steroid, and PPI and H2 blocker Also ongoing cough suppression with hydrocodone syrup and tramadol will treat acute exacerbation  No prednisone taper at this past No evidence for infection so we'll hold empiric antibiotics at this time Holding on inhalers at this  time because of potential irritant exacerbating symptoms    Weight loss, unintentional      Start weight 10/2011: 158# Wt Readings from Last 3 Encounters:  11/12/13 134 lb (60.782 kg)  09/29/13 131 lb 12.8 oz (59.784 kg)  09/22/13 130 lb 6.4 oz (59.149 kg)   mammo is UTD (reviewed hx breast ca 1984) Colo UTD 09/2013 TSH and a1c normal Patient declined CT chest as ordered December 2014, revisit need for same at future date if continued problems

## 2013-11-12 NOTE — Patient Instructions (Signed)
It was good to see you today.  We have reviewed your prior records including labs and tests today  Medications reviewed and updated,  Refill on medication(s) as discussed today.  Please keep schedule followup as planned to recheck weight loss, call sooner if problems.

## 2013-11-12 NOTE — Assessment & Plan Note (Signed)
Severe, recurrent and chronic symptoms Evaluation by pulmonary and ENT reviewed - did not pursue a second opinion evaluation at [facility] as referred September 2014 Reports compliance with antihistamine, nasal steroid, and PPI and H2 blocker Also ongoing cough suppression with hydrocodone syrup and tramadol will treat acute exacerbation  No prednisone taper at this past No evidence for infection so we'll hold empiric antibiotics at this time Holding on inhalers at this time because of potential irritant exacerbating symptoms

## 2013-11-12 NOTE — Assessment & Plan Note (Signed)
Start weight 10/2011: 158# Wt Readings from Last 3 Encounters:  11/12/13 134 lb (60.782 kg)  09/29/13 131 lb 12.8 oz (59.784 kg)  09/22/13 130 lb 6.4 oz (59.149 kg)   mammo is UTD (reviewed hx breast ca 1984) Colo UTD 09/2013 TSH and a1c normal Patient declined CT chest as ordered December 2014, revisit need for same at future date if continued problems

## 2013-11-12 NOTE — Progress Notes (Signed)
Pre-visit discussion using our clinic review tool. No additional management support is needed unless otherwise documented below in the visit note.  

## 2013-11-16 ENCOUNTER — Encounter: Payer: Self-pay | Admitting: Internal Medicine

## 2013-11-16 ENCOUNTER — Encounter (INDEPENDENT_AMBULATORY_CARE_PROVIDER_SITE_OTHER): Payer: Self-pay

## 2013-11-16 ENCOUNTER — Ambulatory Visit (INDEPENDENT_AMBULATORY_CARE_PROVIDER_SITE_OTHER): Payer: Medicare Other | Admitting: Internal Medicine

## 2013-11-16 VITALS — BP 130/78 | HR 95 | Temp 97.6°F | Ht 63.0 in | Wt 131.8 lb

## 2013-11-16 DIAGNOSIS — R059 Cough, unspecified: Secondary | ICD-10-CM

## 2013-11-16 DIAGNOSIS — R058 Other specified cough: Secondary | ICD-10-CM

## 2013-11-16 DIAGNOSIS — R05 Cough: Secondary | ICD-10-CM

## 2013-11-16 MED ORDER — PANTOPRAZOLE SODIUM 40 MG PO TBEC: 40.0000 mg | DELAYED_RELEASE_TABLET | Freq: Every day | ORAL | Status: DC

## 2013-11-16 NOTE — Progress Notes (Signed)
Subjective:    Patient ID: Dorothy Lee, female    DOB: 1941/04/29    MRN: [id number]  Brief patient profile:  86 yowf never smoker with recurrent cough x 3 years responsive to prednisone for weeks at a time s the need for cough meds (during the day, still tessalon at hs)  referred for consultation by Dr Basilio Cairo having seen Dr Gwenette Greet previously with dx of pnds/cyclical coughing but requesting a second opinion as cough never resolved.   History of Present Illness  08/24/2013 1st [facility] office visit/ Dorothy Lee  Chief Complaint  Patient presents with  . Acute Visit    Pt states cough no better since last ov here in Sept. Cough is occ prod with foamy, white sputum.   no better with abx/ ent intervention/ always better with prednisone.  Presently finishing up round of abx/prednisone and "it's going away like it always does on prednisone" Tends to be worse when lies down with persistent sense of pnds which can disturb sleep as well but amount of mucus does not peak in ams and never purulent. No better/ worse on inhalers Assoc with urinary incont when severe rec Dexilant 60  Mg Take 30-60 min before first meal of the day  And at bedtime Pepcid 20 mg and chlortrimeton 4 mg until return Stop zyrtec  GERD (REFLUX)   For cough you can take the cough syrup or tramadol as needed  For flare return here with all medications in hand including all over the counters all inhalers    09/01/2013 Chief Complaint  Patient presents with  . Acute Visit    Cough with white and yellow mucus  Pt saw MW 11/17 and rec Arlyce Harman and cxr 11/2010 normal  Ct sinuses 05/2011: Acute and chronic sinusitis.  Full PFT's 11/2011: Normal  Dexilant 60 Mg Take 30-60 min before first meal of the day And at bedtime Pepcid 20 mg and chlortrimeton 4 mg until return  Stop zyrtec  Cough worse, now thick yellow. No real heartburn. No dyspnea.  Produces mucus all the time.  rec Strict reflux diet Stay on Dexilant one daily  and pepcid at night Prednisone 10mg  Take 4 for four days 3 for four days 2 for four days 1 for four days Augmentin twice daily for 10days Flonase two puff ea nostril Twice daily Stay on chlorpheniramine Follow cough protocol Stay off work ONE week, voice rest CT Sinus > declined   09/23/2013 f/u ov/Dorothy Lee re:  cough x 3 year Chief Complaint  Patient presents with  . Follow-up    Cough is much improved since her last visit. No new co's today.She is taking tessalon mainly just at night.  rec Continue flonase one twice daily  Please see patient coordinator before you leave today  to schedule a methacholine challenge test  Now while you are better > not done  Call if you start to flare for Sinus Ct  [phone] ask for [facility] and schedule a visit with all your medications in hand in two bags - automatics vs backups Continue the chlortrimeton 4 mg at bedtime along with pepcid 20 mg at bedtime    11/16/2013 f/u ov/Dorothy Lee re: recurrent cough  When stopped dexilant / did not bring meds  Chief Complaint  Patient presents with  . Cough    Cough x2 weeks -- Was off Dexilant x2 weeks due to insurance and has been back on it now for 15 days--States though cough returned due to stopping medication for  2 weeks  was fine as long as on dexilant, fine back on it but not sure she can afford taking it. When not on it cough and wheezing requing freq saba, now not needing at all    Sleeping ok without nocturnal  or early am exacerbation  of respiratory  c/o's or need for noct saba. Also denies any obvious fluctuation of symptoms with weather or environmental changes or other aggravating or alleviating factors except as outlined above   Current Medications, Allergies, Complete Past Medical History, Past Surgical History, Family History, and Social History were reviewed in [employer] record.  ROS  The following are not active complaints unless bolded sore throat, dysphagia, dental problems,  itching, sneezing,  nasal congestion or excess/ purulent secretions, ear ache,   fever, chills, sweats, unintended wt loss, pleuritic or exertional cp, hemoptysis,  orthopnea pnd or leg swelling, presyncope, palpitations, heartburn, abdominal pain, anorexia, nausea, vomiting, diarrhea  or change in bowel or urinary habits, change in stools or urine, dysuria,hematuria,  rash, arthralgias, visual complaints, headache, numbness weakness or ataxia or problems with walking or coordination,  change in mood/affect or memory.                      Objective:   Physical Exam  11/16/2013  132  Wt Readings from Last 3 Encounters:  09/22/13 130 lb 6.4 oz (59.149 kg)  09/01/13 131 lb 12.8 oz (59.784 kg)  08/24/13 135 lb 3.2 oz (61.326 kg)       HEENT: nl dentition, turbinates, and orophanx. Nl external ear canals without cough reflex   NECK :  without JVD/Nodes/TM/ nl carotid upstrokes bilaterally   LUNGS: no acc muscle use, clear to A and P bilaterally without cough on insp or exp maneuvers   CV:  RRR  no s3 or murmur or increase in P2, no edema   ABD:  soft and nontender with nl excursion in the supine position. No bruits or organomegaly, bowel sounds nl  MS:  warm without deformities, calf tenderness, cyanosis or clubbing  SKIN: warm and dry without lesions    NEURO:  alert, approp, no deficits      cxr 06/27/13 No acute cardiopulmonary disease identified.     Assessment & Plan:

## 2013-11-16 NOTE — Patient Instructions (Addendum)
Continue dexilant until 100% better then reduce generic protonix 40 mg per day  If can't tolerate the dexilant or protonix for any reason let us refer you to GI  GERD (REFLUX)  is an extremely common cause of respiratory symptoms, many times with no significant heartburn at all.    It can be treated with medication, but also with lifestyle changes including avoidance of late meals, excessive alcohol, smoking cessation, and avoid fatty foods, chocolate, peppermint, colas, red wine, and acidic juices such as orange juice.  NO MINT OR MENTHOL PRODUCTS SO NO COUGH DROPS  USE SUGARLESS CANDY INSTEAD (jolley ranchers or Stover's)  NO OIL BASED VITAMINS - use powdered substitutes.    If dexilant not effective call for further workup - if better let Dr Asa Lente know

## 2013-11-17 NOTE — Assessment & Plan Note (Addendum)
Arlyce Harman and cxr 11/2010 normal Ct sinuses 05/2011:  Acute and chronic sinusitis. Full PFT's 11/2011:  Normal  CT sinuses 11/2012:  Thickening of sinuses without significant fluid.  Followed by ENT Wilburn Cornelia) - Rec repeat CT 09/02/13 by Dr Joya Gaskins > declined  - MCT rec 09/22/2013 > not done   I had an extended discussion with the patient today lasting 15 to 20 minutes of a 25 minute visit on the following issues:  She is convinced if she just takes dexilant consistently her problems are solved but can't afford the med - one option is to just use dexilant until 100% better then immediately change to generic protonix vs go to GI for consideration of NF > she wants to try the former first, declines other pulmonary w/u which is fine with me as long as doing well.     Each maintenance medication was reviewed in detail including most importantly the difference between maintenance and as needed and under what circumstances the prns are to be used.  Please see instructions for details which were reviewed in writing and the patient given a copy.

## 2013-11-25 ENCOUNTER — Telehealth: Payer: Self-pay | Admitting: *Deleted

## 2013-11-25 MED ORDER — HYDROCODONE-HOMATROPINE 5-1.5 MG/5ML PO SYRP: ORAL_SOLUTION | ORAL | Status: DC

## 2013-11-25 NOTE — Telephone Encounter (Signed)
Ok - note need for screening with [employer] prior to picking up refill (if not already done)  

## 2013-11-25 NOTE — Telephone Encounter (Signed)
Patient phoned requesting cough syrup refill.  Last OV with PCP 11/12/13.  Please advise.  CB# [phone]

## 2013-11-26 DIAGNOSIS — Z79899 Other long term (current) drug therapy: Secondary | ICD-10-CM | POA: Diagnosis not present

## 2013-11-26 NOTE — Telephone Encounter (Signed)
Printed controlled substance contract & notified patient.  Script & contract in refill bin.

## 2013-11-27 ENCOUNTER — Ambulatory Visit (INDEPENDENT_AMBULATORY_CARE_PROVIDER_SITE_OTHER): Payer: Medicare Other | Admitting: Internal Medicine

## 2013-11-27 ENCOUNTER — Encounter: Payer: Self-pay | Admitting: Internal Medicine

## 2013-11-27 VITALS — BP 142/76 | HR 90 | Temp 98.1°F | Ht 63.0 in | Wt 135.8 lb

## 2013-11-27 DIAGNOSIS — R05 Cough: Secondary | ICD-10-CM | POA: Diagnosis not present

## 2013-11-27 DIAGNOSIS — R059 Cough, unspecified: Secondary | ICD-10-CM

## 2013-11-27 DIAGNOSIS — R058 Other specified cough: Secondary | ICD-10-CM

## 2013-11-27 MED ORDER — BUDESONIDE-FORMOTEROL FUMARATE 80-4.5 MCG/ACT IN AERO: INHALATION_SPRAY | RESPIRATORY_TRACT | Status: DC

## 2013-11-27 MED ORDER — PREDNISONE 10 MG PO TABS: ORAL_TABLET | ORAL | Status: DC

## 2013-11-27 MED ORDER — TRAMADOL HCL 50 MG PO TABS: ORAL_TABLET | ORAL | Status: DC

## 2013-11-27 MED ORDER — AMOXICILLIN-POT CLAVULANATE 875-125 MG PO TABS: 1.0000 | ORAL_TABLET | Freq: Two times a day (BID) | ORAL | Status: DC

## 2013-11-27 NOTE — Progress Notes (Signed)
Subjective:    Patient ID: KEAUNNA SKIPPER, female    DOB: 03-Apr-1941    MRN: [id number]  Brief patient profile:  22 yowf never smoker with recurrent cough x 2011 responsive to prednisone for weeks at a time s the need for cough meds (during the day, still tessalon at hs)  referred for consultation by Dr Basilio Cairo having seen Dr Gwenette Greet previously with dx of pnds/cyclical coughing but requesting a second opinion as cough never resolved.   History of Present Illness  08/24/2013 1st [facility] office visit/ Kamdon Reisig  Chief Complaint  Patient presents with  . Acute Visit    Pt states cough no better since last ov here in Sept. Cough is occ prod with foamy, white sputum.   no better with abx/ ent intervention/ always better with prednisone.  Presently finishing up round of abx/prednisone and "it's going away like it always does on prednisone" Tends to be worse when lies down with persistent sense of pnds which can disturb sleep as well but amount of mucus does not peak in ams and never purulent. No better/ worse on inhalers Assoc with urinary incont when severe rec Dexilant 60  Mg Take 30-60 min before first meal of the day  And at bedtime Pepcid 20 mg and chlortrimeton 4 mg until return Stop zyrtec  GERD (REFLUX)   For cough you can take the cough syrup or tramadol as needed  For flare return here with all medications in hand including all over the counters all inhalers    09/01/2013 Chief Complaint  Patient presents with  . Acute Visit    Cough with white and yellow mucus  Pt saw MW 11/17 and rec Arlyce Harman and cxr 11/2010 normal  Ct sinuses 05/2011: Acute and chronic sinusitis.  Full PFT's 11/2011: Normal  Dexilant 60 Mg Take 30-60 min before first meal of the day And at bedtime Pepcid 20 mg and chlortrimeton 4 mg until return  Stop zyrtec  Cough worse, now thick yellow. No real heartburn. No dyspnea.  Produces mucus all the time.  rec Strict reflux diet Stay on Dexilant one daily and  pepcid at night Prednisone 10mg  Take 4 for four days 3 for four days 2 for four days 1 for four days Augmentin twice daily for 10days Flonase two puff ea nostril Twice daily Stay on chlorpheniramine Follow cough protocol Stay off work ONE week, voice rest CT Sinus > declined   09/23/2013 f/u ov/Corrinne Benegas re:  cough x 3 year Chief Complaint  Patient presents with  . Follow-up    Cough is much improved since her last visit. No new co's today.She is taking tessalon mainly just at night.  rec Continue flonase one twice daily  Please see patient coordinator before you leave today  to schedule a methacholine challenge test  Now while you are better > not done  Call if you start to flare for Sinus Ct  [phone] ask for [facility] and schedule a visit with all your medications in hand in two bags - automatics vs backups Continue the chlortrimeton 4 mg at bedtime along with pepcid 20 mg at bedtime    11/16/2013 f/u ov/Criss Pallone re: recurrent cough  When stopped dexilant / did not bring meds  Chief Complaint  Patient presents with  . Cough    Cough x2 weeks -- Was off Dexilant x2 weeks due to insurance and has been back on it now for 15 days--States though cough returned due to stopping medication for 2  weeks  was fine as long as on dexilant, fine back on it but not sure she can afford taking it. When not on it cough and wheezing requing freq saba, now not needing at all  rec Continue dexilant until 100% better then reduce generic protonix 40 mg pe day If can't tolerate the dexilant or protonix for any reason let us refer you to GI GERD diet reviewed    . 11/27/2013 f/u ov/Jewelz Ricklefs re:  Cough  X 3y  dexilant ac daily and hs pepcid Chief Complaint  Patient presents with  . Follow-up    Pt states her cough has been worse for the past wk. She states she is coughing "nonstop".  Cough is prod with large amounts of white to yellow sputum.  Sometimes coughs until vomits.   worse when head hits pillow  Never  followed through on prev recs and frustrated "never really better"   Sleeping ok without nocturnal  or early am exacerbation  of respiratory  c/o's or need for noct saba. Also denies any obvious fluctuation of symptoms with weather or environmental changes or other aggravating or alleviating factors except as outlined above   Current Medications, Allergies, Complete Past Medical History, Past Surgical History, Family History, and Social History were reviewed in [employer] record.  ROS  The following are not active complaints unless bolded sore throat, dysphagia, dental problems, itching, sneezing,  nasal congestion or excess/ purulent secretions, ear ache,   fever, chills, sweats, unintended wt loss, pleuritic or exertional cp, hemoptysis,  orthopnea pnd or leg swelling, presyncope, palpitations, heartburn, abdominal pain, anorexia, nausea, vomiting, diarrhea  or change in bowel or urinary habits, change in stools or urine, dysuria,hematuria,  rash, arthralgias, visual complaints, headache, numbness weakness or ataxia or problems with walking or coordination,  change in mood/affect or memory.                      Objective:   Physical Exam  11/16/2013  132 > 11/27/2013   136  Wt Readings from Last 3 Encounters:  09/22/13 130 lb 6.4 oz (59.149 kg)  09/01/13 131 lb 12.8 oz (59.784 kg)  08/24/13 135 lb 3.2 oz (61.326 kg)       HEENT: nl dentition, turbinates, and orophanx. Nl external ear canals without cough reflex   NECK :  without JVD/Nodes/TM/ nl carotid upstrokes bilaterally   LUNGS: no acc muscle use, clear to A and P bilaterally without cough on insp or exp maneuvers   CV:  RRR  no s3 or murmur or increase in P2, no edema   ABD:  soft and nontender with nl excursion in the supine position. No bruits or organomegaly, bowel sounds nl  MS:  warm without deformities, calf tenderness, cyanosis or clubbing  SKIN: warm and dry without lesions     NEURO:  alert, approp, no deficits      cxr 06/27/13 No acute cardiopulmonary disease identified.     Assessment & Plan:

## 2013-11-27 NOTE — Patient Instructions (Addendum)
Augmentin 875 mg take one pill twice daily  X 10 days - take at breakfast and supper with large glass of water.  It would help reduce the usual side effects (diarrhea and yeast infections) if you ate cultured yogurt at lunch.   Prednisone 10 mg take  4 each am x 2 days,   2 each am x 2 days,  1 each am x 2 days and stop   symbicort 80 Take 2 puffs first thing in am and then another 2 puffs about 12 hours later.    Work on inhaler technique:  relax and gently blow all the way out then take a nice smooth deep breath back in, triggering the inhaler at same time you start breathing in.  Hold for up to 5 seconds if you can.  Rinse and gargle with water when done  Take delsym two tsp every 12 hours and supplement if needed with  tramadol 50 mg up to 2 every 4 hours to suppress the urge to cough. Swallowing water or using ice chips/non mint and menthol containing candies (such as lifesavers or sugarless jolly ranchers) are also effective.  You should rest your voice and avoid activities that you know make you cough.  Once you have eliminated the cough for 3 straight days try reducing the tramadol first,  then the delsym as tolerated.    See Tammy NP w/in 2 weeks with all your medications, even over the counter meds, separated in two separate bags, the ones you take no matter what vs the ones you stop once you feel better and take only as needed when you feel you need them.   Tammy  will generate for you a new user friendly medication calendar that will put [place] all on the same page re: your medication use.     Without this process, it simply isn't possible to assure that we are providing  your outpatient care  with  the attention to detail we feel you deserve.   If we cannot assure that you're getting that kind of care,  then we cannot manage your problem effectively from this clinic.  Once you have seen Tammy and we are sure that we're all on the same page with your medication use she will arrange follow up  with me.    If not better needs sinus ct, cbc with diff allergy profile and methacholine challenge test completed before ov

## 2013-11-28 NOTE — Assessment & Plan Note (Addendum)
Dorothy Lee and cxr 11/2010 normal Ct sinuses 05/2011:  Acute and chronic sinusitis. Full PFT's 11/2011:  Normal  CT sinuses 11/2012:  Thickening of sinuses without significant fluid.  Followed by ENT Wilburn Cornelia) - Rec repeat CT 09/02/13 by Dr Joya Gaskins > declined  - MCT rec 09/22/2013 > not done   She previously stated her cough resolved on dexilant, now says it never really went away and has clearly flared even back on dexilant daily.  I had an extended discussion with the patient today lasting 15 to 20 minutes of a 25 minute visit on the following issues: The standardized cough guidelines published in Chest by Lissa Morales in 2006 are still the best available and consist of a multiple step process (up to 12!) , not a single office visit,  and are intended  to address this problem logically,  with an alogrithm dependent on response to empiric treatment at  each progressive step  to determine a specific diagnosis with  minimal addtional testing needed. Therefore if adherence is an issue or can't be accurately verified,  it's very unlikely the standard evaluation and treatment will be successful here.    Furthermore, response to therapy (other than acute cough suppression, which should only be used short term with avoidance of narcotic containing cough syrups if possible), can be a gradual process for which the patient may perceive immediate benefit.  Unlike going to an eye doctor where the best perscription is almost always the first one and is immediately effective, this is almost never the case in the management of chronic cough syndromes. Therefore the patient needs to commit up front to consistently adhere to recommendations  for up to 6 weeks of therapy directed at the likely underlying problem(s) before the response can be reasonably evaluated.   If she wants our help she needs to first go through a full, accurate med reconciliation and then let us a systematic w/u and follow instructions at each branch  along the algorithmic pathway to sort through the ddx.

## 2013-11-28 NOTE — Telephone Encounter (Signed)
Error

## 2013-12-10 ENCOUNTER — Encounter: Payer: Self-pay | Admitting: Adult Health

## 2013-12-14 ENCOUNTER — Encounter: Payer: Medicare Other | Admitting: Adult Health

## 2013-12-15 ENCOUNTER — Encounter: Payer: Self-pay | Admitting: Adult Health

## 2013-12-15 ENCOUNTER — Ambulatory Visit (INDEPENDENT_AMBULATORY_CARE_PROVIDER_SITE_OTHER): Payer: Medicare Other | Admitting: Adult Health

## 2013-12-15 VITALS — BP 136/84 | HR 81 | Temp 98.6°F | Ht 63.0 in | Wt 135.8 lb

## 2013-12-15 DIAGNOSIS — R059 Cough, unspecified: Secondary | ICD-10-CM | POA: Diagnosis not present

## 2013-12-15 DIAGNOSIS — R058 Other specified cough: Secondary | ICD-10-CM

## 2013-12-15 DIAGNOSIS — R05 Cough: Secondary | ICD-10-CM | POA: Diagnosis not present

## 2013-12-15 MED ORDER — BUDESONIDE-FORMOTEROL FUMARATE 80-4.5 MCG/ACT IN AERO: INHALATION_SPRAY | RESPIRATORY_TRACT | Status: DC

## 2013-12-15 NOTE — Addendum Note (Signed)
Addended by: Parke Poisson E on: 12/15/2013 12:24 PM   Modules accepted: Orders

## 2013-12-15 NOTE — Progress Notes (Signed)
Subjective:    Patient ID: Dorothy Lee, female    DOB: Sep 06, 1941    MRN: [id number]  Brief patient profile:  68 yowf never smoker with recurrent cough x 2011 responsive to prednisone for weeks at a time s the need for cough meds (during the day, still tessalon at hs)  referred for consultation by Dr Basilio Cairo having seen Dr Gwenette Greet previously with dx of pnds/cyclical coughing but requesting a second opinion as cough never resolved.   History of Present Illness  08/24/2013 1st [facility] office visit/ Wert  Chief Complaint  Patient presents with  . Acute Visit    Pt states cough no better since last ov here in Sept. Cough is occ prod with foamy, white sputum.   no better with abx/ ent intervention/ always better with prednisone.  Presently finishing up round of abx/prednisone and "it's going away like it always does on prednisone" Tends to be worse when lies down with persistent sense of pnds which can disturb sleep as well but amount of mucus does not peak in ams and never purulent. No better/ worse on inhalers Assoc with urinary incont when severe rec Dexilant 60  Mg Take 30-60 min before first meal of the day  And at bedtime Pepcid 20 mg and chlortrimeton 4 mg until return Stop zyrtec  GERD (REFLUX)   For cough you can take the cough syrup or tramadol as needed  For flare return here with all medications in hand including all over the counters all inhalers    09/01/2013 Chief Complaint  Patient presents with  . Acute Visit    Cough with white and yellow mucus  Pt saw MW 11/17 and rec Arlyce Harman and cxr 11/2010 normal  Ct sinuses 05/2011: Acute and chronic sinusitis.  Full PFT's 11/2011: Normal  Dexilant 60 Mg Take 30-60 min before first meal of the day And at bedtime Pepcid 20 mg and chlortrimeton 4 mg until return  Stop zyrtec  Cough worse, now thick yellow. No real heartburn. No dyspnea.  Produces mucus all the time.  rec Strict reflux diet Stay on Dexilant one daily and  pepcid at night Prednisone 10mg  Take 4 for four days 3 for four days 2 for four days 1 for four days Augmentin twice daily for 10days Flonase two puff ea nostril Twice daily Stay on chlorpheniramine Follow cough protocol Stay off work ONE week, voice rest CT Sinus > declined   09/23/2013 f/u ov/Wert re:  cough x 3 year Chief Complaint  Patient presents with  . Follow-up    Cough is much improved since her last visit. No new co's today.She is taking tessalon mainly just at night.  rec Continue flonase one twice daily  Please see patient coordinator before you leave today  to schedule a methacholine challenge test  Now while you are better > not done  Call if you start to flare for Sinus Ct  [phone] ask for [facility] and schedule a visit with all your medications in hand in two bags - automatics vs backups Continue the chlortrimeton 4 mg at bedtime along with pepcid 20 mg at bedtime    11/16/2013 f/u ov/Wert re: recurrent cough  When stopped dexilant / did not bring meds  Chief Complaint  Patient presents with  . Cough    Cough x2 weeks -- Was off Dexilant x2 weeks due to insurance and has been back on it now for 15 days--States though cough returned due to stopping medication for 2  weeks  was fine as long as on dexilant, fine back on it but not sure she can afford taking it. When not on it cough and wheezing requing freq saba, now not needing at all  rec Continue dexilant until 100% better then reduce generic protonix 40 mg pe day If can't tolerate the dexilant or protonix for any reason let us refer you to GI GERD diet reviewed    . 11/27/2013 f/u ov/Wert re:  Cough  X 3y  dexilant ac daily and hs pepcid Chief Complaint  Patient presents with  . Follow-up    Pt states her cough has been worse for the past wk. She states she is coughing "nonstop".  Cough is prod with large amounts of white to yellow sputum.  Sometimes coughs until vomits.   worse when head hits pillow  Never  followed through on prev recs and frustrated "never really better" >>Augmentin 875 mg take one pill twice daily  X 10 days - Prednisone taper  rx symbicort 80   12/15/2013 Follow up and Med Review  Returns for follow up and med review .  Last ov with flare of cough with sinus congestion  Tx w/ Augmentin 875 mg take one pill twice daily  X 10 days - Prednisone taper and started on  symbicort 80   pt reports she is feeling better today since last ov.  no new complaints. Cough is resolved.  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  Has on/off nasal stuffiness . This seems to be where cough starts from.  Patient denies any hemoptysis, orthopnea, PND, or leg swelling.   Current Medications, Allergies, Complete Past Medical History, Past Surgical History, Family History, and Social History were reviewed in [employer] record.  ROS  The following are not active complaints unless bolded sore throat, dysphagia, dental problems, itching, sneezing,  nasal congestion or excess/ purulent secretions, ear ache,   fever, chills, sweats, unintended wt loss, pleuritic or exertional cp, hemoptysis,  orthopnea pnd or leg swelling, presyncope, palpitations, heartburn, abdominal pain, anorexia, nausea, vomiting, diarrhea  or change in bowel or urinary habits, change in stools or urine, dysuria,hematuria,  rash, arthralgias, visual complaints, headache, numbness weakness or ataxia or problems with walking or coordination,  change in mood/affect or memory.                      Objective:   Physical Exam  11/16/2013  132 > 11/27/2013   136  >135 12/15/2013       HEENT: nl dentition, turbinates, and orophanx. Nl external ear canals without cough reflex   NECK :  without JVD/Nodes/TM/ nl carotid upstrokes bilaterally   LUNGS: no acc muscle use, clear to A and P bilaterally without cough on insp or exp maneuvers   CV:  RRR  no s3 or murmur or increase in P2, no  edema   ABD:  soft and nontender with nl excursion in the supine position. No bruits or organomegaly, bowel sounds nl  MS:  warm without deformities, calf tenderness, cyanosis or clubbing  SKIN: warm and dry without lesions    NEURO:  alert, approp, no deficits      cxr 06/27/13 No acute cardiopulmonary disease identified.     Assessment & Plan:

## 2013-12-15 NOTE — Patient Instructions (Addendum)
Follow med calendar closley and bring to each visit.  Add Chlortrimeton 4mg  2 At bedtime   Add Zyrtec 10mg  At bedtime   Continue on Flonase 1 puff Twice daily   May use Mucinex DM Twice daily  As needed cough/congestion  follow up Dr. Melvyn Novas  In 2-3 months and As needed   Please contact office for sooner follow up if symptoms do not improve or worsen  Labs today

## 2013-12-15 NOTE — Assessment & Plan Note (Signed)
Improved with recent sinusitis now resolved abx /steroids  Need to control for triggers as recurrent symptoms requiring frequent abx/steroids  Add AR prevention.   Plan  Follow med calendar closley and bring to each visit.  Add Chlortrimeton 4mg  2 At bedtime   Add Zyrtec 10mg  At bedtime   Continue on Flonase 1 puff Twice daily   May use Mucinex DM Twice daily  As needed cough/congestion  follow up Dr. Melvyn Novas  In 2-3 months and As needed   Please contact office for sooner follow up if symptoms do not improve or worsen

## 2013-12-21 ENCOUNTER — Encounter: Payer: Self-pay | Admitting: Internal Medicine

## 2013-12-21 NOTE — Addendum Note (Signed)
Addended by: Parke Poisson E on: 12/21/2013 09:22 AM   Modules accepted: Orders, Medications

## 2013-12-31 ENCOUNTER — Other Ambulatory Visit: Payer: Self-pay | Admitting: Internal Medicine

## 2014-01-01 NOTE — Telephone Encounter (Signed)
Rx signed and faxed to [address]

## 2014-01-19 ENCOUNTER — Encounter: Payer: Self-pay | Admitting: Internal Medicine

## 2014-01-19 ENCOUNTER — Other Ambulatory Visit (INDEPENDENT_AMBULATORY_CARE_PROVIDER_SITE_OTHER): Payer: Medicare Other

## 2014-01-19 ENCOUNTER — Ambulatory Visit (INDEPENDENT_AMBULATORY_CARE_PROVIDER_SITE_OTHER): Payer: Medicare Other | Admitting: Internal Medicine

## 2014-01-19 VITALS — BP 144/80 | HR 77 | Temp 98.0°F | Ht 63.0 in | Wt 131.0 lb

## 2014-01-19 DIAGNOSIS — R05 Cough: Secondary | ICD-10-CM | POA: Diagnosis not present

## 2014-01-19 DIAGNOSIS — R059 Cough, unspecified: Secondary | ICD-10-CM

## 2014-01-19 DIAGNOSIS — R058 Other specified cough: Secondary | ICD-10-CM

## 2014-01-19 DIAGNOSIS — J45991 Cough variant asthma: Secondary | ICD-10-CM | POA: Insufficient documentation

## 2014-01-19 LAB — CBC WITH DIFFERENTIAL/PLATELET
Basophils Absolute: 0.1 10*3/uL (ref 0.0–0.1)
Basophils Relative: 1 % (ref 0.0–3.0)
EOS PCT: 12.4 % — AB (ref 0.0–5.0)
Eosinophils Absolute: 1 10*3/uL — ABNORMAL HIGH (ref 0.0–0.7)
HCT: 47.5 % — ABNORMAL HIGH (ref 36.0–46.0)
Hemoglobin: 15.8 g/dL — ABNORMAL HIGH (ref 12.0–15.0)
LYMPHS PCT: 26.5 % (ref 12.0–46.0)
Lymphs Abs: 2.2 10*3/uL (ref 0.7–4.0)
MCHC: 33.3 g/dL (ref 30.0–36.0)
MCV: 89.7 fl (ref 78.0–100.0)
MONOS PCT: 8.8 % (ref 3.0–12.0)
Monocytes Absolute: 0.7 10*3/uL (ref 0.1–1.0)
NEUTROS PCT: 51.3 % (ref 43.0–77.0)
Neutro Abs: 4.2 10*3/uL (ref 1.4–7.7)
PLATELETS: 302 10*3/uL (ref 150.0–400.0)
RBC: 5.3 Mil/uL — ABNORMAL HIGH (ref 3.87–5.11)
RDW: 13.1 % (ref 11.5–14.6)
WBC: 8.2 10*3/uL (ref 4.5–10.5)

## 2014-01-19 MED ORDER — TRAMADOL HCL 50 MG PO TABS: 50.0000 mg | ORAL_TABLET | Freq: Four times a day (QID) | ORAL | Status: DC | PRN

## 2014-01-19 MED ORDER — PREDNISONE 10 MG PO TABS: ORAL_TABLET | ORAL | Status: DC

## 2014-01-19 NOTE — Progress Notes (Signed)
Prednisone 10 mg take  4 each am x 2 days,   2 each am x 2 days,  1 each am x 2 days and stop   Remember symbicort 80 is Take 2 puffs first thing in am and then another 2 puffs about 12 hours later.   See calendar for specific medication instructions and bring it back for each and every office visit for every healthcare provider you see.  Without it,  you may not receive the best quality medical care that we feel you deserve.  You will note that the calendar groups together  your maintenance  medications that are timed at particular times of the day.  Think of this as your checklist for what your doctor has instructed you to do until your next evaluation to see what benefit  there is  to staying on a consistent group of medications intended to keep you well.  The other group at the bottom is entirely up to you to use as you see fit  for specific symptoms that may arise between visits that require you to treat them on an as needed basis.  Think of this as your action plan or "what if" list.   Separating the top medications from the bottom group is fundamental to providing you adequate care going forward.

## 2014-01-19 NOTE — Patient Instructions (Addendum)
Prednisone 10 mg take  4 each am x 2 days,   2 each am x 2 days,  1 each am x 2 days and stop    symbicort 80 Take 2 puffs first thing in am and then another 2 puffs about 12 hours later.    Please remember to go to the lab   department downstairs for your tests - we will call you with the results when they are available.  GERD (REFLUX)  is an extremely common cause of respiratory symptoms, many times with no significant heartburn at all.    It can be treated with medication, but also with lifestyle changes including avoidance of late meals, excessive alcohol, smoking cessation, and avoid fatty foods, chocolate, peppermint, colas, red wine, and acidic juices such as orange juice.  NO MINT OR MENTHOL PRODUCTS SO NO COUGH DROPS  USE SUGARLESS CANDY INSTEAD (jolley ranchers or Stover's or life savers)   NO OIL BASED VITAMINS - use powdered substitutes.    Please schedule a follow up office visit in 6 weeks, call sooner if needed

## 2014-01-20 LAB — ALLERGY FULL PROFILE
ALLERGEN, D PTERNOYSSINUS, D1: 0.15 kU/L — AB
Alternaria Alternata: <0.1 kU/L
Bahia Grass: 0.14 kU/L — ABNORMAL HIGH
Bermuda Grass: 0.15 kU/L — ABNORMAL HIGH
Box Elder IgE: 0.13 kU/L — ABNORMAL HIGH
COMMON RAGWEED: 0.16 kU/L — AB
Candida Albicans: <0.1 kU/L
Curvularia lunata: <0.1 kU/L
D. farinae: <0.1 kU/L
Elm IgE: 0.14 kU/L — ABNORMAL HIGH
Fescue: 0.12 kU/L — ABNORMAL HIGH
G005 Rye, Perennial: 0.11 kU/L — ABNORMAL HIGH
G009 Red Top: 0.11 kU/L — ABNORMAL HIGH
GOOSE FEATHERS: 0.16 kU/L — AB
HOUSE DUST HOLLISTER: 0.36 kU/L — AB
IgE (Immunoglobulin E), Serum: 248.7 IU/mL — ABNORMAL HIGH (ref 0.0–180.0)
Lamb's Quarters: 0.17 kU/L — ABNORMAL HIGH
OAK CLASS: 0.13 kU/L — AB
Plantain: 0.14 kU/L — ABNORMAL HIGH
SYCAMORE TREE: 0.14 kU/L — AB
Timothy Grass: 0.14 kU/L — ABNORMAL HIGH

## 2014-01-21 ENCOUNTER — Encounter: Payer: Self-pay | Admitting: Internal Medicine

## 2014-01-21 NOTE — Progress Notes (Signed)
Quick Note:  Spoke with pt and notified of results per Dr. Wert. Pt verbalized understanding and denied any questions.  ______ 

## 2014-01-23 NOTE — Assessment & Plan Note (Addendum)
Dorothy Lee and cxr 11/2010 normal Ct sinuses 05/2011:  Acute and chronic sinusitis. Full PFT's 11/2011:  Normal  CT sinuses 11/2012:  Thickening of sinuses without significant fluid.  Followed by ENT Wilburn Cornelia) - Rec repeat CT 09/02/13 by Dr Joya Gaskins > declined  - MCT rec 09/22/2013 > not done  -Med Calendar 12/15/2013  - Allergy profile 01/19/14 >  Eos 12.8% and IgE 249 with Pos Rast for trees, grasses ragweeds  ddx is uacs in pt with apparent atopic rhinitis  vs cough variant asthma with steroid responsiveness > rec rechallenge with symbicort 80 2bid p prednisone     Each maintenance medication was reviewed in detail including most importantly the difference between maintenance and as needed and under what circumstances the prns are to be used. This was done in the context of a medication calendar review which provided the patient with a user-friendly unambiguous mechanism for medication administration and reconciliation and provides an action plan for all active problems. It is critical that this be shown to every doctor  for modification during the office visit if necessary so the patient can use it as a working document.

## 2014-01-23 NOTE — Progress Notes (Signed)
Subjective:    Patient ID: Dorothy Lee, female    DOB: 03-Apr-1941    MRN: [id number]  Brief patient profile:  22 yowf never smoker with recurrent cough x 2011 responsive to prednisone for weeks at a time s the need for cough meds (during the day, still tessalon at hs)  referred for consultation by Dr Basilio Cairo having seen Dr Gwenette Greet previously with dx of pnds/cyclical coughing but requesting a second opinion as cough never resolved.   History of Present Illness  08/24/2013 1st [facility] office visit/ Dorothy Lee  Chief Complaint  Patient presents with  . Acute Visit    Pt states cough no better since last ov here in Sept. Cough is occ prod with foamy, white sputum.   no better with abx/ ent intervention/ always better with prednisone.  Presently finishing up round of abx/prednisone and "it's going away like it always does on prednisone" Tends to be worse when lies down with persistent sense of pnds which can disturb sleep as well but amount of mucus does not peak in ams and never purulent. No better/ worse on inhalers Assoc with urinary incont when severe rec Dexilant 60  Mg Take 30-60 min before first meal of the day  And at bedtime Pepcid 20 mg and chlortrimeton 4 mg until return Stop zyrtec  GERD (REFLUX)   For cough you can take the cough syrup or tramadol as needed  For flare return here with all medications in hand including all over the counters all inhalers    09/01/2013 Chief Complaint  Patient presents with  . Acute Visit    Cough with white and yellow mucus  Pt saw MW 11/17 and rec Arlyce Harman and cxr 11/2010 normal  Ct sinuses 05/2011: Acute and chronic sinusitis.  Full PFT's 11/2011: Normal  Dexilant 60 Mg Take 30-60 min before first meal of the day And at bedtime Pepcid 20 mg and chlortrimeton 4 mg until return  Stop zyrtec  Cough worse, now thick yellow. No real heartburn. No dyspnea.  Produces mucus all the time.  rec Strict reflux diet Stay on Dexilant one daily and  pepcid at night Prednisone 10mg  Take 4 for four days 3 for four days 2 for four days 1 for four days Augmentin twice daily for 10days Flonase two puff ea nostril Twice daily Stay on chlorpheniramine Follow cough protocol Stay off work ONE week, voice rest CT Sinus > declined   09/23/2013 f/u ov/Dorothy Lee re:  cough x 3 year Chief Complaint  Patient presents with  . Follow-up    Cough is much improved since her last visit. No new co's today.She is taking tessalon mainly just at night.  rec Continue flonase one twice daily  Please see patient coordinator before you leave today  to schedule a methacholine challenge test  Now while you are better > not done  Call if you start to flare for Sinus Ct  [phone] ask for [facility] and schedule a visit with all your medications in hand in two bags - automatics vs backups Continue the chlortrimeton 4 mg at bedtime along with pepcid 20 mg at bedtime    11/16/2013 f/u ov/Dorothy Lee re: recurrent cough  When stopped dexilant / did not bring meds  Chief Complaint  Patient presents with  . Cough    Cough x2 weeks -- Was off Dexilant x2 weeks due to insurance and has been back on it now for 15 days--States though cough returned due to stopping medication for 2  weeks  was fine as long as on dexilant, fine back on it but not sure she can afford taking it. When not on it cough and wheezing requing freq saba, now not needing at all  rec Continue dexilant until 100% better then reduce generic protonix 40 mg pe day If can't tolerate the dexilant or protonix for any reason let us refer you to GI GERD diet reviewed    . 11/27/2013 f/u ov/Dorothy Lee re:  Cough  X 3y  dexilant ac daily and hs pepcid Chief Complaint  Patient presents with  . Follow-up    Pt states her cough has been worse for the past wk. She states she is coughing "nonstop".  Cough is prod with large amounts of white to yellow sputum.  Sometimes coughs until vomits.   worse when head hits pillow  Never  followed through on prev recs and frustrated "never really better" >>Augmentin 875 mg take one pill twice daily  X 10 days - Prednisone taper  rx symbicort 80   12/15/2013 Follow up and Med Review  Returns for follow up and med review .  Last ov with flare of cough with sinus congestion  Tx w/ Augmentin 875 mg take one pill twice daily  X 10 days - Prednisone taper and started on  symbicort 80   pt reports she is feeling better today since last ov.  no new complaints. Cough is resolved.  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  Has on/off nasal stuffiness . This seems to be related to the cough rec Add zyrtec/chlortrimeton/flonase  01/19/2014 f/u ov/Dorothy Lee re: cough  Chief Complaint  Patient presents with  . Acute Visit    Pt states cough returned 2 wks ago. Cough is prod with thick, white sputum. She states sometimes she has a hard time catching her breath and relates to s/e of tramadol.   cough was completely gone on rx before relapse/did not maintain on symbicort consistently    No obvious day to day or daytime variabilty or sob  or cp or chest tightness, subjective wheeze overt sinus or hb symptoms. No unusual exp hx or h/o childhood pna/ asthma or knowledge of premature birth.  Sleeping ok without nocturnal  or early am exacerbation  of respiratory  c/o's or need for noct saba. Also denies any obvious fluctuation of symptoms with weather or environmental changes or other aggravating or alleviating factors except as outlined above   Current Medications, Allergies, Complete Past Medical History, Past Surgical History, Family History, and Social History were reviewed in [employer] record.  ROS  The following are not active complaints unless bolded sore throat, dysphagia, dental problems, itching, sneezing,  nasal congestion or excess/ purulent secretions, ear ache,   fever, chills, sweats, unintended wt loss, pleuritic or exertional cp,  hemoptysis,  orthopnea pnd or leg swelling, presyncope, palpitations, heartburn, abdominal pain, anorexia, nausea, vomiting, diarrhea  or change in bowel or urinary habits, change in stools or urine, dysuria,hematuria,  rash, arthralgias, visual complaints, headache, numbness weakness or ataxia or problems with walking or coordination,  change in mood/affect or memory.               Objective:   Physical Exam  Wt Readings from Last 3 Encounters:  01/19/14 131 lb (59.421 kg)  12/15/13 135 lb 12.8 oz (61.598 kg)  11/27/13 135 lb 12.8 oz (61.598 kg)          HEENT: nl dentition, turbinates,  and orophanx. Nl external ear canals without cough reflex   NECK :  without JVD/Nodes/TM/ nl carotid upstrokes bilaterally   LUNGS: no acc muscle use, clear to A and P bilaterally without cough on insp or exp maneuvers   CV:  RRR  no s3 or murmur or increase in P2, no edema   ABD:  soft and nontender with nl excursion in the supine position. No bruits or organomegaly, bowel sounds nl  MS:  warm without deformities, calf tenderness, cyanosis or clubbing  SKIN: warm and dry without lesions    NEURO:  alert, approp, no deficits      cxr 06/27/13 No acute cardiopulmonary disease identified.     Assessment & Plan:

## 2014-01-27 ENCOUNTER — Other Ambulatory Visit: Payer: Medicare Other

## 2014-01-29 ENCOUNTER — Ambulatory Visit: Payer: Medicare Other | Admitting: Pharmacist

## 2014-02-08 ENCOUNTER — Other Ambulatory Visit: Payer: Self-pay | Admitting: Internal Medicine

## 2014-03-03 ENCOUNTER — Encounter: Payer: Self-pay | Admitting: Internal Medicine

## 2014-03-03 ENCOUNTER — Ambulatory Visit (INDEPENDENT_AMBULATORY_CARE_PROVIDER_SITE_OTHER): Payer: Medicare Other | Admitting: Internal Medicine

## 2014-03-03 VITALS — BP 132/70 | HR 90 | Temp 98.1°F | Ht 63.0 in | Wt 137.0 lb

## 2014-03-03 DIAGNOSIS — J45991 Cough variant asthma: Secondary | ICD-10-CM

## 2014-03-03 DIAGNOSIS — J309 Allergic rhinitis, unspecified: Secondary | ICD-10-CM | POA: Diagnosis not present

## 2014-03-03 NOTE — Patient Instructions (Addendum)
Continue symbicort 80 Take 2 puffs first thing in am and then another 2 puffs about 12 hours later.   Work on inhaler technique:  relax and gently blow all the way out then take a nice smooth deep breath back in, triggering the inhaler at same time you start breathing in.  Hold for up to 5 seconds if you can.  Rinse and gargle with water when done  Stop all acid suppression and chlortimeton at bedtime to see what difference this makes  Please schedule a follow up office visit in 6 weeks, call sooner if needed with all medications in hand

## 2014-03-03 NOTE — Assessment & Plan Note (Signed)
Very convincing response to symbicort despite poor hfa  The proper method of use, as well as anticipated side effects, of a metered-dose inhaler are discussed and demonstrated to the patient. Improved effectiveness after extensive coaching during this visit to a level of approximately  75% from a baseline of 25% so ok to continue symbicort 80 2bid and try off acid suppression     Each maintenance medication was reviewed in detail including most importantly the difference between maintenance and as needed and under what circumstances the prns are to be used.  Please see instructions for details which were reviewed in writing and the patient given a copy.

## 2014-03-03 NOTE — Progress Notes (Signed)
Subjective:   Patient ID: Dorothy Lee, female    DOB: May 17, 1941    MRN: [id number]   Brief patient profile:  73 yowf never smoker with recurrent cough x 2011 responsive to prednisone for weeks at a time s the need for cough meds (during the day, still tessalon at hs)  referred for consultation by Dr Basilio Cairo having seen Dr Gwenette Greet previously with dx of pnds/cyclical coughing but requesting a second opinion as cough never resolved.   History of Present Illness  08/24/2013 1st [facility] office visit/ [name]  Chief Complaint  Patient presents with  . Acute Visit    Pt states cough no better since last ov here in Sept. Cough is occ prod with foamy, white sputum.   no better with abx/ ent intervention/ always better with prednisone.  Presently finishing up round of abx/prednisone and "it's going away like it always does on prednisone" Tends to be worse when lies down with persistent sense of pnds which can disturb sleep as well but amount of mucus does not peak in ams and never purulent. No better/ worse on inhalers Assoc with urinary incont when severe rec Dexilant 60  Mg Take 30-60 min before first meal of the day  And at bedtime Pepcid 20 mg and chlortrimeton 4 mg until return Stop zyrtec  GERD (REFLUX)   For cough you can take the cough syrup or tramadol as needed  For flare return here with all medications in hand including all over the counters all inhalers    09/01/2013 Chief Complaint  Patient presents with  . Acute Visit    Cough with white and yellow mucus  Pt saw MW 11/17 and rec Arlyce Harman and cxr 11/2010 normal  Ct sinuses 05/2011: Acute and chronic sinusitis.  Full PFT's 11/2011: Normal  Dexilant 60 Mg Take 30-60 min before first meal of the day And at bedtime Pepcid 20 mg and chlortrimeton 4 mg until return  Stop zyrtec  Cough worse, now thick yellow. No real heartburn. No dyspnea.  Produces mucus all the time.  rec Strict reflux diet Stay on Dexilant one daily and  pepcid at night Prednisone 10mg  Take 4 for four days 3 for four days 2 for four days 1 for four days Augmentin twice daily for 10days Flonase two puff ea nostril Twice daily Stay on chlorpheniramine Follow cough protocol Stay off work ONE week, voice rest CT Sinus > declined   09/23/2013 f/u ov/Vercie Pokorny re:  cough x 3 year Chief Complaint  Patient presents with  . Follow-up    Cough is much improved since her last visit. No new co's today.She is taking tessalon mainly just at night.  rec Continue flonase one twice daily  Please see patient coordinator before you leave today  to schedule a methacholine challenge test  Now while you are better > not done  Call if you start to flare for Sinus Ct  [phone] ask for [facility] and schedule a visit with all your medications in hand in two bags - automatics vs backups Continue the chlortrimeton 4 mg at bedtime along with pepcid 20 mg at bedtime    11/16/2013 f/u ov/[name] re: recurrent cough  When stopped dexilant / did not bring meds  Chief Complaint  Patient presents with  . Cough    Cough x2 weeks -- Was off Dexilant x2 weeks due to insurance and has been back on it now for 15 days--States though cough returned due to stopping medication for 2  weeks  was fine as long as on dexilant, fine back on it but not sure she can afford taking it. When not on it cough and wheezing requing freq saba, now not needing at all  rec Continue dexilant until 100% better then reduce generic protonix 40 mg pe day If can't tolerate the dexilant or protonix for any reason let us refer you to GI GERD diet reviewed    . 11/27/2013 f/u ov/[name] re:  Cough  X 3y  dexilant ac daily and hs pepcid Chief Complaint  Patient presents with  . Follow-up    Pt states her cough has been worse for the past wk. She states she is coughing "nonstop".  Cough is prod with large amounts of white to yellow sputum.  Sometimes coughs until vomits.   worse when head hits pillow  Never  followed through on prev recs and frustrated "never really better" >>Augmentin 875 mg take one pill twice daily  X 10 days - Prednisone taper  rx symbicort 80   12/15/2013 Follow up and Med Review  Returns for follow up and med review .  Last ov with flare of cough with sinus congestion  Tx w/ Augmentin 875 mg take one pill twice daily  X 10 days - Prednisone taper and started on  symbicort 80   pt reports she is feeling better today since last ov.  no new complaints. Cough is resolved.  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  Has on/off nasal stuffiness . This seems to be related to the cough rec Add zyrtec/chlortrimeton/flonase  01/19/2014 f/u ov/[name] re: cough  Chief Complaint  Patient presents with  . Acute Visit    Pt states cough returned 2 wks ago. Cough is prod with thick, white sputum. She states sometimes she has a hard time catching her breath and relates to s/e of tramadol.   cough was completely gone on rx before relapse/did not maintain on symbicort consistently  rec Prednisone 10 mg take  4 each am x 2 days,   2 each am x 2 days,  1 each am x 2 days and stop  symbicort 80 Take 2 puffs first thing in am and then another 2 puffs about 12 hours later   03/03/2014 f/u ov/[name] re: best in years, attributes to starting symbicort 80 2bid/ not using med calendar, easily confused with details of care.   Chief Complaint  Patient presents with  . Follow-up    Pt states that her cough has resolved. No new co's today.     Only symptom is constant runny nose, watery, not waking at night at all - not needing any cough suppression at all   No obvious day to day or daytime variabilty or sob  or cp or chest tightness, subjective wheeze overt  hb symptoms. No unusual exp hx or h/o childhood pna/ asthma or knowledge of premature birth.  Sleeping ok without nocturnal  or early am exacerbation  of respiratory  c/o's or need for noct saba. Also denies any obvious  fluctuation of symptoms with weather or environmental changes or other aggravating or alleviating factors except as outlined above   Current Medications, Allergies, Complete Past Medical History, Past Surgical History, Family History, and Social History were reviewed in [employer] record.  ROS  The following are not active complaints unless bolded sore throat, dysphagia, dental problems, itching, sneezing,  nasal congestion or excess/ purulent secretions, ear ache,   fever,  chills, sweats, unintended wt loss, pleuritic or exertional cp, hemoptysis,  orthopnea pnd or leg swelling, presyncope, palpitations, heartburn, abdominal pain, anorexia, nausea, vomiting, diarrhea  or change in bowel or urinary habits, change in stools or urine, dysuria,hematuria,  rash, arthralgias, visual complaints, headache, numbness weakness or ataxia or problems with walking or coordination,  change in mood/affect or memory.               Objective:   Physical Exam   03/03/2014        137  Wt Readings from Last 3 Encounters:  01/19/14 131 lb (59.421 kg)  12/15/13 135 lb 12.8 oz (61.598 kg)  11/27/13 135 lb 12.8 oz (61.598 kg)          HEENT: nl dentition, turbinates, and orophanx. Nl external ear canals without cough reflex   NECK :  without JVD/Nodes/TM/ nl carotid upstrokes bilaterally   LUNGS: no acc muscle use, clear to A and P bilaterally without cough on insp or exp maneuvers   CV:  RRR  no s3 or murmur or increase in P2, no edema   ABD:  soft and nontender with nl excursion in the supine position. No bruits or organomegaly, bowel sounds nl  MS:  warm without deformities, calf tenderness, cyanosis or clubbing  SKIN: warm and dry without lesions    NEURO:  alert, approp, no deficits      cxr 06/27/13 No acute cardiopulmonary disease identified.     Assessment & Plan:

## 2014-03-03 NOTE — Assessment & Plan Note (Signed)
Try maintain for now on zyrtec at hs/ try off chlortrimeton to see what difference this makes esp off GERD rx, using the reverse of a therapeutic trial.

## 2014-03-22 ENCOUNTER — Other Ambulatory Visit: Payer: Self-pay | Admitting: Internal Medicine

## 2014-04-02 ENCOUNTER — Telehealth: Payer: Self-pay | Admitting: Internal Medicine

## 2014-04-02 NOTE — Telephone Encounter (Signed)
Called spoke with pt. She scheduled an appt to be seen on Monday by RA. Nothing further needed

## 2014-04-05 ENCOUNTER — Ambulatory Visit (INDEPENDENT_AMBULATORY_CARE_PROVIDER_SITE_OTHER): Payer: Medicare Other | Admitting: Pulmonary Disease

## 2014-04-05 ENCOUNTER — Encounter: Payer: Self-pay | Admitting: Pulmonary Disease

## 2014-04-05 VITALS — BP 120/78 | HR 99 | Ht 63.0 in | Wt 134.0 lb

## 2014-04-05 DIAGNOSIS — R05 Cough: Secondary | ICD-10-CM

## 2014-04-05 DIAGNOSIS — R059 Cough, unspecified: Secondary | ICD-10-CM | POA: Diagnosis not present

## 2014-04-05 DIAGNOSIS — R058 Other specified cough: Secondary | ICD-10-CM

## 2014-04-05 MED ORDER — AMOXICILLIN-POT CLAVULANATE 875-125 MG PO TABS: 1.0000 | ORAL_TABLET | Freq: Two times a day (BID) | ORAL | Status: DC

## 2014-04-05 NOTE — Progress Notes (Signed)
   Subjective:    Patient ID: Dorothy Lee, female    DOB: Nov 08, 1940, 73 y.o.   MRN: [id number]  HPI  9 yowf never smoker with recurrent cough x 2011 responsive to prednisone for weeks at a time s the need for cough meds (during the day, still tessalon at hs)    Significant tests/ events  Full PFT's 11/2011:  Normal  CT sinuses 11/2012:  Thickening of sinuses without significant fluid.  Followed by ENT Wilburn Cornelia) -Med Calendar 12/15/2013  - Allergy profile 01/19/14 >  Eos 12.8% and IgE 249 with Pos Rast for trees, grasses ragweeds   11/27/2013 >>Augmentin 875 mg take one pill twice daily X 10 days -  Prednisone taper  rx symbicort 80     01/19/2014 pred taper & symbicort 80 added   04/05/2014  Chief Complaint  Patient presents with  . Acute Visit    Sinus pressure, pnd, cough with clear / green mucus x1 week   she had a cough that started out as a cold & has turned into allergies OV few weeks ago where she was feeling the best she had in years  Review of Systems neg for any significant sore throat, dysphagia, itching, sneezing, nasal congestion or excess/ purulent secretions, fever, chills, sweats, unintended wt loss, pleuritic or exertional cp, hempoptysis, orthopnea pnd or change in chronic leg swelling. Also denies presyncope, palpitations, heartburn, abdominal pain, nausea, vomiting, diarrhea or change in bowel or urinary habits, dysuria,hematuria, rash, arthralgias, visual complaints, headache, numbness weakness or ataxia.     Objective:   Physical Exam  Gen. Pleasant, well-nourished, in no distress ENT - no lesions, no post nasal drip Neck: No JVD, no thyromegaly, no carotid bruits Lungs: no use of accessory muscles, no dullness to percussion, clear without rales or rhonchi  Cardiovascular: Rhythm regular, heart sounds  normal, no murmurs or gallops, no peripheral edema Musculoskeletal: No deformities, no cyanosis or clubbing         Assessment & Plan:

## 2014-04-05 NOTE — Assessment & Plan Note (Signed)
Augmentin twice daily x7days Take probiotic with this Ok to take Mucinex D OK to take claritin instead of zyrtec Keep appt with dr Melvyn Novas if no better

## 2014-04-05 NOTE — Patient Instructions (Signed)
Augmentin twice daily x7days Take probiotic with this Ok to take Mucinex D OK to take claritin instead of zyrtec Keep appt with dr Melvyn Novas if no better

## 2014-04-14 ENCOUNTER — Ambulatory Visit: Payer: Medicare Other | Admitting: Internal Medicine

## 2014-05-04 ENCOUNTER — Other Ambulatory Visit: Payer: Self-pay | Admitting: *Deleted

## 2014-05-04 MED ORDER — ROSUVASTATIN CALCIUM 10 MG PO TABS: 10.0000 mg | ORAL_TABLET | Freq: Every morning | ORAL | Status: DC

## 2014-05-15 ENCOUNTER — Encounter: Payer: Self-pay | Admitting: Cardiology

## 2014-06-01 ENCOUNTER — Telehealth: Payer: Self-pay | Admitting: Internal Medicine

## 2014-06-01 MED ORDER — BUDESONIDE-FORMOTEROL FUMARATE 80-4.5 MCG/ACT IN AERO: INHALATION_SPRAY | RESPIRATORY_TRACT | Status: DC

## 2014-06-01 NOTE — Telephone Encounter (Signed)
Called and spoke with pt and she is aware of rx for the symbicort that has been sent to the pharmacy.  Nothing further is needed.

## 2014-06-02 ENCOUNTER — Telehealth: Payer: Self-pay | Admitting: Internal Medicine

## 2014-06-02 MED ORDER — BUDESONIDE-FORMOTEROL FUMARATE 80-4.5 MCG/ACT IN AERO: INHALATION_SPRAY | RESPIRATORY_TRACT | Status: DC

## 2014-06-02 NOTE — Telephone Encounter (Signed)
Called and spoke with pt and she is aware of samples that have been left up front for her and she will come by tomorrow to pick these up.  Nothing further is needed.

## 2014-06-08 ENCOUNTER — Ambulatory Visit (INDEPENDENT_AMBULATORY_CARE_PROVIDER_SITE_OTHER): Payer: Medicare Other | Admitting: Internal Medicine

## 2014-06-08 ENCOUNTER — Encounter: Payer: Self-pay | Admitting: Internal Medicine

## 2014-06-08 VITALS — BP 130/84 | HR 86 | Temp 98.1°F | Ht 63.0 in | Wt 137.0 lb

## 2014-06-08 DIAGNOSIS — J45991 Cough variant asthma: Secondary | ICD-10-CM

## 2014-06-08 NOTE — Progress Notes (Signed)
Subjective:   Patient ID: Dorothy Lee, female    DOB: [birth year]    MRN: [id number]   Brief patient profile:  42 yowf never smoker with recurrent cough x 2011 responsive to prednisone for weeks at a time s the need for cough meds (during the day, still tessalon at hs)  referred for consultation by Dr Basilio Cairo having seen Dr Gwenette Greet previously with dx of pnds/cyclical coughing but requesting a second opinion as cough never resolved until started symbicort 80 in 11/2013 c/w cough variant asthma   History of Present Illness  08/24/2013 1st [facility] office visit/ Alyus Mofield  Chief Complaint  Patient presents with  . Acute Visit    Pt states cough no better since last ov here in Sept. Cough is occ prod with foamy, white sputum.   no better with abx/ ent intervention/ always better with prednisone.  Presently finishing up round of abx/prednisone and "it's going away like it always does on prednisone" Tends to be worse when lies down with persistent sense of pnds which can disturb sleep as well but amount of mucus does not peak in ams and never purulent. No better/ worse on inhalers Assoc with urinary incont when severe rec Dexilant 60  Mg Take 30-60 min before first meal of the day  And at bedtime Pepcid 20 mg and chlortrimeton 4 mg until return Stop zyrtec  GERD (REFLUX)   For cough you can take the cough syrup or tramadol as needed  For flare return here with all medications in hand including all over the counters all inhalers    09/01/2013 Chief Complaint  Patient presents with  . Acute Visit    Cough with white and yellow mucus  Pt saw MW 11/17 and rec Arlyce Harman and cxr 11/2010 normal  Ct sinuses 05/2011: Acute and chronic sinusitis.  Full PFT's 11/2011: Normal  Dexilant 60 Mg Take 30-60 min before first meal of the day And at bedtime Pepcid 20 mg and chlortrimeton 4 mg until return  Stop zyrtec  Cough worse, now thick yellow. No real heartburn. No dyspnea.  Produces mucus all the  time.  rec Strict reflux diet Stay on Dexilant one daily and pepcid at night Prednisone 10mg  Take 4 for four days 3 for four days 2 for four days 1 for four days Augmentin twice daily for 10days Flonase two puff ea nostril Twice daily Stay on chlorpheniramine Follow cough protocol Stay off work ONE week, voice rest CT Sinus > declined     11/27/2013 f/u ov/Carlyon Nolasco re:  Cough  X 3y  dexilant ac daily and hs pepcid Chief Complaint  Patient presents with  . Follow-up    Pt states her cough has been worse for the past wk. She states she is coughing "nonstop".  Cough is prod with large amounts of white to yellow sputum.  Sometimes coughs until vomits.   worse when head hits pillow  Never followed through on prev recs and frustrated "never really better" >>Augmentin 875 mg take one pill twice daily  X 10 days - Prednisone taper  rx symbicort 80      01/19/2014 f/u ov/Vernisha Bacote re: cough  Chief Complaint  Patient presents with  . Acute Visit    Pt states cough returned 2 wks ago. Cough is prod with thick, white sputum. She states sometimes she has a hard time catching her breath and relates to s/e of tramadol.   cough was completely gone on rx before relapse/did not maintain on symbicort  consistently  rec Prednisone 10 mg take  4 each am x 2 days,   2 each am x 2 days,  1 each am x 2 days and stop  symbicort 80 Take 2 puffs first thing in am and then another 2 puffs about 12 hours later   03/03/2014 f/u ov/Kwame Ryland re: best in years, attributes to starting symbicort 80 2bid/ not using med calendar, easily confused with details of care.   Chief Complaint  Patient presents with  . Follow-up    Pt states that her cough has resolved. No new co's today.     Only symptom is constant runny nose, watery, not waking at night at all - not needing any cough suppression at all  rec Continue symbicort 80 Take 2 puffs first thing in am and then another 2 puffs about 12 hours later.  Work on inhaler  technique:  relax and gently blow all the way out then take a nice smooth deep breath back in, triggering the inhaler at same time you start breathing in.  Hold for up to 5 seconds if you can.  Rinse and gargle with water when done Stop all acid suppression and chlortimeton at bedtime to see what difference this makes Please schedule a follow up office visit in 6 weeks, call sooner if needed with all medications in hand    06/08/2014 f/u ov/Khayri Kargbo re: prob cough variant asthma Chief Complaint  Patient presents with  . Follow-up    Pt c/o increased cough x 10 days- prod with minimal clear sputum. She is only using syumbicort prn- maybe once per wk.   cough worse at hs, not using any gerd rx or 1st gen h1 - no saba   No obvious day to day or daytime variabilty or sob  or cp or chest tightness, subjective wheeze overt  hb symptoms. No unusual exp hx or h/o childhood pna/ asthma or knowledge of premature birth.   . Also denies any obvious fluctuation of symptoms with weather or environmental changes or other aggravating or alleviating factors except as outlined above   Current Medications, Allergies, Complete Past Medical History, Past Surgical History, Family History, and Social History were reviewed in [employer] record.  ROS  The following are not active complaints unless bolded sore throat, dysphagia, dental problems, itching, sneezing,  nasal congestion or excess/ purulent secretions, ear ache,   fever, chills, sweats, unintended wt loss, pleuritic or exertional cp, hemoptysis,  orthopnea pnd or leg swelling, presyncope, palpitations, heartburn, abdominal pain, anorexia, nausea, vomiting, diarrhea  or change in bowel or urinary habits, change in stools or urine, dysuria,hematuria,  rash, arthralgias, visual complaints, headache, numbness weakness or ataxia or problems with walking or coordination,  change in mood/affect or memory.               Objective:   Physical  Exam   03/03/2014        137 >  06/08/2014  137  Wt Readings from Last 3 Encounters:  01/19/14 131 lb (59.421 kg)  12/15/13 135 lb 12.8 oz (61.598 kg)  11/27/13 135 lb 12.8 oz (61.598 kg)          HEENT: nl dentition, turbinates, and orophanx. Nl external ear canals without cough reflex   NECK :  without JVD/Nodes/TM/ nl carotid upstrokes bilaterally   LUNGS: no acc muscle use, clear to A and P bilaterally without cough on insp or exp maneuvers   CV:  RRR  no s3 or  murmur or increase in P2, no edema   ABD:  soft and nontender with nl excursion in the supine position. No bruits or organomegaly, bowel sounds nl  MS:  warm without deformities, calf tenderness, cyanosis or clubbing  SKIN: warm and dry without lesions    NEURO:  alert, approp, no deficits      cxr 06/27/13 No acute cardiopulmonary disease identified.     Assessment & Plan:

## 2014-06-08 NOTE — Patient Instructions (Signed)
symbicort 160 Take 2 puffs first thing in am and then another 2 puffs about 12 hours later   If the cough continues at night you can  add pepcid 20 mg and chlortrimeton 4 mg at bedtime   See Tammy NP w/in 2 weeks with all your medications, even over the counter meds, separated in two separate bags, the ones you take no matter what vs the ones you stop once you feel better and take only as needed when you feel you need them.   Tammy  will generate for you a new user friendly medication calendar that will put [place] all on the same page re: your medication use.     Without this process, it simply isn't possible to assure that we are providing  your outpatient care  with  the attention to detail we feel you deserve.   If we cannot assure that you're getting that kind of care,  then we cannot manage your problem effectively from this clinic.  Once you have seen Tammy and we are sure that we're all on the same page with your medication use she will arrange follow up with me.

## 2014-06-09 NOTE — Assessment & Plan Note (Addendum)
DDX of  difficult airways management all start with A and  include Adherence, Ace Inhibitors, Acid Reflux, Active Sinus Disease, Alpha 1 Antitripsin deficiency, Anxiety masquerading as Airways dz,  ABPA,  allergy(esp in young), Aspiration (esp in elderly), Adverse effects of DPI,  Active smokers, plus two Bs  = Bronchiectasis and Beta blocker use..and one C= CHF  Adherence is always the initial "prime suspect" and is a multilayered concern that requires a "trust but verify" approach in every patient - starting with knowing how to use medications, especially inhalers, correctly, keeping up with refills and understanding the fundamental difference between maintenance and prns vs those medications only taken for a very short course and then stopped and not refilled.  -The proper method of use, as well as anticipated side effects, of a metered-dose inhaler are discussed and demonstrated to the patient. Improved effectiveness after extensive coaching during this visit to a level of approximately  75% so rec restart symbicrort 80 2bid  - struggling with maint vs prns and effective action plans and lost her med calendar:  To keep things simple, I have asked the patient to first separate medicines that are perceived as maintenance, that is to be taken daily "no matter what", from those medicines that are taken on only on an as-needed basis and I have given the patient examples of both, and then return to see our NP to generate a  detailed  medication calendar which should be followed until the next physician sees the patient and updates it.    ? Acid (or non-acid) GERD > always difficult to exclude as up to 75% of pts in some series report no assoc GI/ Heartburn symptoms> rec   Add noct  acid suppression and diet restrictions/ reviewed and instructions given in writing. - see instructions

## 2014-06-29 ENCOUNTER — Other Ambulatory Visit: Payer: Medicare Other

## 2014-07-01 ENCOUNTER — Ambulatory Visit (INDEPENDENT_AMBULATORY_CARE_PROVIDER_SITE_OTHER): Payer: Medicare Other | Admitting: Adult Health

## 2014-07-01 ENCOUNTER — Encounter: Payer: Self-pay | Admitting: Adult Health

## 2014-07-01 VITALS — BP 138/76 | HR 79 | Temp 97.6°F | Ht 63.0 in | Wt 136.0 lb

## 2014-07-01 DIAGNOSIS — Z23 Encounter for immunization: Secondary | ICD-10-CM | POA: Diagnosis not present

## 2014-07-01 DIAGNOSIS — J45991 Cough variant asthma: Secondary | ICD-10-CM

## 2014-07-01 NOTE — Assessment & Plan Note (Signed)
Recurrent flare with GERD and AR triggers -improved control  Patient's medications were reviewed today and patient education was given. Computerized medication calendar was adjusted/completed   Plan  ollow med calendar closley and bring to each visit.  May change Chlortrimeton and Prilosec to As needed list.  Follow up Dr. Melvyn Novas  In 3 months and As needed   Please contact office for sooner follow up if symptoms do not improve or worsen

## 2014-07-01 NOTE — Patient Instructions (Signed)
Follow med calendar closley and bring to each visit.  May change Chlortrimeton and Prilosec to As needed list.  Follow up Dr. Melvyn Novas  In 3 months and As needed   Please contact office for sooner follow up if symptoms do not improve or worsen

## 2014-07-01 NOTE — Progress Notes (Signed)
Subjective:   Patient ID: Dorothy Lee, female    DOB: 1941-04-05    MRN: [id number]   Brief patient profile:  61 yowf never smoker with recurrent cough x 2011 responsive to prednisone for weeks at a time s the need for cough meds (during the day, still tessalon at hs)  referred for consultation by Dr Basilio Cairo having seen Dr Gwenette Greet previously with dx of pnds/cyclical coughing but requesting a second opinion as cough never resolved until started symbicort 80 in 11/2013 c/w cough variant asthma   History of Present Illness  08/24/2013 1st [facility] office visit/ [facility]  Chief Complaint  Patient presents with  . Acute Visit    Pt states cough no better since last ov here in Sept. Cough is occ prod with foamy, white sputum.   no better with abx/ ent intervention/ always better with prednisone.  Presently finishing up round of abx/prednisone and "it's going away like it always does on prednisone" Tends to be worse when lies down with persistent sense of pnds which can disturb sleep as well but amount of mucus does not peak in ams and never purulent. No better/ worse on inhalers Assoc with urinary incont when severe rec Dexilant 60  Mg Take 30-60 min before first meal of the day  And at bedtime Pepcid 20 mg and chlortrimeton 4 mg until return Stop zyrtec  GERD (REFLUX)   For cough you can take the cough syrup or tramadol as needed  For flare return here with all medications in hand including all over the counters all inhalers    09/01/2013 Chief Complaint  Patient presents with  . Acute Visit    Cough with white and yellow mucus  Pt saw MW 11/17 and rec Arlyce Harman and cxr 11/2010 normal  Ct sinuses 05/2011: Acute and chronic sinusitis.  Full PFT's 11/2011: Normal  Dexilant 60 Mg Take 30-60 min before first meal of the day And at bedtime Pepcid 20 mg and chlortrimeton 4 mg until return  Stop zyrtec  Cough worse, now thick yellow. No real heartburn. No dyspnea.  Produces mucus all the  time.  rec Strict reflux diet Stay on Dexilant one daily and pepcid at night Prednisone 10mg  Take 4 for four days 3 for four days 2 for four days 1 for four days Augmentin twice daily for 10days Flonase two puff ea nostril Twice daily Stay on chlorpheniramine Follow cough protocol Stay off work ONE week, voice rest CT Sinus > declined     11/27/2013 f/u ov/[facility] re:  Cough  X 3y  dexilant ac daily and hs pepcid Chief Complaint  Patient presents with  . Follow-up    Pt states her cough has been worse for the past wk. She states she is coughing "nonstop".  Cough is prod with large amounts of white to yellow sputum.  Sometimes coughs until vomits.   worse when head hits pillow  Never followed through on prev recs and frustrated "never really better" >>Augmentin 875 mg take one pill twice daily  X 10 days - Prednisone taper  rx symbicort 80      01/19/2014 f/u ov/[facility] re: cough  Chief Complaint  Patient presents with  . Acute Visit    Pt states cough returned 2 wks ago. Cough is prod with thick, white sputum. She states sometimes she has a hard time catching her breath and relates to s/e of tramadol.   cough was completely gone on rx before relapse/did not maintain on symbicort  consistently  rec Prednisone 10 mg take  4 each am x 2 days,   2 each am x 2 days,  1 each am x 2 days and stop  symbicort 80 Take 2 puffs first thing in am and then another 2 puffs about 12 hours later   03/03/2014 f/u ov/[facility]ert re: best in years, attributes to starting symbicort 80 2bid/ not using med calendar, easily confused with details of care.   Chief Complaint  Patient presents with  . Follow-up    Pt states that her cough has resolved. No new co's today.     Only symptom is constant runny nose, watery, not waking at night at all - not needing any cough suppression at all  rec Continue symbicort 80 Take 2 puffs first thing in am and then another 2 puffs about 12 hours later.  Work on inhaler  technique:  relax and gently blow all the way out then take a nice smooth deep breath back in, triggering the inhaler at same time you start breathing in.  Hold for up to 5 seconds if you can.  Rinse and gargle with water when done Stop all acid suppression and chlortimeton at bedtime to see what difference this makes Please schedule a follow up office visit in 6 weeks, call sooner if needed with all medications in hand    06/08/2014 f/u ov/[facility]ert re: prob cough variant asthma Chief Complaint  Patient presents with  . Follow-up    Pt c/o increased cough x 10 days- prod with minimal clear sputum. She is only using syumbicort prn- maybe once per wk.   cough worse at hs, not using any gerd rx or 1st gen h1 - no saba   No obvious day to day or daytime variabilty or sob  or cp or chest tightness, subjective wheeze overt  hb symptoms. No unusual exp hx or h/o childhood pna/ asthma or knowledge of premature birth.   . Also denies any obvious fluctuation of symptoms with weather or environmental changes or other aggravating or alleviating factors except as outlined above   Current Medications, Allergies, Complete Past Medical History, Past Surgical History, Family History, and Social History were reviewed in [employer] record.  ROS  The following are not active complaints unless bolded sore throat, dysphagia, dental problems, itching, sneezing,  nasal congestion or excess/ purulent secretions, ear ache,   fever, chills, sweats, unintended wt loss, pleuritic or exertional cp, hemoptysis,  orthopnea pnd or leg swelling, presyncope, palpitations, heartburn, abdominal pain, anorexia, nausea, vomiting, diarrhea  or change in bowel or urinary habits, change in stools or urine, dysuria,hematuria,  rash, arthralgias, visual complaints, headache, numbness weakness or ataxia or problems with walking or coordination,  change in mood/affect or memory.               Objective:   Physical  Exam   03/03/2014        137 >  06/08/2014  137  Wt Readings from Last 3 Encounters:  01/19/14 131 lb (59.421 kg)  12/15/13 135 lb 12.8 oz (61.598 kg)  11/27/13 135 lb 12.8 oz (61.598 kg)          HEENT: nl dentition, turbinates, and orophanx. Nl external ear canals without cough reflex   NECK :  without JVD/Nodes/TM/ nl carotid upstrokes bilaterally   LUNGS: no acc muscle use, clear to A and P bilaterally without cough on insp or exp maneuvers   CV:  RRR  no s3 or  murmur or increase in P2, no edema   ABD:  soft and nontender with nl excursion in the supine position. No bruits or organomegaly, bowel sounds nl  MS:  warm without deformities, calf tenderness, cyanosis or clubbing  SKIN: warm and dry without lesions    NEURO:  alert, approp, no deficits      cxr 06/27/13 No acute cardiopulmonary disease identified.     Assessment & Plan:         Subjective:   Patient ID: Dorothy Lee, female    DOB: November 06, 1940    MRN: [id number]   Brief patient profile:  73 yowf never smoker with recurrent cough x 2011 responsive to prednisone for weeks at a time s the need for cough meds (during the day, still tessalon at hs)  referred for consultation by Dr Basilio Cairo having seen Dr Gwenette Greet previously with dx of pnds/cyclical coughing but requesting a second opinion as cough never resolved until started symbicort 80 in 11/2013 c/w cough variant asthma   History of Present Illness  08/24/2013 1st [facility] office visit/ [facility]  Chief Complaint  Patient presents with  . Acute Visit    Pt states cough no better since last ov here in Sept. Cough is occ prod with foamy, white sputum.   no better with abx/ ent intervention/ always better with prednisone.  Presently finishing up round of abx/prednisone and "it's going away like it always does on prednisone" Tends to be worse when lies down with persistent sense of pnds which can disturb sleep as well but amount of mucus does not peak  in ams and never purulent. No better/ worse on inhalers Assoc with urinary incont when severe rec Dexilant 60  Mg Take 30-60 min before first meal of the day  And at bedtime Pepcid 20 mg and chlortrimeton 4 mg until return Stop zyrtec  GERD (REFLUX)   For cough you can take the cough syrup or tramadol as needed  For flare return here with all medications in hand including all over the counters all inhalers    09/01/2013 Chief Complaint  Patient presents with  . Acute Visit    Cough with white and yellow mucus  Pt saw MW 11/17 and rec Arlyce Harman and cxr 11/2010 normal  Ct sinuses 05/2011: Acute and chronic sinusitis.  Full PFT's 11/2011: Normal  Dexilant 60 Mg Take 30-60 min before first meal of the day And at bedtime Pepcid 20 mg and chlortrimeton 4 mg until return  Stop zyrtec  Cough worse, now thick yellow. No real heartburn. No dyspnea.  Produces mucus all the time.  rec Strict reflux diet Stay on Dexilant one daily and pepcid at night Prednisone 10mg  Take 4 for four days 3 for four days 2 for four days 1 for four days Augmentin twice daily for 10days Flonase two puff ea nostril Twice daily Stay on chlorpheniramine Follow cough protocol Stay off work ONE week, voice rest CT Sinus > declined     11/27/2013 f/u ov/[facility] re:  Cough  X 3y  dexilant ac daily and hs pepcid Chief Complaint  Patient presents with  . Follow-up    Pt states her cough has been worse for the past wk. She states she is coughing "nonstop".  Cough is prod with large amounts of white to yellow sputum.  Sometimes coughs until vomits.   worse when head hits pillow  Never followed through on prev recs and frustrated "never really better" >>Augmentin 875 mg take one pill  twice daily  X 10 days - Prednisone taper  rx symbicort 80      01/19/2014 f/u ov/[facility] re: cough  Chief Complaint  Patient presents with  . Acute Visit    Pt states cough returned 2 wks ago. Cough is prod with thick, white sputum. She states  sometimes she has a hard time catching her breath and relates to s/e of tramadol.   cough was completely gone on rx before relapse/did not maintain on symbicort consistently  rec Prednisone 10 mg take  4 each am x 2 days,   2 each am x 2 days,  1 each am x 2 days and stop  symbicort 80 Take 2 puffs first thing in am and then another 2 puffs about 12 hours later   03/03/2014 f/u ov/[facility] re: best in years, attributes to starting symbicort 80 2bid/ not using med calendar, easily confused with details of care.   Chief Complaint  Patient presents with  . Follow-up    Pt states that her cough has resolved. No new co's today.     Only symptom is constant runny nose, watery, not waking at night at all - not needing any cough suppression at all  rec Continue symbicort 80 Take 2 puffs first thing in am and then another 2 puffs about 12 hours later.  Work on inhaler technique:  relax and gently blow all the way out then take a nice smooth deep breath back in, triggering the inhaler at same time you start breathing in.  Hold for up to 5 seconds if you can.  Rinse and gargle with water when done Stop all acid suppression and chlortimeton at bedtime to see what difference this makes Please schedule a follow up office visit in 6 weeks, call sooner if needed with all medications in hand    06/08/2014 f/u ov/[facility]ert re: prob cough variant asthma Chief Complaint  Patient presents with  . Follow-up    Pt c/o increased cough x 10 days- prod with minimal clear sputum. She is only using syumbicort prn- maybe once per wk.   cough worse at hs, not using any gerd rx or 1st gen h1 - no saba  >add zantac/pepcid At bedtime  For cough   07/01/2014 Follow up and Med Review  Hx of cough  Pt returns for follow up and med review  Reviewed all her meds and organized them into a med calendar .  She is taking her meds correctly however stopped protonix and chlortrimeton when cough resolved.  We talked about placing them on  the As needed  For flare of cough and GERD/drainage symptoms She is doing well overall  Moving to [place] , retiring end of year and getting married.  No chest pain, orthopnea, edema or fever.      Current Medications, Allergies, Complete Past Medical History, Past Surgical History, Family History, and Social History were reviewed in [employer] record.  ROS  The following are not active complaints unless bolded sore throat, dysphagia, dental problems, itching, sneezing,  nasal congestion or excess/ purulent secretions, ear ache,   fever, chills, sweats, unintended wt loss, pleuritic or exertional cp, hemoptysis,  orthopnea pnd or leg swelling, presyncope, palpitations, heartburn, abdominal pain, anorexia, nausea, vomiting, diarrhea  or change in bowel or urinary habits, change in stools or urine, dysuria,hematuria,  rash, arthralgias, visual complaints, headache, numbness weakness or ataxia or problems with walking or coordination,  change in mood/affect or memory.  Objective:   Physical Exam   03/03/2014        137 >  06/08/2014  137 >136 07/01/2014        HEENT: nl dentition, turbinates, and orophanx. Nl external ear canals without cough reflex   NECK :  without JVD/Nodes/TM/ nl carotid upstrokes bilaterally   LUNGS: no acc muscle use, clear to A and P bilaterally without cough on insp or exp maneuvers   CV:  RRR  no s3 or murmur or increase in P2, no edema   ABD:  soft and nontender with nl excursion in the supine position. No bruits or organomegaly, bowel sounds nl  MS:  warm without deformities, calf tenderness, cyanosis or clubbing  SKIN: warm and dry without lesions    NEURO:  alert, approp, no deficits      cxr 06/27/13 No acute cardiopulmonary disease identified.     Assessment & Plan:

## 2014-07-06 ENCOUNTER — Ambulatory Visit: Payer: Medicare Other | Admitting: Cardiology

## 2014-07-07 NOTE — Addendum Note (Signed)
Addended by: Parke Poisson E on: 07/07/2014 09:17 AM   Modules accepted: Orders, Medications

## 2014-07-17 IMAGING — CR DG CHEST 2V
2 series · 2 of 2 positions shown · non-contrast
Comparison: None.

CLINICAL DATA: Shortness of breath

CHEST - 2 VIEW

[PA]
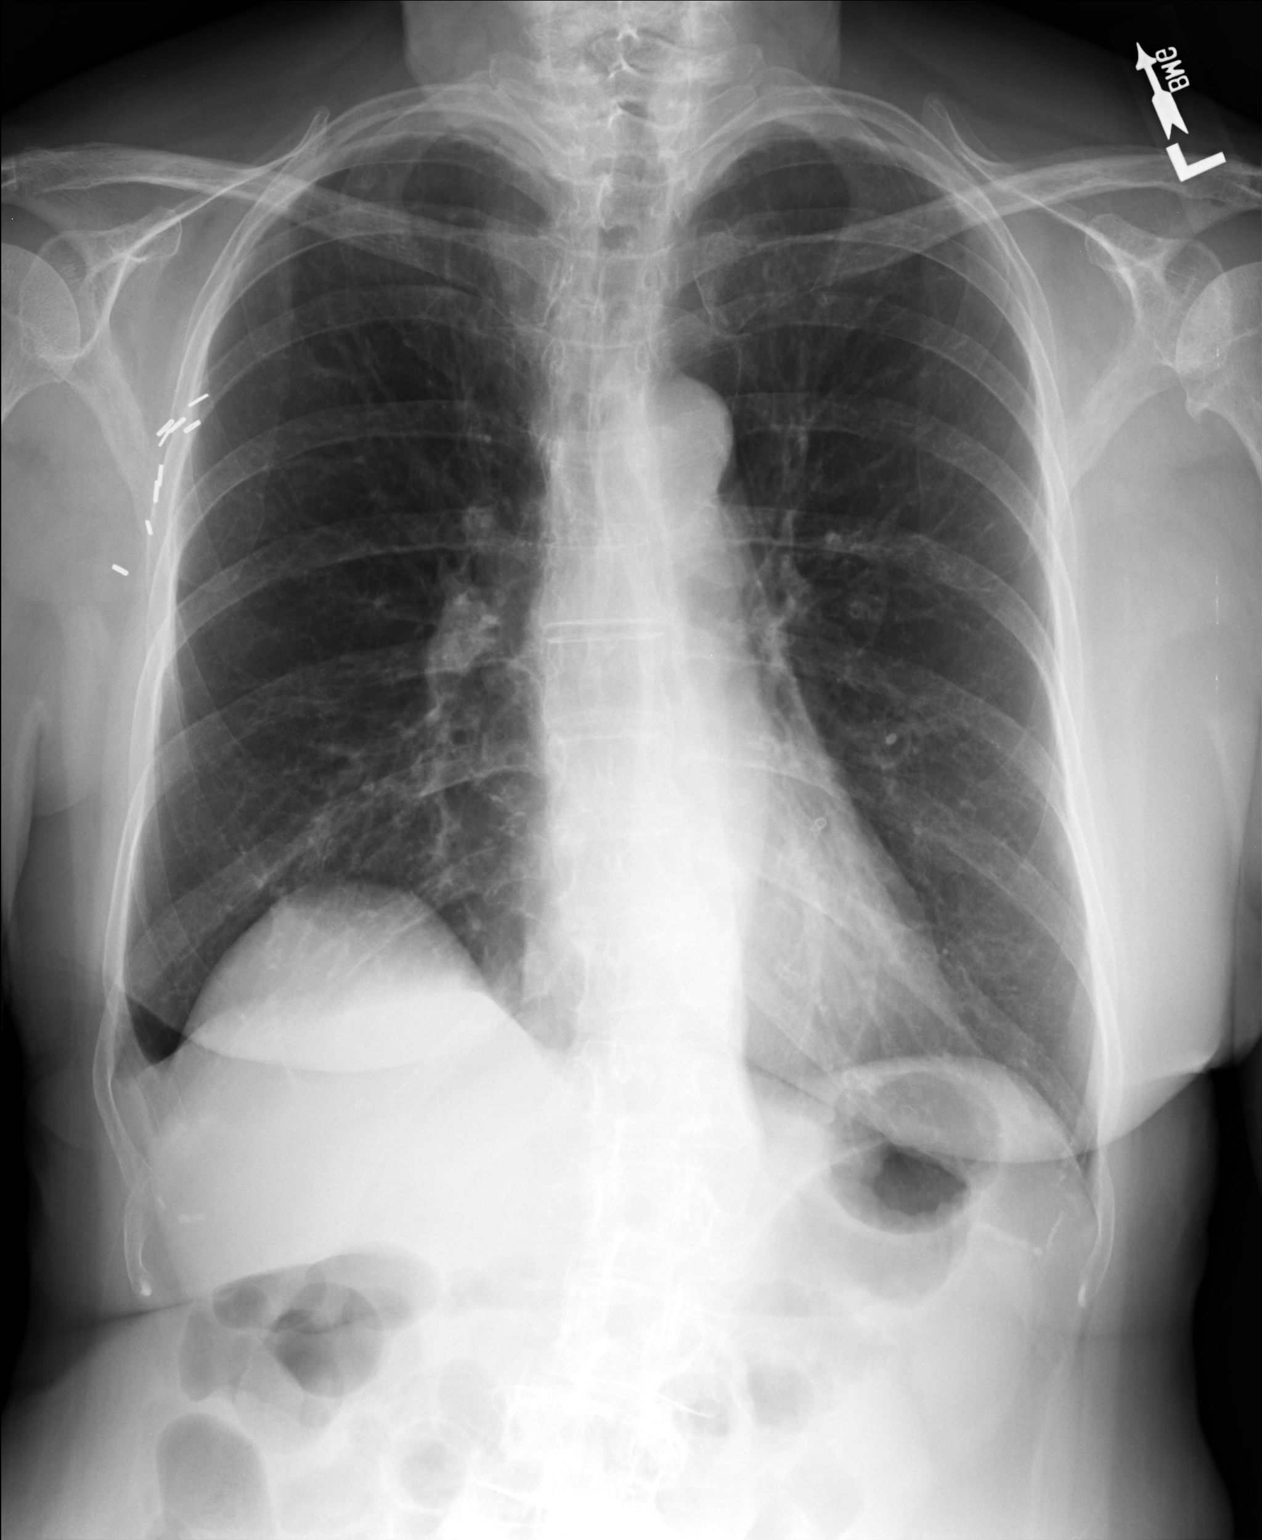

[lateral]
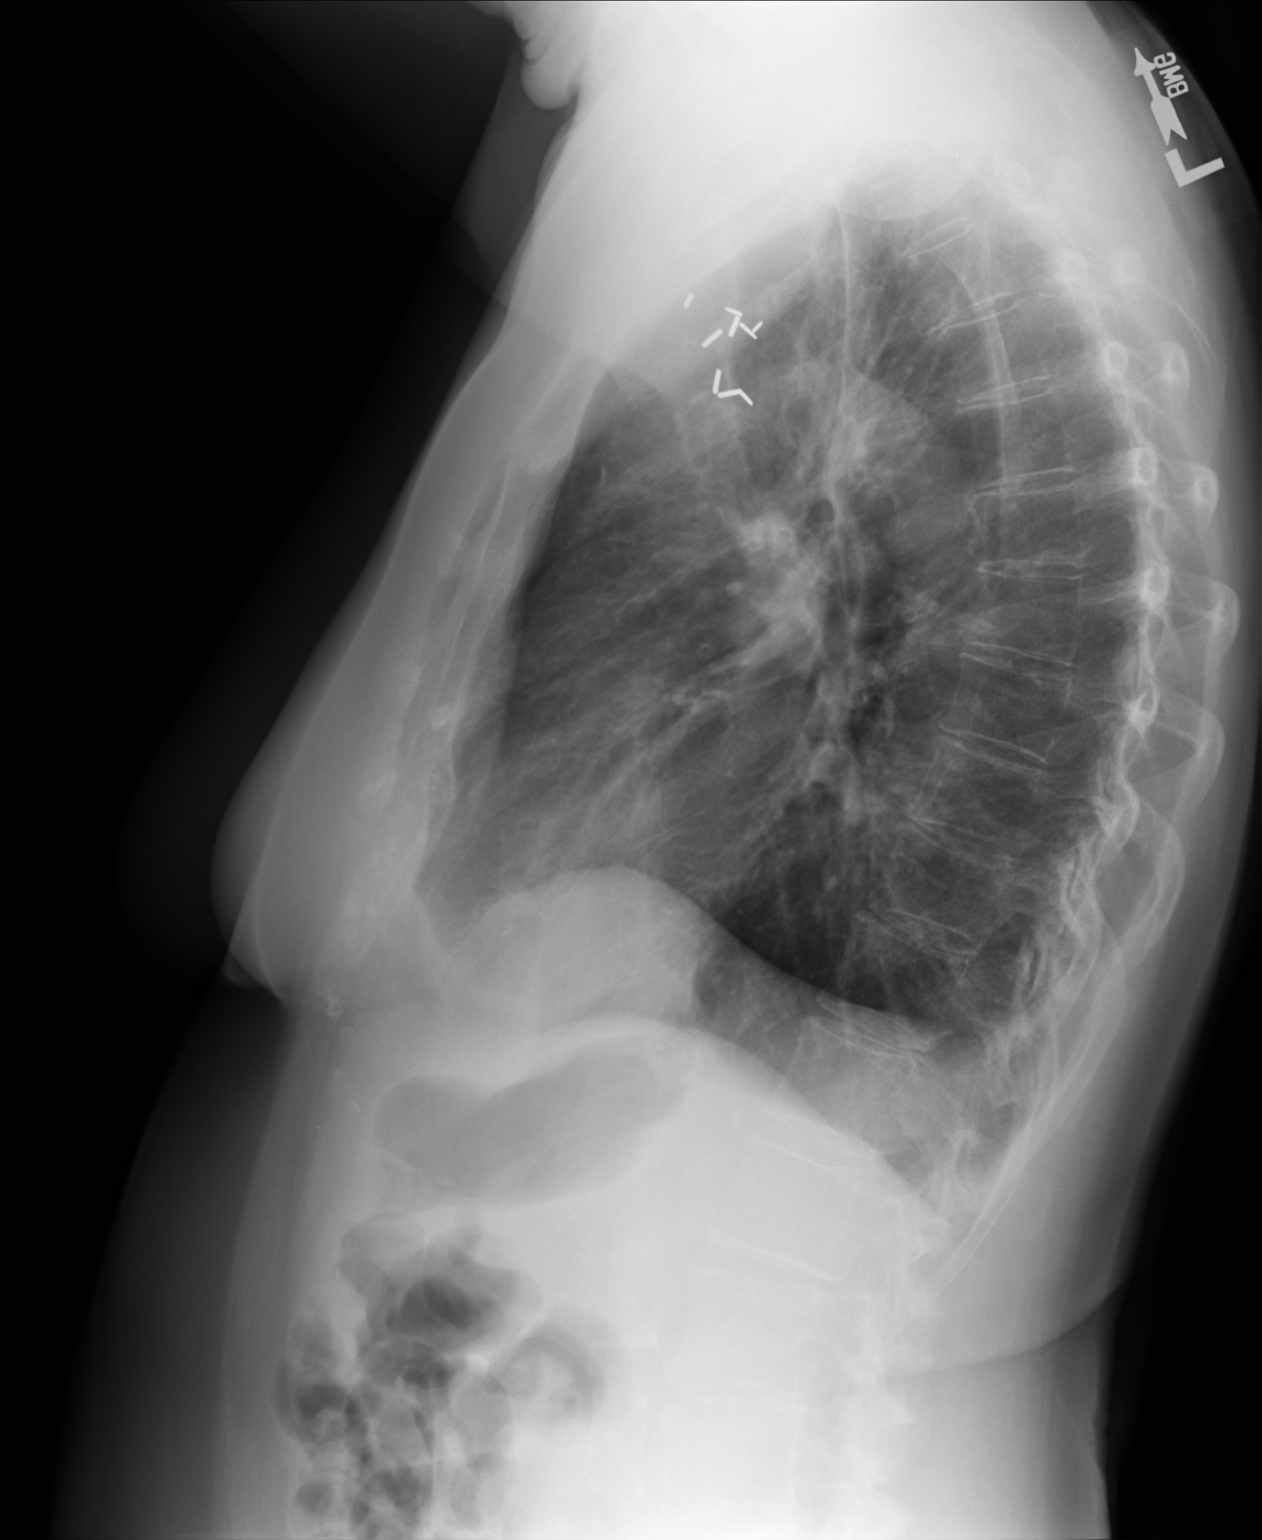

[2 of 2 positions shown; findings below may reference images not displayed]

FINDINGS: There is no focal infiltrate, pulmonary edema, or pleural
effusion.  The mediastinal contour and cardiac silhouette are
normal.  Surgical clips are identified in the right axilla.  The
right breast is smaller compared to left breast question prior
surgery.  No acute abnormality is identified within the visualized
bones. There is scoliosis of spine.
IMPRESSION: No acute cardiopulmonary disease identified.

Clinically significant discrepancy from primary report, if
provided: None

## 2014-07-28 ENCOUNTER — Other Ambulatory Visit: Payer: Self-pay | Admitting: *Deleted

## 2014-07-28 MED ORDER — ROSUVASTATIN CALCIUM 10 MG PO TABS: 10.0000 mg | ORAL_TABLET | Freq: Every day | ORAL | Status: DC

## 2014-08-11 DIAGNOSIS — H2513 Age-related nuclear cataract, bilateral: Secondary | ICD-10-CM | POA: Diagnosis not present

## 2014-08-11 DIAGNOSIS — H35443 Age-related reticular degeneration of retina, bilateral: Secondary | ICD-10-CM | POA: Diagnosis not present

## 2014-08-19 ENCOUNTER — Encounter: Payer: Self-pay | Admitting: Cardiology

## 2014-08-19 ENCOUNTER — Ambulatory Visit: Payer: Medicare Other | Admitting: Cardiology

## 2014-08-19 ENCOUNTER — Ambulatory Visit (INDEPENDENT_AMBULATORY_CARE_PROVIDER_SITE_OTHER): Payer: Medicare Other | Admitting: Cardiology

## 2014-08-19 VITALS — BP 130/88 | HR 88 | Ht 63.0 in | Wt 139.8 lb

## 2014-08-19 DIAGNOSIS — I10 Essential (primary) hypertension: Secondary | ICD-10-CM | POA: Diagnosis not present

## 2014-08-19 DIAGNOSIS — E785 Hyperlipidemia, unspecified: Secondary | ICD-10-CM

## 2014-08-19 DIAGNOSIS — I2510 Atherosclerotic heart disease of native coronary artery without angina pectoris: Secondary | ICD-10-CM

## 2014-08-19 LAB — LIPID PANEL
CHOL/HDL RATIO: 4
CHOLESTEROL: 156 mg/dL (ref 0–200)
HDL: 42.3 mg/dL (ref >39.00)
LDL Cholesterol: 97 mg/dL (ref 0–99)
NonHDL: 113.7
TRIGLYCERIDES: 82 mg/dL (ref 0.0–149.0)
VLDL: 16.4 mg/dL (ref 0.0–40.0)

## 2014-08-19 LAB — ALT: ALT: 23 U/L (ref 0–35)

## 2014-08-19 NOTE — Progress Notes (Signed)
[address] [place], [place]  [address] Phone: [phone] Fax:  [phone]  Date:  08/19/2014   ID:  Citlalic, Norlander 09/06/1941, MRN [id number]  PCP:  Gwendolyn Grant, MD  Cardiologist:  Fransico Him, MD    History of Present Illness: MARRIANNE Lee is a 73 y.o. female with a history of CAD, HTN, dyslipidemia and GERD presents today for followup.  She denies anychest pain or pressure, LE edema, dizziness, palpitations or syncope.  She occasionally has some SOB but this resolves with PRN Symbicort inhaler.    Wt Readings from Last 3 Encounters:  08/19/14 139 lb 12.8 oz (63.413 kg)  07/01/14 136 lb (61.689 kg)  06/08/14 137 lb (62.143 kg)     Past Medical History  Diagnosis Date  . History of breast cancer     right - s/p XRT 1984, surg, no chemo  . Hyperlipidemia   . GERD (gastroesophageal reflux disease)   . Urinary incontinence   . Scoliosis     with mild chronic LBP  . Allergic rhinitis   . ALLERGIC RHINITIS   . Cough     chronic  . HYPERTENSION, BORDERLINE   . ROTATOR CUFF SYNDROME, LEFT   . Diverticulosis   . Glaucoma   . CAD (coronary artery disease)     DES RCA and LAD 02/2008, cath 11/2012 patent RCA and LAD stents with new mild LAD stenosis s/p DES    Current Outpatient Prescriptions  Medication Sig Dispense Refill  . aspirin EC 81 MG tablet Take 81 mg by mouth every morning.    . budesonide-formoterol (SYMBICORT) 80-4.5 MCG/ACT inhaler Take 2 puffs first thing in am and then another 2 puffs about 12 hours later as needed    . cetirizine (ZYRTEC) 10 MG tablet Take 10 mg by mouth at bedtime.    . clopidogrel (PLAVIX) 75 MG tablet Take 75 mg by mouth every morning.     . fluticasone (FLONASE) 50 MCG/ACT nasal spray Place 1 spray into both nostrils 2 (two) times daily.    . nitroGLYCERIN (NITROSTAT) 0.4 MG SL tablet Place 0.4 mg under the tongue every 5 (five) minutes x 3 doses as needed for chest pain. May repeat x3    . omeprazole (PRILOSEC)  20 MG capsule Take 20 mg by mouth daily as needed (cough flare or heartburn).    . raloxifene (EVISTA) 60 MG tablet Take 60 mg by mouth daily.     . ranitidine (ZANTAC) 150 MG tablet Take 150 mg by mouth at bedtime.    . rosuvastatin (CRESTOR) 10 MG tablet Take 1 tablet (10 mg total) by mouth at bedtime. 30 tablet 0  . Sodium Chloride-Sodium Bicarb (AYR SALINE NASAL NETI RINSE) 1.57 G PACK 2 (two) times daily as needed.     No current facility-administered medications for this visit.    Allergies:    Allergies  Allergen Reactions  . Crestor [Rosuvastatin]     Crestor 20 mg caused muscle aches - pt is able to tolerate lower doses (takes 10mg )  . Lipitor [Atorvastatin]     Atorvastatin 40 mg caused fatigue  . Lisinopril Cough  . Simvastatin     Muscle aches    Social History:  The patient  reports that she has never smoked. She has never used smokeless tobacco. She reports that she does not drink alcohol or use illicit drugs.   Family History:  The patient's family history includes Emphysema in her brother and  father; Heart disease in her brother; Lung cancer in her brother; Throat cancer in her brother.   ROS:  Please see the history of present illness.      All other systems reviewed and negative.   PHYSICAL EXAM: VS:  BP 130/88 mmHg  Pulse 88  Ht 5\' 3"  (1.6 m)  Wt 139 lb 12.8 oz (63.413 kg)  BMI 24.77 kg/m2 Well nourished, well developed, in no acute distress HEENT: normal Neck: no JVD Cardiac:  normal S1, S2; RRR; no murmur Lungs:  clear to auscultation bilaterally, no wheezing, rhonchi or rales Abd: soft, nontender, no hepatomegaly Ext: no edema Skin: warm and dry Neuro:  CNs 2-12 intact, no focal abnormalities noted  EKG:  NSR at 86bpm with no ST changes and rSR' on EKG     ASSESSMENT AND PLAN:    1.   CAD S/P DES RCA and LAD 02/2008, cath 11/2012 patent RCA and LAD stents with new mild LAD stenosis s/p DES - continue ASA/Plavix 2. HTN - well  controlled 3. Dyslipidemia - continue crestor  - recheck FLP and ALT  Followup with me in 1 year  Signed, Fransico Him, MD [facility] 08/19/2014 2:24 PM

## 2014-08-19 NOTE — Patient Instructions (Signed)
Your physician recommends that you have lab work TODAY (fasting lipid, ALT).  Your physician wants you to follow-up in: 1 year with Dr. Radford Pax. You will receive a reminder letter in the mail two months in advance. If you don't receive a letter, please call our office to schedule the follow-up appointment.

## 2014-08-30 ENCOUNTER — Other Ambulatory Visit: Payer: Self-pay | Admitting: Cardiology

## 2014-09-01 ENCOUNTER — Ambulatory Visit (INDEPENDENT_AMBULATORY_CARE_PROVIDER_SITE_OTHER): Payer: Medicare Other | Admitting: Adult Health

## 2014-09-01 ENCOUNTER — Encounter: Payer: Self-pay | Admitting: Adult Health

## 2014-09-01 VITALS — BP 130/80 | HR 84 | Temp 98.2°F | Ht 63.0 in | Wt 143.0 lb

## 2014-09-01 DIAGNOSIS — J45991 Cough variant asthma: Secondary | ICD-10-CM

## 2014-09-01 DIAGNOSIS — I2510 Atherosclerotic heart disease of native coronary artery without angina pectoris: Secondary | ICD-10-CM | POA: Diagnosis not present

## 2014-09-01 MED ORDER — HYDROCODONE-HOMATROPINE 5-1.5 MG/5ML PO SYRP: 5.0000 mL | ORAL_SOLUTION | Freq: Four times a day (QID) | ORAL | Status: DC | PRN

## 2014-09-01 MED ORDER — AZITHROMYCIN 250 MG PO TABS: ORAL_TABLET | ORAL | Status: AC

## 2014-09-01 NOTE — Progress Notes (Signed)
Subjective:   Patient ID: Dorothy Lee, female    DOB: 05-21-1941    MRN: [id number]   Brief patient profile:  71 yowf never smoker with recurrent cough x 2011 responsive to prednisone for weeks at a time s the need for cough meds (during the day, still tessalon at hs)  referred for consultation by Dr Basilio Cairo having seen Dr Gwenette Greet previously with dx of pnds/cyclical coughing but requesting a second opinion as cough never resolved until started symbicort 80 in 11/2013 c/w cough variant asthma   History of Present Illness  08/24/2013 1st [facility] office visit/ Wert  Chief Complaint  Patient presents with  . Acute Visit    Pt states cough no better since last ov here in Sept. Cough is occ prod with foamy, white sputum.   no better with abx/ ent intervention/ always better with prednisone.  Presently finishing up round of abx/prednisone and "it's going away like it always does on prednisone" Tends to be worse when lies down with persistent sense of pnds which can disturb sleep as well but amount of mucus does not peak in ams and never purulent. No better/ worse on inhalers Assoc with urinary incont when severe rec Dexilant 60  Mg Take 30-60 min before first meal of the day  And at bedtime Pepcid 20 mg and chlortrimeton 4 mg until return Stop zyrtec  GERD (REFLUX)   For cough you can take the cough syrup or tramadol as needed  For flare return here with all medications in hand including all over the counters all inhalers    09/01/2013 Chief Complaint  Patient presents with  . Acute Visit    Cough with white and yellow mucus  Pt saw MW 11/17 and rec Arlyce Harman and cxr 11/2010 normal  Ct sinuses 05/2011: Acute and chronic sinusitis.  Full PFT's 11/2011: Normal  Dexilant 60 Mg Take 30-60 min before first meal of the day And at bedtime Pepcid 20 mg and chlortrimeton 4 mg until return  Stop zyrtec  Cough worse, now thick yellow. No real heartburn. No dyspnea.  Produces mucus all the  time.  rec Strict reflux diet Stay on Dexilant one daily and pepcid at night Prednisone 10mg  Take 4 for four days 3 for four days 2 for four days 1 for four days Augmentin twice daily for 10days Flonase two puff ea nostril Twice daily Stay on chlorpheniramine Follow cough protocol Stay off work ONE week, voice rest CT Sinus > declined     11/27/2013 f/u ov/Wert re:  Cough  X 3y  dexilant ac daily and hs pepcid Chief Complaint  Patient presents with  . Follow-up    Pt states her cough has been worse for the past wk. She states she is coughing "nonstop".  Cough is prod with large amounts of white to yellow sputum.  Sometimes coughs until vomits.   worse when head hits pillow  Never followed through on prev recs and frustrated "never really better" >>Augmentin 875 mg take one pill twice daily  X 10 days - Prednisone taper  rx symbicort 80      01/19/2014 f/u ov/Wert re: cough  Chief Complaint  Patient presents with  . Acute Visit    Pt states cough returned 2 wks ago. Cough is prod with thick, white sputum. She states sometimes she has a hard time catching her breath and relates to s/e of tramadol.   cough was completely gone on rx before relapse/did not maintain on symbicort  consistently  rec Prednisone 10 mg take  4 each am x 2 days,   2 each am x 2 days,  1 each am x 2 days and stop  symbicort 80 Take 2 puffs first thing in am and then another 2 puffs about 12 hours later   03/03/2014 f/u ov/Wert re: best in years, attributes to starting symbicort 80 2bid/ not using med calendar, easily confused with details of care.   Chief Complaint  Patient presents with  . Follow-up    Pt states that her cough has resolved. No new co's today.     Only symptom is constant runny nose, watery, not waking at night at all - not needing any cough suppression at all  rec Continue symbicort 80 Take 2 puffs first thing in am and then another 2 puffs about 12 hours later.  Work on inhaler  technique:  relax and gently blow all the way out then take a nice smooth deep breath back in, triggering the inhaler at same time you start breathing in.  Hold for up to 5 seconds if you can.  Rinse and gargle with water when done Stop all acid suppression and chlortimeton at bedtime to see what difference this makes Please schedule a follow up office visit in 6 weeks, call sooner if needed with all medications in hand    06/08/2014 f/u ov/Wert re: prob cough variant asthma Chief Complaint  Patient presents with  . Follow-up    Pt c/o increased cough x 10 days- prod with minimal clear sputum. She is only using syumbicort prn- maybe once per wk.   cough worse at hs, not using any gerd rx or 1st gen h1 - no saba    09/01/2014 Acute OV  Patient presents for an acute office visit Patient complains of a cough She  has had a cold and the cough has not gone away. She has not had any wheezing, sob. She has had some chest tightness. Does have quite a bit of postnasal drip. Coughing up some thick white yellow mucus. She denies any chest pain, orthopnea, PND or leg swelling   Current Medications, Allergies, Complete Past Medical History, Past Surgical History, Family History, and Social History were reviewed in [employer] record.  ROS  The following are not active complaints unless bolded sore throat, dysphagia, dental problems, itching, sneezing,  nasal congestion or excess/ purulent secretions, ear ache,   fever, chills, sweats, unintended wt loss, pleuritic or exertional cp, hemoptysis,  orthopnea pnd or leg swelling, presyncope, palpitations, heartburn, abdominal pain, anorexia, nausea, vomiting, diarrhea  or change in bowel or urinary habits, change in stools or urine, dysuria,hematuria,  rash, arthralgias, visual complaints, headache, numbness weakness or ataxia or problems with walking or coordination,  change in mood/affect or memory.               Objective:    Physical Exam   03/03/2014        137 >  06/08/2014  137>143          HEENT: nl dentition, turbinates, and orophanx. Nl external ear canals without cough reflex   NECK :  without JVD/Nodes/TM/ nl carotid upstrokes bilaterally   LUNGS: no acc muscle use, clear to A and P bilaterally without cough on insp or exp maneuvers   CV:  RRR  no s3 or murmur or increase in P2, no edema   ABD:  soft and nontender with nl excursion in the supine position. No  bruits or organomegaly, bowel sounds nl  MS:  warm without deformities, calf tenderness, cyanosis or clubbing  SKIN: warm and dry without lesions    NEURO:  alert, approp, no deficits      cxr 06/27/13 No acute cardiopulmonary disease identified.     Assessment & Plan:

## 2014-09-01 NOTE — Patient Instructions (Addendum)
Zpack take as directed  Add Chlortrimeton 4mg  2 At bedtime   Continue Zyrtec 10mg  daily  Continue on Flonase 1 puff Twice daily   May use Mucinex DM Twice daily  As needed cough/congestion  Hydromet 1 tsp every 6 hrs as needed for cough, may make you sleepy.  Follow up Dr. Melvyn Novas  3 months and As needed   Please contact office for sooner follow up if symptoms do not improve or worsen

## 2014-09-03 NOTE — Telephone Encounter (Signed)
This encounter was created in error - please disregard.

## 2014-09-07 NOTE — Assessment & Plan Note (Signed)
Flare with URI   Plan  Zpack take as directed  Add Chlortrimeton 4mg  2 At bedtime   Continue Zyrtec 10mg  daily  Continue on Flonase 1 puff Twice daily   May use Mucinex DM Twice daily  As needed cough/congestion  Hydromet 1 tsp every 6 hrs as needed for cough, may make you sleepy.  Follow up Dr. Melvyn Novas  3 months and As needed   Please contact office for sooner follow up if symptoms do not improve or worsen

## 2014-09-16 ENCOUNTER — Encounter (HOSPITAL_COMMUNITY): Payer: Self-pay | Admitting: Interventional Cardiology

## 2014-09-21 ENCOUNTER — Telehealth: Payer: Self-pay | Admitting: Cardiology

## 2014-09-21 DIAGNOSIS — E785 Hyperlipidemia, unspecified: Secondary | ICD-10-CM

## 2014-09-21 NOTE — Telephone Encounter (Signed)
Fasting lab appointment made for 3/21. Patient agrees with treatment plan.

## 2014-09-21 NOTE — Telephone Encounter (Signed)
New Message  Pt returning Rn phone call. Please call back and discuss.  

## 2014-10-12 DIAGNOSIS — H2511 Age-related nuclear cataract, right eye: Secondary | ICD-10-CM | POA: Diagnosis not present

## 2014-10-19 DIAGNOSIS — H2511 Age-related nuclear cataract, right eye: Secondary | ICD-10-CM | POA: Diagnosis not present

## 2014-10-19 DIAGNOSIS — H3530 Unspecified macular degeneration: Secondary | ICD-10-CM | POA: Diagnosis not present

## 2014-10-19 DIAGNOSIS — I2510 Atherosclerotic heart disease of native coronary artery without angina pectoris: Secondary | ICD-10-CM | POA: Diagnosis not present

## 2014-10-20 ENCOUNTER — Telehealth: Payer: Self-pay | Admitting: Internal Medicine

## 2014-10-20 DIAGNOSIS — M19012 Primary osteoarthritis, left shoulder: Secondary | ICD-10-CM | POA: Diagnosis not present

## 2014-10-20 MED ORDER — BUDESONIDE-FORMOTEROL FUMARATE 80-4.5 MCG/ACT IN AERO: 2.0000 | INHALATION_SPRAY | Freq: Two times a day (BID) | RESPIRATORY_TRACT | Status: DC

## 2014-10-20 NOTE — Telephone Encounter (Signed)
Advised pt that we do not have any samples at this time. Rx will be mailed to her home address per her request. Nothing further was needed.

## 2014-11-01 DIAGNOSIS — E782 Mixed hyperlipidemia: Secondary | ICD-10-CM | POA: Diagnosis not present

## 2014-11-01 DIAGNOSIS — I119 Hypertensive heart disease without heart failure: Secondary | ICD-10-CM | POA: Diagnosis not present

## 2014-11-01 DIAGNOSIS — I25119 Atherosclerotic heart disease of native coronary artery with unspecified angina pectoris: Secondary | ICD-10-CM | POA: Diagnosis not present

## 2014-11-08 ENCOUNTER — Ambulatory Visit (INDEPENDENT_AMBULATORY_CARE_PROVIDER_SITE_OTHER)
Admission: RE | Admit: 2014-11-08 | Discharge: 2014-11-08 | Disposition: A | Discharge status: Home / Self Care | Payer: Medicare Other | Source: Ambulatory Visit | Attending: Internal Medicine | Admitting: Internal Medicine

## 2014-11-08 ENCOUNTER — Encounter: Payer: Self-pay | Admitting: Internal Medicine

## 2014-11-08 ENCOUNTER — Ambulatory Visit (INDEPENDENT_AMBULATORY_CARE_PROVIDER_SITE_OTHER): Payer: Medicare Other | Admitting: Internal Medicine

## 2014-11-08 DIAGNOSIS — R05 Cough: Secondary | ICD-10-CM | POA: Diagnosis not present

## 2014-11-08 DIAGNOSIS — J45991 Cough variant asthma: Secondary | ICD-10-CM

## 2014-11-08 MED ORDER — MOMETASONE FURO-FORMOTEROL FUM 100-5 MCG/ACT IN AERO: INHALATION_SPRAY | RESPIRATORY_TRACT | Status: DC

## 2014-11-08 MED ORDER — HYDROCODONE-HOMATROPINE 5-1.5 MG/5ML PO SYRP: 5.0000 mL | ORAL_SOLUTION | Freq: Four times a day (QID) | ORAL | Status: DC | PRN

## 2014-11-08 NOTE — Patient Instructions (Addendum)
Try dulera 100 Take 2 puffs first thing in am and then another 2 puffs about 12 hours later with all you medications and your drug formulary with your insurance company  For cough use hydodan 1 tsp every 6 hours if needed  Please remember to go to the x-ray department downstairs for your tests - we will call you with the results when they are available.    If satisfied tell your friends, in not satisfied return to see Tammy NP

## 2014-11-08 NOTE — Progress Notes (Signed)
Subjective:   Patient ID: Dorothy Lee, female    DOB: [birth year]    MRN: [id number]   Brief patient profile:  70 yowf never smoker with recurrent cough x 2011 responsive to prednisone for weeks at a time s the need for cough meds (during the day, still tessalon at hs)  referred for consultation by Dr Basilio Cairo having seen Dr Gwenette Greet previously with dx of pnds/cyclical coughing but requesting a second opinion as cough never resolved until started symbicort 80 in 11/2013 c/w cough variant asthma   History of Present Illness  08/24/2013 1st [facility] office visit/ Telia Amundson  Chief Complaint  Patient presents with  . Acute Visit    Pt states cough no better since last ov here in Sept. Cough is occ prod with foamy, white sputum.   no better with abx/ ent intervention/ always better with prednisone.  Presently finishing up round of abx/prednisone and "it's going away like it always does on prednisone" Tends to be worse when lies down with persistent sense of pnds which can disturb sleep as well but amount of mucus does not peak in ams and never purulent. No better/ worse on inhalers Assoc with urinary incont when severe rec Dexilant 60  Mg Take 30-60 min before first meal of the day  And at bedtime Pepcid 20 mg and chlortrimeton 4 mg until return Stop zyrtec  GERD (REFLUX)   For cough you can take the cough syrup or tramadol as needed  For flare return here with all medications in hand including all over the counters all inhalers    09/01/2013 Chief Complaint  Patient presents with  . Acute Visit    Cough with white and yellow mucus  Pt saw MW 11/17 and rec Arlyce Harman and cxr 11/2010 normal  Ct sinuses 05/2011: Acute and chronic sinusitis.  Full PFT's 11/2011: Normal  Dexilant 60 Mg Take 30-60 min before first meal of the day And at bedtime Pepcid 20 mg and chlortrimeton 4 mg until return  Stop zyrtec  Cough worse, now thick yellow. No real heartburn. No dyspnea.  Produces mucus all the  time.  rec Strict reflux diet Stay on Dexilant one daily and pepcid at night Prednisone 10mg  Take 4 for four days 3 for four days 2 for four days 1 for four days Augmentin twice daily for 10days Flonase two puff ea nostril Twice daily Stay on chlorpheniramine Follow cough protocol Stay off work ONE week, voice rest CT Sinus > declined      03/03/2014 f/u ov/Yzabella Crunk re: best in years, attributes to starting symbicort 80 2bid/ not using med calendar, easily confused with details of care.   Chief Complaint  Patient presents with  . Follow-up    Pt states that her cough has resolved. No new co's today.     Only symptom is constant runny nose, watery, not waking at night at all - not needing any cough suppression at all  rec Continue symbicort 80 Take 2 puffs first thing in am and then another 2 puffs about 12 hours later.  Work on inhaler technique:    Stop all acid suppression and chlortimeton at bedtime to see what difference this makes     11/08/2014 f/u ov/Evanny Ellerbe re: chronic cough/ completely off symbiocrt x one month was on for cough variant asthma Chief Complaint  Patient presents with  . Acute Visit    pt c/o prod cough with yellow mucus, sinus congestion, sob when laying down X3 weeks.  symptom onset was gradual and w/in a week of stopping symbicort despite restarting gerd rx   No obvious day to day or daytime variabilty or assoc doe (still works out at gym)  or cp or chest tightness, subjective wheeze overt sinus or hb symptoms. No unusual exp hx or h/o childhood pna/ asthma or knowledge of premature birth.  Sleeping ok without nocturnal  or early am exacerbation  of respiratory  c/o's or need for noct saba. Also denies any obvious fluctuation of symptoms with weather or environmental changes or other aggravating or alleviating factors except as outlined above   Current Medications, Allergies, Complete Past Medical History, Past Surgical History, Family History, and Social History  were reviewed in [employer] record.  ROS  The following are not active complaints unless bolded sore throat, dysphagia, dental problems, itching, sneezing,  nasal congestion or excess/ purulent secretions, ear ache,   fever, chills, sweats, unintended wt loss, pleuritic or exertional cp, hemoptysis,  orthopnea pnd or leg swelling, presyncope, palpitations, heartburn, abdominal pain, anorexia, nausea, vomiting, diarrhea  or change in bowel or urinary habits, change in stools or urine, dysuria,hematuria,  rash, arthralgias, visual complaints, headache, numbness weakness or ataxia or problems with walking or coordination,  change in mood/affect or memory.                      Objective:   Physical Exam   03/03/2014        137 >  06/08/2014  137>  11/08/2014 144   Pleasant amb wf nad       HEENT: nl dentition, turbinates, and orophanx. Nl external ear canals without cough reflex   NECK :  without JVD/Nodes/TM/ nl carotid upstrokes bilaterally   LUNGS: no acc muscle use, clear to A and P bilaterally without cough on insp or exp maneuvers   CV:  RRR  no s3 or murmur or increase in P2, no edema   ABD:  soft and nontender with nl excursion in the supine position. No bruits or organomegaly, bowel sounds nl  MS:  warm without deformities, calf tenderness, cyanosis or clubbing  SKIN: warm and dry without lesions         CXR PA and Lateral:   11/08/2014 :     I personally reviewed images and agree with radiology impression as follows:    No active cardiopulmonary disease.         Assessment & Plan:

## 2014-11-09 NOTE — Progress Notes (Signed)
Quick Note:  Spoke with pt and notified of results per Dr. Wert. Pt verbalized understanding and denied any questions.  ______ 

## 2014-11-10 ENCOUNTER — Encounter: Payer: Self-pay | Admitting: Internal Medicine

## 2014-11-10 NOTE — Assessment & Plan Note (Signed)
Acute flare in setting of stopping symbicort ? Related to formulary restriction ?   The proper method of use, as well as anticipated side effects, of a metered-dose inhaler are discussed and demonstrated to the patient. Improved effectiveness after extensive coaching during this visit to a level of approximately  75%    I had an extended discussion with the patient reviewing all relevant studies completed to date and  lasting 15 to 20 minutes of a 25 minute visit on the following ongoing concerns: 1) she clearly benefits from low dose ICS and if achieves good control on dulera 100 (presumably formulary will cover since won't cover symbicort) then step down to qvar a reasonable next step 2) whenever coughing, since secondary gerd is an issue, needs max rx that can be stopped once she's achieved control of the cough for at least a week off cough meds 3) ok to use cough suppression with narcs only acutely, not chronically , and if needs again then should return first with all meds in hand to be sure she's doing what we've asked her to do 4) formulary concerns addressed.    Each maintenance medication was reviewed in detail including most importantly the difference between maintenance and as needed and under what circumstances the prns are to be used.  Please see instructions for details which were reviewed in writing and the patient given a copy.

## 2014-12-21 DIAGNOSIS — R079 Chest pain, unspecified: Secondary | ICD-10-CM | POA: Diagnosis not present

## 2014-12-21 DIAGNOSIS — I25119 Atherosclerotic heart disease of native coronary artery with unspecified angina pectoris: Secondary | ICD-10-CM | POA: Diagnosis not present

## 2014-12-21 DIAGNOSIS — R0601 Orthopnea: Secondary | ICD-10-CM | POA: Diagnosis not present

## 2014-12-21 DIAGNOSIS — I119 Hypertensive heart disease without heart failure: Secondary | ICD-10-CM | POA: Diagnosis not present

## 2014-12-27 ENCOUNTER — Other Ambulatory Visit: Payer: Medicare Other | Admitting: *Deleted

## 2014-12-28 DIAGNOSIS — R079 Chest pain, unspecified: Secondary | ICD-10-CM | POA: Diagnosis not present

## 2014-12-28 DIAGNOSIS — R0602 Shortness of breath: Secondary | ICD-10-CM | POA: Diagnosis not present

## 2014-12-28 DIAGNOSIS — I2510 Atherosclerotic heart disease of native coronary artery without angina pectoris: Secondary | ICD-10-CM | POA: Diagnosis not present

## 2014-12-28 DIAGNOSIS — I119 Hypertensive heart disease without heart failure: Secondary | ICD-10-CM | POA: Diagnosis not present

## 2015-01-04 DIAGNOSIS — R030 Elevated blood-pressure reading, without diagnosis of hypertension: Secondary | ICD-10-CM | POA: Diagnosis not present

## 2015-01-04 DIAGNOSIS — J029 Acute pharyngitis, unspecified: Secondary | ICD-10-CM | POA: Diagnosis not present

## 2015-01-17 DIAGNOSIS — L812 Freckles: Secondary | ICD-10-CM | POA: Diagnosis not present

## 2015-01-17 DIAGNOSIS — L821 Other seborrheic keratosis: Secondary | ICD-10-CM | POA: Diagnosis not present

## 2015-01-17 DIAGNOSIS — L814 Other melanin hyperpigmentation: Secondary | ICD-10-CM | POA: Diagnosis not present

## 2015-01-17 DIAGNOSIS — D1801 Hemangioma of skin and subcutaneous tissue: Secondary | ICD-10-CM | POA: Diagnosis not present

## 2015-01-17 DIAGNOSIS — Z809 Family history of malignant neoplasm, unspecified: Secondary | ICD-10-CM | POA: Diagnosis not present

## 2015-01-17 DIAGNOSIS — R05 Cough: Secondary | ICD-10-CM | POA: Diagnosis not present

## 2015-01-19 ENCOUNTER — Encounter: Payer: Self-pay | Admitting: Internal Medicine

## 2015-01-19 ENCOUNTER — Ambulatory Visit (INDEPENDENT_AMBULATORY_CARE_PROVIDER_SITE_OTHER): Payer: Medicare Other | Admitting: Internal Medicine

## 2015-01-19 VITALS — BP 130/84 | HR 85 | Temp 97.8°F | Ht 63.0 in | Wt 141.0 lb

## 2015-01-19 DIAGNOSIS — J069 Acute upper respiratory infection, unspecified: Secondary | ICD-10-CM

## 2015-01-19 DIAGNOSIS — J209 Acute bronchitis, unspecified: Secondary | ICD-10-CM | POA: Diagnosis not present

## 2015-01-19 MED ORDER — HYDROCODONE-HOMATROPINE 5-1.5 MG/5ML PO SYRP: 5.0000 mL | ORAL_SOLUTION | Freq: Three times a day (TID) | ORAL | Status: DC | PRN

## 2015-01-19 MED ORDER — AMOXICILLIN-POT CLAVULANATE 875-125 MG PO TABS: 1.0000 | ORAL_TABLET | Freq: Two times a day (BID) | ORAL | Status: DC

## 2015-01-19 MED ORDER — PREDNISONE 20 MG PO TABS: 20.0000 mg | ORAL_TABLET | Freq: Two times a day (BID) | ORAL | Status: DC

## 2015-01-19 NOTE — Progress Notes (Signed)
Pre visit review using our clinic review tool, if applicable. No additional management support is needed unless otherwise documented below in the visit note. 

## 2015-01-19 NOTE — Patient Instructions (Signed)
Plain Mucinex (NOT D) for thick secretions ;force NON dairy fluids .   Nasal cleansing in the shower as discussed with lather of mild shampoo.After 10 seconds wash off lather while  exhaling through nostrils. Make sure that all residual soap is removed to prevent irritation.  Flonase OR Nasacort AQ 1 spray in each nostril twice a day as needed. Use the "crossover" technique into opposite nostril spraying toward opposite ear @ 45 degree angle, not straight up into nostril.  Plain Allegra (NOT D )  160 daily , Loratidine 10 mg , OR Zyrtec 10 mg @ bedtime  as needed for itchy eyes & sneezing.  Reflux of gastric acid may be asymptomatic as this may occur mainly during sleep.The triggers for reflux  include stress; the "aspirin family" ; alcohol; peppermint; and caffeine (coffee, tea, cola, and chocolate). The aspirin family would include aspirin and the nonsteroidal agents such as ibuprofen &  Naproxen. Tylenol would not cause reflux. If having symptoms ; food & drink should be avoided for @ least 2 hours before going to bed.  

## 2015-01-19 NOTE — Progress Notes (Signed)
   Subjective:    Patient ID: Dorothy Lee, female    DOB: 07-Feb-1941, 74 y.o.   MRN: [id number]  HPI   She states that her cough is perennial not seasonal. This has been an intermittent problem for 4 years. She has been evaluated in detail by Dr Gayla Medicus and has been diagnosed as having cough variant asthma.  At this time she describes equal volumes of yellow secretions from her head as well as chest. She's had some watery eyes but no other extrinsic symptoms.  She's never smoked. She has no history of asthma. She does have reflux but is on medication.  Her father was a smoker and had emphysema.   Review of Systems  She specifically denies frontal headache , facial pain, otic pain, otic discharge. She also denies shortness of breath or wheezing. The present illness has not been associated with fever, chills, or sweats.     Objective:   Physical Exam  Pertinent or positive findings include : She appears much younger than her stated age. Tympanic membranes are dull.  Nares are slightly boggy and edematous. There is minimal erythema.  She has profoundly severe paroxysmal coughing which is nonproductive  General appearance:Adequately nourished; no acute distress or increased work of breathing is present.   Lymphatic: No  lymphadenopathy about the head, neck, or axilla . Eyes: No conjunctival inflammation or lid edema is present. There is no scleral icterus. Ears:  External ear exam shows no significant lesions or deformities.  Otoscopic examination reveals clear canals, tympanic membranes are intact bilaterally without bulging, retraction, inflammation or discharge. Nose:  External nasal examination shows no deformity or inflammation. No septal dislocation or deviation.No obstruction to airflow.  Oral exam: Dental hygiene is good; lips and gums are healthy appearing.There is no oropharyngeal erythema or exudate . Neck:  No deformities, thyromegaly, masses, or tenderness noted.    Supple with full range of motion without pain.  Heart:  Normal rate and regular rhythm. S1 and S2 normal without gallop, murmur, click, rub or other extra sounds.  Lungs:Chest clear to auscultation; no wheezes, rhonchi,rales ,or rubs present. Extremities:  No cyanosis, edema, or clubbing  noted  Skin: Warm & dry w/o tenting or jaundice. No significant lesions or rash.       Assessment & Plan:   #1 acute bronchitis  #2 upper respiratory tract infection #3 cough variant asthma  Plan: See orders and recommendations

## 2015-03-15 DIAGNOSIS — H4011X4 Primary open-angle glaucoma, indeterminate stage: Secondary | ICD-10-CM | POA: Diagnosis not present

## 2015-03-15 DIAGNOSIS — H2512 Age-related nuclear cataract, left eye: Secondary | ICD-10-CM | POA: Diagnosis not present

## 2015-04-06 DIAGNOSIS — H2512 Age-related nuclear cataract, left eye: Secondary | ICD-10-CM | POA: Diagnosis not present

## 2015-04-06 DIAGNOSIS — H04123 Dry eye syndrome of bilateral lacrimal glands: Secondary | ICD-10-CM | POA: Diagnosis not present

## 2015-04-06 DIAGNOSIS — H4011X2 Primary open-angle glaucoma, moderate stage: Secondary | ICD-10-CM | POA: Diagnosis not present

## 2015-04-18 ENCOUNTER — Other Ambulatory Visit: Payer: Self-pay

## 2015-04-18 DIAGNOSIS — Z1231 Encounter for screening mammogram for malignant neoplasm of breast: Secondary | ICD-10-CM

## 2015-04-20 DIAGNOSIS — M19012 Primary osteoarthritis, left shoulder: Secondary | ICD-10-CM | POA: Diagnosis not present

## 2015-04-27 DIAGNOSIS — M47812 Spondylosis without myelopathy or radiculopathy, cervical region: Secondary | ICD-10-CM | POA: Diagnosis not present

## 2015-04-27 DIAGNOSIS — M47816 Spondylosis without myelopathy or radiculopathy, lumbar region: Secondary | ICD-10-CM | POA: Diagnosis not present

## 2015-04-27 DIAGNOSIS — M9901 Segmental and somatic dysfunction of cervical region: Secondary | ICD-10-CM | POA: Diagnosis not present

## 2015-04-27 DIAGNOSIS — M9903 Segmental and somatic dysfunction of lumbar region: Secondary | ICD-10-CM | POA: Diagnosis not present

## 2015-04-29 ENCOUNTER — Telehealth: Payer: Self-pay | Admitting: Internal Medicine

## 2015-04-29 DIAGNOSIS — Z853 Personal history of malignant neoplasm of breast: Secondary | ICD-10-CM

## 2015-04-29 NOTE — Telephone Encounter (Signed)
Patient has purchased bra's at the [facility] in [place].  The bra is for women who have had a lumpectomy.  The bra has silicon in it to make each breast equal.  Patient states she found out she can file this with her insurance but she needs a script written out by Dr. Asa Lente.  Patient would like script to be faxed to [phone].  Patient is going to see if Tammy in Pulmonary could fax today because patient is leaving for [facility] tomorrow.  If not patient will call back to see if request can be sent to Dr. Asa Lente.

## 2015-04-29 NOTE — Telephone Encounter (Signed)
Tammy in Pulmonary can not do this.  Do you think there is anyone who could do this for the patient?  Please follow up with the patient at [phone].  Patient would need script sent before 4pm.

## 2015-04-29 NOTE — Telephone Encounter (Signed)
Left message advising patient that this order can be faxed to number patient listed on Monday after dr Asa Lente returns to sign order---also advised that we do require 24-48 hours to complete order requests

## 2015-05-11 DIAGNOSIS — M9903 Segmental and somatic dysfunction of lumbar region: Secondary | ICD-10-CM | POA: Diagnosis not present

## 2015-05-11 DIAGNOSIS — M9901 Segmental and somatic dysfunction of cervical region: Secondary | ICD-10-CM | POA: Diagnosis not present

## 2015-05-11 DIAGNOSIS — M47816 Spondylosis without myelopathy or radiculopathy, lumbar region: Secondary | ICD-10-CM | POA: Diagnosis not present

## 2015-05-11 DIAGNOSIS — M47812 Spondylosis without myelopathy or radiculopathy, cervical region: Secondary | ICD-10-CM | POA: Diagnosis not present

## 2015-05-17 NOTE — Telephone Encounter (Signed)
Pt called and wanted to check on this request?  Do you know if there has been a script sent for these or what the status is?    Best number [phone]

## 2015-05-17 NOTE — Telephone Encounter (Signed)
Per stefannie, dr Asa Lente needs to sign paperwork and then we can fax---patient has been notified

## 2015-05-18 ENCOUNTER — Encounter: Payer: Self-pay | Admitting: Cardiology

## 2015-05-18 DIAGNOSIS — M9901 Segmental and somatic dysfunction of cervical region: Secondary | ICD-10-CM | POA: Diagnosis not present

## 2015-05-18 DIAGNOSIS — M47812 Spondylosis without myelopathy or radiculopathy, cervical region: Secondary | ICD-10-CM | POA: Diagnosis not present

## 2015-05-18 DIAGNOSIS — M47816 Spondylosis without myelopathy or radiculopathy, lumbar region: Secondary | ICD-10-CM | POA: Diagnosis not present

## 2015-05-18 DIAGNOSIS — M9903 Segmental and somatic dysfunction of lumbar region: Secondary | ICD-10-CM | POA: Diagnosis not present

## 2015-05-19 ENCOUNTER — Ambulatory Visit
Admission: RE | Admit: 2015-05-19 | Discharge: 2015-05-19 | Disposition: A | Discharge status: Home / Self Care | Payer: Medicare Other | Source: Ambulatory Visit

## 2015-05-19 DIAGNOSIS — Z1231 Encounter for screening mammogram for malignant neoplasm of breast: Secondary | ICD-10-CM | POA: Diagnosis not present

## 2015-05-25 DIAGNOSIS — M47812 Spondylosis without myelopathy or radiculopathy, cervical region: Secondary | ICD-10-CM | POA: Diagnosis not present

## 2015-05-25 DIAGNOSIS — M47816 Spondylosis without myelopathy or radiculopathy, lumbar region: Secondary | ICD-10-CM | POA: Diagnosis not present

## 2015-05-25 DIAGNOSIS — M9901 Segmental and somatic dysfunction of cervical region: Secondary | ICD-10-CM | POA: Diagnosis not present

## 2015-05-25 DIAGNOSIS — M9903 Segmental and somatic dysfunction of lumbar region: Secondary | ICD-10-CM | POA: Diagnosis not present

## 2015-05-25 NOTE — Telephone Encounter (Signed)
RECAP:   PCP signed rx per pt request.  [address] [place], [place]) needed a specific form and OV notes in order to fill.  The requested information was faxed back to [facility]. [facility] sent response stating that the OV notes were not adequate enough to cover.  Dustin contacted pt and scheduled OV for that specific reason.

## 2015-06-01 ENCOUNTER — Ambulatory Visit: Payer: Medicare Other | Admitting: Internal Medicine

## 2015-06-01 DIAGNOSIS — M9903 Segmental and somatic dysfunction of lumbar region: Secondary | ICD-10-CM | POA: Diagnosis not present

## 2015-06-01 DIAGNOSIS — M9901 Segmental and somatic dysfunction of cervical region: Secondary | ICD-10-CM | POA: Diagnosis not present

## 2015-06-01 DIAGNOSIS — H2512 Age-related nuclear cataract, left eye: Secondary | ICD-10-CM | POA: Diagnosis not present

## 2015-06-01 DIAGNOSIS — M47812 Spondylosis without myelopathy or radiculopathy, cervical region: Secondary | ICD-10-CM | POA: Diagnosis not present

## 2015-06-01 DIAGNOSIS — M47816 Spondylosis without myelopathy or radiculopathy, lumbar region: Secondary | ICD-10-CM | POA: Diagnosis not present

## 2015-06-02 ENCOUNTER — Ambulatory Visit (INDEPENDENT_AMBULATORY_CARE_PROVIDER_SITE_OTHER): Payer: Medicare Other | Admitting: Internal Medicine

## 2015-06-02 ENCOUNTER — Encounter: Payer: Self-pay | Admitting: Internal Medicine

## 2015-06-02 VITALS — BP 122/78 | HR 90 | Temp 98.7°F | Ht 63.0 in | Wt 143.0 lb

## 2015-06-02 DIAGNOSIS — I10 Essential (primary) hypertension: Secondary | ICD-10-CM

## 2015-06-02 DIAGNOSIS — Z853 Personal history of malignant neoplasm of breast: Secondary | ICD-10-CM

## 2015-06-02 DIAGNOSIS — N6489 Other specified disorders of breast: Secondary | ICD-10-CM

## 2015-06-02 NOTE — Progress Notes (Signed)
Subjective:    Patient ID: Dorothy Lee, female    DOB: June 30, 1941, 74 y.o.   MRN: [id number]  HPI  Here to fu with  Hx of right breast cancer approx 1985 without recurrence, s/p right lumpectomy with XRT tx (no Chemotherapy), not required to take aromatase inhibitor since; still with surgical and XRT scarring significant with major asymmetry with smaller right breast , with normal left breast size. No other surgury since then.  Requires special right breast prosthesis and Bra to correct deformity.  No hx of plastic surgury or reconstructive. Needs medical documentation to confim need for chronic prosthesis need.  Last mammogram May 24, 2015 - neg for recurrent malignancy  Past Medical History  Diagnosis Date  . History of breast cancer     right - s/p XRT 1984, surg, no chemo  . Hyperlipidemia   . GERD (gastroesophageal reflux disease)   . Urinary incontinence   . Scoliosis     with mild chronic LBP  . Allergic rhinitis   . ALLERGIC RHINITIS   . Cough     chronic  . HYPERTENSION, BORDERLINE   . ROTATOR CUFF SYNDROME, LEFT   . Diverticulosis   . Glaucoma   . CAD (coronary artery disease)     DES RCA and LAD 02/2008, cath 11/2012 patent RCA and LAD stents with new mild LAD stenosis s/p DES   Past Surgical History  Procedure Laterality Date  . Appendectomy  1981  . Coronary angioplasty with stent placement  02/2008  . Breast biopsy  1984  . Breast lumpectomy  1982    right breast  . Left heart catheterization with coronary angiogram N/A 12/02/2012    Procedure: LEFT HEART CATHETERIZATION WITH CORONARY ANGIOGRAM;  Surgeon: Jettie Booze, MD;  Location: [facility] CATH LAB;  Service: Cardiovascular;  Laterality: N/A;  . Percutaneous coronary stent intervention (pci-s)  12/02/2012    Procedure: PERCUTANEOUS CORONARY STENT INTERVENTION (PCI-S);  Surgeon: Jettie Booze, MD;  Location: [facility] CATH LAB;  Service: Cardiovascular;;  DES to the MID LAD    reports that she has never smoked.  She has never used smokeless tobacco. She reports that she does not drink alcohol or use illicit drugs. family history includes Emphysema in her brother and father; Heart disease in her brother; Lung cancer in her brother; Throat cancer in her brother. Allergies  Allergen Reactions  . Crestor [Rosuvastatin]     Crestor 20 mg caused muscle aches - pt is able to tolerate lower doses (takes 10mg )  . Lipitor [Atorvastatin]     Atorvastatin 40 mg caused fatigue  . Lisinopril Cough  . Simvastatin     Muscle aches   Current Outpatient Prescriptions on File Prior to Visit  Medication Sig Dispense Refill  . aspirin EC 81 MG tablet Take 81 mg by mouth every morning.    . cetirizine (ZYRTEC) 10 MG tablet Take 10 mg by mouth at bedtime.    . clopidogrel (PLAVIX) 75 MG tablet Take 75 mg by mouth every morning.     Marland Kitchen CRESTOR 10 MG tablet TAKE ONE TABLET BY MOUTH AT BEDTIME 30 tablet 11  . latanoprost (XALATAN) 0.005 % ophthalmic solution Place 2 drops into both eyes at bedtime.    . nitroGLYCERIN (NITROSTAT) 0.4 MG SL tablet Place 0.4 mg under the tongue every 5 (five) minutes x 3 doses as needed for chest pain. May repeat x3    . ranitidine (ZANTAC) 150 MG tablet Take 150 mg by mouth  at bedtime.    Marland Kitchen amoxicillin-clavulanate (AUGMENTIN) 875-125 MG per tablet Take 1 tablet by mouth every 12 (twelve) hours. Take with meal (Patient not taking: Reported on 06/02/2015) 20 tablet 0  . fluticasone (FLONASE) 50 MCG/ACT nasal spray Place 1 spray into both nostrils 2 (two) times daily.    Marland Kitchen HYDROcodone-homatropine (HYCODAN) 5-1.5 MG/5ML syrup Take 5 mLs by mouth every 8 (eight) hours as needed for cough. (Patient not taking: Reported on 06/02/2015) 120 mL 0  . predniSONE (DELTASONE) 20 MG tablet Take 1 tablet (20 mg total) by mouth 2 (two) times daily. (Patient not taking: Reported on 06/02/2015) 14 tablet 0   No current facility-administered medications on file prior to visit.      Review of Systems All  otherwise neg per pt     Objective:   Physical Exam BP 122/78 mmHg  Pulse 90  Temp(Src) 98.7 F (37.1 C) (Oral)  Ht 5\' 3"  (1.6 m)  Wt 143 lb (64.864 kg)  BMI 25.34 kg/m2  SpO2 95% VS noted,  Constitutional: Pt appears in no significant distress HENT: Head: NCAT.  Right Ear: External ear normal.  Left Ear: External ear normal.  Eyes: . Pupils are equal, round, and reactive to light. Conjunctivae and EOM are normal Breast right - 1/3 or less size compared to left, with marked scarring at surgical site with deformity Left breast: normal size and appearance, no skin change or scarring, nipple without d/c, no mass Neck: Normal range of motion. Neck supple.  Cardiovascular: Normal rate and regular rhythm.   Pulmonary/Chest: Effort normal and breath sounds without rales or wheezing.  Neurological: Pt is alert. Not confused , motor grossly intact Skin: Skin is warm. No rash, no LE edema Psychiatric: Pt behavior is normal. No agitation.     Assessment & Plan:

## 2015-06-02 NOTE — Assessment & Plan Note (Signed)
stable overall by history and exam, recent data reviewed with pt, and pt to continue medical treatment as before,  to f/u any worsening symptoms or concerns BP Readings from Last 3 Encounters:  06/02/15 122/78  01/19/15 130/84  11/08/14 134/80

## 2015-06-02 NOTE — Assessment & Plan Note (Signed)
Remote, no recurrence, cont to follow with yearly mammogram

## 2015-06-02 NOTE — Patient Instructions (Signed)
You do qualify for a medically appropriate Right breast Prosthesis and Bra.  Your information will be forwarded.  Please continue all other medications as before, and refills have been done if requested.  Please have the pharmacy call with any other refills you may need.  Please continue your efforts at being more active, low cholesterol diet, and weight control.  Please keep your appointments with your specialists as you may have planned

## 2015-06-02 NOTE — Progress Notes (Signed)
Pre visit review using our clinic review tool, if applicable. No additional management support is needed unless otherwise documented below in the visit note. 

## 2015-06-02 NOTE — Assessment & Plan Note (Signed)
Has marked chronic post surgical/post XRT right breast deformity with small size, scarring without mass or other significant abnormality; no prior reconstructive surgury, pt does qualify for Right breast prosthesis and Bra as per pt request due to severe asymmetry, will forward this information to appropriate provider.

## 2015-06-09 DIAGNOSIS — Z961 Presence of intraocular lens: Secondary | ICD-10-CM | POA: Diagnosis not present

## 2015-06-09 DIAGNOSIS — Z9841 Cataract extraction status, right eye: Secondary | ICD-10-CM | POA: Diagnosis not present

## 2015-06-09 DIAGNOSIS — H2512 Age-related nuclear cataract, left eye: Secondary | ICD-10-CM | POA: Diagnosis not present

## 2015-06-09 DIAGNOSIS — H4011X0 Primary open-angle glaucoma, stage unspecified: Secondary | ICD-10-CM | POA: Diagnosis not present

## 2015-06-09 DIAGNOSIS — G43819 Other migraine, intractable, without status migrainosus: Secondary | ICD-10-CM | POA: Diagnosis not present

## 2015-06-17 DIAGNOSIS — J342 Deviated nasal septum: Secondary | ICD-10-CM | POA: Diagnosis not present

## 2015-06-17 DIAGNOSIS — J3081 Allergic rhinitis due to animal (cat) (dog) hair and dander: Secondary | ICD-10-CM | POA: Diagnosis not present

## 2015-06-17 DIAGNOSIS — R0683 Snoring: Secondary | ICD-10-CM | POA: Diagnosis not present

## 2015-06-17 DIAGNOSIS — J322 Chronic ethmoidal sinusitis: Secondary | ICD-10-CM | POA: Diagnosis not present

## 2015-06-21 DIAGNOSIS — J322 Chronic ethmoidal sinusitis: Secondary | ICD-10-CM | POA: Diagnosis not present

## 2015-06-21 DIAGNOSIS — J342 Deviated nasal septum: Secondary | ICD-10-CM | POA: Diagnosis not present

## 2015-06-21 DIAGNOSIS — R0683 Snoring: Secondary | ICD-10-CM | POA: Diagnosis not present

## 2015-06-22 DIAGNOSIS — M47816 Spondylosis without myelopathy or radiculopathy, lumbar region: Secondary | ICD-10-CM | POA: Diagnosis not present

## 2015-06-22 DIAGNOSIS — M9903 Segmental and somatic dysfunction of lumbar region: Secondary | ICD-10-CM | POA: Diagnosis not present

## 2015-06-22 DIAGNOSIS — M9901 Segmental and somatic dysfunction of cervical region: Secondary | ICD-10-CM | POA: Diagnosis not present

## 2015-06-22 DIAGNOSIS — M47812 Spondylosis without myelopathy or radiculopathy, cervical region: Secondary | ICD-10-CM | POA: Diagnosis not present

## 2015-06-29 DIAGNOSIS — M47816 Spondylosis without myelopathy or radiculopathy, lumbar region: Secondary | ICD-10-CM | POA: Diagnosis not present

## 2015-06-29 DIAGNOSIS — M9903 Segmental and somatic dysfunction of lumbar region: Secondary | ICD-10-CM | POA: Diagnosis not present

## 2015-06-29 DIAGNOSIS — M9901 Segmental and somatic dysfunction of cervical region: Secondary | ICD-10-CM | POA: Diagnosis not present

## 2015-06-29 DIAGNOSIS — M47812 Spondylosis without myelopathy or radiculopathy, cervical region: Secondary | ICD-10-CM | POA: Diagnosis not present

## 2015-07-06 DIAGNOSIS — M47816 Spondylosis without myelopathy or radiculopathy, lumbar region: Secondary | ICD-10-CM | POA: Diagnosis not present

## 2015-07-06 DIAGNOSIS — M9901 Segmental and somatic dysfunction of cervical region: Secondary | ICD-10-CM | POA: Diagnosis not present

## 2015-07-06 DIAGNOSIS — M47812 Spondylosis without myelopathy or radiculopathy, cervical region: Secondary | ICD-10-CM | POA: Diagnosis not present

## 2015-07-06 DIAGNOSIS — M9903 Segmental and somatic dysfunction of lumbar region: Secondary | ICD-10-CM | POA: Diagnosis not present

## 2015-07-11 DIAGNOSIS — J40 Bronchitis, not specified as acute or chronic: Secondary | ICD-10-CM | POA: Diagnosis not present

## 2015-07-11 DIAGNOSIS — J342 Deviated nasal septum: Secondary | ICD-10-CM | POA: Diagnosis not present

## 2015-07-11 DIAGNOSIS — K219 Gastro-esophageal reflux disease without esophagitis: Secondary | ICD-10-CM | POA: Diagnosis not present

## 2015-07-11 DIAGNOSIS — J324 Chronic pansinusitis: Secondary | ICD-10-CM | POA: Diagnosis not present

## 2015-07-13 DIAGNOSIS — M47812 Spondylosis without myelopathy or radiculopathy, cervical region: Secondary | ICD-10-CM | POA: Diagnosis not present

## 2015-07-13 DIAGNOSIS — M9901 Segmental and somatic dysfunction of cervical region: Secondary | ICD-10-CM | POA: Diagnosis not present

## 2015-07-13 DIAGNOSIS — M47816 Spondylosis without myelopathy or radiculopathy, lumbar region: Secondary | ICD-10-CM | POA: Diagnosis not present

## 2015-07-13 DIAGNOSIS — M9903 Segmental and somatic dysfunction of lumbar region: Secondary | ICD-10-CM | POA: Diagnosis not present

## 2015-07-22 ENCOUNTER — Other Ambulatory Visit: Payer: Self-pay | Admitting: Internal Medicine

## 2015-07-22 NOTE — Telephone Encounter (Signed)
Patient states her son past away three days ago.  She is requesting Dr. Asa Lente to send her something for her nerves.  Patient uses [facility] at [facility].

## 2015-07-24 NOTE — Telephone Encounter (Signed)
Ok to phone in pended rx for xanax 0.5 mg tabs TID prn anxiety #30, no refills

## 2015-07-25 MED ORDER — ALPRAZOLAM 0.5 MG PO TABS
0.5000 mg | ORAL_TABLET | Freq: Three times a day (TID) | ORAL | Status: DC | PRN
Start: 2015-07-25 — End: 2016-01-05

## 2015-07-25 NOTE — Telephone Encounter (Signed)
rx called in

## 2015-07-27 DIAGNOSIS — M47816 Spondylosis without myelopathy or radiculopathy, lumbar region: Secondary | ICD-10-CM | POA: Diagnosis not present

## 2015-07-27 DIAGNOSIS — M9903 Segmental and somatic dysfunction of lumbar region: Secondary | ICD-10-CM | POA: Diagnosis not present

## 2015-07-27 DIAGNOSIS — M47812 Spondylosis without myelopathy or radiculopathy, cervical region: Secondary | ICD-10-CM | POA: Diagnosis not present

## 2015-07-27 DIAGNOSIS — M9901 Segmental and somatic dysfunction of cervical region: Secondary | ICD-10-CM | POA: Diagnosis not present

## 2015-08-02 DIAGNOSIS — I2510 Atherosclerotic heart disease of native coronary artery without angina pectoris: Secondary | ICD-10-CM | POA: Diagnosis not present

## 2015-08-02 DIAGNOSIS — I208 Other forms of angina pectoris: Secondary | ICD-10-CM | POA: Diagnosis not present

## 2015-08-02 DIAGNOSIS — Z955 Presence of coronary angioplasty implant and graft: Secondary | ICD-10-CM | POA: Diagnosis not present

## 2015-08-02 DIAGNOSIS — R05 Cough: Secondary | ICD-10-CM | POA: Diagnosis not present

## 2015-08-03 DIAGNOSIS — M9901 Segmental and somatic dysfunction of cervical region: Secondary | ICD-10-CM | POA: Diagnosis not present

## 2015-08-03 DIAGNOSIS — M9903 Segmental and somatic dysfunction of lumbar region: Secondary | ICD-10-CM | POA: Diagnosis not present

## 2015-08-03 DIAGNOSIS — M47816 Spondylosis without myelopathy or radiculopathy, lumbar region: Secondary | ICD-10-CM | POA: Diagnosis not present

## 2015-08-03 DIAGNOSIS — J324 Chronic pansinusitis: Secondary | ICD-10-CM | POA: Diagnosis not present

## 2015-08-03 DIAGNOSIS — M47812 Spondylosis without myelopathy or radiculopathy, cervical region: Secondary | ICD-10-CM | POA: Diagnosis not present

## 2015-08-03 DIAGNOSIS — J40 Bronchitis, not specified as acute or chronic: Secondary | ICD-10-CM | POA: Diagnosis not present

## 2015-08-03 DIAGNOSIS — J342 Deviated nasal septum: Secondary | ICD-10-CM | POA: Diagnosis not present

## 2015-08-10 DIAGNOSIS — M47812 Spondylosis without myelopathy or radiculopathy, cervical region: Secondary | ICD-10-CM | POA: Diagnosis not present

## 2015-08-10 DIAGNOSIS — M9901 Segmental and somatic dysfunction of cervical region: Secondary | ICD-10-CM | POA: Diagnosis not present

## 2015-08-10 DIAGNOSIS — M47816 Spondylosis without myelopathy or radiculopathy, lumbar region: Secondary | ICD-10-CM | POA: Diagnosis not present

## 2015-08-10 DIAGNOSIS — M9903 Segmental and somatic dysfunction of lumbar region: Secondary | ICD-10-CM | POA: Diagnosis not present

## 2015-08-15 DIAGNOSIS — Z23 Encounter for immunization: Secondary | ICD-10-CM | POA: Diagnosis not present

## 2015-08-17 DIAGNOSIS — M47816 Spondylosis without myelopathy or radiculopathy, lumbar region: Secondary | ICD-10-CM | POA: Diagnosis not present

## 2015-08-17 DIAGNOSIS — M9901 Segmental and somatic dysfunction of cervical region: Secondary | ICD-10-CM | POA: Diagnosis not present

## 2015-08-17 DIAGNOSIS — M47812 Spondylosis without myelopathy or radiculopathy, cervical region: Secondary | ICD-10-CM | POA: Diagnosis not present

## 2015-08-17 DIAGNOSIS — M9903 Segmental and somatic dysfunction of lumbar region: Secondary | ICD-10-CM | POA: Diagnosis not present

## 2015-08-30 DIAGNOSIS — E7800 Pure hypercholesterolemia, unspecified: Secondary | ICD-10-CM | POA: Diagnosis not present

## 2015-08-30 DIAGNOSIS — I2510 Atherosclerotic heart disease of native coronary artery without angina pectoris: Secondary | ICD-10-CM | POA: Diagnosis not present

## 2015-09-09 DIAGNOSIS — J342 Deviated nasal septum: Secondary | ICD-10-CM | POA: Diagnosis not present

## 2015-09-09 DIAGNOSIS — J323 Chronic sphenoidal sinusitis: Secondary | ICD-10-CM | POA: Diagnosis not present

## 2015-09-09 DIAGNOSIS — J320 Chronic maxillary sinusitis: Secondary | ICD-10-CM | POA: Diagnosis not present

## 2015-09-09 DIAGNOSIS — J322 Chronic ethmoidal sinusitis: Secondary | ICD-10-CM | POA: Diagnosis not present

## 2015-09-09 DIAGNOSIS — K219 Gastro-esophageal reflux disease without esophagitis: Secondary | ICD-10-CM | POA: Diagnosis not present

## 2015-09-09 DIAGNOSIS — E7800 Pure hypercholesterolemia, unspecified: Secondary | ICD-10-CM | POA: Diagnosis not present

## 2015-09-09 DIAGNOSIS — J321 Chronic frontal sinusitis: Secondary | ICD-10-CM | POA: Diagnosis not present

## 2015-09-09 DIAGNOSIS — Z853 Personal history of malignant neoplasm of breast: Secondary | ICD-10-CM | POA: Diagnosis not present

## 2015-09-09 DIAGNOSIS — I2510 Atherosclerotic heart disease of native coronary artery without angina pectoris: Secondary | ICD-10-CM | POA: Diagnosis not present

## 2015-09-15 DIAGNOSIS — J321 Chronic frontal sinusitis: Secondary | ICD-10-CM | POA: Diagnosis not present

## 2015-09-15 DIAGNOSIS — J320 Chronic maxillary sinusitis: Secondary | ICD-10-CM | POA: Diagnosis not present

## 2015-09-15 DIAGNOSIS — J322 Chronic ethmoidal sinusitis: Secondary | ICD-10-CM | POA: Diagnosis not present

## 2015-09-15 DIAGNOSIS — J323 Chronic sphenoidal sinusitis: Secondary | ICD-10-CM | POA: Diagnosis not present

## 2015-09-15 DIAGNOSIS — J342 Deviated nasal septum: Secondary | ICD-10-CM | POA: Diagnosis not present

## 2015-09-15 DIAGNOSIS — E7800 Pure hypercholesterolemia, unspecified: Secondary | ICD-10-CM | POA: Diagnosis not present

## 2015-09-15 DIAGNOSIS — J329 Chronic sinusitis, unspecified: Secondary | ICD-10-CM | POA: Diagnosis not present

## 2015-09-30 DIAGNOSIS — J324 Chronic pansinusitis: Secondary | ICD-10-CM | POA: Diagnosis not present

## 2015-09-30 DIAGNOSIS — J342 Deviated nasal septum: Secondary | ICD-10-CM | POA: Diagnosis not present

## 2015-10-03 DIAGNOSIS — L509 Urticaria, unspecified: Secondary | ICD-10-CM | POA: Diagnosis not present

## 2015-10-03 DIAGNOSIS — L259 Unspecified contact dermatitis, unspecified cause: Secondary | ICD-10-CM | POA: Diagnosis not present

## 2015-10-05 DIAGNOSIS — L509 Urticaria, unspecified: Secondary | ICD-10-CM | POA: Diagnosis not present

## 2015-10-26 DIAGNOSIS — E7800 Pure hypercholesterolemia, unspecified: Secondary | ICD-10-CM | POA: Diagnosis not present

## 2015-10-26 DIAGNOSIS — I2510 Atherosclerotic heart disease of native coronary artery without angina pectoris: Secondary | ICD-10-CM | POA: Diagnosis not present

## 2015-10-26 DIAGNOSIS — I10 Essential (primary) hypertension: Secondary | ICD-10-CM | POA: Diagnosis not present

## 2015-10-31 DIAGNOSIS — H538 Other visual disturbances: Secondary | ICD-10-CM | POA: Diagnosis not present

## 2015-10-31 DIAGNOSIS — H401132 Primary open-angle glaucoma, bilateral, moderate stage: Secondary | ICD-10-CM | POA: Diagnosis not present

## 2015-11-04 DIAGNOSIS — R079 Chest pain, unspecified: Secondary | ICD-10-CM | POA: Diagnosis not present

## 2015-11-04 DIAGNOSIS — I2510 Atherosclerotic heart disease of native coronary artery without angina pectoris: Secondary | ICD-10-CM | POA: Diagnosis not present

## 2015-11-04 DIAGNOSIS — R0600 Dyspnea, unspecified: Secondary | ICD-10-CM | POA: Diagnosis not present

## 2015-11-11 DIAGNOSIS — H401121 Primary open-angle glaucoma, left eye, mild stage: Secondary | ICD-10-CM | POA: Diagnosis not present

## 2015-11-11 DIAGNOSIS — H401112 Primary open-angle glaucoma, right eye, moderate stage: Secondary | ICD-10-CM | POA: Diagnosis not present

## 2015-11-25 DIAGNOSIS — J320 Chronic maxillary sinusitis: Secondary | ICD-10-CM | POA: Diagnosis not present

## 2015-11-30 ENCOUNTER — Encounter (HOSPITAL_COMMUNITY): Payer: Self-pay | Admitting: Emergency Medicine

## 2015-11-30 ENCOUNTER — Emergency Department (HOSPITAL_COMMUNITY): Payer: Medicare Other

## 2015-11-30 ENCOUNTER — Emergency Department (HOSPITAL_COMMUNITY)
Admission: EM | Admit: 2015-11-30 | Discharge: 2015-11-30 | Disposition: A | Discharge status: Home / Self Care | Payer: Medicare Other | Attending: Emergency Medicine | Admitting: Emergency Medicine

## 2015-11-30 DIAGNOSIS — Z8679 Personal history of other diseases of the circulatory system: Secondary | ICD-10-CM | POA: Diagnosis not present

## 2015-11-30 DIAGNOSIS — E785 Hyperlipidemia, unspecified: Secondary | ICD-10-CM | POA: Insufficient documentation

## 2015-11-30 DIAGNOSIS — Z8739 Personal history of other diseases of the musculoskeletal system and connective tissue: Secondary | ICD-10-CM | POA: Insufficient documentation

## 2015-11-30 DIAGNOSIS — Z853 Personal history of malignant neoplasm of breast: Secondary | ICD-10-CM | POA: Insufficient documentation

## 2015-11-30 DIAGNOSIS — Z87448 Personal history of other diseases of urinary system: Secondary | ICD-10-CM | POA: Diagnosis not present

## 2015-11-30 DIAGNOSIS — H409 Unspecified glaucoma: Secondary | ICD-10-CM | POA: Diagnosis not present

## 2015-11-30 DIAGNOSIS — K5732 Diverticulitis of large intestine without perforation or abscess without bleeding: Secondary | ICD-10-CM | POA: Diagnosis not present

## 2015-11-30 DIAGNOSIS — I2510 Atherosclerotic heart disease of native coronary artery without angina pectoris: Secondary | ICD-10-CM | POA: Insufficient documentation

## 2015-11-30 DIAGNOSIS — R1032 Left lower quadrant pain: Secondary | ICD-10-CM | POA: Diagnosis present

## 2015-11-30 DIAGNOSIS — Z9049 Acquired absence of other specified parts of digestive tract: Secondary | ICD-10-CM | POA: Insufficient documentation

## 2015-11-30 LAB — URINE MICROSCOPIC-ADD ON: RBC / HPF: NONE SEEN RBC/hpf (ref 0–5)

## 2015-11-30 LAB — CBC WITH DIFFERENTIAL/PLATELET
Basophils Absolute: 0 10*3/uL (ref 0.0–0.1)
Basophils Relative: 0 %
EOS PCT: 2 %
Eosinophils Absolute: 0.2 10*3/uL (ref 0.0–0.7)
HCT: 40.2 % (ref 36.0–46.0)
Hemoglobin: 13 g/dL (ref 12.0–15.0)
LYMPHS ABS: 1.6 10*3/uL (ref 0.7–4.0)
Lymphocytes Relative: 13 %
MCH: 29.8 pg (ref 26.0–34.0)
MCHC: 32.3 g/dL (ref 30.0–36.0)
MCV: 92.2 fL (ref 78.0–100.0)
Monocytes Absolute: 1.6 10*3/uL — ABNORMAL HIGH (ref 0.1–1.0)
Monocytes Relative: 13 %
Neutro Abs: 8.5 10*3/uL — ABNORMAL HIGH (ref 1.7–7.7)
Neutrophils Relative %: 72 %
Platelets: 340 10*3/uL (ref 150–400)
RBC: 4.36 MIL/uL (ref 3.87–5.11)
RDW: 12.7 % (ref 11.5–15.5)
WBC: 11.9 10*3/uL — ABNORMAL HIGH (ref 4.0–10.5)

## 2015-11-30 LAB — BASIC METABOLIC PANEL
Anion gap: 7 (ref 5–15)
BUN: 11 mg/dL (ref 6–20)
CO2: 31 mmol/L (ref 22–32)
Calcium: 9 mg/dL (ref 8.9–10.3)
Chloride: 103 mmol/L (ref 101–111)
Creatinine, Ser: 0.77 mg/dL (ref 0.44–1.00)
GFR calc Af Amer: >60 mL/min (ref >60)
GFR calc non Af Amer: >60 mL/min (ref >60)
Glucose, Bld: 85 mg/dL (ref 65–99)
Potassium: 4 mmol/L (ref 3.5–5.1)
Sodium: 141 mmol/L (ref 135–145)

## 2015-11-30 LAB — URINALYSIS, ROUTINE W REFLEX MICROSCOPIC
Bilirubin Urine: NEGATIVE
Glucose, UA: NEGATIVE mg/dL
Hgb urine dipstick: NEGATIVE
Leukocytes, UA: NEGATIVE
Nitrite: NEGATIVE
Specific Gravity, Urine: 1.02 (ref 1.005–1.030)
pH: 6 (ref 5.0–8.0)

## 2015-11-30 MED ORDER — CIPROFLOXACIN IN D5W 400 MG/200ML IV SOLN
400.0000 mg | Freq: Once | INTRAVENOUS | Status: AC
  Administered 2015-11-30: 400 mg via INTRAVENOUS
  Filled 2015-11-30: qty 200

## 2015-11-30 MED ORDER — ONDANSETRON HCL 4 MG/2ML IJ SOLN
4.0000 mg | Freq: Once | INTRAMUSCULAR | Status: AC
  Administered 2015-11-30: 4 mg via INTRAVENOUS
  Filled 2015-11-30: qty 2

## 2015-11-30 MED ORDER — SODIUM CHLORIDE 0.9 % IV BOLUS (SEPSIS)
500.0000 mL | Freq: Once | INTRAVENOUS | Status: AC
  Administered 2015-11-30: 500 mL via INTRAVENOUS

## 2015-11-30 MED ORDER — KETOROLAC TROMETHAMINE 30 MG/ML IJ SOLN
30.0000 mg | Freq: Once | INTRAMUSCULAR | Status: AC
Start: 2015-11-30 — End: 2015-11-30
  Administered 2015-11-30: 30 mg via INTRAVENOUS
  Filled 2015-11-30: qty 1

## 2015-11-30 MED ORDER — IOHEXOL 300 MG/ML  SOLN
100.0000 mL | Freq: Once | INTRAMUSCULAR | Status: AC | PRN
  Administered 2015-11-30: 100 mL via INTRAVENOUS

## 2015-11-30 MED ORDER — METRONIDAZOLE IN NACL 5-0.79 MG/ML-% IV SOLN
500.0000 mg | Freq: Once | INTRAVENOUS | Status: AC
  Administered 2015-11-30: 500 mg via INTRAVENOUS
  Filled 2015-11-30: qty 100

## 2015-11-30 MED ORDER — ONDANSETRON HCL 8 MG PO TABS: 8.0000 mg | ORAL_TABLET | ORAL | Status: DC | PRN

## 2015-11-30 MED ORDER — METRONIDAZOLE 500 MG PO TABS: 500.0000 mg | ORAL_TABLET | Freq: Three times a day (TID) | ORAL | Status: DC

## 2015-11-30 MED ORDER — DIATRIZOATE MEGLUMINE & SODIUM 66-10 % PO SOLN
ORAL | Status: AC
  Filled 2015-11-30: qty 60

## 2015-11-30 MED ORDER — CIPROFLOXACIN HCL 500 MG PO TABS: 500.0000 mg | ORAL_TABLET | Freq: Two times a day (BID) | ORAL | Status: DC

## 2015-11-30 MED ORDER — OXYCODONE-ACETAMINOPHEN 5-325 MG PO TABS: 1.0000 | ORAL_TABLET | ORAL | Status: DC | PRN

## 2015-11-30 NOTE — ED Notes (Signed)
Pt c/o left flank pain radiating into left lower abdomen x 3 days. Pt also reports polyuria. Denies n/v. nad noted.

## 2015-11-30 NOTE — Discharge Instructions (Signed)
Diverticulitis Diverticulitis is when small pockets that have formed in your colon (large intestine) become infected or swollen. HOME CARE  Follow your doctor's instructions.  Follow a special diet if told by your doctor.  When you feel better, your doctor may tell you to change your diet. You may be told to eat a lot of fiber. Fruits and vegetables are good sources of fiber. Fiber makes it easier to poop (have bowel movements).  Take supplements or probiotics as told by your doctor.  Only take medicines as told by your doctor.  Keep all follow-up visits with your doctor. GET HELP IF:  Your pain does not get better.  You have a hard time eating food.  You are not pooping like normal. GET HELP RIGHT AWAY IF:  Your pain gets worse.  Your problems do not get better.  Your problems suddenly get worse.  You have a fever.  You keep throwing up (vomiting).  You have bloody or black, tarry poop (stool). MAKE SURE YOU:   Understand these instructions.  Will watch your condition.  Will get help right away if you are not doing well or get worse.   This information is not intended to replace advice given to you by your health care provider. Make sure you discuss any questions you have with your health care provider.   Document Released: 03/12/2008 Document Revised: 09/29/2013 Document Reviewed: 08/19/2013 Elsevier Interactive Patient Education 2016 [place] have inflammation in your large intestine called diverticulitis. Prescription for 2 antibiotics, pain medicine, nausea medicine. Increase fluids. Rest. Return if symptoms worsen, especially fever, dehydration, worsening pain.   Additionally, you have a 4 mm nodule in your left lower lobe lung. This can be followed up by your primary care doctor.

## 2015-11-30 NOTE — ED Notes (Signed)
Patient with no complaints at this time. Respirations even and unlabored. Skin warm/dry. Discharge instructions reviewed with patient at this time. Patient given opportunity to voice concerns/ask questions. IV removed per policy and band-aid applied to site. Patient discharged at this time and left Emergency Department with steady gait.  

## 2015-11-30 NOTE — ED Provider Notes (Signed)
CSN: [id number]     Arrival date & time 11/30/15  [id number] History  By signing my name below, I, Emmanuella Mensah, attest that this documentation has been prepared under the direction and in the presence of Nat Christen, MD. Electronically Signed: Judithann Sauger, ED Scribe. 11/30/2015. 9:23 AM.    Chief Complaint  Patient presents with  . Flank Pain   The history is provided by the patient. No language interpreter was used.   HPI Comments: Dorothy Lee is a 75 y.o. female who presents to the Emergency Department complaining of gradually worsening moderate LLQ pain radiating to left flank onset 3 days ago. She explains that her pain became worse in the middle of the night. She reports associated urinary frequency. Pt has tried Advil with minimal relief. She states that this is atypical for her. No hematuria, dysuria, fever or chills.    Past Medical History  Diagnosis Date  . History of breast cancer     right - s/p XRT 1984, surg, no chemo  . Hyperlipidemia   . GERD (gastroesophageal reflux disease)   . Urinary incontinence   . Scoliosis     with mild chronic LBP  . Allergic rhinitis   . ALLERGIC RHINITIS   . Cough     chronic  . HYPERTENSION, BORDERLINE   . ROTATOR CUFF SYNDROME, LEFT   . Diverticulosis   . Glaucoma   . CAD (coronary artery disease)     DES RCA and LAD 02/2008, cath 11/2012 patent RCA and LAD stents with new mild LAD stenosis s/p DES   Past Surgical History  Procedure Laterality Date  . Appendectomy  1981  . Coronary angioplasty with stent placement  02/2008  . Breast biopsy  1984  . Breast lumpectomy  1982    right breast  . Left heart catheterization with coronary angiogram N/A 12/02/2012    Procedure: LEFT HEART CATHETERIZATION WITH CORONARY ANGIOGRAM;  Surgeon: Jettie Booze, MD;  Location: [facility] CATH LAB;  Service: Cardiovascular;  Laterality: N/A;  . Percutaneous coronary stent intervention (pci-s)  12/02/2012    Procedure: PERCUTANEOUS CORONARY STENT  INTERVENTION (PCI-S);  Surgeon: Jettie Booze, MD;  Location: [facility] CATH LAB;  Service: Cardiovascular;;  DES to the MID LAD  . Nasal sinus surgery     Family History  Problem Relation Age of Onset  . Emphysema Father   . Emphysema Brother   . Heart disease Brother     x3 brothers  . Lung cancer Brother   . Throat cancer Brother    Social History  Substance Use Topics  . Smoking status: Never Smoker   . Smokeless tobacco: Never Used     Comment: Widowed, lives alone but has sig other.   . Alcohol Use: No     Comment: social alcohol > wine 2x week   OB History    No data available     Review of Systems  Constitutional: Negative for fever and chills.  Gastrointestinal: Positive for abdominal pain.  Genitourinary: Positive for frequency and flank pain. Negative for dysuria and hematuria.    A complete 10 system review of systems was obtained and all systems are negative except as noted in the HPI and PMH.    Allergies  Crestor; Lipitor; Lisinopril; and Simvastatin  Home Medications   Prior to Admission medications   Medication Sig Start Date End Date Taking? Authorizing Provider  aspirin EC 81 MG tablet Take 81 mg by mouth every morning.   Yes  Historical Provider, MD  CRESTOR 10 MG tablet TAKE ONE TABLET BY MOUTH AT BEDTIME 09/01/14  Yes Sueanne Margarita, MD  latanoprost (XALATAN) 0.005 % ophthalmic solution Place 2 drops into both eyes at bedtime. 07/28/14  Yes Historical Provider, MD  pantoprazole (PROTONIX) 40 MG tablet Take 40 mg by mouth daily.   Yes Historical Provider, MD  ALPRAZolam Duanne Moron) 0.5 MG tablet Take 1 tablet (0.5 mg total) by mouth 3 (three) times daily as needed for anxiety. Patient not taking: Reported on 11/30/2015 07/25/15   Rowe Clack, MD  cetirizine (ZYRTEC) 10 MG tablet Take 10 mg by mouth at bedtime.    Historical Provider, MD  ciprofloxacin (CIPRO) 500 MG tablet Take 1 tablet (500 mg total) by mouth 2 (two) times daily. 11/30/15   Nat Christen, MD  metroNIDAZOLE (FLAGYL) 500 MG tablet Take 1 tablet (500 mg total) by mouth 3 (three) times daily. 11/30/15   Nat Christen, MD  nitroGLYCERIN (NITROSTAT) 0.4 MG SL tablet Place 0.4 mg under the tongue every 5 (five) minutes x 3 doses as needed for chest pain. May repeat x3    Historical Provider, MD  ondansetron (ZOFRAN) 8 MG tablet Take 1 tablet (8 mg total) by mouth every 4 (four) hours as needed. 11/30/15   Nat Christen, MD  oxyCODONE-acetaminophen (PERCOCET) 5-325 MG tablet Take 1-2 tablets by mouth every 4 (four) hours as needed. 11/30/15   Nat Christen, MD   BP 120/64 mmHg  Pulse 79  Temp(Src) 97.8 F (36.6 C) (Oral)  Resp 16  Ht 5\' 3"  (1.6 m)  Wt 135 lb (61.236 kg)  BMI 23.92 kg/m2  SpO2 93% Physical Exam  Constitutional: She is oriented to person, place, and time. She appears well-developed and well-nourished.  HENT:  Head: Normocephalic and atraumatic.  Eyes: Conjunctivae and EOM are normal. Pupils are equal, round, and reactive to light.  Neck: Normal range of motion. Neck supple.  Cardiovascular: Normal rate and regular rhythm.   Pulmonary/Chest: Effort normal and breath sounds normal.  Abdominal: Soft. Bowel sounds are normal.  Minimally tender in LLQ and left flank.   Musculoskeletal: Normal range of motion.  Neurological: She is alert and oriented to person, place, and time.  Skin: Skin is warm and dry.  Psychiatric: She has a normal mood and affect. Her behavior is normal.  Nursing note and vitals reviewed.   ED Course  Procedures (including critical care time) DIAGNOSTIC STUDIES: Oxygen Saturation is 97% on RA, normal by my interpretation.    COORDINATION OF CARE: 8:15 AM- Pt advised of plan for treatment and pt agrees. Pt will receive CT and lab work. She will also receive pain medication.    Labs Review Labs Reviewed  URINALYSIS, ROUTINE W REFLEX MICROSCOPIC (NOT AT [facility]) - Abnormal; Notable for the following:    Ketones, ur TRACE (*)    Protein, ur TRACE  (*)    All other components within normal limits  URINE MICROSCOPIC-ADD ON - Abnormal; Notable for the following:    Squamous Epithelial / LPF 0-5 (*)    Bacteria, UA RARE (*)    All other components within normal limits  CBC WITH DIFFERENTIAL/PLATELET - Abnormal; Notable for the following:    WBC 11.9 (*)    Neutro Abs 8.5 (*)    Monocytes Absolute 1.6 (*)    All other components within normal limits  BASIC METABOLIC PANEL    Imaging Review Ct Abdomen Pelvis W Contrast  11/30/2015  CLINICAL DATA:  75 year old  female with left flank pain radiating to the abdomen with polyuria. Initial encounter. EXAM: CT ABDOMEN AND PELVIS WITH CONTRAST TECHNIQUE: Multidetector CT imaging of the abdomen and pelvis was performed using the standard protocol following bolus administration of intravenous contrast. CONTRAST:  152mL OMNIPAQUE IOHEXOL 300 MG/ML  SOLN COMPARISON:  Lumbar radiographs 08/04/2009. FINDINGS: Scarring in the inferior right middle lobe. Scarring with some fat density in the posterior basal segment of the right lower lobe. Minimal scarring in the left lingula and posterior costophrenic angle. 4 mm left lower lobe lung nodule on series 7, image 13. No pericardial or pleural effusion. Osteopenia. Advanced lower lumbar spine degeneration. No acute osseous abnormality identified. No pelvic free fluid.  The rectum appears normal. There is an 8-10 cm segment of the sigmoid colon which is abnormally enlarged and indistinct with wall thickening and mild mesenteric stranding. This is inseparable from the left adnexa (see coronal image 43). There are numerous diverticula. The uterus an urinary bladder do not appear directly involved. Associated left pelvic sidewall/retroperitoneal stranding. No fluid collection. No extraluminal gas identified. Negative right adnexa. Diverticulosis continues in the left colon but there is no active inflammation. Mild diverticulosis in the transverse colon. Oral contrast has  reached the ascending colon. Appendix is diminutive or absent. Negative terminal ileum. No dilated or abnormal small bowel loops. Negative stomach and duodenum. No abdominal free air or free fluid. Liver, gallbladder, spleen, pancreas and adrenal glands are within normal limits. Portal venous system is patent. Bilateral renal enhancement and contrast excretion within normal limits. Extensive Aortoiliac calcified atherosclerosis noted. Major arterial structures in the abdomen and pelvis are patent. Calcified plaque continues into the proximal femoral arteries. No lymphadenopathy. IMPRESSION: 1. Acute sigmoid diverticulitis affecting an 8 to 10 cm segment of the bowel. Secondary involvement of the left adnexa, but no other complicating features. 2. No other acute findings in the abdomen or pelvis. Diverticulosis elsewhere. Extensive calcified aortic atherosclerosis. 3. Lung base scarring. 4 mm medial basal segment left lower lobe lung nodule. If the patient is at high risk for bronchogenic carcinoma, follow-up chest CT at 1 year is recommended. If the patient is at low risk, no follow-up is needed. This recommendation follows the consensus statement: Guidelines for Management of Small Pulmonary Nodules Detected on CT Scans: A Statement from the [employer] as published in Radiology 2005; 237:395-400. Electronically Signed   By: Genevie Ann M.D.   On: 11/30/2015 09:27     Nat Christen, MD has personally reviewed and evaluated these images and lab results as part of his medical decision-making.  MDM   Final diagnoses:  Diverticulitis of large intestine without perforation or abscess without bleeding   No acute abdomen. CT scan shows acute sigmoid diverticulitis. Rx IV Cipro, IV Flagyl.  Discharge medications oral Cipro, Flagyl, pain meds, nausea meds. Patient instructed return if worse.  I personally performed the services described in this documentation, which was scribed in my presence. The recorded  information has been reviewed and is accurate.     Nat Christen, MD 11/30/15 [phone]

## 2015-12-14 DIAGNOSIS — Z124 Encounter for screening for malignant neoplasm of cervix: Secondary | ICD-10-CM | POA: Diagnosis not present

## 2015-12-14 DIAGNOSIS — N3281 Overactive bladder: Secondary | ICD-10-CM | POA: Diagnosis not present

## 2015-12-21 ENCOUNTER — Telehealth: Payer: Self-pay | Admitting: Gastroenterology

## 2015-12-21 NOTE — Telephone Encounter (Signed)
I tried to call the patient no answer, lmom 

## 2015-12-21 NOTE — Telephone Encounter (Signed)
Pt is wanting to make an OV as a new patietn. Requesting RMR. Seen in the ED recently, no PCP. Please advise if we can approve for new patient. [phone]

## 2015-12-21 NOTE — Telephone Encounter (Signed)
She has been seen by Dr. Fuller Plan on numerous occasions. Is she wanting to transfer care here solely?

## 2015-12-21 NOTE — Telephone Encounter (Signed)
I spoke with the patient and she would like to transfer her GI care here to see Dr. Gala Romney.  She saw him at [facility] when she was there and was very impressed.  Routing to AS for approval

## 2015-12-22 ENCOUNTER — Encounter: Payer: Self-pay | Admitting: Internal Medicine

## 2015-12-22 NOTE — Telephone Encounter (Signed)
Noted. May schedule as new patient.

## 2015-12-22 NOTE — Telephone Encounter (Signed)
OV made and letter mailed °

## 2015-12-26 DIAGNOSIS — J320 Chronic maxillary sinusitis: Secondary | ICD-10-CM | POA: Diagnosis not present

## 2016-01-05 ENCOUNTER — Other Ambulatory Visit: Payer: Self-pay

## 2016-01-05 ENCOUNTER — Ambulatory Visit (INDEPENDENT_AMBULATORY_CARE_PROVIDER_SITE_OTHER): Payer: Medicare Other | Admitting: Gastroenterology

## 2016-01-05 ENCOUNTER — Encounter: Payer: Self-pay | Admitting: Gastroenterology

## 2016-01-05 VITALS — BP 142/77 | HR 91 | Temp 96.4°F | Ht 63.0 in | Wt 140.8 lb

## 2016-01-05 DIAGNOSIS — R1310 Dysphagia, unspecified: Secondary | ICD-10-CM

## 2016-01-05 DIAGNOSIS — R1319 Other dysphagia: Secondary | ICD-10-CM | POA: Insufficient documentation

## 2016-01-05 DIAGNOSIS — R1314 Dysphagia, pharyngoesophageal phase: Secondary | ICD-10-CM

## 2016-01-05 DIAGNOSIS — K5792 Diverticulitis of intestine, part unspecified, without perforation or abscess without bleeding: Secondary | ICD-10-CM | POA: Diagnosis not present

## 2016-01-05 DIAGNOSIS — K219 Gastro-esophageal reflux disease without esophagitis: Secondary | ICD-10-CM

## 2016-01-05 DIAGNOSIS — K5732 Diverticulitis of large intestine without perforation or abscess without bleeding: Secondary | ICD-10-CM

## 2016-01-05 MED ORDER — PEG 3350-KCL-NA BICARB-NACL 420 G PO SOLR: 4000.0000 mL | ORAL | Status: DC

## 2016-01-05 NOTE — Patient Instructions (Signed)
1. Colonoscopy and upper endoscopy with Dr. Rourk. Please see separate instructions.  

## 2016-01-05 NOTE — Progress Notes (Signed)
Primary Care Physician:  Gwendolyn Grant, MD  Primary Gastroenterologist:  Garfield Cornea, MD   Chief Complaint  Patient presents with  . er referral  . Abdominal Pain    HPI:  Dorothy Lee is a 75 y.o. female here to establish care with Dr. Gala Romney. Recently seen in the ER for diverticulitis. Patient's fiance is a patient here and she was impressed with Dr. Gala Romney when he took care of him in the hospital.   Patient recently had acute onset LLQ. Initially saw her gyn but they did not find gyn source. She ended up in the ER. CT on 11/30/15 showed acute sigmoid diverticulitis affecting an 8-10cm segment of the bowel. Secondary involvement of the left adnexa. 84mm medial basal segment left lower long lung nodule follow up chest CT in 1 year recommended.   She was given cipro/flagyl. LLQ essentially resolved. No pain most days. BM regular on Miralax. No melena, brbpr. No heartburn, vomiting. Some dysphagia to breads. History of cough for five years, finally went away. H/o fatigue since death of only child in 07/31/2015, age 73.    Current Outpatient Prescriptions  Medication Sig Dispense Refill  . aspirin EC 81 MG tablet Take 81 mg by mouth every morning.    . cetirizine (ZYRTEC) 10 MG tablet Take 10 mg by mouth at bedtime.    . CRESTOR 10 MG tablet TAKE ONE TABLET BY MOUTH AT BEDTIME 30 tablet 11  . latanoprost (XALATAN) 0.005 % ophthalmic solution Place 2 drops into both eyes at bedtime.    . nitroGLYCERIN (NITROSTAT) 0.4 MG SL tablet Place 0.4 mg under the tongue every 5 (five) minutes x 3 doses as needed for chest pain. May repeat x3    . pantoprazole (PROTONIX) 40 MG tablet Take 40 mg by mouth daily.     No current facility-administered medications for this visit.    Allergies as of 01/05/2016 - Review Complete 01/05/2016  Allergen Reaction Noted  . Crestor [rosuvastatin]  08/11/2013  . Lipitor [atorvastatin]  08/11/2013  . Lisinopril Cough 02/16/2013  . Simvastatin  08/11/2013     Past Medical History  Diagnosis Date  . History of breast cancer     right - s/p XRT 1984, surg, no chemo  . Hyperlipidemia   . GERD (gastroesophageal reflux disease)   . Urinary incontinence   . Scoliosis     with mild chronic LBP  . Allergic rhinitis   . ALLERGIC RHINITIS   . Cough     chronic  . HYPERTENSION, BORDERLINE   . ROTATOR CUFF SYNDROME, LEFT   . Diverticulosis   . Glaucoma   . CAD (coronary artery disease)     DES RCA and LAD 02/2008, cath 11/2012 patent RCA and LAD stents with new mild LAD stenosis s/p DES    Past Surgical History  Procedure Laterality Date  . Appendectomy  1981  . Coronary angioplasty with stent placement  02/2008  . Breast biopsy  1984  . Breast lumpectomy  1982    right breast  . Left heart catheterization with coronary angiogram N/A 12/02/2012    Procedure: LEFT HEART CATHETERIZATION WITH CORONARY ANGIOGRAM;  Surgeon: Jettie Booze, MD;  Location: [facility] CATH LAB;  Service: Cardiovascular;  Laterality: N/A;  . Percutaneous coronary stent intervention (pci-s)  12/02/2012    Procedure: PERCUTANEOUS CORONARY STENT INTERVENTION (PCI-S);  Surgeon: Jettie Booze, MD;  Location: [facility] CATH LAB;  Service: Cardiovascular;;  DES to the MID LAD  . Nasal sinus surgery    .  Esophagogastroduodenoscopy  2014    Dr. Fuller Plan: erosive gastritis, no h.pylori  . Colonoscopy  2008    Dr. Oletta Lamas: diverticulosis, hemorrhoids, conscious sedation    Family History  Problem Relation Age of Onset  . Emphysema Father   . Emphysema Brother   . Heart disease Brother     x3 brothers  . Lung cancer Brother   . Throat cancer Brother     head/neck cancer  . Colon cancer Neg Hx     Social History   Social History  . Marital Status: Divorced    Spouse Name: N/A  . Number of Children: 1  . Years of Education: N/A   Occupational History  . hair stylist   . COSMETOLOGY    Social History Main Topics  . Smoking status: Never Smoker   . Smokeless  tobacco: Never Used     Comment: Widowed, lives alone but has sig other.   . Alcohol Use: No     Comment: social alcohol > wine 2x week  . Drug Use: No  . Sexual Activity: Not on file   Other Topics Concern  . 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 Eyes: Negative for vision changes.  ENT: Negative for hoarseness, difficulty swallowing , nasal congestion. CV: Negative for chest pain, angina, palpitations, dyspnea on exertion, peripheral edema.  Respiratory: Negative for dyspnea at rest, dyspnea on exertion, cough, sputum, wheezing.  GI: See history of present illness. GU:  Negative for dysuria, hematuria, urinary incontinence, urinary frequency, nocturnal urination.  MS: Negative for joint pain, low back pain.  Derm: Negative for rash or itching.  Neuro: Negative for weakness, abnormal sensation, seizure, frequent headaches, memory loss, confusion.  Psych: Negative for anxiety, depression, suicidal ideation, hallucinations.  Endo: Negative for unusual weight change.  Heme: Negative for bruising or bleeding. Allergy: Negative for rash or hives.    Physical Examination:  BP 142/77 mmHg  Pulse 91  Temp(Src) 96.4 F (35.8 C)  Ht 5\' 3"  (1.6 m)  Wt 140 lb 12.8 oz (63.866 kg)  BMI 24.95 kg/m2   General: Well-nourished, well-developed in no acute distress.  Head: Normocephalic, atraumatic.   Eyes: Conjunctiva pink, no icterus. Mouth: Oropharyngeal mucosa moist and pink , no lesions erythema or exudate. Neck: Supple without thyromegaly, masses, or lymphadenopathy.  Lungs: Clear to auscultation bilaterally.  Heart: Regular rate and rhythm, no murmurs rubs or gallops.  Abdomen: Bowel sounds are normal, nontender, nondistended, no  hepatosplenomegaly or masses, no abdominal bruits or    hernia , no rebound or guarding.   Rectal: not performed Extremities: No lower extremity edema. No clubbing or deformities.  Neuro: Alert and oriented x 4 , grossly normal neurologically.  Skin: Warm and dry, no rash or jaundice.   Psych: Alert and cooperative, normal mood and affect.  Labs: Lab Results  Component Value Date   WBC 11.9* 11/30/2015   HGB 13.0 11/30/2015   HCT 40.2 11/30/2015   MCV 92.2 11/30/2015   PLT 340 11/30/2015   Lab Results  Component Value Date   CREATININE 0.77 11/30/2015   BUN 11 11/30/2015   NA 141 11/30/2015   K 4.0 11/30/2015   CL 103 11/30/2015   CO2  31 11/30/2015   Lab Results  Component Value Date   ALT 23 08/19/2014   AST 23 11/09/2013   ALKPHOS 56 11/09/2013   BILITOT 0.9 11/09/2013     Imaging Studies:   CLINICAL DATA: 75 year old female with left flank pain radiating to the abdomen with polyuria. Initial encounter.  EXAM: CT ABDOMEN AND PELVIS WITH CONTRAST  TECHNIQUE: Multidetector CT imaging of the abdomen and pelvis was performed using the standard protocol following bolus administration of intravenous contrast.  CONTRAST: 127mL OMNIPAQUE IOHEXOL 300 MG/ML SOLN  COMPARISON: Lumbar radiographs 08/04/2009.  FINDINGS: Scarring in the inferior right middle lobe. Scarring with some fat density in the posterior basal segment of the right lower lobe. Minimal scarring in the left lingula and posterior costophrenic angle. 4 mm left lower lobe lung nodule on series 7, image 13. No pericardial or pleural effusion.  Osteopenia. Advanced lower lumbar spine degeneration. No acute osseous abnormality identified.  No pelvic free fluid. The rectum appears normal.  There is an 8-10 cm segment of the sigmoid colon which is abnormally enlarged and indistinct with wall thickening and mild mesenteric stranding. This is inseparable from the left adnexa (see  coronal image 43). There are numerous diverticula. The uterus an urinary bladder do not appear directly involved. Associated left pelvic sidewall/retroperitoneal stranding. No fluid collection. No extraluminal gas identified.  Negative right adnexa. Diverticulosis continues in the left colon but there is no active inflammation. Mild diverticulosis in the transverse colon. Oral contrast has reached the ascending colon. Appendix is diminutive or absent. Negative terminal ileum. No dilated or abnormal small bowel loops. Negative stomach and duodenum.  No abdominal free air or free fluid. Liver, gallbladder, spleen, pancreas and adrenal glands are within normal limits. Portal venous system is patent. Bilateral renal enhancement and contrast excretion within normal limits.  Extensive Aortoiliac calcified atherosclerosis noted. Major arterial structures in the abdomen and pelvis are patent. Calcified plaque continues into the proximal femoral arteries. No lymphadenopathy.  IMPRESSION: 1. Acute sigmoid diverticulitis affecting an 8 to 10 cm segment of the bowel. Secondary involvement of the left adnexa, but no other complicating features. 2. No other acute findings in the abdomen or pelvis. Diverticulosis elsewhere. Extensive calcified aortic atherosclerosis. 3. Lung base scarring. 4 mm medial basal segment left lower lobe lung nodule. If the patient is at high risk for bronchogenic carcinoma, follow-up chest CT at 1 year is recommended. If the patient is at low risk, no follow-up is needed. This recommendation follows the consensus statement: Guidelines for Management of Small Pulmonary Nodules Detected on CT Scans: A Statement from the [place] as published in Radiology 2005; 237:395-400.   Electronically Signed  By: Genevie Ann M.D.  On: 11/30/2015 09:27

## 2016-01-06 ENCOUNTER — Encounter: Payer: Self-pay | Admitting: Gastroenterology

## 2016-01-06 NOTE — Assessment & Plan Note (Signed)
Chronic GERD, solid food esophageal dysphagia. Heartburn well-controlled. Complains of dysphagia to bread. Worsening symptoms. No prior esophageal dilation. Recommend EGD/ED in near future.  I have discussed the risks, alternatives, benefits with regards to but not limited to the risk of reaction to medication, bleeding, infection, perforation and the patient is agreeable to proceed. Written consent to be obtained.

## 2016-01-06 NOTE — Assessment & Plan Note (Signed)
Acute diverticulitis, first episode, symptoms resolved with antibiotics. Recommend colonoscopy for further evaluation of abnormal colon on CT especially with last colonoscopy nearly 10 years ago.  I have discussed the risks, alternatives, benefits with regards to but not limited to the risk of reaction to medication, bleeding, infection, perforation and the patient is agreeable to proceed. Written consent to be obtained.

## 2016-01-09 NOTE — Progress Notes (Signed)
CC'D TO PCP °

## 2016-01-18 DIAGNOSIS — M19012 Primary osteoarthritis, left shoulder: Secondary | ICD-10-CM | POA: Diagnosis not present

## 2016-01-30 ENCOUNTER — Encounter (HOSPITAL_COMMUNITY)
Admission: RE | Disposition: A | Discharge status: Home / Self Care | Payer: Self-pay | Source: Ambulatory Visit | Attending: Internal Medicine

## 2016-01-30 ENCOUNTER — Ambulatory Visit (HOSPITAL_COMMUNITY)
Admission: RE | Admit: 2016-01-30 | Discharge: 2016-01-30 | Disposition: A | Discharge status: Home / Self Care | Payer: Medicare Other | Source: Ambulatory Visit | Attending: Internal Medicine | Admitting: Internal Medicine

## 2016-01-30 ENCOUNTER — Encounter (HOSPITAL_COMMUNITY): Payer: Self-pay | Admitting: *Deleted

## 2016-01-30 DIAGNOSIS — K219 Gastro-esophageal reflux disease without esophagitis: Secondary | ICD-10-CM | POA: Insufficient documentation

## 2016-01-30 DIAGNOSIS — R109 Unspecified abdominal pain: Secondary | ICD-10-CM | POA: Diagnosis not present

## 2016-01-30 DIAGNOSIS — Z79899 Other long term (current) drug therapy: Secondary | ICD-10-CM | POA: Diagnosis not present

## 2016-01-30 DIAGNOSIS — Z955 Presence of coronary angioplasty implant and graft: Secondary | ICD-10-CM | POA: Insufficient documentation

## 2016-01-30 DIAGNOSIS — R1310 Dysphagia, unspecified: Secondary | ICD-10-CM | POA: Diagnosis not present

## 2016-01-30 DIAGNOSIS — R933 Abnormal findings on diagnostic imaging of other parts of digestive tract: Secondary | ICD-10-CM

## 2016-01-30 DIAGNOSIS — D122 Benign neoplasm of ascending colon: Secondary | ICD-10-CM | POA: Diagnosis not present

## 2016-01-30 DIAGNOSIS — Z7982 Long term (current) use of aspirin: Secondary | ICD-10-CM | POA: Diagnosis not present

## 2016-01-30 DIAGNOSIS — I2510 Atherosclerotic heart disease of native coronary artery without angina pectoris: Secondary | ICD-10-CM | POA: Insufficient documentation

## 2016-01-30 DIAGNOSIS — Z853 Personal history of malignant neoplasm of breast: Secondary | ICD-10-CM | POA: Insufficient documentation

## 2016-01-30 DIAGNOSIS — E785 Hyperlipidemia, unspecified: Secondary | ICD-10-CM | POA: Insufficient documentation

## 2016-01-30 DIAGNOSIS — Z860100 Personal history of colon polyps, unspecified: Secondary | ICD-10-CM | POA: Insufficient documentation

## 2016-01-30 DIAGNOSIS — K449 Diaphragmatic hernia without obstruction or gangrene: Secondary | ICD-10-CM | POA: Insufficient documentation

## 2016-01-30 DIAGNOSIS — D123 Benign neoplasm of transverse colon: Secondary | ICD-10-CM | POA: Diagnosis not present

## 2016-01-30 DIAGNOSIS — K5732 Diverticulitis of large intestine without perforation or abscess without bleeding: Secondary | ICD-10-CM

## 2016-01-30 DIAGNOSIS — K5730 Diverticulosis of large intestine without perforation or abscess without bleeding: Secondary | ICD-10-CM | POA: Diagnosis not present

## 2016-01-30 HISTORY — PX: COLONOSCOPY: SHX5424

## 2016-01-30 HISTORY — PX: ESOPHAGOGASTRODUODENOSCOPY: SHX5428

## 2016-01-30 HISTORY — PX: MALONEY DILATION: SHX5535

## 2016-01-30 SURGERY — COLONOSCOPY
Anesthesia: Moderate Sedation

## 2016-01-30 MED ORDER — SODIUM CHLORIDE 0.9 % IV SOLN
INTRAVENOUS | Status: DC
  Administered 2016-01-30: 13:00:00 via INTRAVENOUS

## 2016-01-30 MED ORDER — MEPERIDINE HCL 100 MG/ML IJ SOLN
INTRAMUSCULAR | Status: DC | PRN
  Administered 2016-01-30: 25 mg via INTRAVENOUS
  Administered 2016-01-30: 50 mg via INTRAVENOUS
  Administered 2016-01-30: 25 mg via INTRAVENOUS

## 2016-01-30 MED ORDER — LIDOCAINE VISCOUS 2 % MT SOLN
OROMUCOSAL | Status: AC
  Filled 2016-01-30: qty 15

## 2016-01-30 MED ORDER — ONDANSETRON HCL 4 MG/2ML IJ SOLN
INTRAMUSCULAR | Status: AC
  Filled 2016-01-30: qty 2

## 2016-01-30 MED ORDER — MEPERIDINE HCL 100 MG/ML IJ SOLN
INTRAMUSCULAR | Status: AC
  Filled 2016-01-30: qty 2

## 2016-01-30 MED ORDER — MIDAZOLAM HCL 5 MG/5ML IJ SOLN
INTRAMUSCULAR | Status: DC | PRN
  Administered 2016-01-30 (×2): 1 mg via INTRAVENOUS
  Administered 2016-01-30: 2 mg via INTRAVENOUS
  Administered 2016-01-30: 1 mg via INTRAVENOUS

## 2016-01-30 MED ORDER — ONDANSETRON HCL 4 MG/2ML IJ SOLN
INTRAMUSCULAR | Status: DC | PRN
  Administered 2016-01-30: 4 mg via INTRAVENOUS

## 2016-01-30 MED ORDER — LIDOCAINE VISCOUS 2 % MT SOLN
OROMUCOSAL | Status: DC | PRN
  Administered 2016-01-30: 3 mL via OROMUCOSAL

## 2016-01-30 MED ORDER — STERILE WATER FOR IRRIGATION IR SOLN
Status: DC | PRN
  Administered 2016-01-30: 14:00:00

## 2016-01-30 MED ORDER — MIDAZOLAM HCL 5 MG/5ML IJ SOLN
INTRAMUSCULAR | Status: AC
  Filled 2016-01-30: qty 10

## 2016-01-30 NOTE — Discharge Instructions (Signed)
Colonoscopy Discharge Instructions  Read the instructions outlined below and refer to this sheet in the next few weeks. These discharge instructions provide you with general information on caring for yourself after you leave the hospital. Your doctor may also give you specific instructions. While your treatment has been planned according to the most current medical practices available, unavoidable complications occasionally occur. If you have any problems or questions after discharge, call Dr. Gala Romney at [phone]. ACTIVITY  You may resume your regular activity, but move at a slower pace for the next 24 hours.   Take frequent rest periods for the next 24 hours.   Walking will help get rid of the air and reduce the bloated feeling in your belly (abdomen).   No driving for 24 hours (because of the medicine (anesthesia) used during the test).    Do not sign any important legal documents or operate any machinery for 24 hours (because of the anesthesia used during the test).  NUTRITION  Drink plenty of fluids.   You may resume your normal diet as instructed by your doctor.   Begin with a light meal and progress to your normal diet. Heavy or fried foods are harder to digest and may make you feel sick to your stomach (nauseated).   Avoid alcoholic beverages for 24 hours or as instructed.  MEDICATIONS  You may resume your normal medications unless your doctor tells you otherwise.  WHAT YOU CAN EXPECT TODAY  Some feelings of bloating in the abdomen.   Passage of more gas than usual.   Spotting of blood in your stool or on the toilet paper.  IF YOU HAD POLYPS REMOVED DURING THE COLONOSCOPY:  No aspirin products for 7 days or as instructed.   No alcohol for 7 days or as instructed.   Eat a soft diet for the next 24 hours.  FINDING OUT THE RESULTS OF YOUR TEST Not all test results are available during your visit. If your test results are not back during the visit, make an appointment  with your caregiver to find out the results. Do not assume everything is normal if you have not heard from your caregiver or the medical facility. It is important for you to follow up on all of your test results.  SEEK IMMEDIATE MEDICAL ATTENTION IF:  You have more than a spotting of blood in your stool.   Your belly is swollen (abdominal distention).   You are nauseated or vomiting.   You have a temperature over 101.  You have abdominal pain or discomfort that is severe or gets worse throughout the day. EGD Discharge instructions Please read the instructions outlined below and refer to this sheet in the next few weeks. These discharge instructions provide you with general information on caring for yourself after you leave the hospital. Your doctor may also give you specific instructions. While your treatment has been planned according to the most current medical practices available, unavoidable complications occasionally occur. If you have any problems or questions after discharge, please call your doctor. ACTIVITY You may resume your regular activity but move at a slower pace for the next 24 hours.  Take frequent rest periods for the next 24 hours.  Walking will help expel (get rid of) the air and reduce the bloated feeling in your abdomen.  No driving for 24 hours (because of the anesthesia (medicine) used during the test).  You may shower.  Do not sign any important legal documents or operate any machinery for 24  hours (because of the anesthesia used during the test).  NUTRITION Drink plenty of fluids.  You may resume your normal diet.  Begin with a light meal and progress to your normal diet.  Avoid alcoholic beverages for 24 hours or as instructed by your caregiver.  MEDICATIONS You may resume your normal medications unless your caregiver tells you otherwise.  WHAT YOU CAN EXPECT TODAY You may experience abdominal discomfort such as a feeling of fullness or gas pains.   FOLLOW-UP Your doctor will discuss the results of your test with you.  SEEK IMMEDIATE MEDICAL ATTENTION IF ANY OF THE FOLLOWING OCCUR: Excessive nausea (feeling sick to your stomach) and/or vomiting.  Severe abdominal pain and distention (swelling).  Trouble swallowing.  Temperature over 101 F (37.8 C).  Rectal bleeding or vomiting of blood.    GERD and colon polyp and diverticulosis information provided  Take Benefiber 1 tablespoon twice daily  Further recommendations to follow pending review of pathology report  Diverticulosis Diverticulosis is the condition that develops when small pouches (diverticula) form in the wall of your colon. Your colon, or large intestine, is where water is absorbed and stool is formed. The pouches form when the inside layer of your colon pushes through weak spots in the outer layers of your colon. CAUSES  No one knows exactly what causes diverticulosis. RISK FACTORS  Being older than 44. Your risk for this condition increases with age. Diverticulosis is rare in people younger than 40 years. By age 23, almost everyone has it.  Eating a low-fiber diet.  Being frequently constipated.  Being overweight.  Not getting enough exercise.  Smoking.  Taking over-the-counter pain medicines, like aspirin and ibuprofen. SYMPTOMS  Most people with diverticulosis do not have symptoms. DIAGNOSIS  Because diverticulosis often has no symptoms, health care providers often discover the condition during an exam for other colon problems. In many cases, a health care provider will diagnose diverticulosis while using a flexible scope to examine the colon (colonoscopy). TREATMENT  If you have never developed an infection related to diverticulosis, you may not need treatment. If you have had an infection before, treatment may include:  Eating more fruits, vegetables, and grains.  Taking a fiber supplement.  Taking a live bacteria supplement (probiotic).  Taking  medicine to relax your colon. HOME CARE INSTRUCTIONS   Drink at least 6-8 glasses of water each day to prevent constipation.  Try not to strain when you have a bowel movement.  Keep all follow-up appointments. If you have had an infection before:  Increase the fiber in your diet as directed by your health care provider or dietitian.  Take a dietary fiber supplement if your health care provider approves.  Only take medicines as directed by your health care provider. SEEK MEDICAL CARE IF:   You have abdominal pain.  You have bloating.  You have cramps.  You have not gone to the bathroom in 3 days. SEEK IMMEDIATE MEDICAL CARE IF:   Your pain gets worse.  Yourbloating becomes very bad.  You have a fever or chills, and your symptoms suddenly get worse.  You begin vomiting.  You have bowel movements that are bloody or black. MAKE SURE YOU:  Understand these instructions.  Will watch your condition.  Will get help right away if you are not doing well or get worse.   This information is not intended to replace advice given to you by your health care provider. Make sure you discuss any questions you have with  your health care provider.   Document Released: 06/21/2004 Document Revised: 09/29/2013 Document Reviewed: 08/19/2013 Elsevier Interactive Patient Education 2016 [employer]   Colon Polyps Polyps are lumps of extra tissue growing inside the body. Polyps can grow in the large intestine (colon). Most colon polyps are noncancerous (benign). However, some colon polyps can become cancerous over time. Polyps that are larger than a pea may be harmful. To be safe, caregivers remove and test all polyps. CAUSES  Polyps form when mutations in the genes cause your cells to grow and divide even though no more tissue is needed. RISK FACTORS There are a number of risk factors that can increase your chances of getting colon polyps. They include:  Being older than 50  years.  Family history of colon polyps or colon cancer.  Long-term colon diseases, such as colitis or Crohn disease.  Being overweight.  Smoking.  Being inactive.  Drinking too much alcohol. SYMPTOMS  Most small polyps do not cause symptoms. If symptoms are present, they may include:  Blood in the stool. The stool may look dark red or black.  Constipation or diarrhea that lasts longer than 1 week. DIAGNOSIS People often do not know they have polyps until their caregiver finds them during a regular checkup. Your caregiver can use 4 tests to check for polyps:  Digital rectal exam. The caregiver wears gloves and feels inside the rectum. This test would find polyps only in the rectum.  Barium enema. The caregiver puts a liquid called barium into your rectum before taking X-rays of your colon. Barium makes your colon look white. Polyps are dark, so they are easy to see in the X-ray pictures.  Sigmoidoscopy. A thin, flexible tube (sigmoidoscope) is placed into your rectum. The sigmoidoscope has a light and tiny camera in it. The caregiver uses the sigmoidoscope to look at the last third of your colon.  Colonoscopy. This test is like sigmoidoscopy, but the caregiver looks at the entire colon. This is the most common method for finding and removing polyps. TREATMENT  Any polyps will be removed during a sigmoidoscopy or colonoscopy. The polyps are then tested for cancer. PREVENTION  To help lower your risk of getting more colon polyps:  Eat plenty of fruits and vegetables. Avoid eating fatty foods.  Do not smoke.  Avoid drinking alcohol.  Exercise every day.  Lose weight if recommended by your caregiver.  Eat plenty of calcium and folate. Foods that are rich in calcium include milk, cheese, and broccoli. Foods that are rich in folate include chickpeas, kidney beans, and spinach. HOME CARE INSTRUCTIONS Keep all follow-up appointments as directed by your caregiver. You may need  periodic exams to check for polyps. SEEK MEDICAL CARE IF: You notice bleeding during a bowel movement.   This information is not intended to replace advice given to you by your health care provider. Make sure you discuss any questions you have with your health care provider.   Document Released: 06/20/2004 Document Revised: 10/15/2014 Document Reviewed: 12/04/2011 Elsevier Interactive Patient Education 2016 [place].   Gastroesophageal Reflux Disease, Adult Normally, food travels down the esophagus and stays in the stomach to be digested. However, when a person has gastroesophageal reflux disease (GERD), food and stomach acid move back up into the esophagus. When this happens, the esophagus becomes sore and inflamed. Over time, GERD can create small holes (ulcers) in the lining of the esophagus.  CAUSES This condition is caused by a problem with the muscle between the esophagus  and the stomach (lower esophageal sphincter, or LES). Normally, the LES muscle closes after food passes through the esophagus to the stomach. When the LES is weakened or abnormal, it does not close properly, and that allows food and stomach acid to go back up into the esophagus. The LES can be weakened by certain dietary substances, medicines, and medical conditions, including: Tobacco use. Pregnancy. Having a hiatal hernia. Heavy alcohol use. Certain foods and beverages, such as coffee, chocolate, onions, and peppermint. RISK FACTORS This condition is more likely to develop in: People who have an increased body weight. People who have connective tissue disorders. People who use NSAID medicines. SYMPTOMS Symptoms of this condition include: Heartburn. Difficult or painful swallowing. The feeling of having a lump in the throat. Abitter taste in the mouth. Bad breath. Having a large amount of saliva. Having an upset or bloated stomach. Belching. Chest pain. Shortness of breath or wheezing. Ongoing  (chronic) cough or a night-time cough. Wearing away of tooth enamel. Weight loss. Different conditions can cause chest pain. Make sure to see your health care provider if you experience chest pain. DIAGNOSIS Your health care provider will take a medical history and perform a physical exam. To determine if you have mild or severe GERD, your health care provider may also monitor how you respond to treatment. You may also have other tests, including: An endoscopy toexamine your stomach and esophagus with a small camera. A test thatmeasures the acidity level in your esophagus. A test thatmeasures how much pressure is on your esophagus. A barium swallow or modified barium swallow to show the shape, size, and functioning of your esophagus. TREATMENT The goal of treatment is to help relieve your symptoms and to prevent complications. Treatment for this condition may vary depending on how severe your symptoms are. Your health care provider may recommend: Changes to your diet. Medicine. Surgery. HOME CARE INSTRUCTIONS Diet Follow a diet as recommended by your health care provider. This may involve avoiding foods and drinks such as: Coffee and tea (with or without caffeine). Drinks that containalcohol. Energy drinks and sports drinks. Carbonated drinks or sodas. Chocolate and cocoa. Peppermint and mint flavorings. Garlic and onions. Horseradish. Spicy and acidic foods, including peppers, chili powder, curry powder, vinegar, hot sauces, and barbecue sauce. Citrus fruit juices and citrus fruits, such as oranges, lemons, and limes. Tomato-based foods, such as red sauce, chili, salsa, and pizza with red sauce. Fried and fatty foods, such as donuts, french fries, potato chips, and high-fat dressings. High-fat meats, such as hot dogs and fatty cuts of red and white meats, such as rib eye steak, sausage, ham, and bacon. High-fat dairy items, such as whole milk, butter, and cream cheese. Eat small,  frequent meals instead of large meals. Avoid drinking large amounts of liquid with your meals. Avoid eating meals during the 2-3 hours before bedtime. Avoid lying down right after you eat. Do not exercise right after you eat. General Instructions Pay attention to any changes in your symptoms. Take over-the-counter and prescription medicines only as told by your health care provider. Do not take aspirin, ibuprofen, or other NSAIDs unless your health care provider told you to do so. Do not use any tobacco products, including cigarettes, chewing tobacco, and e-cigarettes. If you need help quitting, ask your health care provider. Wear loose-fitting clothing. Do not wear anything tight around your waist that causes pressure on your abdomen. Raise (elevate) the head of your bed 6 inches (15cm). Try to reduce your stress,  such as with yoga or meditation. If you need help reducing stress, ask your health care provider. If you are overweight, reduce your weight to an amount that is healthy for you. Ask your health care provider for guidance about a safe weight loss goal. Keep all follow-up visits as told by your health care provider. This is important. SEEK MEDICAL CARE IF: You have new symptoms. You have unexplained weight loss. You have difficulty swallowing, or it hurts to swallow. You have wheezing or a persistent cough. Your symptoms do not improve with treatment. You have a hoarse voice. SEEK IMMEDIATE MEDICAL CARE IF: You have pain in your arms, neck, jaw, teeth, or back. You feel sweaty, dizzy, or light-headed. You have chest pain or shortness of breath. You vomit and your vomit looks like blood or coffee grounds. You faint. Your stool is bloody or black. You cannot swallow, drink, or eat.   This information is not intended to replace advice given to you by your health care provider. Make sure you discuss any questions you have with your health care provider.   Document Released:  07/04/2005 Document Revised: 06/15/2015 Document Reviewed: 01/19/2015 Elsevier Interactive Patient Education [employer].

## 2016-01-30 NOTE — Interval H&P Note (Signed)
History and Physical Interval Note:  01/30/2016 1:27 PM  Dorothy Lee  has presented today for surgery, with the diagnosis of GERD/DYSPHAGIA/DIVERTICULITIS  The various methods of treatment have been discussed with the patient and family. After consideration of risks, benefits and other options for treatment, the patient has consented to  Procedure(s) with comments: COLONOSCOPY (N/A) - 130  ESOPHAGOGASTRODUODENOSCOPY (EGD) (N/A) MALONEY DILATION (N/A) as a surgical intervention .  The patient's history has been reviewed, patient examined, no change in status, stable for surgery.  I have reviewed the patient's chart and labs.  Questions were answered to the patient's satisfaction.     Dorothy Lee  No change. EGD/EGD and diagnostic colonoscopy per plan.  The risks, benefits, limitations, imponderables and alternatives regarding both EGD and colonoscopy have been reviewed with the patient. Questions have been answered. All parties agreeable.

## 2016-01-30 NOTE — H&P (View-Only) (Signed)
Primary Care Physician:  Gwendolyn Grant, MD  Primary Gastroenterologist:  Garfield Cornea, MD   Chief Complaint  Patient presents with  . er referral  . Abdominal Pain    HPI:  Dorothy Lee is a 75 y.o. female here to establish care with Dr. Gala Romney. Recently seen in the ER for diverticulitis. Patient's fiance is a patient here and she was impressed with Dr. Gala Romney when he took care of him in the hospital.   Patient recently had acute onset LLQ. Initially saw her gyn but they did not find gyn source. She ended up in the ER. CT on 11/30/15 showed acute sigmoid diverticulitis affecting an 8-10cm segment of the bowel. Secondary involvement of the left adnexa. 31mm medial basal segment left lower long lung nodule follow up chest CT in 1 year recommended.   She was given cipro/flagyl. LLQ essentially resolved. No pain most days. BM regular on Miralax. No melena, brbpr. No heartburn, vomiting. Some dysphagia to breads. History of cough for five years, finally went away. H/o fatigue since death of only child in Jul 13, 2015, age 32.    Current Outpatient Prescriptions  Medication Sig Dispense Refill  . aspirin EC 81 MG tablet Take 81 mg by mouth every morning.    . cetirizine (ZYRTEC) 10 MG tablet Take 10 mg by mouth at bedtime.    . CRESTOR 10 MG tablet TAKE ONE TABLET BY MOUTH AT BEDTIME 30 tablet 11  . latanoprost (XALATAN) 0.005 % ophthalmic solution Place 2 drops into both eyes at bedtime.    . nitroGLYCERIN (NITROSTAT) 0.4 MG SL tablet Place 0.4 mg under the tongue every 5 (five) minutes x 3 doses as needed for chest pain. May repeat x3    . pantoprazole (PROTONIX) 40 MG tablet Take 40 mg by mouth daily.     No current facility-administered medications for this visit.    Allergies as of 01/05/2016 - Review Complete 01/05/2016  Allergen Reaction Noted  . Crestor [rosuvastatin]  08/11/2013  . Lipitor [atorvastatin]  08/11/2013  . Lisinopril Cough 02/16/2013  . Simvastatin  08/11/2013     Past Medical History  Diagnosis Date  . History of breast cancer     right - s/p XRT 1984, surg, no chemo  . Hyperlipidemia   . GERD (gastroesophageal reflux disease)   . Urinary incontinence   . Scoliosis     with mild chronic LBP  . Allergic rhinitis   . ALLERGIC RHINITIS   . Cough     chronic  . HYPERTENSION, BORDERLINE   . ROTATOR CUFF SYNDROME, LEFT   . Diverticulosis   . Glaucoma   . CAD (coronary artery disease)     DES RCA and LAD 02/2008, cath 11/2012 patent RCA and LAD stents with new mild LAD stenosis s/p DES    Past Surgical History  Procedure Laterality Date  . Appendectomy  1981  . Coronary angioplasty with stent placement  02/2008  . Breast biopsy  1984  . Breast lumpectomy  1982    right breast  . Left heart catheterization with coronary angiogram N/A 12/02/2012    Procedure: LEFT HEART CATHETERIZATION WITH CORONARY ANGIOGRAM;  Surgeon: Jettie Booze, MD;  Location: [facility] CATH LAB;  Service: Cardiovascular;  Laterality: N/A;  . Percutaneous coronary stent intervention (pci-s)  12/02/2012    Procedure: PERCUTANEOUS CORONARY STENT INTERVENTION (PCI-S);  Surgeon: Jettie Booze, MD;  Location: [facility] CATH LAB;  Service: Cardiovascular;;  DES to the MID LAD  . Nasal sinus surgery    .  Esophagogastroduodenoscopy  2014    Dr. Fuller Plan: erosive gastritis, no h.pylori  . Colonoscopy  2008    Dr. Oletta Lamas: diverticulosis, hemorrhoids, conscious sedation    Family History  Problem Relation Age of Onset  . Emphysema Father   . Emphysema Brother   . Heart disease Brother     x3 brothers  . Lung cancer Brother   . Throat cancer Brother     head/neck cancer  . Colon cancer Neg Hx     Social History   Social History  . Marital Status: Divorced    Spouse Name: N/A  . Number of Children: 1  . Years of Education: N/A   Occupational History  . hair stylist   . COSMETOLOGY    Social History Main Topics  . Smoking status: Never Smoker   . Smokeless  tobacco: Never Used     Comment: Widowed, lives alone but has sig other.   . Alcohol Use: No     Comment: social alcohol > wine 2x week  . Drug Use: No  . Sexual Activity: Not on file   Other Topics Concern  . 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 Eyes: Negative for vision changes.  ENT: Negative for hoarseness, difficulty swallowing , nasal congestion. CV: Negative for chest pain, angina, palpitations, dyspnea on exertion, peripheral edema.  Respiratory: Negative for dyspnea at rest, dyspnea on exertion, cough, sputum, wheezing.  GI: See history of present illness. GU:  Negative for dysuria, hematuria, urinary incontinence, urinary frequency, nocturnal urination.  MS: Negative for joint pain, low back pain.  Derm: Negative for rash or itching.  Neuro: Negative for weakness, abnormal sensation, seizure, frequent headaches, memory loss, confusion.  Psych: Negative for anxiety, depression, suicidal ideation, hallucinations.  Endo: Negative for unusual weight change.  Heme: Negative for bruising or bleeding. Allergy: Negative for rash or hives.    Physical Examination:  BP 142/77 mmHg  Pulse 91  Temp(Src) 96.4 F (35.8 C)  Ht 5\' 3"  (1.6 m)  Wt 140 lb 12.8 oz (63.866 kg)  BMI 24.95 kg/m2   General: Well-nourished, well-developed in no acute distress.  Head: Normocephalic, atraumatic.   Eyes: Conjunctiva pink, no icterus. Mouth: Oropharyngeal mucosa moist and pink , no lesions erythema or exudate. Neck: Supple without thyromegaly, masses, or lymphadenopathy.  Lungs: Clear to auscultation bilaterally.  Heart: Regular rate and rhythm, no murmurs rubs or gallops.  Abdomen: Bowel sounds are normal, nontender, nondistended, no  hepatosplenomegaly or masses, no abdominal bruits or    hernia , no rebound or guarding.   Rectal: not performed Extremities: No lower extremity edema. No clubbing or deformities.  Neuro: Alert and oriented x 4 , grossly normal neurologically.  Skin: Warm and dry, no rash or jaundice.   Psych: Alert and cooperative, normal mood and affect.  Labs: Lab Results  Component Value Date   WBC 11.9* 11/30/2015   HGB 13.0 11/30/2015   HCT 40.2 11/30/2015   MCV 92.2 11/30/2015   PLT 340 11/30/2015   Lab Results  Component Value Date   CREATININE 0.77 11/30/2015   BUN 11 11/30/2015   NA 141 11/30/2015   K 4.0 11/30/2015   CL 103 11/30/2015   CO2  31 11/30/2015   Lab Results  Component Value Date   ALT 23 08/19/2014   AST 23 11/09/2013   ALKPHOS 56 11/09/2013   BILITOT 0.9 11/09/2013     Imaging Studies:   CLINICAL DATA: 76 year old female with left flank pain radiating to the abdomen with polyuria. Initial encounter.  EXAM: CT ABDOMEN AND PELVIS WITH CONTRAST  TECHNIQUE: Multidetector CT imaging of the abdomen and pelvis was performed using the standard protocol following bolus administration of intravenous contrast.  CONTRAST: 176mL OMNIPAQUE IOHEXOL 300 MG/ML SOLN  COMPARISON: Lumbar radiographs 08/04/2009.  FINDINGS: Scarring in the inferior right middle lobe. Scarring with some fat density in the posterior basal segment of the right lower lobe. Minimal scarring in the left lingula and posterior costophrenic angle. 4 mm left lower lobe lung nodule on series 7, image 13. No pericardial or pleural effusion.  Osteopenia. Advanced lower lumbar spine degeneration. No acute osseous abnormality identified.  No pelvic free fluid. The rectum appears normal.  There is an 8-10 cm segment of the sigmoid colon which is abnormally enlarged and indistinct with wall thickening and mild mesenteric stranding. This is inseparable from the left adnexa (see  coronal image 43). There are numerous diverticula. The uterus an urinary bladder do not appear directly involved. Associated left pelvic sidewall/retroperitoneal stranding. No fluid collection. No extraluminal gas identified.  Negative right adnexa. Diverticulosis continues in the left colon but there is no active inflammation. Mild diverticulosis in the transverse colon. Oral contrast has reached the ascending colon. Appendix is diminutive or absent. Negative terminal ileum. No dilated or abnormal small bowel loops. Negative stomach and duodenum.  No abdominal free air or free fluid. Liver, gallbladder, spleen, pancreas and adrenal glands are within normal limits. Portal venous system is patent. Bilateral renal enhancement and contrast excretion within normal limits.  Extensive Aortoiliac calcified atherosclerosis noted. Major arterial structures in the abdomen and pelvis are patent. Calcified plaque continues into the proximal femoral arteries. No lymphadenopathy.  IMPRESSION: 1. Acute sigmoid diverticulitis affecting an 8 to 10 cm segment of the bowel. Secondary involvement of the left adnexa, but no other complicating features. 2. No other acute findings in the abdomen or pelvis. Diverticulosis elsewhere. Extensive calcified aortic atherosclerosis. 3. Lung base scarring. 4 mm medial basal segment left lower lobe lung nodule. If the patient is at high risk for bronchogenic carcinoma, follow-up chest CT at 1 year is recommended. If the patient is at low risk, no follow-up is needed. This recommendation follows the consensus statement: Guidelines for Management of Small Pulmonary Nodules Detected on CT Scans: A Statement from the [place] as published in Radiology 2005; 237:395-400.   Electronically Signed  By: Genevie Ann M.D.  On: 11/30/2015 09:27

## 2016-01-30 NOTE — Op Note (Signed)
[facility] Patient Name: Dorothy Lee Procedure Date: 01/30/2016 1:32 PM MRN: [id number] Date of Birth: Mar 06, 1941 Attending MD: Norvel Richards , MD CSN: [id number] Age: 75 Admit Type: Outpatient Procedure:                Upper GI endoscopy Indications:              Dysphagia Providers:                Norvel Richards, MD, Gwenlyn Fudge, RN, Georgeann Oppenheim, Technician Referring MD:              Medicines:                Midazolam mg IV, Meperidine 100 mg IV, Ondansetron                            4 mg IV Complications:            No immediate complications. Estimated Blood Loss:     Estimated blood loss: none. Procedure:                Pre-Anesthesia Assessment:                           - After reviewing the risks and benefits, the                            patient was deemed in satisfactory condition to                            undergo the procedure.                           - Prior to the procedure, a History and Physical                            was performed, and patient medications and                            allergies were reviewed. The patient's tolerance of                            previous anesthesia was also reviewed. The risks                            and benefits of the procedure and the sedation                            options and risks were discussed with the patient.                            All questions were answered, and informed consent                            was obtained. Prior  Anticoagulants: The patient has                            taken no previous anticoagulant or antiplatelet                            agents. ASA Grade Assessment: II - A patient with                            mild systemic disease. After reviewing the risks                            and benefits, the patient was deemed in                            satisfactory condition to undergo the procedure.                           -  Prior to the procedure, a History and Physical                            was performed, and patient medications and                            allergies were reviewed. The patient's tolerance of                            previous anesthesia was also reviewed. The risks                            and benefits of the procedure and the sedation                            options and risks were discussed with the patient.                            All questions were answered, and informed consent                            was obtained. ASA Grade Assessment: II - A patient                            with mild systemic disease. After reviewing the                            risks and benefits, the patient was deemed in                            satisfactory condition to undergo the procedure.                           After obtaining informed consent, the endoscope was  passed under direct vision. Throughout the                            procedure, the patient's blood pressure, pulse, and                            oxygen saturations were monitored continuously. The                            [id number] scope was introduced through the                            mouth, and advanced to the second part of duodenum.                            The upper GI endoscopy was accomplished without                            difficulty. Scope In: 1:42:29 PM Scope Out: 1:48:49 PM Total Procedure Duration: 0 hours 6 minutes 20 seconds  Findings:      The examined esophagus was normal. The scope was withdrawn. Dilation was       performed with a Maloney dilator with no resistance at 18 Fr. A lipid       back revealed no apparent complication related to this maneuver.      A small hiatal hernia was present.      The exam was otherwise without abnormality.      The second portion of the duodenum was normal. Impression:               - Normal esophagus. Dilated.                            - Small hiatal hernia.                           - The examination was otherwise normal.                           - Normal second portion of the duodenum.                           - No specimens collected. Moderate Sedation:      Moderate (conscious) sedation was administered by the endoscopy nurse       and supervised by the endoscopist. The following parameters were       monitored: oxygen saturation, heart rate, blood pressure, respiratory       rate, EKG, adequacy of pulmonary ventilation, and response to care.       Total physician intraservice time was 41 minutes.      Moderate (conscious) sedation was administered by the endoscopy nurse       and supervised by the endoscopist. The following parameters were       monitored: oxygen saturation, heart rate, blood pressure, respiratory       rate, EKG, adequacy of pulmonary ventilation, and response to care.       Total physician intraservice time was 41 minutes.  Recommendation:           - Patient has a contact number available for                            emergencies. The signs and symptoms of potential                            delayed complications were discussed with the                            patient. Return to normal activities tomorrow.                            Written discharge instructions were provided to the                            patient.                           - Resume previous diet.                           - Continue present medications.                           - Await pathology results.                           - Return to GI office in 2 months. Procedure Code(s):        --- Professional ---                           [phone], Moderate sedation services provided by the                            same physician or other qualified health care                            professional performing the diagnostic or                            therapeutic service that the sedation supports,                             requiring the presence of an independent trained                            observer to assist in the monitoring of the                            patient's level of consciousness and physiological                            status; initial 15 minutes of intraservice time,  patient age 21 years or older                           [phone], Moderate sedation services provided by the                            same physician or other qualified health care                            professional performing the diagnostic or                            therapeutic service that the sedation supports,                            requiring the presence of an independent trained                            observer to assist in the monitoring of the                            patient's level of consciousness and physiological                            status; initial 15 minutes of intraservice time,                            patient age 64 years or older                           [phone], Moderate sedation services; each additional                            15 minutes intraservice time                           [id number], Moderate sedation services; each additional                            15 minutes intraservice time                           [id number], Moderate sedation services; each additional                            15 minutes intraservice time                           [id number], Moderate sedation services; each additional                            15 minutes intraservice time Diagnosis Code(s):        --- Professional ---                           K44.9, Diaphragmatic hernia  without obstruction or                            gangrene                           R13.10, Dysphagia, unspecified CPT copyright 2016 [employer] Medical [employer]. All rights reserved. The codes documented in this report are preliminary and upon coder review may  be revised to  meet current compliance requirements. Cristopher Estimable. Lior Cartelli, MD Norvel Richards, MD 01/30/2016 2:29:31 PM This report has been signed electronically. Number of Addenda: 0

## 2016-01-30 NOTE — Op Note (Signed)
[facility] Patient Name: Healy Furtak Procedure Date: 01/30/2016 1:11 PM MRN: [id number] Date of Birth: May 06, 1941 Attending MD: Norvel Richards , MD CSN: [id number] Age: 75 Admit Type: Outpatient Procedure:                Colonoscopy Indications:              Abnormal CT of the GI tract Providers:                Norvel Richards, MD, Gwenlyn Fudge, RN, Georgeann Oppenheim, Technician Referring MD:             Jannifer Rodney. Leschber Medicines:                Midazolam 5 mg IV, Meperidine 100 mg IV,                            Ondansetron 4 mg IV Complications:            No immediate complications. Estimated Blood Loss:     Estimated blood loss was minimal. Procedure:                Pre-Anesthesia Assessment:                           - Prior to the procedure, a History and Physical                            was performed, and patient medications and                            allergies were reviewed. The patient's tolerance of                            previous anesthesia was also reviewed. The risks                            and benefits of the procedure and the sedation                            options and risks were discussed with the patient.                            All questions were answered, and informed consent                            was obtained. Prior Anticoagulants: The patient has                            taken no previous anticoagulant or antiplatelet                            agents. ASA Grade Assessment: II - A patient with  mild systemic disease. After reviewing the risks                            and benefits, the patient was deemed in                            satisfactory condition to undergo the procedure.                           - Prior to the procedure, a History and Physical                            was performed, and patient medications and                            allergies were  reviewed. The patient's tolerance of                            previous anesthesia was also reviewed. The risks                            and benefits of the procedure and the sedation                            options and risks were discussed with the patient.                            All questions were answered, and informed consent                            was obtained. Prior Anticoagulants: The patient has                            taken no previous anticoagulant or antiplatelet                            agents. After reviewing the risks and benefits, the                            patient was deemed in satisfactory condition to                            undergo the procedure.                           After obtaining informed consent, the colonoscope                            was passed under direct vision. Throughout the                            procedure, the patient's blood pressure, pulse, and  oxygen saturations were monitored continuously. The                            [id number] scope was introduced through                            the anus and advanced to the the cecum, identified                            by appendiceal orifice and ileocecal valve. The                            ileocecal valve, appendiceal orifice, and rectum                            were photographed. The colonoscopy was performed                            without difficulty. The patient tolerated the                            procedure well. The quality of the bowel                            preparation was adequate. Scope In: 1:53:49 PM Scope Out: 2:14:11 PM Scope Withdrawal Time: 0 hours 15 minutes 18 seconds  Total Procedure Duration: 0 hours 20 minutes 22 seconds  Findings:      The perianal and digital rectal examinations were normal.      Many medium-mouthed diverticula were found in the sigmoid colon. There       was narrowing of the colon  in association with the diverticular opening.      Four semi-pedunculated polyps were found in the hepatic flexure. The       polyps were 4 to 6 mm in size. (3) polyps were removed with a cold       snare; (1) with hot technique). Resection and retrieval were complete.       Estimated blood loss was minimal.      Two semi-pedunculated polyps were found in the ascending colon. The       polyps were 4 to 6 mm in size. These polyps were removed with a cold       snare. Resection and retrieval were complete. Estimated blood loss was       minimal.      The exam was otherwise without abnormality on direct and retroflexion       views. Impression:               - Mild diverticulosis in the sigmoid colon. There                            was narrowing of the colon in association with the                            diverticular opening.                           -  Four 4 to 6 mm polyps at the hepatic flexure,                            removed with a cold snare. Resected and retrieved.                           - Two 4 to 6 mm polyps in the ascending colon,                            removed with a hot snare. Resected and retrieved.                           - The examination was otherwise normal on direct                            and retroflexion views. Moderate Sedation:      Moderate (conscious) sedation was administered by the endoscopy nurse       and supervised by the endoscopist. Total physician intraservice time was       41 minutes. Recommendation:           - Patient has a contact number available for                            emergencies. The signs and symptoms of potential                            delayed complications were discussed with the                            patient. Return to normal activities tomorrow.                            Written discharge instructions were provided to the                            patient.                           - Resume previous  diet.                           - Continue present medications.                           - Await pathology results.                           - Repeat colonoscopy is recommended. The                            colonoscopy date will be determined after pathology                            results from today's exam become available for  review.                           - Return to GI office in 2 months. See EGD report Procedure Code(s):        --- Professional ---                           [phone], Colonoscopy, flexible; with removal of                            tumor(s), polyp(s), or other lesion(s) by snare                            technique                           99152, Moderate sedation services provided by the                            same physician or other qualified health care                            professional performing the diagnostic or                            therapeutic service that the sedation supports,                            requiring the presence of an independent trained                            observer to assist in the monitoring of the                            patient's level of consciousness and physiological                            status; initial 15 minutes of intraservice time,                            patient age 42 years or older                           [phone], Moderate sedation services; each additional                            15 minutes intraservice time                           99153, Moderate sedation services; each additional                            15 minutes intraservice time Diagnosis Code(s):        --- Professional ---  D12.3, Benign neoplasm of transverse colon (hepatic                            flexure or splenic flexure)                           D12.2, Benign neoplasm of ascending colon                           K57.30, Diverticulosis of large intestine without                             perforation or abscess without bleeding                           R93.3, Abnormal findings on diagnostic imaging of                            other parts of digestive tract CPT copyright 2016 American Medical Association. All rights reserved. The codes documented in this report are preliminary and upon coder review may  be revised to meet current compliance requirements. Cristopher Estimable. Rourk, MD Norvel Richards, MD 01/30/2016 2:36:28 PM This report has been signed electronically. Number of Addenda: 0

## 2016-02-02 ENCOUNTER — Encounter (HOSPITAL_COMMUNITY): Payer: Self-pay | Admitting: Internal Medicine

## 2016-02-02 ENCOUNTER — Encounter: Payer: Self-pay | Admitting: Internal Medicine

## 2016-02-07 ENCOUNTER — Encounter: Payer: Self-pay | Admitting: Internal Medicine

## 2016-03-26 ENCOUNTER — Ambulatory Visit (INDEPENDENT_AMBULATORY_CARE_PROVIDER_SITE_OTHER): Payer: Medicare Other | Admitting: Gastroenterology

## 2016-03-26 ENCOUNTER — Encounter: Payer: Self-pay | Admitting: Gastroenterology

## 2016-03-26 VITALS — BP 154/90 | HR 76 | Temp 97.8°F | Ht 63.0 in | Wt 140.0 lb

## 2016-03-26 DIAGNOSIS — K5792 Diverticulitis of intestine, part unspecified, without perforation or abscess without bleeding: Secondary | ICD-10-CM

## 2016-03-26 MED ORDER — METRONIDAZOLE 500 MG PO TABS: 500.0000 mg | ORAL_TABLET | Freq: Three times a day (TID) | ORAL | Status: DC

## 2016-03-26 MED ORDER — CIPROFLOXACIN HCL 500 MG PO TABS: 500.0000 mg | ORAL_TABLET | Freq: Two times a day (BID) | ORAL | Status: DC

## 2016-03-26 NOTE — Progress Notes (Signed)
cc'ed to pcp °

## 2016-03-26 NOTE — Assessment & Plan Note (Signed)
Recurrent left lower quadrant pain after colonoscopy, acute diverticulitis versus smoldering diverticulitis. Clinically stable. Discussed options with patient including reimaging at this point versus empirical treatment and reimaging only if symptoms are persistent. Discussed possibility of pain related to diverticular disease and narrowing of the colon in that region. Patient will like to try another course of Cipro and Flagyl which I think is reasonable. If she has persistent symptoms after treatment, we would consider CT to rule out complicated diverticulitis. She will eat a low-residue diet for now. As symptoms improve she can start reinitiating high-fiber diet. Patient call in 2 weeks with a progress report or sooner if needed.

## 2016-03-26 NOTE — Progress Notes (Signed)
      Primary Care Physician: Gwendolyn Grant, MD  Primary Gastroenterologist:  Garfield Cornea, MD   Chief Complaint  Patient presents with  . Follow-up    HPI: Dorothy Lee is a 75 y.o. female here For follow-up. We saw her back in March after recent episode of acute diverticulitis, CT documented in the ER back in February. Inflammation affected 8-10 m segment of the sigmoid colon. At the time of her office visit she noted that her left lower quadrant pain had essentially resolved and had no pain on most days.  Patient states ever since her colonoscopy she's been having increased left lower quadrant pain almost daily. Denies fever. She has short-lived relief of pain after bowel movement. Bowel movement every day, [place] stool 5. No melena rectal bleeding. Takes MiraLAX if she feels constipation but rarely has to take it. On a pain scale usually around 6-7 when she has the pain. Denies any upper GI symptoms.  History of pulmonary nodule on CT back in February. Patient is aware to follow up with Dr. Melvyn Novas to have further evaluation. She will make a phone call to him.    Current Outpatient Prescriptions  Medication Sig Dispense Refill  . aspirin EC 81 MG tablet Take 81 mg by mouth every morning.    . cetirizine (ZYRTEC) 10 MG tablet Take 10 mg by mouth at bedtime.    . CRESTOR 10 MG tablet TAKE ONE TABLET BY MOUTH AT BEDTIME 30 tablet 11  . latanoprost (XALATAN) 0.005 % ophthalmic solution Place 2 drops into both eyes at bedtime.    . nitroGLYCERIN (NITROSTAT) 0.4 MG SL tablet Place 0.4 mg under the tongue every 5 (five) minutes x 3 doses as needed for chest pain. May repeat x3    . pantoprazole (PROTONIX) 40 MG tablet Take 40 mg by mouth daily.    .         No current facility-administered medications for this visit.    Allergies as of 03/26/2016 - Review Complete 03/26/2016  Allergen Reaction Noted  . Crestor [rosuvastatin]  08/11/2013  . Lipitor [atorvastatin]  08/11/2013  .  Lisinopril Cough 02/16/2013  . Simvastatin  08/11/2013    ROS:  General: Negative for anorexia, weight loss, fever, chills, fatigue, weakness. ENT: Negative for hoarseness, difficulty swallowing , nasal congestion. CV: Negative for chest pain, angina, palpitations, dyspnea on exertion, peripheral edema.  Respiratory: Negative for dyspnea at rest, dyspnea on exertion, cough, sputum, wheezing.  GI: See history of present illness. GU:  Negative for dysuria, hematuria, urinary incontinence, urinary frequency, nocturnal urination.  Endo: Negative for unusual weight change.    Physical Examination:   BP 179/92 mmHg  Pulse 76  Temp(Src) 97.8 F (36.6 C)  Ht 5\' 3"  (1.6 m)  Wt 140 lb (63.504 kg)  BMI 24.81 kg/m2  General: Well-nourished, well-developed in no acute distress.  Eyes: No icterus. Mouth: Oropharyngeal mucosa moist and pink , no lesions erythema or exudate. Lungs: Clear to auscultation bilaterally.  Heart: Regular rate and rhythm, no murmurs rubs or gallops.  Abdomen: Bowel sounds are normal, LLQ tenderness, mild, nondistended, no hepatosplenomegaly or masses, no abdominal bruits or hernia , no rebound or guarding.   Extremities: No lower extremity edema. No clubbing or deformities. Neuro: Alert and oriented x 4   Skin: Warm and dry, no jaundice.   Psych: Alert and cooperative, normal mood and affect.

## 2016-03-26 NOTE — Patient Instructions (Signed)
Start cipro and flagyl for diverticulitis for 10 days. Call and let me know if your symptoms have resolved after treatment, OR call sooner if symptoms worsen.  Right now you need to eat a LOW fiber diet. Once you complete your antibiotics, you need to slowly increase your fiber in your diet.     Diverticulitis Diverticulitis is inflammation or infection of small pouches in your colon that form when you have a condition called diverticulosis. The pouches in your colon are called diverticula. Your colon, or large intestine, is where water is absorbed and stool is formed. Complications of diverticulitis can include:  Bleeding.  Severe infection.  Severe pain.  Perforation of your colon.  Obstruction of your colon. CAUSES  Diverticulitis is caused by bacteria. Diverticulitis happens when stool becomes trapped in diverticula. This allows bacteria to grow in the diverticula, which can lead to inflammation and infection. RISK FACTORS People with diverticulosis are at risk for diverticulitis. Eating a diet that does not include enough fiber from fruits and vegetables may make diverticulitis more likely to develop. SYMPTOMS  Symptoms of diverticulitis may include:  Abdominal pain and tenderness. The pain is normally located on the left side of the abdomen, but may occur in other areas.  Fever and chills.  Bloating.  Cramping.  Nausea.  Vomiting.  Constipation.  Diarrhea.  Blood in your stool. DIAGNOSIS  Your health care provider will ask you about your medical history and do a physical exam. You may need to have tests done because many medical conditions can cause the same symptoms as diverticulitis. Tests may include:  Blood tests.  Urine tests.  Imaging tests of the abdomen, including X-rays and CT scans. When your condition is under control, your health care provider may recommend that you have a colonoscopy. A colonoscopy can show how severe your diverticula are and  whether something else is causing your symptoms. TREATMENT  Most cases of diverticulitis are mild and can be treated at home. Treatment may include:  Taking over-the-counter pain medicines.  Following a clear liquid diet.  Taking antibiotic medicines by mouth for 7-10 days. More severe cases may be treated at a hospital. Treatment may include:  Not eating or drinking.  Taking prescription pain medicine.  Receiving antibiotic medicines through an IV tube.  Receiving fluids and nutrition through an IV tube.  Surgery. HOME CARE INSTRUCTIONS   Follow your health care provider's instructions carefully.  Follow a full liquid diet or other diet as directed by your health care provider. After your symptoms improve, your health care provider may tell you to change your diet. He or she may recommend you eat a high-fiber diet. Fruits and vegetables are good sources of fiber. Fiber makes it easier to pass stool.  Take fiber supplements or probiotics as directed by your health care provider.  Only take medicines as directed by your health care provider.  Keep all your follow-up appointments. SEEK MEDICAL CARE IF:   Your pain does not improve.  You have a hard time eating food.  Your bowel movements do not return to normal. SEEK IMMEDIATE MEDICAL CARE IF:   Your pain becomes worse.  Your symptoms do not get better.  Your symptoms suddenly get worse.  You have a fever.  You have repeated vomiting.  You have bloody or black, tarry stools. MAKE SURE YOU:   Understand these instructions.  Will watch your condition.  Will get help right away if you are not doing well or get worse.  This information is not intended to replace advice given to you by your health care provider. Make sure you discuss any questions you have with your health care provider.   Document Released: 07/04/2005 Document Revised: 09/29/2013 Document Reviewed: 08/19/2013 Elsevier Interactive Patient  Education [employer].

## 2016-03-27 DIAGNOSIS — E7800 Pure hypercholesterolemia, unspecified: Secondary | ICD-10-CM | POA: Diagnosis not present

## 2016-03-27 DIAGNOSIS — I10 Essential (primary) hypertension: Secondary | ICD-10-CM | POA: Diagnosis not present

## 2016-03-27 DIAGNOSIS — I2510 Atherosclerotic heart disease of native coronary artery without angina pectoris: Secondary | ICD-10-CM | POA: Diagnosis not present

## 2016-03-29 ENCOUNTER — Ambulatory Visit: Payer: Medicare Other | Admitting: Gastroenterology

## 2016-04-16 DIAGNOSIS — J320 Chronic maxillary sinusitis: Secondary | ICD-10-CM | POA: Diagnosis not present

## 2016-04-16 DIAGNOSIS — J322 Chronic ethmoidal sinusitis: Secondary | ICD-10-CM | POA: Diagnosis not present

## 2016-04-27 ENCOUNTER — Telehealth: Payer: Self-pay | Admitting: Internal Medicine

## 2016-04-27 NOTE — Telephone Encounter (Signed)
Patient took antibiotic and her stomach is still hurting.  Only had 2 days it didn't bother her.  [phone]  Was told to call if her symptoms continued.

## 2016-04-27 NOTE — Telephone Encounter (Signed)
Recommend CT A/P with contrast. Dx LLQ.

## 2016-04-27 NOTE — Telephone Encounter (Signed)
Called pt, she finished the abx that LSL gave her and she continues to have the L sided abd pain. No constipation, no diarrhea, no fever. She said she felt a little better for a couple of days but now is in pain again. Informed her per LSL ov note, she was wanting a ct if she didn't get any better. Pt said it was ok to schedule if that is what LSL wanted to do. (She wanted me to let LSL know that she is currently taking bactrim for a sinus infection that she started 2 days ago.)

## 2016-04-27 NOTE — Telephone Encounter (Signed)
Ginger, please schedule. Pt said it was ok to schedule.

## 2016-05-01 ENCOUNTER — Other Ambulatory Visit: Payer: Self-pay

## 2016-05-01 DIAGNOSIS — R1032 Left lower quadrant pain: Secondary | ICD-10-CM

## 2016-05-01 NOTE — Telephone Encounter (Signed)
LMOM to call back

## 2016-05-01 NOTE — Telephone Encounter (Signed)
She is aware 

## 2016-05-01 NOTE — Telephone Encounter (Signed)
Orders in and she is set up for 05/07/16 @ 8:00 am

## 2016-05-02 ENCOUNTER — Telehealth: Payer: Self-pay | Admitting: Internal Medicine

## 2016-05-02 NOTE — Telephone Encounter (Signed)
Pt has another procedure scheduled the same day as her CT on 7/31 and needs to cancel. She will call back to reschedule later.

## 2016-05-02 NOTE — Telephone Encounter (Signed)
NOTED

## 2016-05-07 ENCOUNTER — Ambulatory Visit (HOSPITAL_COMMUNITY): Admission: RE | Admit: 2016-05-07 | Payer: Medicare Other | Source: Ambulatory Visit

## 2016-05-15 DIAGNOSIS — H401122 Primary open-angle glaucoma, left eye, moderate stage: Secondary | ICD-10-CM | POA: Diagnosis not present

## 2016-05-15 DIAGNOSIS — H401111 Primary open-angle glaucoma, right eye, mild stage: Secondary | ICD-10-CM | POA: Diagnosis not present

## 2016-05-15 DIAGNOSIS — Z961 Presence of intraocular lens: Secondary | ICD-10-CM | POA: Diagnosis not present

## 2016-05-21 DIAGNOSIS — J322 Chronic ethmoidal sinusitis: Secondary | ICD-10-CM | POA: Diagnosis not present

## 2016-06-23 DIAGNOSIS — Z23 Encounter for immunization: Secondary | ICD-10-CM | POA: Diagnosis not present

## 2016-07-09 DIAGNOSIS — J324 Chronic pansinusitis: Secondary | ICD-10-CM | POA: Diagnosis not present

## 2016-07-09 DIAGNOSIS — J309 Allergic rhinitis, unspecified: Secondary | ICD-10-CM | POA: Diagnosis not present

## 2016-08-07 DIAGNOSIS — J309 Allergic rhinitis, unspecified: Secondary | ICD-10-CM | POA: Diagnosis not present

## 2016-08-07 DIAGNOSIS — H6122 Impacted cerumen, left ear: Secondary | ICD-10-CM | POA: Diagnosis not present

## 2016-08-07 DIAGNOSIS — K219 Gastro-esophageal reflux disease without esophagitis: Secondary | ICD-10-CM | POA: Diagnosis not present

## 2016-08-10 ENCOUNTER — Ambulatory Visit (INDEPENDENT_AMBULATORY_CARE_PROVIDER_SITE_OTHER): Payer: Medicare Other | Admitting: Gastroenterology

## 2016-08-10 ENCOUNTER — Encounter: Payer: Self-pay | Admitting: Gastroenterology

## 2016-08-10 DIAGNOSIS — R1032 Left lower quadrant pain: Secondary | ICD-10-CM | POA: Diagnosis not present

## 2016-08-10 LAB — CBC WITH DIFFERENTIAL/PLATELET
Basophils Absolute: 67 cells/uL (ref 0–200)
Basophils Relative: 1 %
EOS PCT: 4 %
Eosinophils Absolute: 268 cells/uL (ref 15–500)
HCT: 41.9 % (ref 35.0–45.0)
HEMOGLOBIN: 13.5 g/dL (ref 11.7–15.5)
LYMPHS ABS: 2144 {cells}/uL (ref 850–3900)
Lymphocytes Relative: 32 %
MCH: 29.4 pg (ref 27.0–33.0)
MCHC: 32.2 g/dL (ref 32.0–36.0)
MCV: 91.3 fL (ref 80.0–100.0)
MONO ABS: 670 {cells}/uL (ref 200–950)
MPV: 9.6 fL (ref 7.5–12.5)
Monocytes Relative: 10 %
NEUTROS ABS: 3551 {cells}/uL (ref 1500–7800)
Neutrophils Relative %: 53 %
Platelets: 312 10*3/uL (ref 140–400)
RBC: 4.59 MIL/uL (ref 3.80–5.10)
RDW: 12.9 % (ref 11.0–15.0)
WBC: 6.7 10*3/uL (ref 3.8–10.8)

## 2016-08-10 LAB — BASIC METABOLIC PANEL
BUN: 12 mg/dL (ref 7–25)
CHLORIDE: 104 mmol/L (ref 98–110)
CO2: 28 mmol/L (ref 20–31)
Calcium: 9.1 mg/dL (ref 8.6–10.4)
Creat: 0.88 mg/dL (ref 0.60–0.93)
GLUCOSE: 76 mg/dL (ref 65–99)
POTASSIUM: 4.2 mmol/L (ref 3.5–5.3)
SODIUM: 141 mmol/L (ref 135–146)

## 2016-08-10 NOTE — Patient Instructions (Signed)
1. Please have your labs done. CT scan as scheduled. We will contact you with results as available.

## 2016-08-10 NOTE — Progress Notes (Signed)
      Primary Care Physician: Gwendolyn Grant, MD  Primary Gastroenterologist:  Garfield Cornea, MD   Chief Complaint  Patient presents with  . Follow-up    HPI: Dorothy Lee is a 75 y.o. female here for follow-up. She was last seen in June. She has a history of diverticulitis. First episode ever in 11/2015 documented by CT. 8-10 cm segment of sigmoid colon with wall thickening and mild mesenteric stranding involving left adnexa.  We updated her colonoscopy April 2017 as outlined below. She continues to have intermittent LLQ pain. Not as bad as earlier this year. Some days does ok. Takes Miralax twice a week just to keep bowels moving. Denies constipation. No melena, brbpr. No UGI symptoms. Brother had colostomy but she is not sure why.     . COLONOSCOPY N/A 01/30/2016   Dr. Gala Romney: mild divertiuculosis in sigmoid colon. narrowing of the colon in association with the deverticular opening. 6 polyps removed, tubular adenoma. next TCS in 3 years    Current Outpatient Prescriptions  Medication Sig Dispense Refill  . aspirin EC 81 MG tablet Take 81 mg by mouth every morning.    Marland Kitchen CRESTOR 10 MG tablet TAKE ONE TABLET BY MOUTH AT BEDTIME 30 tablet 11  . latanoprost (XALATAN) 0.005 % ophthalmic solution Place 2 drops into both eyes at bedtime.    . nitroGLYCERIN (NITROSTAT) 0.4 MG SL tablet Place 0.4 mg under the tongue every 5 (five) minutes x 3 doses as needed for chest pain. May repeat x3    . pantoprazole (PROTONIX) 40 MG tablet Take 40 mg by mouth daily.     No current facility-administered medications for this visit.     Allergies as of 08/10/2016 - Review Complete 08/10/2016  Allergen Reaction Noted  . Crestor [rosuvastatin]  08/11/2013  . Lipitor [atorvastatin]  08/11/2013  . Lisinopril Cough 02/16/2013  . Simvastatin  08/11/2013    ROS:  General: Negative for anorexia, weight loss, fever, chills, fatigue, weakness. ENT: Negative for hoarseness, difficulty swallowing , nasal  congestion. CV: Negative for chest pain, angina, palpitations, dyspnea on exertion, peripheral edema.  Respiratory: Negative for dyspnea at rest, dyspnea on exertion, cough, sputum, wheezing.  GI: See history of present illness. GU:  Negative for dysuria, hematuria, urinary incontinence, urinary frequency, nocturnal urination.  Endo: Negative for unusual weight change.    Physical Examination:   BP (!) 157/83   Pulse 81   Temp 98.4 F (36.9 C) (Oral)   Ht 5\' 3"  (1.6 m)   Wt 141 lb 3.2 oz (64 kg)   BMI 25.01 kg/m   General: Well-nourished, well-developed in no acute distress.  Eyes: No icterus. Mouth: Oropharyngeal mucosa moist and pink , no lesions erythema or exudate. Lungs: Clear to auscultation bilaterally.  Heart: Regular rate and rhythm, no murmurs rubs or gallops.  Abdomen: Bowel sounds are normal, mild LLQ tenderness, nondistended, no hepatosplenomegaly or masses, no abdominal bruits or hernia , no rebound or guarding.   Extremities: No lower extremity edema. No clubbing or deformities. Neuro: Alert and oriented x 4   Skin: Warm and dry, no jaundice.   Psych: Alert and cooperative, normal mood and affect.   Imaging Studies: No results found.

## 2016-08-10 NOTE — Assessment & Plan Note (Signed)
75 y/o female with h/o sigmoid diverticulitis in 11/2015. She was treated empirically for another episode in 03/2016 with resolution of symptoms. Now presenting with intermittent LLQ pain. Recommend CT A/P with contrast for further evaluation. CBC, MET-7. Further recommendations to follow.

## 2016-08-13 ENCOUNTER — Telehealth: Payer: Self-pay

## 2016-08-13 NOTE — Progress Notes (Signed)
Please let patient know labs unremarkable. Await CT.

## 2016-08-13 NOTE — Progress Notes (Signed)
CC'D TO PCP °

## 2016-08-13 NOTE — Telephone Encounter (Signed)
Attempted to call pt again , no answer  

## 2016-08-13 NOTE — Telephone Encounter (Signed)
LMOM and informed of CT abd/pelvis scheduled at [facility] 08/17/16 at 8:00am, arrive 7:45am. Pick-up contrast before appt. NPO 4 hrs prior to test. Letter mailed.

## 2016-08-14 NOTE — Telephone Encounter (Signed)
Called pt, she got message re: CT scan and she picked up contrast this morning.

## 2016-08-17 ENCOUNTER — Ambulatory Visit (HOSPITAL_COMMUNITY)
Admission: RE | Admit: 2016-08-17 | Discharge: 2016-08-17 | Disposition: A | Discharge status: Home / Self Care | Payer: Medicare Other | Source: Ambulatory Visit | Attending: Gastroenterology | Admitting: Gastroenterology

## 2016-08-17 DIAGNOSIS — R911 Solitary pulmonary nodule: Secondary | ICD-10-CM | POA: Diagnosis not present

## 2016-08-17 DIAGNOSIS — K5730 Diverticulosis of large intestine without perforation or abscess without bleeding: Secondary | ICD-10-CM | POA: Diagnosis not present

## 2016-08-17 DIAGNOSIS — I700 Atherosclerosis of aorta: Secondary | ICD-10-CM | POA: Insufficient documentation

## 2016-08-17 DIAGNOSIS — R1032 Left lower quadrant pain: Secondary | ICD-10-CM | POA: Diagnosis not present

## 2016-08-17 MED ORDER — IOPAMIDOL (ISOVUE-300) INJECTION 61%
100.0000 mL | Freq: Once | INTRAVENOUS | Status: AC | PRN
  Administered 2016-08-17: 100 mL via INTRAVENOUS

## 2016-08-21 ENCOUNTER — Telehealth: Payer: Self-pay | Admitting: Internal Medicine

## 2016-08-21 DIAGNOSIS — L821 Other seborrheic keratosis: Secondary | ICD-10-CM | POA: Diagnosis not present

## 2016-08-21 DIAGNOSIS — L853 Xerosis cutis: Secondary | ICD-10-CM | POA: Diagnosis not present

## 2016-08-21 DIAGNOSIS — L814 Other melanin hyperpigmentation: Secondary | ICD-10-CM | POA: Diagnosis not present

## 2016-08-21 DIAGNOSIS — Z1283 Encounter for screening for malignant neoplasm of skin: Secondary | ICD-10-CM | POA: Diagnosis not present

## 2016-08-21 DIAGNOSIS — D1801 Hemangioma of skin and subcutaneous tissue: Secondary | ICD-10-CM | POA: Diagnosis not present

## 2016-08-21 DIAGNOSIS — M19012 Primary osteoarthritis, left shoulder: Secondary | ICD-10-CM | POA: Diagnosis not present

## 2016-08-21 NOTE — Telephone Encounter (Signed)
Pt had CT done Friday and calling for results today. I told her it could take 7-10 business days. Please call [phone]

## 2016-08-22 ENCOUNTER — Ambulatory Visit: Payer: Medicare Other | Admitting: Internal Medicine

## 2016-08-22 NOTE — Telephone Encounter (Signed)
See result note.  

## 2016-08-22 NOTE — Progress Notes (Signed)
Please let patient know that there is NO evidence of inflammation suspicious for diverticulitis. No evidence of stricture or obstruction. Question left lower quadrant pain related to noncompliance of the left colon scar tissue and/or old told diverticula in the region. Would recommend taking MiraLAX every night at bedtime if she does not have a good bowel movement. Currently she was taking a couple times per week that may need to take it more regularly. See if this helps evacuate her colon better and hopefully cut back on her pain. If not she should let us know.  Noted pulmonary nodule again. Make sure she has followed up with Dr. Melvyn Novas about this.   Reurn to see Dr. Gala Romney in 3 months.

## 2016-08-22 NOTE — Telephone Encounter (Signed)
Routing to LSL 

## 2016-09-11 DIAGNOSIS — Z853 Personal history of malignant neoplasm of breast: Secondary | ICD-10-CM | POA: Diagnosis not present

## 2016-09-11 DIAGNOSIS — Z0000 Encounter for general adult medical examination without abnormal findings: Secondary | ICD-10-CM | POA: Diagnosis not present

## 2016-09-11 DIAGNOSIS — Z23 Encounter for immunization: Secondary | ICD-10-CM | POA: Diagnosis not present

## 2016-09-11 DIAGNOSIS — I2510 Atherosclerotic heart disease of native coronary artery without angina pectoris: Secondary | ICD-10-CM | POA: Diagnosis not present

## 2016-09-14 DIAGNOSIS — I2510 Atherosclerotic heart disease of native coronary artery without angina pectoris: Secondary | ICD-10-CM | POA: Diagnosis not present

## 2016-09-19 ENCOUNTER — Ambulatory Visit: Payer: Medicare Other | Admitting: Internal Medicine

## 2016-09-24 DIAGNOSIS — J342 Deviated nasal septum: Secondary | ICD-10-CM | POA: Diagnosis not present

## 2016-09-28 DIAGNOSIS — I2510 Atherosclerotic heart disease of native coronary artery without angina pectoris: Secondary | ICD-10-CM | POA: Diagnosis not present

## 2016-09-28 DIAGNOSIS — I10 Essential (primary) hypertension: Secondary | ICD-10-CM | POA: Diagnosis not present

## 2016-09-28 DIAGNOSIS — E7800 Pure hypercholesterolemia, unspecified: Secondary | ICD-10-CM | POA: Diagnosis not present

## 2016-10-04 ENCOUNTER — Ambulatory Visit (INDEPENDENT_AMBULATORY_CARE_PROVIDER_SITE_OTHER): Payer: Medicare Other | Admitting: Internal Medicine

## 2016-10-04 ENCOUNTER — Ambulatory Visit (INDEPENDENT_AMBULATORY_CARE_PROVIDER_SITE_OTHER)
Admission: RE | Admit: 2016-10-04 | Discharge: 2016-10-04 | Disposition: A | Discharge status: Home / Self Care | Payer: Medicare Other | Source: Ambulatory Visit | Attending: Internal Medicine | Admitting: Internal Medicine

## 2016-10-04 ENCOUNTER — Encounter: Payer: Self-pay | Admitting: Internal Medicine

## 2016-10-04 VITALS — BP 112/76 | HR 83 | Ht 63.0 in | Wt 143.6 lb

## 2016-10-04 DIAGNOSIS — R058 Other specified cough: Secondary | ICD-10-CM

## 2016-10-04 DIAGNOSIS — R05 Cough: Secondary | ICD-10-CM

## 2016-10-04 DIAGNOSIS — J45991 Cough variant asthma: Secondary | ICD-10-CM

## 2016-10-04 DIAGNOSIS — R911 Solitary pulmonary nodule: Secondary | ICD-10-CM

## 2016-10-04 NOTE — Progress Notes (Signed)
Subjective:   Patient ID: Dorothy Lee, female    DOB: 1941-06-16    MRN: [id number]   Brief patient profile:  10 yowf never smoker with recurrent cough x 2011 responsive to prednisone for weeks at a time off steroids s the need for cough meds (during the day, still tessalon at hs)  referred for consultation by Dr Basilio Cairo having seen Dr Gwenette Greet previously with dx of pnds/cyclical coughing but requesting a second opinion as cough never resolved until started symbicort 80 in 11/2013 c/w cough variant asthma   History of Present Illness  08/24/2013 1st [facility] office visit/ [name]  Chief Complaint  Patient presents with  . Acute Visit    Pt states cough no better since last ov here in Sept. Cough is occ prod with foamy, white sputum.   no better with abx/ ent intervention/ always better with prednisone.  Presently finishing up round of abx/prednisone and "it's going away like it always does on prednisone" Tends to be worse when lies down with persistent sense of pnds which can disturb sleep as well but amount of mucus does not peak in ams and never purulent. No better/ worse on inhalers Assoc with urinary incont when severe rec Dexilant 60  Mg Take 30-60 min before first meal of the day  And at bedtime Pepcid 20 mg and chlortrimeton 4 mg until return Stop zyrtec  GERD (REFLUX)   For cough you can take the cough syrup or tramadol as needed  For flare return here with all medications in hand including all over the counters all inhalers    09/01/2013 Chief Complaint  Patient presents with  . Acute Visit    Cough with white and yellow mucus  Pt saw MW 11/17 and rec Arlyce Harman and cxr 11/2010 normal  Ct sinuses 05/2011: Acute and chronic sinusitis.  Full PFT's 11/2011: Normal  Dexilant 60 Mg Take 30-60 min before first meal of the day And at bedtime Pepcid 20 mg and chlortrimeton 4 mg until return  Stop zyrtec  Cough worse, now thick yellow. No real heartburn. No dyspnea.  Produces  mucus all the time.  rec Strict reflux diet Stay on Dexilant one daily and pepcid at night Prednisone 10mg  Take 4 for four days 3 for four days 2 for four days 1 for four days Augmentin twice daily for 10days Flonase two puff ea nostril Twice daily Stay on chlorpheniramine Follow cough protocol Stay off work ONE week, voice rest CT Sinus > declined       03/03/2014 f/u ov/[name] re: best in years, attributes to starting symbicort 80 2bid/ not using med calendar, easily confused with details of care.   Chief Complaint  Patient presents with  . Follow-up    Pt states that her cough has resolved. No new co's today.    Only symptom is constant runny nose, watery, not waking at night at all - not needing any cough suppression at all  rec Continue symbicort 80 Take 2 puffs first thing in am and then another 2 puffs about 12 hours later.  Work on inhaler technique:    Stop all acid suppression and chlortimeton at bedtime to see what difference this makes     11/08/2014 f/u ov/[name] re: chronic cough/ completely off symbiocrt x one month was on for cough variant asthma Chief Complaint  Patient presents with  . Acute Visit    pt c/o prod cough with yellow mucus, sinus congestion, sob when laying down X3 weeks.  symptom onset was gradual and w/in a week of stopping symbicort despite restarting gerd rx  rec Try dulera 100 Take 2 puffs first thing in am and then another 2 puffs about 12 hours later with all you medications and your drug formulary with your insurance company For cough use hydodan 1 tsp every 6 hours if needed      10/04/2016  f/u ov/[name] re:  Chronic cough x 2011 on protonix 40 mg ac  Daily / no longer on dulera  Chief Complaint  Patient presents with  . Pulmonary Consult    Self referral for pulmonary nodule. Pt has been seen here in the past for cough variant asthma. Her breathing is doing well and she denies any cough.   sinus surgery in [place] ? When >> did not help  urge to clear throat daytime > noct and no am collection of excess mucus      No obvious day to day or daytime variabilty or assoc excess/ purulent sputum or mucus plugs   doe ( works out at gym dine)  or cp or chest tightness, subjective wheeze overt sinus or hb symptoms. No unusual exp hx or h/o childhood pna/ asthma or knowledge of premature birth.  Sleeping ok without nocturnal  or early am exacerbation  of respiratory  c/o's or need for noct saba. Also denies any obvious fluctuation of symptoms with weather or environmental changes or other aggravating or alleviating factors except as outlined above   Current Medications, Allergies, Complete Past Medical History, Past Surgical History, Family History, and Social History were reviewed in [employer] record.  ROS  The following are not active complaints unless bolded sore throat, dysphagia, dental problems, itching, sneezing,  nasal congestion or excess/ purulent secretions, ear ache,   fever, chills, sweats, unintended wt loss, pleuritic or exertional cp, hemoptysis,  orthopnea pnd or leg swelling, presyncope, palpitations, heartburn, abdominal pain, anorexia, nausea, vomiting, diarrhea  or change in bowel or urinary habits, change in stools or urine, dysuria,hematuria,  rash, arthralgias, visual complaints, headache, numbness weakness or ataxia or problems with walking or coordination,  change in mood/affect or memory.                 Objective:   Physical Exam   03/03/2014 137 >  06/08/2014  137>  11/08/2014 144 > 10/04/2016   144   Pleasant amb wf nad / vital signs reviewd - Note on arrival 02 sats  95% on RA        HEENT: nl dentition, turbinates, and oropharynx. Nl external ear canals without cough reflex   NECK :  without JVD/Nodes/TM/ nl carotid upstrokes bilaterally   LUNGS: no acc muscle use, clear to A and P bilaterally without cough on insp or exp maneuvers   CV:  RRR  no s3 or murmur or increase in  P2, no edema   ABD:  soft and nontender with nl excursion in the supine position. No bruits or organomegaly, bowel sounds nl  MS:  warm without deformities, calf tenderness, cyanosis or clubbing  SKIN: warm and dry without lesions         CXR PA and Lateral:   10/04/2016 :    I personally reviewed images and agree with radiology impression as follows:   No acute cardiopulmonary process seen. Chronic elevation of the right hemidiaphragm.         Assessment & Plan:

## 2016-10-04 NOTE — Patient Instructions (Addendum)
We will call you to set up a ct chest 1st week in June 2018   Please remember to go to the  x-ray department downstairs for your tests - we will call you with the results when they are available.

## 2016-10-05 ENCOUNTER — Telehealth: Payer: Self-pay | Admitting: Internal Medicine

## 2016-10-05 ENCOUNTER — Encounter: Payer: Self-pay | Admitting: Internal Medicine

## 2016-10-05 ENCOUNTER — Other Ambulatory Visit: Payer: Self-pay | Admitting: Cardiology

## 2016-10-05 DIAGNOSIS — R911 Solitary pulmonary nodule: Secondary | ICD-10-CM | POA: Insufficient documentation

## 2016-10-05 DIAGNOSIS — M19012 Primary osteoarthritis, left shoulder: Secondary | ICD-10-CM | POA: Diagnosis not present

## 2016-10-05 NOTE — Assessment & Plan Note (Signed)
Arlyce Harman and cxr 11/2010 normal Ct sinuses 05/2011:  Acute and chronic sinusitis. Full PFT's 11/2011:  Normal  CT sinuses 11/2012:  Thickening of sinuses without significant fluid.  Followed by ENT Wilburn Cornelia) - Rec repeat CT 09/02/13 by Dr Joya Gaskins > declined  - MCT rec 09/22/2013 > not done  -Med Calendar 12/15/2013  - Allergy profile 01/19/14 >  Eos 12.8% and IgE 249 with Pos Rast for trees, grasses ragweeds does not correlate with cough  - 2016 sinus surgery in [place] (pt unclear on date ) did not help sense of pnds   Upper airway cough syndrome (previously labeled PNDS) , is  so named because it's frequently impossible to sort out how much is  CR/sinusitis with freq throat clearing (which can be related to primary GERD)   vs  causing  secondary (" extra esophageal")  GERD from wide swings in gastric pressure that occur with throat clearing, often  promoting self use of mint and menthol lozenges that reduce the lower esophageal sphincter tone and exacerbate the problem further in a cyclical fashion.   These are the same pts (now being labeled as having "irritable larynx syndrome" by some cough centers) who not infrequently have a history of having failed to tolerate ace inhibitors,  dry powder inhalers or biphosphonates or report having atypical/extraesophageal reflux symptoms that don't respond to standard doses of PPI  and are easily confused as having aecopd or asthma flares by even experienced allergists/ pulmonologists (myself included).   This clearly fits best with irritable larynx syndrome with possible secondary gerd (from vigorous throat clearing) based on absence of noct symptoms and chronicity (now 6 years)   rec consider gabapentin trial vs refer to Dr Joya Gaskins [facility]/ no change gerd rx

## 2016-10-05 NOTE — Assessment & Plan Note (Signed)
See CT abd 08/17/16 x 4 mm LLL with nothing else viz on plain cxr 10/04/2016   CT results reviewed with pt >>> Too small for PET or bx, not suspicious enough for excisional bx > really only option for now is follow the Fleischner society guidelines but note we actually don't have the entire chest viz on the abd ct so that needs to be done to complete the w/u  Discussed in detail all the  indications, usual  risks and alternatives  relative to the benefits with patient who agrees to proceed with conservative f/u with CT chest s contrast in 6 months, entire chest, and then if all that we see is the single 4 mm lesion then no further studies needed.  I had an extended discussion with the patient reviewing all relevant studies completed to date and  lasting 25 minutes of a 40  minute re-establish  visit  re new problem of SPN and refractory chronic  non-specific but potentially very serious pulmonary symptoms of unknown etiology (cough)  Each maintenance medication was reviewed in detail including most importantly the difference between maintenance and prns and under what circumstances the prns are to be triggered using an action plan format that is not reflected in the computer generated alphabetically organized AVS.    Please see AVS for unique instructions that I personally wrote and verbalized to the the pt in detail and then reviewed with pt  by my nurse highlighting any  changes in therapy recommended at today's visit to their plan of care.

## 2016-10-05 NOTE — Telephone Encounter (Signed)
Call pt: Reviewed cxr and no acute change so no change in recommendations made at [facility] and spoke with pt and she is aware of results per MW> nothing further is needed.

## 2016-10-05 NOTE — Assessment & Plan Note (Signed)
No evidence of active asthma oof all maint rx

## 2016-10-10 ENCOUNTER — Encounter: Payer: Self-pay | Admitting: Internal Medicine

## 2016-10-30 ENCOUNTER — Encounter (HOSPITAL_COMMUNITY): Payer: Self-pay

## 2016-10-30 ENCOUNTER — Emergency Department (HOSPITAL_COMMUNITY)
Admission: EM | Admit: 2016-10-30 | Discharge: 2016-10-30 | Disposition: A | Discharge status: Home / Self Care | Payer: Medicare Other | Attending: Emergency Medicine | Admitting: Emergency Medicine

## 2016-10-30 ENCOUNTER — Emergency Department (HOSPITAL_COMMUNITY): Payer: Medicare Other

## 2016-10-30 DIAGNOSIS — I2510 Atherosclerotic heart disease of native coronary artery without angina pectoris: Secondary | ICD-10-CM | POA: Insufficient documentation

## 2016-10-30 DIAGNOSIS — J111 Influenza due to unidentified influenza virus with other respiratory manifestations: Secondary | ICD-10-CM | POA: Diagnosis not present

## 2016-10-30 DIAGNOSIS — Z853 Personal history of malignant neoplasm of breast: Secondary | ICD-10-CM | POA: Insufficient documentation

## 2016-10-30 DIAGNOSIS — R05 Cough: Secondary | ICD-10-CM | POA: Diagnosis not present

## 2016-10-30 DIAGNOSIS — Z7982 Long term (current) use of aspirin: Secondary | ICD-10-CM | POA: Diagnosis not present

## 2016-10-30 DIAGNOSIS — I10 Essential (primary) hypertension: Secondary | ICD-10-CM | POA: Diagnosis not present

## 2016-10-30 DIAGNOSIS — Z79899 Other long term (current) drug therapy: Secondary | ICD-10-CM | POA: Insufficient documentation

## 2016-10-30 LAB — URINALYSIS, ROUTINE W REFLEX MICROSCOPIC
Bilirubin Urine: NEGATIVE
GLUCOSE, UA: NEGATIVE mg/dL
HGB URINE DIPSTICK: NEGATIVE
Ketones, ur: 20 mg/dL — AB
Leukocytes, UA: NEGATIVE
Nitrite: NEGATIVE
PROTEIN: NEGATIVE mg/dL
SPECIFIC GRAVITY, URINE: 1.023 (ref 1.005–1.030)
pH: 5 (ref 5.0–8.0)

## 2016-10-30 LAB — CBC WITH DIFFERENTIAL/PLATELET
Basophils Absolute: 0 10*3/uL (ref 0.0–0.1)
Basophils Relative: 0 %
EOS PCT: 0 %
Eosinophils Absolute: 0 10*3/uL (ref 0.0–0.7)
HCT: 44.9 % (ref 36.0–46.0)
Hemoglobin: 14.3 g/dL (ref 12.0–15.0)
LYMPHS ABS: 0.9 10*3/uL (ref 0.7–4.0)
Lymphocytes Relative: 18 %
MCH: 29.5 pg (ref 26.0–34.0)
MCHC: 31.8 g/dL (ref 30.0–36.0)
MCV: 92.6 fL (ref 78.0–100.0)
MONOS PCT: 15 %
Monocytes Absolute: 0.8 10*3/uL (ref 0.1–1.0)
Neutro Abs: 3.5 10*3/uL (ref 1.7–7.7)
Neutrophils Relative %: 67 %
PLATELETS: 219 10*3/uL (ref 150–400)
RBC: 4.85 MIL/uL (ref 3.87–5.11)
RDW: 13.9 % (ref 11.5–15.5)
WBC: 5.2 10*3/uL (ref 4.0–10.5)

## 2016-10-30 LAB — BASIC METABOLIC PANEL
Anion gap: 10 (ref 5–15)
BUN: 16 mg/dL (ref 6–20)
CHLORIDE: 102 mmol/L (ref 101–111)
CO2: 26 mmol/L (ref 22–32)
CREATININE: 1.09 mg/dL — AB (ref 0.44–1.00)
Calcium: 8.7 mg/dL — ABNORMAL LOW (ref 8.9–10.3)
GFR calc non Af Amer: 48 mL/min — ABNORMAL LOW (ref >60)
GFR, EST AFRICAN AMERICAN: 56 mL/min — AB (ref >60)
GLUCOSE: 91 mg/dL (ref 65–99)
Potassium: 3.8 mmol/L (ref 3.5–5.1)
Sodium: 138 mmol/L (ref 135–145)

## 2016-10-30 LAB — INFLUENZA PANEL BY PCR (TYPE A & B)
INFLAPCR: POSITIVE — AB
Influenza B By PCR: NEGATIVE

## 2016-10-30 LAB — LACTIC ACID, PLASMA: Lactic Acid, Venous: 1.4 mmol/L (ref 0.5–1.9)

## 2016-10-30 MED ORDER — SODIUM CHLORIDE 0.9 % IV BOLUS (SEPSIS)
1000.0000 mL | Freq: Once | INTRAVENOUS | Status: AC
  Administered 2016-10-30: 1000 mL via INTRAVENOUS

## 2016-10-30 MED ORDER — IPRATROPIUM-ALBUTEROL 0.5-2.5 (3) MG/3ML IN SOLN
3.0000 mL | Freq: Once | RESPIRATORY_TRACT | Status: AC
  Administered 2016-10-30: 3 mL via RESPIRATORY_TRACT
  Filled 2016-10-30: qty 3

## 2016-10-30 MED ORDER — OSELTAMIVIR PHOSPHATE 75 MG PO CAPS: 75.0000 mg | ORAL_CAPSULE | Freq: Two times a day (BID) | ORAL | 0 refills | Status: DC

## 2016-10-30 MED ORDER — BENZONATATE 100 MG PO CAPS: 100.0000 mg | ORAL_CAPSULE | Freq: Three times a day (TID) | ORAL | 0 refills | Status: DC | PRN

## 2016-10-30 NOTE — ED Notes (Signed)
Patient given discharge instruction, verbalized understand. IV removed, band aid applied. Patient ambulatory out of the department.  

## 2016-10-30 NOTE — ED Provider Notes (Signed)
[facility] DEPT Provider Note   CSN: [id number] Arrival date & time: 10/30/16  1041     History   Chief Complaint Chief Complaint  Patient presents with  . Generalized Body Aches    HPI Dorothy Lee is a 76 y.o. female.  HPI  Pt was seen at 1315.  Per pt, c/o gradual onset and persistence of constant sinus congestion, and cough for the past 3 days.  Has been associated with generalized body aches/fatigue. Denies fevers, no rash, no CP/SOB, no N/V/D, no abd pain, no back pain.     Past Medical History:  Diagnosis Date  . Allergic rhinitis   . ALLERGIC RHINITIS   . CAD (coronary artery disease)    DES RCA and LAD 02/2008, cath 11/2012 patent RCA and LAD stents with new mild LAD stenosis s/p DES  . Cough    chronic  . Diverticulosis   . GERD (gastroesophageal reflux disease)   . Glaucoma   . History of breast cancer    right - s/p XRT 1984, surg, no chemo  . Hyperlipidemia   . HYPERTENSION, BORDERLINE   . ROTATOR CUFF SYNDROME, LEFT   . Scoliosis    with mild chronic LBP  . Urinary incontinence     Patient Active Problem List   Diagnosis Date Noted  . Solitary pulmonary nodule 10/05/2016  . LLQ pain 08/10/2016  . History of colonic polyps   . Diverticulosis of colon without hemorrhage   . Dysphagia   . Esophageal dysphagia 01/05/2016  . Acute diverticulitis 01/05/2016  . Postoperative breast asymmetry 06/02/2015  . Cough variant asthma 01/19/2014  . Weight loss, unintentional 09/29/2013  . Upper airway cough syndrome 11/20/2010  . Chronic sinusitis 11/08/2010  . ALLERGIC RHINITIS 11/08/2010  . PALPITATIONS, HX OF 10/05/2010  . ROTATOR CUFF SYNDROME, LEFT 09/25/2010  . GERD 09/22/2010  . URINARY INCONTINENCE 09/22/2010  . Dyslipidemia 09/15/2010  . Essential hypertension 09/15/2010  . Coronary atherosclerosis 09/15/2010  . BREAST CANCER, HX OF 09/15/2010    Past Surgical History:  Procedure Laterality Date  . APPENDECTOMY  1981  . BREAST BIOPSY   1984  . BREAST LUMPECTOMY  1982   right breast  . COLONOSCOPY  2008   Dr. Oletta Lamas: diverticulosis, hemorrhoids, conscious sedation  . COLONOSCOPY N/A 01/30/2016   Dr. Gala Romney: mild divertiuculosis in sigmoid colon. narrowing of the colon in association with the deverticular opening. 6 polyps removed, tubular adenoma. next TCS in 3 years  . CORONARY ANGIOPLASTY WITH STENT PLACEMENT  02/2008  . ESOPHAGOGASTRODUODENOSCOPY  2014   Dr. Fuller Plan: erosive gastritis, no h.pylori  . ESOPHAGOGASTRODUODENOSCOPY N/A 01/30/2016   Dr. Gala Romney: normal esophagus s/p dilation, small hh.  Marland Kitchen LEFT HEART CATHETERIZATION WITH CORONARY ANGIOGRAM N/A 12/02/2012   Procedure: LEFT HEART CATHETERIZATION WITH CORONARY ANGIOGRAM;  Surgeon: Jettie Booze, MD;  Location: [facility] C[facility] LAB;  Service: Cardiovascular;  Laterality: N/A;  . Venia Minks DILATION N/A 01/30/2016   Procedure: Venia Minks DILATION;  Surgeon: Daneil Dolin, MD;  Location: [facility] ENDO SUITE;  Service: Endoscopy;  Laterality: N/A;  . NASAL SINUS SURGERY    . PERCUTANEOUS CORONARY STENT INTERVENTION (PCI-S)  12/02/2012   Procedure: PERCUTANEOUS CORONARY STENT INTERVENTION (PCI-S);  Surgeon: Jettie Booze, MD;  Location: [facility] CATH LAB;  Service: Cardiovascular;;  DES to the MID LAD    OB History    No data available       Home Medications    Prior to Admission medications   Medication Sig Start Date  End Date Taking? Authorizing Provider  aspirin EC 81 MG tablet Take 81 mg by mouth every morning.   Yes Historical Provider, MD  latanoprost (XALATAN) 0.005 % ophthalmic solution Place 2 drops into both eyes at bedtime. 07/28/14  Yes Historical Provider, MD  Multiple Vitamins-Minerals (PRESERVISION AREDS PO) Take 1 capsule by mouth daily.   Yes Historical Provider, MD  pantoprazole (PROTONIX) 40 MG tablet Take 40 mg by mouth daily.   Yes Historical Provider, MD  rosuvastatin (CRESTOR) 10 MG tablet Take 1 tablet (10 mg total) by mouth daily. OVERDUE FOR FOLLOW UP. CALL  AND SCHEDULE [phone] 10/05/16  Yes Sueanne Margarita, MD  nitroGLYCERIN (NITROSTAT) 0.4 MG SL tablet Place 0.4 mg under the tongue every 5 (five) minutes x 3 doses as needed for chest pain. May repeat x3    Historical Provider, MD    Family History Family History  Problem Relation Age of Onset  . Emphysema Father   . Emphysema Brother   . Heart disease Brother     x3 brothers  . Lung cancer Brother   . Throat cancer Brother     head/neck cancer  . Colon cancer Neg Hx     Social History Social History  Substance Use Topics  . Smoking status: Never Smoker  . Smokeless tobacco: Never Used     Comment: Widowed, lives alone but has sig other.   . Alcohol use No     Comment: social alcohol > wine 2x week     Allergies   Crestor [rosuvastatin]; Lipitor [atorvastatin]; Lisinopril; and Simvastatin   Review of Systems Review of Systems ROS: Statement: All systems negative except as marked or noted in the HPI; Constitutional: Negative for fever and chills. +generalized weakness/fatigue.; ; Eyes: Negative for eye pain, redness and discharge. ; ; ENMT: Negative for ear pain, hoarseness, sore throat. +nasal congestion, sinus pressure. ; ; Cardiovascular: Negative for chest pain, palpitations, diaphoresis, dyspnea and peripheral edema. ; ; Respiratory: +cough. Negative for wheezing and stridor. ; ; Gastrointestinal: Negative for nausea, vomiting, diarrhea, abdominal pain, blood in stool, hematemesis, jaundice and rectal bleeding. . ; ; Genitourinary: Negative for dysuria, flank pain and hematuria. ; ; Musculoskeletal: Negative for back pain and neck pain. Negative for swelling and trauma.; ; Skin: Negative for pruritus, rash, abrasions, blisters, bruising and skin lesion.; ; Neuro: Negative for headache, lightheadedness and neck stiffness. Negative for altered level of consciousness, altered mental status, extremity weakness, paresthesias, involuntary movement, seizure and syncope.        Physical Exam Updated Vital Signs BP 136/63   Pulse 96   Temp 99.5 F (37.5 C)   Resp (!) 107   Ht 5\' 3"  (1.6 m)   Wt 138 lb (62.6 kg)   SpO2 97%   BMI 24.45 kg/m    13:26:25 Orthostatic Vital Signs RC  Orthostatic Lying   BP- Lying: 122/58  Pulse- Lying: 96      Orthostatic Sitting  BP- Sitting: 124/60  Pulse- Sitting: 101      Orthostatic Standing at 0 minutes  BP- Standing at 0 minutes: 109/61  Pulse- Standing at 0 minutes: 105   Patient Vitals for the past 24 hrs:  BP Temp Temp src Pulse Resp SpO2 Height Weight  10/30/16 1630 136/66 99 F (37.2 C) Oral 90 - 93 % - -  10/30/16 1600 131/60 - - 93 - 94 % - -  10/30/16 1531 137/60 - - - - - - -  10/30/16 1500 - - -  95 - 96 % - -  10/30/16 1434 133/62 - - - - - - -  10/30/16 1400 135/55 - - - - - - -  10/30/16 1340 - - - - - 97 % - -  10/30/16 1330 123/68 - - - - - - -  10/30/16 1230 125/67 - - 95 - 94 % - -  10/30/16 1200 136/63 - - 96 - 97 % - -  10/30/16 1116 145/72 99.5 F (37.5 C) - 110 (!) 107 97 % 5\' 3"  (1.6 m) 138 lb (62.6 kg)     Physical Exam 1320: Physical examination:  Nursing notes reviewed; Vital signs and O2 SAT reviewed;  Constitutional: Well developed, Well nourished, Well hydrated, In no acute distress; Head:  Normocephalic, atraumatic; Eyes: EOMI, PERRL, No scleral icterus; ENMT: TM's clear bilat. +edemetous nasal turbinates bilat with clear rhinorrhea. Mouth and pharynx without lesions. No tonsillar exudates. No intra-oral edema. No submandibular or sublingual edema. No hoarse voice, no drooling, no stridor. No pain with manipulation of larynx. No trismus. Mouth and pharynx normal, Mucous membranes moist; Neck: Supple, Full range of motion, No lymphadenopathy; Cardiovascular: Regular rate and rhythm, No gallop; Respiratory: Breath sounds clear & equal bilaterally, No wheezes.  Speaking full sentences with ease, Normal respiratory effort/excursion; Chest: Nontender, Movement normal;  Abdomen: Soft, Nontender, Nondistended, Normal bowel sounds; Genitourinary: No CVA tenderness; Extremities: Pulses normal, No tenderness, No edema, No calf edema or asymmetry.; Neuro: AA&Ox3, Major CN grossly intact.  Speech clear. No gross focal motor deficits in extremities.; Skin: Color normal, Warm, Dry.   ED Treatments / Results  Labs (all labs ordered are listed, but only abnormal results are displayed)   EKG  EKG Interpretation None       Radiology   Procedures Procedures (including critical care time)  Medications Ordered in ED Medications  ipratropium-albuterol (DUONEB) 0.5-2.5 (3) MG/3ML nebulizer solution 3 mL (not administered)  sodium chloride 0.9 % bolus 1,000 mL (not administered)     Initial Impression / Assessment and Plan / ED Course  I have reviewed the triage vital signs and the nursing notes.  Pertinent labs & imaging results that were available during my care of the patient were reviewed by me and considered in my medical decision making (see chart for details).  MDM Reviewed: previous chart, nursing note and vitals Reviewed previous: labs Interpretation: labs and x-ray   Results for orders placed or performed during the hospital encounter of [id number]  Basic metabolic panel  Result Value Ref Range   Sodium 138 135 - 145 mmol/L   Potassium 3.8 3.5 - 5.1 mmol/L   Chloride 102 101 - 111 mmol/L   CO2 26 22 - 32 mmol/L   Glucose, Bld 91 65 - 99 mg/dL   BUN 16 6 - 20 mg/dL   Creatinine, Ser 1.09 (H) 0.44 - 1.00 mg/dL   Calcium 8.7 (L) 8.9 - 10.3 mg/dL   GFR calc non Af Amer 48 (L) >60 mL/min   GFR calc Af Amer 56 (L) >60 mL/min   Anion gap 10 5 - 15  Lactic acid, plasma  Result Value Ref Range   Lactic Acid, Venous 1.4 0.5 - 1.9 mmol/L  CBC with Differential  Result Value Ref Range   WBC 5.2 4.0 - 10.5 K/uL   RBC 4.85 3.87 - 5.11 MIL/uL   Hemoglobin 14.3 12.0 - 15.0 g/dL   HCT 44.9 36.0 - 46.0 %   MCV 92.6 78.0 - 100.0 fL   MCH  29.5 26.0  - 34.0 pg   MCHC 31.8 30.0 - 36.0 g/dL   RDW 13.9 11.5 - 15.5 %   Platelets 219 150 - 400 K/uL   Neutrophils Relative % 67 %   Neutro Abs 3.5 1.7 - 7.7 K/uL   Lymphocytes Relative 18 %   Lymphs Abs 0.9 0.7 - 4.0 K/uL   Monocytes Relative 15 %   Monocytes Absolute 0.8 0.1 - 1.0 K/uL   Eosinophils Relative 0 %   Eosinophils Absolute 0.0 0.0 - 0.7 K/uL   Basophils Relative 0 %   Basophils Absolute 0.0 0.0 - 0.1 K/uL  Urinalysis, Routine w reflex microscopic  Result Value Ref Range   Color, Urine YELLOW YELLOW   APPearance CLEAR CLEAR   Specific Gravity, Urine 1.023 1.005 - 1.030   pH 5.0 5.0 - 8.0   Glucose, UA NEGATIVE NEGATIVE mg/dL   Hgb urine dipstick NEGATIVE NEGATIVE   Bilirubin Urine NEGATIVE NEGATIVE   Ketones, ur 20 (A) NEGATIVE mg/dL   Protein, ur NEGATIVE NEGATIVE mg/dL   Nitrite NEGATIVE NEGATIVE   Leukocytes, UA NEGATIVE NEGATIVE  Influenza panel by PCR (type A & B)  Result Value Ref Range   Influenza A By PCR POSITIVE (A) NEGATIVE   Influenza B By PCR NEGATIVE NEGATIVE   Dg Chest 2 View Result Date: 10/30/2016 CLINICAL DATA:  Body aches, cough and chills for 3 days. Low-grade fever this morning. EXAM: CHEST  2 VIEW COMPARISON:  PA and lateral chest 10/04/2016 and 11/08/2014. FINDINGS: The lungs are clear. Heart size is normal. Atherosclerotic vascular disease is noted. No pneumothorax or pleural effusion. Eventration right hemidiaphragm is unchanged. The patient is status post right axillary dissection. IMPRESSION: No acute disease. Atherosclerosis. Electronically Signed   By: Inge Rise M.D.   On: 10/30/2016 13:24    1640:  Mild orthostasis on VS; IVF bolus given. VSS, remains afebrile. Short neb given with improvement in cough. Pt states she has albuterol MDI at home. Will rx tessalon for cough, tamiflu for flu A. Dx and testing d/w pt and family.  Questions answered.  Verb understanding, agreeable to d/c home with outpt f/u.   Final Clinical Impressions(s) /  ED Diagnoses   Final diagnoses:  None    New Prescriptions New Prescriptions   No medications on file     Francine Graven, DO 11/02/16 1555

## 2016-10-30 NOTE — ED Notes (Signed)
Pt she has coughed a lot, been in bed for 3 days, weak, aching

## 2016-10-30 NOTE — ED Notes (Signed)
MD at the bedside  

## 2016-10-30 NOTE — ED Notes (Signed)
Pt has congested croupy cough, states yellow sputum

## 2016-10-30 NOTE — ED Triage Notes (Addendum)
Pt reports body aches and coughing for 3 days. States low grade temp this am. Also reports HA. Denies N/V/D. Also reports upper epigastric pain

## 2016-10-30 NOTE — ED Notes (Signed)
Lab at the bedside 

## 2016-10-30 NOTE — Discharge Instructions (Signed)
Take the prescriptions as directed.  Use your albuterol inhaler (2 to 4 puffs) every 4 hours for the next 7 days, then as needed for cough, wheezing, or shortness of breath.  Call your regular medical doctor tomorrow morning to schedule a follow up appointment within the next 3 days.  Return to the Emergency Department immediately sooner if worsening.  ° °

## 2016-10-30 NOTE — ED Notes (Signed)
Called RT

## 2016-11-01 LAB — URINE CULTURE

## 2016-11-16 DIAGNOSIS — Z0000 Encounter for general adult medical examination without abnormal findings: Secondary | ICD-10-CM | POA: Diagnosis not present

## 2016-11-16 DIAGNOSIS — E7800 Pure hypercholesterolemia, unspecified: Secondary | ICD-10-CM | POA: Diagnosis not present

## 2016-11-16 DIAGNOSIS — I25119 Atherosclerotic heart disease of native coronary artery with unspecified angina pectoris: Secondary | ICD-10-CM | POA: Diagnosis not present

## 2016-11-16 DIAGNOSIS — I10 Essential (primary) hypertension: Secondary | ICD-10-CM | POA: Diagnosis not present

## 2016-12-06 DIAGNOSIS — M9901 Segmental and somatic dysfunction of cervical region: Secondary | ICD-10-CM | POA: Diagnosis not present

## 2016-12-06 DIAGNOSIS — M9902 Segmental and somatic dysfunction of thoracic region: Secondary | ICD-10-CM | POA: Diagnosis not present

## 2016-12-06 DIAGNOSIS — M9903 Segmental and somatic dysfunction of lumbar region: Secondary | ICD-10-CM | POA: Diagnosis not present

## 2016-12-06 DIAGNOSIS — S43491A Other sprain of right shoulder joint, initial encounter: Secondary | ICD-10-CM | POA: Diagnosis not present

## 2016-12-07 ENCOUNTER — Ambulatory Visit: Payer: Medicare Other | Admitting: Internal Medicine

## 2016-12-11 DIAGNOSIS — S43491A Other sprain of right shoulder joint, initial encounter: Secondary | ICD-10-CM | POA: Diagnosis not present

## 2016-12-11 DIAGNOSIS — M9901 Segmental and somatic dysfunction of cervical region: Secondary | ICD-10-CM | POA: Diagnosis not present

## 2016-12-11 DIAGNOSIS — M9903 Segmental and somatic dysfunction of lumbar region: Secondary | ICD-10-CM | POA: Diagnosis not present

## 2016-12-11 DIAGNOSIS — M9902 Segmental and somatic dysfunction of thoracic region: Secondary | ICD-10-CM | POA: Diagnosis not present

## 2016-12-14 DIAGNOSIS — M9901 Segmental and somatic dysfunction of cervical region: Secondary | ICD-10-CM | POA: Diagnosis not present

## 2016-12-14 DIAGNOSIS — S43491A Other sprain of right shoulder joint, initial encounter: Secondary | ICD-10-CM | POA: Diagnosis not present

## 2016-12-14 DIAGNOSIS — M9903 Segmental and somatic dysfunction of lumbar region: Secondary | ICD-10-CM | POA: Diagnosis not present

## 2016-12-14 DIAGNOSIS — M9902 Segmental and somatic dysfunction of thoracic region: Secondary | ICD-10-CM | POA: Diagnosis not present

## 2016-12-18 DIAGNOSIS — M9901 Segmental and somatic dysfunction of cervical region: Secondary | ICD-10-CM | POA: Diagnosis not present

## 2016-12-18 DIAGNOSIS — M9902 Segmental and somatic dysfunction of thoracic region: Secondary | ICD-10-CM | POA: Diagnosis not present

## 2016-12-18 DIAGNOSIS — S43492A Other sprain of left shoulder joint, initial encounter: Secondary | ICD-10-CM | POA: Diagnosis not present

## 2016-12-18 DIAGNOSIS — M9903 Segmental and somatic dysfunction of lumbar region: Secondary | ICD-10-CM | POA: Diagnosis not present

## 2016-12-25 DIAGNOSIS — H401122 Primary open-angle glaucoma, left eye, moderate stage: Secondary | ICD-10-CM | POA: Diagnosis not present

## 2016-12-25 DIAGNOSIS — H04123 Dry eye syndrome of bilateral lacrimal glands: Secondary | ICD-10-CM | POA: Diagnosis not present

## 2016-12-25 DIAGNOSIS — H401111 Primary open-angle glaucoma, right eye, mild stage: Secondary | ICD-10-CM | POA: Diagnosis not present

## 2016-12-28 ENCOUNTER — Ambulatory Visit: Payer: Medicare Other | Admitting: Internal Medicine

## 2016-12-28 DIAGNOSIS — M9903 Segmental and somatic dysfunction of lumbar region: Secondary | ICD-10-CM | POA: Diagnosis not present

## 2016-12-28 DIAGNOSIS — M9902 Segmental and somatic dysfunction of thoracic region: Secondary | ICD-10-CM | POA: Diagnosis not present

## 2016-12-28 DIAGNOSIS — M9901 Segmental and somatic dysfunction of cervical region: Secondary | ICD-10-CM | POA: Diagnosis not present

## 2016-12-28 DIAGNOSIS — S43491A Other sprain of right shoulder joint, initial encounter: Secondary | ICD-10-CM | POA: Diagnosis not present

## 2017-01-04 DIAGNOSIS — I889 Nonspecific lymphadenitis, unspecified: Secondary | ICD-10-CM | POA: Diagnosis not present

## 2017-01-04 DIAGNOSIS — J00 Acute nasopharyngitis [common cold]: Secondary | ICD-10-CM | POA: Diagnosis not present

## 2017-01-04 DIAGNOSIS — R0981 Nasal congestion: Secondary | ICD-10-CM | POA: Diagnosis not present

## 2017-01-08 DIAGNOSIS — M9903 Segmental and somatic dysfunction of lumbar region: Secondary | ICD-10-CM | POA: Diagnosis not present

## 2017-01-08 DIAGNOSIS — M9902 Segmental and somatic dysfunction of thoracic region: Secondary | ICD-10-CM | POA: Diagnosis not present

## 2017-01-08 DIAGNOSIS — S43491A Other sprain of right shoulder joint, initial encounter: Secondary | ICD-10-CM | POA: Diagnosis not present

## 2017-01-08 DIAGNOSIS — M9901 Segmental and somatic dysfunction of cervical region: Secondary | ICD-10-CM | POA: Diagnosis not present

## 2017-01-16 DIAGNOSIS — S43492A Other sprain of left shoulder joint, initial encounter: Secondary | ICD-10-CM | POA: Diagnosis not present

## 2017-01-16 DIAGNOSIS — M9903 Segmental and somatic dysfunction of lumbar region: Secondary | ICD-10-CM | POA: Diagnosis not present

## 2017-01-16 DIAGNOSIS — M9902 Segmental and somatic dysfunction of thoracic region: Secondary | ICD-10-CM | POA: Diagnosis not present

## 2017-01-16 DIAGNOSIS — M9901 Segmental and somatic dysfunction of cervical region: Secondary | ICD-10-CM | POA: Diagnosis not present

## 2017-01-18 ENCOUNTER — Encounter: Payer: Self-pay | Admitting: Internal Medicine

## 2017-01-18 ENCOUNTER — Ambulatory Visit (INDEPENDENT_AMBULATORY_CARE_PROVIDER_SITE_OTHER): Payer: Medicare Other | Admitting: Internal Medicine

## 2017-01-18 ENCOUNTER — Other Ambulatory Visit: Payer: Self-pay

## 2017-01-18 VITALS — BP 148/76 | HR 75 | Temp 97.3°F | Ht 63.0 in | Wt 143.0 lb

## 2017-01-18 DIAGNOSIS — R1013 Epigastric pain: Secondary | ICD-10-CM

## 2017-01-18 DIAGNOSIS — K219 Gastro-esophageal reflux disease without esophagitis: Secondary | ICD-10-CM | POA: Diagnosis not present

## 2017-01-18 DIAGNOSIS — K5900 Constipation, unspecified: Secondary | ICD-10-CM

## 2017-01-18 DIAGNOSIS — K5730 Diverticulosis of large intestine without perforation or abscess without bleeding: Secondary | ICD-10-CM

## 2017-01-18 NOTE — Progress Notes (Signed)
Primary Care Physician:  PROVIDER NOT [facility] Primary Gastroenterologist:  Dr. Gala Romney Pre-Procedure History & Physical: HPI:  Dorothy Lee is a 76 y.o. female here for here for evaluation of recent episodes of epigastric pain sometimes postprandially. Sometimes severe and doubles the patient over; may subside a couple of hours. Left lower quadrant abdominal pain previously has resolved. She takes MiraLAX daily;  no left lower quadrant bowel symptoms. Constipation well controlled on MiraLAX and fiber supplement. History of diverticulitis. Prior CT negative for diverticulitis  -  last year. Pulmonary nodule now being followed by Dr. Melvyn Novas.  Patient with long-standing GERD  - continues take pantoprazole 40 mg daily. History of CAD. She is not having any chest pains. No symptoms on exertion. Gallbladder is in situ. A look back in the record appears to indicate no prior gallbladder ultrasound going back almost 20 years.  No early satiety. dysphagia or odynophagia. Doing better since she had her esophagus stretched empirically last year. Multiple colonic adenomas completely resected at colonoscopy about 1 year ago.  For the past month, she has been asymptomatic from a GI standpoint.  Past Medical History:  Diagnosis Date  . Allergic rhinitis   . ALLERGIC RHINITIS   . CAD (coronary artery disease)    DES RCA and LAD 02/2008, cath 11/2012 patent RCA and LAD stents with new mild LAD stenosis s/p DES  . Cough    chronic  . Diverticulosis   . GERD (gastroesophageal reflux disease)   . Glaucoma   . History of breast cancer    right - s/p XRT 1984, surg, no chemo  . Hyperlipidemia   . HYPERTENSION, BORDERLINE   . ROTATOR CUFF SYNDROME, LEFT   . Scoliosis    with mild chronic LBP  . Urinary incontinence     Past Surgical History:  Procedure Laterality Date  . APPENDECTOMY  1981  . BREAST BIOPSY  1984  . BREAST LUMPECTOMY  1982   right breast  . COLONOSCOPY  2008   Dr. Oletta Lamas:  diverticulosis, hemorrhoids, conscious sedation  . COLONOSCOPY N/A 01/30/2016   Dr. Gala Romney: mild divertiuculosis in sigmoid colon. narrowing of the colon in association with the deverticular opening. 6 polyps removed, tubular adenoma. next TCS in 3 years  . CORONARY ANGIOPLASTY WITH STENT PLACEMENT  02/2008  . ESOPHAGOGASTRODUODENOSCOPY  2014   Dr. Fuller Plan: erosive gastritis, no h.pylori  . ESOPHAGOGASTRODUODENOSCOPY N/A 01/30/2016   Dr. Gala Romney: normal esophagus s/p dilation, small hh.  Marland Kitchen LEFT HEART CATHETERIZATION WITH CORONARY ANGIOGRAM N/A 12/02/2012   Procedure: LEFT HEART CATHETERIZATION WITH CORONARY ANGIOGRAM;  Surgeon: Jettie Booze, MD;  Location: [facility] CATH LAB;  Service: Cardiovascular;  Laterality: N/A;  . Venia Minks DILATION N/A 01/30/2016   Procedure: Venia Minks DILATION;  Surgeon: Daneil Dolin, MD;  Location: [facility] ENDO SUITE;  Service: Endoscopy;  Laterality: N/A;  . NASAL SINUS SURGERY    . PERCUTANEOUS CORONARY STENT INTERVENTION (PCI-S)  12/02/2012   Procedure: PERCUTANEOUS CORONARY STENT INTERVENTION (PCI-S);  Surgeon: Jettie Booze, MD;  Location: [facility] CATH LAB;  Service: Cardiovascular;;  DES to the MID LAD    Prior to Admission medications   Medication Sig Start Date End Date Taking? Authorizing Provider  aspirin EC 81 MG tablet Take 81 mg by mouth every morning.   Yes Historical Provider, MD  latanoprost (XALATAN) 0.005 % ophthalmic solution Place 2 drops into both eyes at bedtime. 07/28/14  Yes Historical Provider, MD  Multiple Vitamins-Minerals (PRESERVISION AREDS PO) Take 1 capsule  by mouth daily.   Yes Historical Provider, MD  nitroGLYCERIN (NITROSTAT) 0.4 MG SL tablet Place 0.4 mg under the tongue every 5 (five) minutes x 3 doses as needed for chest pain. May repeat x3   Yes Historical Provider, MD  rosuvastatin (CRESTOR) 10 MG tablet Take 1 tablet (10 mg total) by mouth daily. OVERDUE FOR FOLLOW UP. CALL AND SCHEDULE [phone] 10/05/16  Yes Sueanne Margarita, MD    benzonatate (TESSALON) 100 MG capsule Take 1 capsule (100 mg total) by mouth 3 (three) times daily as needed for cough. Patient not taking: Reported on 01/18/2017 10/30/16   Francine Graven, DO  oseltamivir (TAMIFLU) 75 MG capsule Take 1 capsule (75 mg total) by mouth every 12 (twelve) hours. Patient not taking: Reported on 01/18/2017 10/30/16   Francine Graven, DO  pantoprazole (PROTONIX) 40 MG tablet Take 40 mg by mouth daily.    Historical Provider, MD    Allergies as of 01/18/2017 - Review Complete 01/18/2017  Allergen Reaction Noted  . Crestor [rosuvastatin]  08/11/2013  . Lipitor [atorvastatin]  08/11/2013  . Lisinopril Cough 02/16/2013  . Simvastatin  08/11/2013    Family History  Problem Relation Age of Onset  . Emphysema Father   . Emphysema Brother   . Heart disease Brother     x3 brothers  . Lung cancer Brother   . Throat cancer Brother     head/neck cancer  . Colon cancer Neg Hx     Social History   Social History  . Marital status: Married    Spouse name: N/A  . Number of children: 1  . Years of education: N/A   Occupational History  . hair stylist   . [employer]METOLOGY [employer]   Social History Main Topics  . Smoking status: Never Smoker  . Smokeless tobacco: Never Used     Comment: Widowed, lives alone but has sig other.   . Alcohol use No     Comment: social alcohol > wine 2x week  . Drug use: No  . Sexual activity: Not on file   Other Topics Concern  . 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 No  significant cervical adenopathy. Lungs:  Clear throughout to auscultation.   No wheezes, crackles, or rhonchi. No acute distress. Heart:  Regular rate and rhythm; no murmurs, clicks, rubs,  or gallops. Abdomen: Non-distended, normal bowel sounds.  Soft and nontender without appreciable mass or hepatosplenomegaly.  Pulses:  Normal pulses noted. Extremities:  Without clubbing or edema.  Impression:  Very pleasant 76 year old lady with history of CAD and GERD. GERD well controlled on PPI. No recurrent anginal-like symptoms.  Recently intermittent boring epigastric pain, at times, doubled her over;  there is a postprandial component. No symptoms whatsoever in about a month or so at this time. Symptoms don't sound like peptic ulcer disease or ischemia.  I would wonder about occult gallbladder disease. No recent  gallbladder ultrasound. Gallbladder looked okay on CT previously but this modality lacks sensitivity for assessing for gallbladder stones, etc.   Recommendations:  Take miralax everyday you don't have a BM  Benefiber 1 teaspoon twice daily  Continue pantoprazole 40 mg daily  Gallbladder ultrasound - (post-prandial epigastric pain)  Further recommendations to follow  Follow Dr. Morrison Old recommendations regarding lung nodule  Notice: This dictation was prepared with Dragon dictation along with smaller phrase technology. Any transcriptional errors that result from this process are unintentional and may not be corrected upon review.

## 2017-01-18 NOTE — Patient Instructions (Signed)
Take miralax everyday you don't have a BM  Benefiber 1 teaspoon twice daily  Continue pantoprazole 40 mg daily  Gallbladder ultrasound - (post-prandial epigastric pain)  Further recommendations to follow  Follow Dr. Morrison Old recommendations regarding lung nodule

## 2017-01-22 ENCOUNTER — Ambulatory Visit (HOSPITAL_COMMUNITY): Payer: Medicare Other

## 2017-01-22 DIAGNOSIS — Z9889 Other specified postprocedural states: Secondary | ICD-10-CM | POA: Diagnosis not present

## 2017-01-22 DIAGNOSIS — J322 Chronic ethmoidal sinusitis: Secondary | ICD-10-CM | POA: Diagnosis not present

## 2017-01-23 DIAGNOSIS — S43492A Other sprain of left shoulder joint, initial encounter: Secondary | ICD-10-CM | POA: Diagnosis not present

## 2017-01-23 DIAGNOSIS — M9902 Segmental and somatic dysfunction of thoracic region: Secondary | ICD-10-CM | POA: Diagnosis not present

## 2017-01-23 DIAGNOSIS — M9901 Segmental and somatic dysfunction of cervical region: Secondary | ICD-10-CM | POA: Diagnosis not present

## 2017-01-23 DIAGNOSIS — M9903 Segmental and somatic dysfunction of lumbar region: Secondary | ICD-10-CM | POA: Diagnosis not present

## 2017-01-25 ENCOUNTER — Ambulatory Visit (HOSPITAL_COMMUNITY): Admission: RE | Admit: 2017-01-25 | Payer: Medicare Other | Source: Ambulatory Visit

## 2017-01-29 DIAGNOSIS — J322 Chronic ethmoidal sinusitis: Secondary | ICD-10-CM | POA: Diagnosis not present

## 2017-02-01 ENCOUNTER — Ambulatory Visit (HOSPITAL_COMMUNITY)
Admission: RE | Admit: 2017-02-01 | Discharge: 2017-02-01 | Disposition: A | Discharge status: Home / Self Care | Payer: Medicare Other | Source: Ambulatory Visit | Attending: Internal Medicine | Admitting: Internal Medicine

## 2017-02-01 DIAGNOSIS — K760 Fatty (change of) liver, not elsewhere classified: Secondary | ICD-10-CM | POA: Diagnosis not present

## 2017-02-01 DIAGNOSIS — R1013 Epigastric pain: Secondary | ICD-10-CM | POA: Diagnosis not present

## 2017-02-04 ENCOUNTER — Other Ambulatory Visit: Payer: Self-pay

## 2017-02-04 ENCOUNTER — Other Ambulatory Visit: Payer: Self-pay | Admitting: Internal Medicine

## 2017-02-04 DIAGNOSIS — R911 Solitary pulmonary nodule: Secondary | ICD-10-CM

## 2017-02-04 DIAGNOSIS — K760 Fatty (change of) liver, not elsewhere classified: Secondary | ICD-10-CM

## 2017-02-05 DIAGNOSIS — J320 Chronic maxillary sinusitis: Secondary | ICD-10-CM | POA: Diagnosis not present

## 2017-02-05 DIAGNOSIS — J322 Chronic ethmoidal sinusitis: Secondary | ICD-10-CM | POA: Diagnosis not present

## 2017-02-05 NOTE — Progress Notes (Signed)
ON RECALL FOR FOLLOW UP OV °

## 2017-02-13 DIAGNOSIS — M9903 Segmental and somatic dysfunction of lumbar region: Secondary | ICD-10-CM | POA: Diagnosis not present

## 2017-02-13 DIAGNOSIS — S43492A Other sprain of left shoulder joint, initial encounter: Secondary | ICD-10-CM | POA: Diagnosis not present

## 2017-02-13 DIAGNOSIS — M9902 Segmental and somatic dysfunction of thoracic region: Secondary | ICD-10-CM | POA: Diagnosis not present

## 2017-02-13 DIAGNOSIS — M9901 Segmental and somatic dysfunction of cervical region: Secondary | ICD-10-CM | POA: Diagnosis not present

## 2017-02-15 DIAGNOSIS — J320 Chronic maxillary sinusitis: Secondary | ICD-10-CM | POA: Diagnosis not present

## 2017-02-15 DIAGNOSIS — J322 Chronic ethmoidal sinusitis: Secondary | ICD-10-CM | POA: Diagnosis not present

## 2017-02-22 DIAGNOSIS — M9902 Segmental and somatic dysfunction of thoracic region: Secondary | ICD-10-CM | POA: Diagnosis not present

## 2017-02-22 DIAGNOSIS — M9903 Segmental and somatic dysfunction of lumbar region: Secondary | ICD-10-CM | POA: Diagnosis not present

## 2017-02-22 DIAGNOSIS — S43492A Other sprain of left shoulder joint, initial encounter: Secondary | ICD-10-CM | POA: Diagnosis not present

## 2017-02-22 DIAGNOSIS — M9901 Segmental and somatic dysfunction of cervical region: Secondary | ICD-10-CM | POA: Diagnosis not present

## 2017-03-08 ENCOUNTER — Ambulatory Visit (HOSPITAL_COMMUNITY): Payer: Medicare Other

## 2017-03-14 ENCOUNTER — Encounter: Payer: Self-pay | Admitting: Internal Medicine

## 2017-03-15 DIAGNOSIS — Z0000 Encounter for general adult medical examination without abnormal findings: Secondary | ICD-10-CM | POA: Diagnosis not present

## 2017-03-15 DIAGNOSIS — Z01419 Encounter for gynecological examination (general) (routine) without abnormal findings: Secondary | ICD-10-CM | POA: Diagnosis not present

## 2017-03-15 DIAGNOSIS — Z1231 Encounter for screening mammogram for malignant neoplasm of breast: Secondary | ICD-10-CM | POA: Diagnosis not present

## 2017-03-25 ENCOUNTER — Telehealth: Payer: Self-pay | Admitting: Internal Medicine

## 2017-03-25 NOTE — Telephone Encounter (Signed)
Pt is aware.  

## 2017-03-25 NOTE — Telephone Encounter (Signed)
Spoke with the pt, she said she got a letter that she needed an ov.  she said she had been having problems with constipation for 2-3 days. She took two doses of miralax at one time and used an enema and is now having good bm's again. I reviewed last ov note by RMR and he advised pt to take benefiber bid and miralax on any day she doesn't have a bm. Pt said she is feeling ok right now but has not been taking the benefiber, but she will go get some today and will let us know if she has any problems. Pt would like to keep her ov in July.

## 2017-03-25 NOTE — Telephone Encounter (Signed)
[phone] PLEASE CALL PATIENT, HAVING SEVERE LOWER ABD PAIN AND WANTED AN EMERGENCY APPOINTMENT HERE, EXPLAINED TO HER THAT I WOULD HAVE NURSE CALL AND PUT HER IN THE NEXT AVAILABLE APPT IN July

## 2017-03-25 NOTE — Telephone Encounter (Signed)
Communication noted.   Patient needs to take Benefiber every day whether she thinks she needs it or not. MiraLAX needs to be taken in the evening on any particular day she does not have a bowel movement.

## 2017-03-26 DIAGNOSIS — I25119 Atherosclerotic heart disease of native coronary artery with unspecified angina pectoris: Secondary | ICD-10-CM | POA: Diagnosis not present

## 2017-03-26 DIAGNOSIS — E559 Vitamin D deficiency, unspecified: Secondary | ICD-10-CM | POA: Diagnosis not present

## 2017-03-26 DIAGNOSIS — C50919 Malignant neoplasm of unspecified site of unspecified female breast: Secondary | ICD-10-CM | POA: Diagnosis not present

## 2017-03-26 DIAGNOSIS — I10 Essential (primary) hypertension: Secondary | ICD-10-CM | POA: Diagnosis not present

## 2017-03-26 DIAGNOSIS — K219 Gastro-esophageal reflux disease without esophagitis: Secondary | ICD-10-CM | POA: Diagnosis not present

## 2017-03-26 DIAGNOSIS — I2510 Atherosclerotic heart disease of native coronary artery without angina pectoris: Secondary | ICD-10-CM | POA: Diagnosis not present

## 2017-03-26 DIAGNOSIS — E7800 Pure hypercholesterolemia, unspecified: Secondary | ICD-10-CM | POA: Diagnosis not present

## 2017-04-02 DIAGNOSIS — M9902 Segmental and somatic dysfunction of thoracic region: Secondary | ICD-10-CM | POA: Diagnosis not present

## 2017-04-02 DIAGNOSIS — M9903 Segmental and somatic dysfunction of lumbar region: Secondary | ICD-10-CM | POA: Diagnosis not present

## 2017-04-02 DIAGNOSIS — S43492A Other sprain of left shoulder joint, initial encounter: Secondary | ICD-10-CM | POA: Diagnosis not present

## 2017-04-02 DIAGNOSIS — M9901 Segmental and somatic dysfunction of cervical region: Secondary | ICD-10-CM | POA: Diagnosis not present

## 2017-04-05 DIAGNOSIS — M9903 Segmental and somatic dysfunction of lumbar region: Secondary | ICD-10-CM | POA: Diagnosis not present

## 2017-04-05 DIAGNOSIS — M9901 Segmental and somatic dysfunction of cervical region: Secondary | ICD-10-CM | POA: Diagnosis not present

## 2017-04-05 DIAGNOSIS — M9902 Segmental and somatic dysfunction of thoracic region: Secondary | ICD-10-CM | POA: Diagnosis not present

## 2017-04-05 DIAGNOSIS — S43492A Other sprain of left shoulder joint, initial encounter: Secondary | ICD-10-CM | POA: Diagnosis not present

## 2017-04-08 DIAGNOSIS — I2510 Atherosclerotic heart disease of native coronary artery without angina pectoris: Secondary | ICD-10-CM | POA: Diagnosis not present

## 2017-04-08 DIAGNOSIS — E7800 Pure hypercholesterolemia, unspecified: Secondary | ICD-10-CM | POA: Diagnosis not present

## 2017-04-08 DIAGNOSIS — I10 Essential (primary) hypertension: Secondary | ICD-10-CM | POA: Diagnosis not present

## 2017-04-15 DIAGNOSIS — M9903 Segmental and somatic dysfunction of lumbar region: Secondary | ICD-10-CM | POA: Diagnosis not present

## 2017-04-15 DIAGNOSIS — M9901 Segmental and somatic dysfunction of cervical region: Secondary | ICD-10-CM | POA: Diagnosis not present

## 2017-04-15 DIAGNOSIS — M9902 Segmental and somatic dysfunction of thoracic region: Secondary | ICD-10-CM | POA: Diagnosis not present

## 2017-04-15 DIAGNOSIS — S43492A Other sprain of left shoulder joint, initial encounter: Secondary | ICD-10-CM | POA: Diagnosis not present

## 2017-04-16 DIAGNOSIS — M722 Plantar fascial fibromatosis: Secondary | ICD-10-CM | POA: Diagnosis not present

## 2017-04-24 ENCOUNTER — Encounter: Payer: Self-pay | Admitting: Gastroenterology

## 2017-04-24 ENCOUNTER — Ambulatory Visit (INDEPENDENT_AMBULATORY_CARE_PROVIDER_SITE_OTHER): Payer: Medicare Other | Admitting: Gastroenterology

## 2017-04-24 DIAGNOSIS — K760 Fatty (change of) liver, not elsewhere classified: Secondary | ICD-10-CM | POA: Diagnosis not present

## 2017-04-24 DIAGNOSIS — K5900 Constipation, unspecified: Secondary | ICD-10-CM | POA: Diagnosis not present

## 2017-04-24 NOTE — Assessment & Plan Note (Signed)
She will fax over copy of latest labs for review.   Instructions for fatty liver: Recommend 1-2# weight loss per week until ideal body weight through exercise & diet. Low fat/cholesterol diet.   Avoid sweets, sodas, fruit juices, sweetened beverages like tea, etc. Gradually increase exercise from 15 min daily up to 1 hr per day 5 days/week. Limit alcohol use.

## 2017-04-24 NOTE — Progress Notes (Signed)
Primary Care Physician: Bretta Bang, MD  Primary Gastroenterologist:  Garfield Cornea, MD   Chief Complaint  Patient presents with  . Abdominal Pain    llq, resolves after having bm    HPI: Dorothy Lee is a 76 y.o. female here for follow-up. Seen in April by Dr. Gala Romney. History of constipation, diverticulitis, epigastric pain, GERD. At last ov, she reported severe epigastric pain brought on by meal, concerning for biliary disease. Symptoms had not occurred for one month. Right upper quadrant ultrasound on April 27 showed fatty liver but no gallbladder disease. Plans for HIDA if recurrent symptoms.   She has been feeling well. No further epigastric pain. She has vague LLQ pain preceding BM, resolves after BM. Stool most days. No melena, brbpr. She takes fiber gummies sometimes. Takes miralax as needed. Cannot take daily or she goes too much. No heartburn on protonix. No dysphagia. No n/v. Had an episode of constipation for which she called in for back in 03/2017.   She had labs with new PCP. They were "normal". Patient to fax [place] a copy.   Next TCS due in 01/2019.    Current Outpatient Prescriptions  Medication Sig Dispense Refill  . aspirin EC 81 MG tablet Take 81 mg by mouth every morning.    . latanoprost (XALATAN) 0.005 % ophthalmic solution Place 2 drops into both eyes at bedtime.    . meloxicam (MOBIC) 15 MG tablet Take 15 mg by mouth daily.    . nitroGLYCERIN (NITROSTAT) 0.4 MG SL tablet Place 0.4 mg under the tongue every 5 (five) minutes x 3 doses as needed for chest pain. May repeat x3    . pantoprazole (PROTONIX) 40 MG tablet Take 40 mg by mouth daily.    . rosuvastatin (CRESTOR) 10 MG tablet Take 1 tablet (10 mg total) by mouth daily. OVERDUE FOR FOLLOW UP. CALL AND SCHEDULE [phone] 30 tablet 0   No current facility-administered medications for this visit.     Allergies as of 04/24/2017 - Review Complete 04/24/2017  Allergen Reaction Noted  . Crestor  [rosuvastatin]  08/11/2013  . Lipitor [atorvastatin]  08/11/2013  . Lisinopril Cough 02/16/2013  . Simvastatin  08/11/2013    ROS:  General: Negative for anorexia, weight loss, fever, chills, fatigue, weakness. ENT: Negative for hoarseness, difficulty swallowing , nasal congestion. CV: Negative for chest pain, angina, palpitations, dyspnea on exertion, peripheral edema.  Respiratory: Negative for dyspnea at rest, dyspnea on exertion, cough, sputum, wheezing.  GI: See history of present illness. GU:  Negative for dysuria, hematuria, urinary incontinence, urinary frequency, nocturnal urination.  Endo: Negative for unusual weight change.    Physical Examination:   BP (!) 163/87   Pulse 80   Temp (!) 96.9 F (36.1 C) (Oral)   Ht 5\' 3"  (1.6 m)   Wt 142 lb 9.6 oz (64.7 kg)   BMI 25.26 kg/m   General: Well-nourished, well-developed in no acute distress.  Eyes: No icterus. Mouth: Oropharyngeal mucosa moist and pink , no lesions erythema or exudate. Lungs: Clear to auscultation bilaterally.  Heart: Regular rate and rhythm, no murmurs rubs or gallops.  Abdomen: Bowel sounds are normal, nontender, nondistended, no hepatosplenomegaly or masses, no abdominal bruits or hernia , no rebound or guarding.   Extremities: No lower extremity edema. No clubbing or deformities. Neuro: Alert and oriented x 4   Skin: Warm and dry, no jaundice.   Psych: Alert and cooperative, normal mood and affect.

## 2017-04-24 NOTE — Assessment & Plan Note (Signed)
Recommend fiber supplement such as Benefiber 1 teaspoon bid. Takes miralax at bedtime on days she does not have good BM. Return to the office in six months or sooner if needed.

## 2017-04-24 NOTE — Patient Instructions (Addendum)
1. Please fax copy of your labs to [phone].  2. Take Benefiber 1 teaspoon twice daily.  3. Take Miralax one capful at bedtime on days you have not had a good bowel movement.  4. Return to the office in six months or sooner if needed.

## 2017-04-25 NOTE — Progress Notes (Signed)
cc'ed to pcp °

## 2017-04-26 DIAGNOSIS — I10 Essential (primary) hypertension: Secondary | ICD-10-CM | POA: Diagnosis not present

## 2017-04-26 DIAGNOSIS — E7800 Pure hypercholesterolemia, unspecified: Secondary | ICD-10-CM | POA: Diagnosis not present

## 2017-04-26 DIAGNOSIS — R911 Solitary pulmonary nodule: Secondary | ICD-10-CM | POA: Diagnosis not present

## 2017-04-26 DIAGNOSIS — E559 Vitamin D deficiency, unspecified: Secondary | ICD-10-CM | POA: Diagnosis not present

## 2017-04-26 DIAGNOSIS — I25119 Atherosclerotic heart disease of native coronary artery with unspecified angina pectoris: Secondary | ICD-10-CM | POA: Diagnosis not present

## 2017-04-26 DIAGNOSIS — C50919 Malignant neoplasm of unspecified site of unspecified female breast: Secondary | ICD-10-CM | POA: Diagnosis not present

## 2017-04-26 DIAGNOSIS — K219 Gastro-esophageal reflux disease without esophagitis: Secondary | ICD-10-CM | POA: Diagnosis not present

## 2017-05-01 DIAGNOSIS — M9901 Segmental and somatic dysfunction of cervical region: Secondary | ICD-10-CM | POA: Diagnosis not present

## 2017-05-01 DIAGNOSIS — M9903 Segmental and somatic dysfunction of lumbar region: Secondary | ICD-10-CM | POA: Diagnosis not present

## 2017-05-01 DIAGNOSIS — S43492A Other sprain of left shoulder joint, initial encounter: Secondary | ICD-10-CM | POA: Diagnosis not present

## 2017-05-01 DIAGNOSIS — M9902 Segmental and somatic dysfunction of thoracic region: Secondary | ICD-10-CM | POA: Diagnosis not present

## 2017-05-06 DIAGNOSIS — R911 Solitary pulmonary nodule: Secondary | ICD-10-CM | POA: Diagnosis not present

## 2017-05-07 DIAGNOSIS — S43492A Other sprain of left shoulder joint, initial encounter: Secondary | ICD-10-CM | POA: Diagnosis not present

## 2017-05-07 DIAGNOSIS — M9902 Segmental and somatic dysfunction of thoracic region: Secondary | ICD-10-CM | POA: Diagnosis not present

## 2017-05-07 DIAGNOSIS — M9903 Segmental and somatic dysfunction of lumbar region: Secondary | ICD-10-CM | POA: Diagnosis not present

## 2017-05-07 DIAGNOSIS — M9901 Segmental and somatic dysfunction of cervical region: Secondary | ICD-10-CM | POA: Diagnosis not present

## 2017-05-22 ENCOUNTER — Ambulatory Visit: Payer: Medicare Other | Admitting: Internal Medicine

## 2017-05-27 DIAGNOSIS — M9901 Segmental and somatic dysfunction of cervical region: Secondary | ICD-10-CM | POA: Diagnosis not present

## 2017-05-27 DIAGNOSIS — M9902 Segmental and somatic dysfunction of thoracic region: Secondary | ICD-10-CM | POA: Diagnosis not present

## 2017-05-27 DIAGNOSIS — S43492A Other sprain of left shoulder joint, initial encounter: Secondary | ICD-10-CM | POA: Diagnosis not present

## 2017-05-27 DIAGNOSIS — M9903 Segmental and somatic dysfunction of lumbar region: Secondary | ICD-10-CM | POA: Diagnosis not present

## 2017-06-07 DIAGNOSIS — M9902 Segmental and somatic dysfunction of thoracic region: Secondary | ICD-10-CM | POA: Diagnosis not present

## 2017-06-07 DIAGNOSIS — M9903 Segmental and somatic dysfunction of lumbar region: Secondary | ICD-10-CM | POA: Diagnosis not present

## 2017-06-07 DIAGNOSIS — M9901 Segmental and somatic dysfunction of cervical region: Secondary | ICD-10-CM | POA: Diagnosis not present

## 2017-06-07 DIAGNOSIS — S43492A Other sprain of left shoulder joint, initial encounter: Secondary | ICD-10-CM | POA: Diagnosis not present

## 2017-07-05 DIAGNOSIS — H401114 Primary open-angle glaucoma, right eye, indeterminate stage: Secondary | ICD-10-CM | POA: Diagnosis not present

## 2017-07-05 DIAGNOSIS — H401122 Primary open-angle glaucoma, left eye, moderate stage: Secondary | ICD-10-CM | POA: Diagnosis not present

## 2017-07-05 DIAGNOSIS — Z961 Presence of intraocular lens: Secondary | ICD-10-CM | POA: Diagnosis not present

## 2017-07-05 DIAGNOSIS — H04123 Dry eye syndrome of bilateral lacrimal glands: Secondary | ICD-10-CM | POA: Diagnosis not present

## 2017-07-16 DIAGNOSIS — Z23 Encounter for immunization: Secondary | ICD-10-CM | POA: Diagnosis not present

## 2017-07-23 DIAGNOSIS — M7071 Other bursitis of hip, right hip: Secondary | ICD-10-CM | POA: Diagnosis not present

## 2017-07-23 DIAGNOSIS — M461 Sacroiliitis, not elsewhere classified: Secondary | ICD-10-CM | POA: Diagnosis not present

## 2017-07-23 DIAGNOSIS — M19012 Primary osteoarthritis, left shoulder: Secondary | ICD-10-CM | POA: Diagnosis not present

## 2017-07-29 DIAGNOSIS — M461 Sacroiliitis, not elsewhere classified: Secondary | ICD-10-CM | POA: Diagnosis not present

## 2017-07-29 DIAGNOSIS — M7071 Other bursitis of hip, right hip: Secondary | ICD-10-CM | POA: Diagnosis not present

## 2017-07-29 DIAGNOSIS — M19012 Primary osteoarthritis, left shoulder: Secondary | ICD-10-CM | POA: Diagnosis not present

## 2017-07-30 DIAGNOSIS — M25551 Pain in right hip: Secondary | ICD-10-CM | POA: Diagnosis not present

## 2017-07-30 DIAGNOSIS — R05 Cough: Secondary | ICD-10-CM | POA: Diagnosis not present

## 2017-08-06 DIAGNOSIS — J0121 Acute recurrent ethmoidal sinusitis: Secondary | ICD-10-CM | POA: Diagnosis not present

## 2017-08-06 DIAGNOSIS — H539 Unspecified visual disturbance: Secondary | ICD-10-CM | POA: Diagnosis not present

## 2017-08-20 DIAGNOSIS — M7061 Trochanteric bursitis, right hip: Secondary | ICD-10-CM | POA: Diagnosis not present

## 2017-08-20 DIAGNOSIS — M25551 Pain in right hip: Secondary | ICD-10-CM | POA: Diagnosis not present

## 2017-09-03 DIAGNOSIS — Z22322 Carrier or suspected carrier of Methicillin resistant Staphylococcus aureus: Secondary | ICD-10-CM | POA: Diagnosis not present

## 2017-09-03 DIAGNOSIS — A4902 Methicillin resistant Staphylococcus aureus infection, unspecified site: Secondary | ICD-10-CM | POA: Diagnosis not present

## 2017-09-18 ENCOUNTER — Encounter: Payer: Self-pay | Admitting: Internal Medicine

## 2017-10-21 ENCOUNTER — Ambulatory Visit: Payer: Medicare Other | Admitting: Cardiology

## 2017-10-22 ENCOUNTER — Ambulatory Visit (INDEPENDENT_AMBULATORY_CARE_PROVIDER_SITE_OTHER): Payer: Medicare Other | Admitting: Gastroenterology

## 2017-10-22 VITALS — BP 139/87 | HR 91 | Temp 97.5°F | Ht 63.0 in | Wt 143.2 lb

## 2017-10-22 DIAGNOSIS — R102 Pelvic and perineal pain: Secondary | ICD-10-CM | POA: Insufficient documentation

## 2017-10-22 DIAGNOSIS — R399 Unspecified symptoms and signs involving the genitourinary system: Secondary | ICD-10-CM | POA: Insufficient documentation

## 2017-10-22 NOTE — Patient Instructions (Signed)
Please have urinalysis and labs done today. Depending on these findings, we may need to do further imaging.  I would increase the benefiber to twice a day as tolerated.  Further recommendations to follow!

## 2017-10-22 NOTE — Progress Notes (Signed)
Referring Provider: Bretta Bang, MD Primary Care Physician:  Bretta Bang, MD Primary GI: Dr. Gala Romney   Chief Complaint  Patient presents with  . Abdominal Pain    upper and lower abd    HPI:   Dorothy Lee is a 77 y.o. female presenting today with a history of constipation, diverticulitis, epigastric pain, GERD. Last seen July 2018. Previous US abdomen with fatty liver but no gallstones. LFTs completely normal June 2018.    Constant urinary frequency. Gets up multiple times during the night. Chronic. Has an odor to urine. Sometimes cloudy. Notes suprapubic discomfort, starting 4 days ago. Has been taking benefiber just once daily. Has tried Miralax once, yesterday. Small output and runny. BMs haven't been productive past few days. Never tried Linzess or Amitiza. No rectal bleeding. No fever. Sometimes will have chills at night under the covers for the last week or two but not all the time. Just every once in awhile.   Every once in awhile will have upper abdominal pain but will go away. Unsure if related to eating. No N/V. Just every once in awhile. Took a Goody powders recently for back pain.   Past Medical History:  Diagnosis Date  . Allergic rhinitis   . ALLERGIC RHINITIS   . CAD (coronary artery disease)    DES RCA and LAD 02/2008, cath 11/2012 patent RCA and LAD stents with new mild LAD stenosis s/p DES  . Cough    chronic  . Diverticulosis   . GERD (gastroesophageal reflux disease)   . Glaucoma   . History of breast cancer    right - s/p XRT 1984, surg, no chemo  . Hyperlipidemia   . HYPERTENSION, BORDERLINE   . ROTATOR CUFF SYNDROME, LEFT   . Scoliosis    with mild chronic LBP  . Urinary incontinence     Past Surgical History:  Procedure Laterality Date  . APPENDECTOMY  1981  . BREAST BIOPSY  1984  . BREAST LUMPECTOMY  1982   right breast  . COLONOSCOPY  2008   Dr. Oletta Lamas: diverticulosis, hemorrhoids, conscious sedation  . COLONOSCOPY N/A 01/30/2016     Dr. Gala Romney: mild divertiuculosis in sigmoid colon. narrowing of the colon in association with the deverticular opening. 6 polyps removed, tubular adenoma. next TCS in 3 years  . CORONARY ANGIOPLASTY WITH STENT PLACEMENT  02/2008  . ESOPHAGOGASTRODUODENOSCOPY  2014   Dr. Fuller Plan: erosive gastritis, no h.pylori  . ESOPHAGOGASTRODUODENOSCOPY N/A 01/30/2016   Dr. Gala Romney: normal esophagus s/p dilation, small hh.  Marland Kitchen LEFT HEART CATHETERIZATION WITH CORONARY ANGIOGRAM N/A 12/02/2012   Procedure: LEFT HEART CATHETERIZATION WITH CORONARY ANGIOGRAM;  Surgeon: Jettie Booze, MD;  Location: [facility] CATH LAB;  Service: Cardiovascular;  Laterality: N/A;  . Venia Minks DILATION N/A 01/30/2016   Procedure: Venia Minks DILATION;  Surgeon: Daneil Dolin, MD;  Location: [facility] ENDO SUITE;  Service: Endoscopy;  Laterality: N/A;  . NASAL SINUS SURGERY    . PERCUTANEOUS CORONARY STENT INTERVENTION (PCI-S)  12/02/2012   Procedure: PERCUTANEOUS CORONARY STENT INTERVENTION (PCI-S);  Surgeon: Jettie Booze, MD;  Location: [facility] CATH LAB;  Service: Cardiovascular;;  DES to the MID LAD    Current Outpatient Medications  Medication Sig Dispense Refill  . aspirin EC 81 MG tablet Take 81 mg by mouth every morning.    . latanoprost (XALATAN) 0.005 % ophthalmic solution Place 2 drops into both eyes at bedtime.    . nitroGLYCERIN (NITROSTAT) 0.4 MG SL tablet Place 0.4 mg under the  tongue every 5 (five) minutes x 3 doses as needed for chest pain. May repeat x3    . pantoprazole (PROTONIX) 40 MG tablet Take 40 mg by mouth daily.    . rosuvastatin (CRESTOR) 10 MG tablet Take 1 tablet (10 mg total) by mouth daily. OVERDUE FOR FOLLOW UP. CALL AND SCHEDULE [phone] 30 tablet 0   No current facility-administered medications for this visit.     Allergies as of 10/22/2017 - Review Complete 10/22/2017  Allergen Reaction Noted  . Crestor [rosuvastatin]  08/11/2013  . Lipitor [atorvastatin]  08/11/2013  . Lisinopril Cough 02/16/2013  .  Simvastatin  08/11/2013    Family History  Problem Relation Age of Onset  . Emphysema Father   . Emphysema Brother   . Heart disease Brother        x3 brothers  . Lung cancer Brother   . Throat cancer Brother        head/neck cancer  . Colon cancer Neg Hx     Social History   Socioeconomic History  . Marital status: Married    Spouse name: Not on file  . Number of children: 1  . Years of education: Not on file  . Highest education level: Not on file  Social Needs  . Financial resource strain: Not on file  . Food insecurity - worry: Not on file  . Food insecurity - inability: Not on file  . Transportation needs - medical: Not on file  . Transportation needs - non-medical: Not on file  Occupational History  . Occupation: Probation officer  . Occupation: COSMETOLOGY    Employer: [employer]  Tobacco Use  . Smoking status: Never Smoker  . Smokeless tobacco: Never Used  . Tobacco comment: Widowed, lives alone but has sig other.   Substance and Sexual Activity  . Alcohol use: No    Comment: social alcohol, glass of wine once a month  . Drug use: No  . Sexual activity: Not on file  Other Topics Concern  . 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 Denies fatigue, weakness, weight loss.  CV: Denies chest pain, palpitations, syncope, peripheral edema, and claudication. Resp: Denies dyspnea at rest, cough, wheezing, coughing up blood, and pleurisy. GI: see HPI  Derm: Denies rash, itching, dry skin Psych: Denies depression, anxiety, memory loss, confusion. No homicidal or suicidal ideation.  Heme: Denies bruising, bleeding, and enlarged lymph nodes.  Physical Exam: BP 139/87   Pulse 91   Temp (!) 97.5 F (36.4 C) (Oral)   Ht 5\' 3"  (1.6 m)   Wt 143 lb  3.2 oz (65 kg)   BMI 25.37 kg/m  General:   Alert and oriented. No distress noted. Pleasant and cooperative.  Head:  Normocephalic and atraumatic. Eyes:  Conjuctiva clear without scleral icterus. Mouth:  Oral mucosa pink and moist. Good dentition. No lesions. Abdomen:  +BS, soft, mild suprapubic TTP, mild LLQ TTP and non-distended. No rebound or guarding. No HSM or masses noted. Msk:  Symmetrical without gross deformities. Normal posture. Extremities:  Without edema. Neurologic:  Alert and  oriented x4 Psych:  Alert and cooperative. Normal mood and affect.

## 2017-10-22 NOTE — Assessment & Plan Note (Signed)
Four day history of suprapubic abdominal pain, afebrile, non-toxic appearing. Chronic constipation possibly playing a role, unable to rule out UTI, less likely diverticulitis. CBC, CMP, UA with culture ordered now. Discussed may need imaging if UA is negative, no improvement noted with constipation management, or worsening pain.   As of note she has a chronic history of epigastric pain, with concern for biliary etiology in past. This pain is rare, and she is declining HIDA at this point. If any persistent epigastric pain noted, would pursue HIDA; however, this does not seem to be a significant issue at time of visit.   Colonoscopy due 2020 barring clinical changes.

## 2017-10-22 NOTE — Assessment & Plan Note (Signed)
UA with culture ordered

## 2017-10-23 ENCOUNTER — Other Ambulatory Visit: Payer: Self-pay | Admitting: Gastroenterology

## 2017-10-23 NOTE — Progress Notes (Signed)
Looks like patient has a UTI. Liver numbers, kidney, all normal. No increased white blood cell count. Labs are good. Culture is pending. I can get her started on an antibiotic, but the culture will tell us which antibiotic would be best for her so there is a chance it may need to be switched (maybe not). What pharmacy would she like me to send to?

## 2017-10-24 MED ORDER — NITROFURANTOIN MONOHYD MACRO 100 MG PO CAPS: 100.0000 mg | ORAL_CAPSULE | Freq: Two times a day (BID) | ORAL | 0 refills | Status: AC

## 2017-10-24 NOTE — Progress Notes (Signed)
I contacted patient and sent in Macrobid 100 mg BID X 5 days. Still awaiting culture.

## 2017-10-24 NOTE — Progress Notes (Signed)
CC'D TO PCP °

## 2017-10-25 ENCOUNTER — Encounter: Payer: Self-pay | Admitting: Cardiology

## 2017-10-25 LAB — URINE CULTURE
MICRO NUMBER: 90065871
SPECIMEN QUALITY: ADEQUATE

## 2017-10-25 LAB — COMPLETE METABOLIC PANEL WITH GFR
AG Ratio: 1.7 (calc) (ref 1.0–2.5)
ALBUMIN MSPROF: 4 g/dL (ref 3.6–5.1)
ALT: 16 U/L (ref 6–29)
AST: 18 U/L (ref 10–35)
Alkaline phosphatase (APISO): 77 U/L (ref 33–130)
BILIRUBIN TOTAL: 0.5 mg/dL (ref 0.2–1.2)
BUN: 13 mg/dL (ref 7–25)
CO2: 30 mmol/L (ref 20–32)
CREATININE: 0.84 mg/dL (ref 0.60–0.93)
Calcium: 9 mg/dL (ref 8.6–10.4)
Chloride: 105 mmol/L (ref 98–110)
GFR, EST AFRICAN AMERICAN: 78 mL/min/{1.73_m2} (ref >=60)
GFR, Est Non African American: 67 mL/min/{1.73_m2} (ref >=60)
GLUCOSE: 63 mg/dL — AB (ref 65–139)
Globulin: 2.3 g/dL (calc) (ref 1.9–3.7)
Potassium: 4 mmol/L (ref 3.5–5.3)
Sodium: 143 mmol/L (ref 135–146)
TOTAL PROTEIN: 6.3 g/dL (ref 6.1–8.1)

## 2017-10-25 LAB — CBC WITH DIFFERENTIAL/PLATELET
BASOS PCT: 0.7 %
Basophils Absolute: 59 cells/uL (ref 0–200)
EOS ABS: 286 {cells}/uL (ref 15–500)
Eosinophils Relative: 3.4 %
HEMATOCRIT: 40.2 % (ref 35.0–45.0)
HEMOGLOBIN: 13.6 g/dL (ref 11.7–15.5)
LYMPHS ABS: 1940 {cells}/uL (ref 850–3900)
MCH: 29.8 pg (ref 27.0–33.0)
MCHC: 33.8 g/dL (ref 32.0–36.0)
MCV: 88.2 fL (ref 80.0–100.0)
MONOS PCT: 8.4 %
MPV: 9.8 fL (ref 7.5–12.5)
NEUTROS ABS: 5410 {cells}/uL (ref 1500–7800)
Neutrophils Relative %: 64.4 %
Platelets: 349 10*3/uL (ref 140–400)
RBC: 4.56 10*6/uL (ref 3.80–5.10)
RDW: 13 % (ref 11.0–15.0)
Total Lymphocyte: 23.1 %
WBC: 8.4 10*3/uL (ref 3.8–10.8)
WBCMIX: 706 {cells}/uL (ref 200–950)

## 2017-10-25 LAB — URINALYSIS W MICROSCOPIC + REFLEX CULTURE
BILIRUBIN URINE: NEGATIVE
Glucose, UA: NEGATIVE
HGB URINE DIPSTICK: NEGATIVE
Hyaline Cast: NONE SEEN /LPF
KETONES UR: NEGATIVE
NITRITES URINE, INITIAL: POSITIVE — AB
PROTEIN: NEGATIVE
RBC / HPF: NONE SEEN /HPF (ref 0–2)
Specific Gravity, Urine: 1.02 (ref 1.001–1.03)
Squamous Epithelial / LPF: NONE SEEN /HPF (ref <=5)
pH: 5.5 (ref 5.0–8.0)

## 2017-10-25 LAB — CULTURE INDICATED

## 2017-10-25 NOTE — Progress Notes (Signed)
Cardiology Office Note  Date: 10/28/2017   ID: NYKERRIA Lee, DOB 05-Aug-1941, MRN [id number]  PCP: Bretta Bang, MD  Consulting Cardiologist: [name], MD   Chief Complaint  Patient presents with  . Coronary Artery Disease    History of Present Illness: Dorothy Lee is a 77 y.o. female referred for cardiology consultation by Dr. Malvin Johns to establish follow-up of CAD. She is a former patient of Dr. Radford Pax, last seen in November 2015. I reviewed her records and updated the chart. She did have subsequent follow-up with Dr. Cyndie Mull in [place]. She presents today stating that over the last several months she has had intermittent episodes of chest discomfort sometimes radiating to the jaw, states that it feels like indigestion but she has trouble distinguishing this from angina. She has taken a few nitroglycerin at times with improvement. She works as a Theme park manager, is on her feet a lot of the day. Feels like stress may also be contributing.  I reviewed her cardiac catheterization and PCI from 2014 as outlined below. She had a follow-up Myoview study in [place] back in January 2017 that was negative for ischemia with LVEF greater than 70%. She states that she had some testing done within the last 6 months, cannot recall the results, was not happy with her follow-up per Dr. Cyndie Mull and does not plan to present for any additional studies through his practice.  We went over her medications. She reports compliance.  Past Medical History:  Diagnosis Date  . Allergic rhinitis   . CAD (coronary artery disease)    DES RCA and LAD 02/2008, DES mid LAD 11/2012  . Cough   . Diverticulosis   . Essential hypertension   . GERD (gastroesophageal reflux disease)   . Glaucoma   . History of breast cancer    Right - s/p XRT 1984, surgery, no chemo  . Hyperlipidemia   . Rotator cuff syndrome, left   . Scoliosis   . Urinary incontinence     Past Surgical History:  Procedure Laterality  Date  . APPENDECTOMY  1981  . BREAST BIOPSY  1984  . BREAST LUMPECTOMY  1982   right breast  . COLONOSCOPY  2008   Dr. Oletta Lamas: diverticulosis, hemorrhoids, conscious sedation  . COLONOSCOPY N/A 01/30/2016   Dr. Gala Romney: mild divertiuculosis in sigmoid colon. narrowing of the colon in association with the deverticular opening. 6 polyps removed, tubular adenoma. next TCS in 3 years  . CORONARY ANGIOPLASTY WITH STENT PLACEMENT  02/2008  . ESOPHAGOGASTRODUODENOSCOPY  2014   Dr. Fuller Plan: erosive gastritis, no h.pylori  . ESOPHAGOGASTRODUODENOSCOPY N/A 01/30/2016   Dr. Gala Romney: normal esophagus s/p dilation, small hh.  Marland Kitchen LEFT HEART CATHETERIZATION WITH CORONARY ANGIOGRAM N/A 12/02/2012   Procedure: LEFT HEART CATHETERIZATION WITH CORONARY ANGIOGRAM;  Surgeon: Jettie Booze, MD;  Location: [facility] CATH LAB;  Service: Cardiovascular;  Laterality: N/A;  . Venia Minks DILATION N/A 01/30/2016   Procedure: Venia Minks DILATION;  Surgeon: Daneil Dolin, MD;  Location: [facility] ENDO SU[facility];  Service: Endoscopy;  Laterality: N/A;  . NASAL SINUS SURGERY    . PERCUTANEOUS CORONARY STENT INTERVENTION (PCI-S)  12/02/2012   Procedure: PERCUTANEOUS CORONARY STENT INTERVENTION (PCI-S);  Surgeon: Jettie Booze, MD;  Location: [facility] CATH LAB;  Service: Cardiovascular;;  DES to the MID LAD    Current Outpatient Medications  Medication Sig Dispense Refill  . aspirin EC 81 MG tablet Take 81 mg by mouth every morning.    . latanoprost (XALATAN) 0.005 % ophthalmic  solution Place 2 drops into both eyes at bedtime.    . nitrofurantoin, macrocrystal-monohydrate, (MACROBID) 100 MG capsule Take 1 capsule (100 mg total) by mouth 2 (two) times daily for 5 days. 10 capsule 0  . nitroGLYCERIN (NITROSTAT) 0.4 MG SL tablet Place 0.4 mg under the tongue every 5 (five) minutes x 3 doses as needed for chest pain. May repeat x3    . pantoprazole (PROTONIX) 40 MG tablet Take 40 mg by mouth daily.    . rosuvastatin (CRESTOR) 10 MG tablet Take 1 tablet  (10 mg total) by mouth daily. OVERDUE FOR FOLLOW UP. CALL AND SCHEDULE [phone] 30 tablet 0   No current facility-administered medications for this visit.    Allergies:  Crestor [rosuvastatin]; Lipitor [atorvastatin]; Lisinopril; and Simvastatin   Social History: The patient  reports that  has never smoked. she has never used smokeless tobacco. She reports that she does not drink alcohol or use drugs.   Family History: The patient's family history includes Emphysema in her brother and father; Heart disease in her brother; Lung cancer in her brother; Throat cancer in her brother.   ROS:  Please see the history of present illness. Otherwise, complete review of systems is positive for arthritic symptoms.  All other systems are reviewed and negative.   Physical Exam: VS:  BP (!) 142/80   Pulse 88   Ht 5\' 3"  (1.6 m)   Wt 145 lb 6.4 oz (66 kg)   SpO2 97%   BMI 25.76 kg/m , BMI Body mass index is 25.76 kg/m.  Wt Readings from Last 3 Encounters:  10/28/17 145 lb 6.4 oz (66 kg)  10/22/17 143 lb 3.2 oz (65 kg)  04/24/17 142 lb 9.6 oz (64.7 kg)    General: Patient appears comfortable at rest. HEENT: Conjunctiva and lids normal, oropharynx clear with moist mucosa. Neck: Supple, no elevated JVP or carotid bruits, no thyromegaly. Lungs: Clear to auscultation, nonlabored breathing at rest. Cardiac: Regular rate and rhythm, no S3 or significant systolic murmur, no pericardial rub. Abdomen: Soft, nontender, no hepatomegaly, bowel sounds present, no guarding or rebound. Extremities: No pitting edema, distal pulses 2+. Skin: Warm and dry. Musculoskeletal: No kyphosis. Neuropsychiatric: Alert and oriented x3, affect grossly appropriate.  ECG: I personally reviewed the tracing from 08/19/2014 which showed sinus rhythm with left atrial enlargement.  Recent Labwork: 10/22/2017: ALT 16; AST 18; BUN 13; Creat 0.84; Hemoglobin 13.6; Platelets 349; Potassium 4.0; Sodium 143     Component Value  Date/Time   CHOL 156 08/19/2014 1453   TRIG 82.0 08/19/2014 1453   TRIG 119 05/16/2010   HDL 42.30 08/19/2014 1453   CHOLHDL 4 08/19/2014 1453   VLDL 16.4 08/19/2014 1453   LDLCALC 97 08/19/2014 1453   LDLDIRECT 144.4 11/09/2013 0934  June 2018: Cholesterol 163, triglycerides 104, HDL 42, LDL 100  Other Studies Reviewed Today:  Echocardiogram 08/11/2013: Study Conclusions  - Left ventricle: The cavity size was normal. Systolic function was normal. The estimated ejection fraction was in the range of 55% to 60%. Wall motion was normal; there were no regional wall motion abnormalities. There was an increased relative contribution of atrial contraction to ventricular filling. Doppler parameters are consistent with abnormal left ventricular relaxation (grade 1 diastolic dysfunction). - Mitral valve: Mild regurgitation. - Pulmonary arteries: PA peak pressure: 5mm Hg (S).  Cardiac catheterization 12/02/2012: HEMODYNAMICS:  Aortic pressure was 138/78mmHg; LV pressure was 149/31mmHg; LVEDP 91mmHg.  There was no gradient between the left ventricle and aorta.  ANGIOGRAPHIC DATA:   The left main coronary artery is widely patent.  It bifurcates into an LAD and left circumflex artery.  The left anterior descending artery is widely patent.  There is a stent in the proximal portion which is patent.  It gives rise to a small diagonal 1 and then a second small diagonal branch.  After the takeoff of the second diagonal there is an 80% stenosis of the mid LAD.  The ongoing LAD is patent.  The left circumflex artery is widely patent.  It gives rise to a large OM1 and moderate sized OM2 which are widely patent.  The ongoing left circ traverses the AV groove and is patent.  The right coronary artery is widely patent.   There is a long stent in the proximal and mid RCA which are patent.  The RCA gives rise to 2 moderate sized acute RV marginal branches which are patent.    LEFT  VENTRICULOGRAM:  Left ventricular angiogram was done in the 30 RAO projection and revealed normal left ventricular wall motion and systolic function with an estimated ejection fraction of 65%.  LVEDP was 26 mmHg.  IMPRESSIONS:  1. Normal left main coronary artery. 2. Patent proximal LAD stent with 80% mid LAF stenosis. 3. Normal left circumflex artery and its branches. 4. Patent RCA stents 5. Normal left ventricular systolic function.  LVEDP 26 mmHg.  Ejection fraction 65%.  Coronary PCI 12/02/2012: PCI NARRATIVE:  An XB LAD 3.0 was used to engage the left main.  A prowater wire was placed across the area of disease in the mid LAD.  A 2.0 x 20 Emerge balloon was used to predilate.  There was significant vasospasm noted with wiring the vessel.  This resolved with intracoronary nitroglycerin.  A 2.25 x 32 Promus drug-eluting stent was then deployed overlapping the previously placed stent.  The stent was postdilated with a 2.5 balloon in the mid and a 3.0 balloon proximally.    Several more doses of intracoronary nitroglycerin were administered.  There is an excellent angiographic result.  TIMI-3 flow was maintained throughout.  A mynx device was used for hemostasis.  There is a question of a small hematoma below the access site.  A FemoStop was placed.  IMPRESSIONS:  1. Successful drug-eluting stent placement to the mid left anterior descending artery, overlapping the distal edge of the previously placed Promus stent, with a 2.25 x 32 Promus stent.  This was postdilated to 2.5 mm distally and to 3.0 mm proximally.  Assessment and Plan:  1. CAD with history of DES to the RCA and LAD in 2009 with subsequent DES to the LAD in 2014. She is reporting intermittent symptoms consistent with angina, although difficult for her to distinguish from reflux. Plan is to continue medical therapy and proceed with an exercise Myoview for ischemic follow-up testing.  2. Essential hypertension by history. Keep  follow-up with PCP.  3. Hyperlipidemia, continues on Crestor. LDL 100 in June 2018.  4. GERD, on Protonix.  Current medicines were reviewed with the patient today.   Orders Placed This Encounter  Procedures  . NM Myocar Multi W/Spect W/Wall Motion / EF    Disposition: Call test results and determine next step.  Signed, Satira Sark, MD, [facility] 10/28/2017 3:31 PM    [facility] [address], [place], [place] [address] Phone: [phone]; Fax: [phone]

## 2017-10-28 ENCOUNTER — Ambulatory Visit (INDEPENDENT_AMBULATORY_CARE_PROVIDER_SITE_OTHER): Payer: Medicare Other | Admitting: Cardiology

## 2017-10-28 ENCOUNTER — Telehealth: Payer: Self-pay | Admitting: Cardiology

## 2017-10-28 ENCOUNTER — Encounter: Payer: Self-pay | Admitting: Cardiology

## 2017-10-28 VITALS — BP 142/80 | HR 88 | Ht 63.0 in | Wt 145.4 lb

## 2017-10-28 DIAGNOSIS — I10 Essential (primary) hypertension: Secondary | ICD-10-CM | POA: Diagnosis not present

## 2017-10-28 DIAGNOSIS — E782 Mixed hyperlipidemia: Secondary | ICD-10-CM | POA: Diagnosis not present

## 2017-10-28 DIAGNOSIS — I25119 Atherosclerotic heart disease of native coronary artery with unspecified angina pectoris: Secondary | ICD-10-CM

## 2017-10-28 DIAGNOSIS — K219 Gastro-esophageal reflux disease without esophagitis: Secondary | ICD-10-CM | POA: Diagnosis not present

## 2017-10-28 DIAGNOSIS — I209 Angina pectoris, unspecified: Secondary | ICD-10-CM

## 2017-10-28 NOTE — Patient Instructions (Signed)
Medication Instructions:  Your physician recommends that you continue on your current medications as directed. Please refer to the Current Medication list given to you today.  Labwork: NONE  Testing/Procedures: Your physician has requested that you have en exercise stress myoview. For further information please visit [email or web address]. Please follow instruction sheet, as given.  Follow-Up: Your physician recommends that you schedule a follow-up appointment PENDING TEST RESULTS  Any Other Special Instructions Will Be Listed Below (If Applicable).  If you need a refill on your cardiac medications before your next appointment, please call your pharmacy.

## 2017-10-28 NOTE — Telephone Encounter (Signed)
Exercise myoview- CAD with angina Scheduled at [facility] Nov 05, 2017 arrive at 10am

## 2017-10-30 NOTE — Progress Notes (Signed)
Can we see how patient is doing? Final culture came back with E.coli, but she was already taking antibiotics to cover this. I hope she is better. If so, return in 3 months.

## 2017-10-31 ENCOUNTER — Encounter: Payer: Self-pay | Admitting: Internal Medicine

## 2017-11-04 DIAGNOSIS — E559 Vitamin D deficiency, unspecified: Secondary | ICD-10-CM | POA: Diagnosis not present

## 2017-11-04 DIAGNOSIS — K219 Gastro-esophageal reflux disease without esophagitis: Secondary | ICD-10-CM | POA: Diagnosis not present

## 2017-11-04 DIAGNOSIS — C50919 Malignant neoplasm of unspecified site of unspecified female breast: Secondary | ICD-10-CM | POA: Diagnosis not present

## 2017-11-04 DIAGNOSIS — R911 Solitary pulmonary nodule: Secondary | ICD-10-CM | POA: Diagnosis not present

## 2017-11-04 DIAGNOSIS — I25119 Atherosclerotic heart disease of native coronary artery with unspecified angina pectoris: Secondary | ICD-10-CM | POA: Diagnosis not present

## 2017-11-04 DIAGNOSIS — I10 Essential (primary) hypertension: Secondary | ICD-10-CM | POA: Diagnosis not present

## 2017-11-04 DIAGNOSIS — E7800 Pure hypercholesterolemia, unspecified: Secondary | ICD-10-CM | POA: Diagnosis not present

## 2017-11-04 NOTE — Progress Notes (Signed)
Patient scheduled.

## 2017-11-05 ENCOUNTER — Encounter (HOSPITAL_COMMUNITY)
Admission: RE | Admit: 2017-11-05 | Discharge: 2017-11-05 | Disposition: A | Discharge status: Home / Self Care | Payer: Medicare Other | Source: Ambulatory Visit | Attending: Cardiology | Admitting: Cardiology

## 2017-11-05 ENCOUNTER — Encounter (HOSPITAL_COMMUNITY): Payer: Self-pay

## 2017-11-05 ENCOUNTER — Ambulatory Visit (HOSPITAL_COMMUNITY)
Admission: RE | Admit: 2017-11-05 | Discharge: 2017-11-05 | Disposition: A | Discharge status: Home / Self Care | Payer: Medicare Other | Source: Ambulatory Visit | Attending: Cardiology | Admitting: Cardiology

## 2017-11-05 DIAGNOSIS — I25119 Atherosclerotic heart disease of native coronary artery with unspecified angina pectoris: Secondary | ICD-10-CM | POA: Diagnosis not present

## 2017-11-05 HISTORY — DX: Malignant (primary) neoplasm, unspecified: C80.1

## 2017-11-05 LAB — NM MYOCAR MULTI W/SPECT W/WALL MOTION / EF
CHL CUP NUCLEAR SDS: 0
CHL CUP NUCLEAR SRS: 4
CHL CUP NUCLEAR SSS: 4
CHL CUP RESTING HR STRESS: 77 {beats}/min
LHR: 0.3
LV dias vol: 38 mL (ref 46–106)
LV sys vol: 10 mL
NUC STRESS TID: 1.37
Peak HR: 101 {beats}/min

## 2017-11-05 MED ORDER — TECHNETIUM TC 99M TETROFOSMIN IV KIT
30.0000 | PACK | Freq: Once | INTRAVENOUS | Status: AC | PRN
  Administered 2017-11-05: 33 via INTRAVENOUS

## 2017-11-05 MED ORDER — REGADENOSON 0.4 MG/5ML IV SOLN
INTRAVENOUS | Status: AC
  Administered 2017-11-05: 0.4 mg via INTRAVENOUS
  Filled 2017-11-05: qty 5

## 2017-11-05 MED ORDER — SODIUM CHLORIDE 0.9% FLUSH
INTRAVENOUS | Status: AC
  Administered 2017-11-05: 10 mL via INTRAVENOUS
  Filled 2017-11-05: qty 10

## 2017-11-05 MED ORDER — TECHNETIUM TC 99M TETROFOSMIN IV KIT
10.0000 | PACK | Freq: Once | INTRAVENOUS | Status: AC | PRN
  Administered 2017-11-05: 11 via INTRAVENOUS

## 2017-11-06 ENCOUNTER — Telehealth: Payer: Self-pay

## 2017-11-06 MED ORDER — ISOSORBIDE MONONITRATE ER 30 MG PO TB24: 30.0000 mg | ORAL_TABLET | Freq: Every day | ORAL | 0 refills | Status: DC

## 2017-11-06 NOTE — Telephone Encounter (Incomplete)
Patients husband notified. Routed to PCP. Appointment scheduled. Rx sent to pharmacy

## 2017-11-06 NOTE — Telephone Encounter (Signed)
-----   Message from Acquanetta Chain, LPN sent at 3/87/5643  7:26 AM EST -----   ----- Message ----- From: Satira Sark, MD Sent: 11/05/2017   4:39 PM To: Merlene Laughter, LPN  Results reviewed. Please let her know that the stress test was overall low risk which was reassuring. Since she has had some potential angina symptoms, let's try and put her on Imdur at 30 mg in the evening to see how she does. Schedule a follow-up visit in the next 3-4 months, sooner if needed. A copy of this test should be forwarded to Bretta Bang, MD.

## 2017-11-14 ENCOUNTER — Encounter (HOSPITAL_COMMUNITY): Payer: Self-pay

## 2017-11-14 ENCOUNTER — Other Ambulatory Visit: Payer: Self-pay

## 2017-11-14 ENCOUNTER — Emergency Department (HOSPITAL_COMMUNITY)
Admission: EM | Admit: 2017-11-14 | Discharge: 2017-11-14 | Disposition: A | Discharge status: Home / Self Care | Payer: Medicare Other | Source: Home / Self Care | Attending: Emergency Medicine | Admitting: Emergency Medicine

## 2017-11-14 ENCOUNTER — Emergency Department (HOSPITAL_COMMUNITY): Payer: Medicare Other

## 2017-11-14 DIAGNOSIS — Z7982 Long term (current) use of aspirin: Secondary | ICD-10-CM

## 2017-11-14 DIAGNOSIS — J40 Bronchitis, not specified as acute or chronic: Secondary | ICD-10-CM | POA: Insufficient documentation

## 2017-11-14 DIAGNOSIS — J069 Acute upper respiratory infection, unspecified: Secondary | ICD-10-CM

## 2017-11-14 DIAGNOSIS — R05 Cough: Secondary | ICD-10-CM | POA: Diagnosis not present

## 2017-11-14 DIAGNOSIS — R109 Unspecified abdominal pain: Secondary | ICD-10-CM | POA: Diagnosis not present

## 2017-11-14 DIAGNOSIS — Z955 Presence of coronary angioplasty implant and graft: Secondary | ICD-10-CM | POA: Insufficient documentation

## 2017-11-14 DIAGNOSIS — K5720 Diverticulitis of large intestine with perforation and abscess without bleeding: Secondary | ICD-10-CM | POA: Diagnosis not present

## 2017-11-14 DIAGNOSIS — K5900 Constipation, unspecified: Secondary | ICD-10-CM | POA: Diagnosis not present

## 2017-11-14 DIAGNOSIS — I2510 Atherosclerotic heart disease of native coronary artery without angina pectoris: Secondary | ICD-10-CM

## 2017-11-14 DIAGNOSIS — B9620 Unspecified Escherichia coli [E. coli] as the cause of diseases classified elsewhere: Secondary | ICD-10-CM | POA: Diagnosis not present

## 2017-11-14 DIAGNOSIS — I119 Hypertensive heart disease without heart failure: Secondary | ICD-10-CM

## 2017-11-14 DIAGNOSIS — H409 Unspecified glaucoma: Secondary | ICD-10-CM | POA: Diagnosis not present

## 2017-11-14 DIAGNOSIS — R112 Nausea with vomiting, unspecified: Secondary | ICD-10-CM | POA: Diagnosis not present

## 2017-11-14 DIAGNOSIS — N390 Urinary tract infection, site not specified: Secondary | ICD-10-CM | POA: Diagnosis not present

## 2017-11-14 DIAGNOSIS — E876 Hypokalemia: Secondary | ICD-10-CM | POA: Diagnosis not present

## 2017-11-14 DIAGNOSIS — Z79899 Other long term (current) drug therapy: Secondary | ICD-10-CM

## 2017-11-14 DIAGNOSIS — R1030 Lower abdominal pain, unspecified: Secondary | ICD-10-CM | POA: Diagnosis not present

## 2017-11-14 MED ORDER — DEXAMETHASONE 4 MG PO TABS: 4.0000 mg | ORAL_TABLET | Freq: Two times a day (BID) | ORAL | 0 refills | Status: DC

## 2017-11-14 MED ORDER — DEXAMETHASONE SODIUM PHOSPHATE 10 MG/ML IJ SOLN
10.0000 mg | Freq: Once | INTRAMUSCULAR | Status: AC
Start: 2017-11-14 — End: 2017-11-14
  Administered 2017-11-14: 10 mg via INTRAMUSCULAR
  Filled 2017-11-14: qty 1

## 2017-11-14 MED ORDER — HYDROCOD POLST-CPM POLST ER 10-8 MG/5ML PO SUER
5.0000 mL | Freq: Once | ORAL | Status: AC
  Administered 2017-11-14: 5 mL via ORAL
  Filled 2017-11-14: qty 5

## 2017-11-14 MED ORDER — CHLORPHENIRAMINE MALEATE 4 MG PO TABS: 4.0000 mg | ORAL_TABLET | Freq: Three times a day (TID) | ORAL | 0 refills | Status: DC

## 2017-11-14 MED ORDER — HYDROCODONE-HOMATROPINE 5-1.5 MG/5ML PO SYRP: 5.0000 mL | ORAL_SOLUTION | Freq: Four times a day (QID) | ORAL | 0 refills | Status: DC | PRN

## 2017-11-14 NOTE — Discharge Instructions (Signed)
Please increase fluids.  Wash hands frequently.  Use your mask until symptoms have resolved.  Symptoms have resolved use Hycodan for cough.  This medication may cause drowsiness, use it with caution.  Please use Decadron 2 times daily with food.  Use chlorpheniramine 3 times daily for congestion.  Please see Dr. Melvyn Novas for additional evaluation if not improving.

## 2017-11-14 NOTE — ED Triage Notes (Signed)
Patient complains of generalized body aches, cough and vomiting x1 yesterday. Denies fevers. Has taken otc medications with no relief.

## 2017-11-14 NOTE — ED Provider Notes (Signed)
[facility] EMERGENCY DEPARTMENT Provider Note   CSN: [id number] Arrival date & time: 11/14/17  6644     History   Chief Complaint Chief Complaint  Patient presents with  . Generalized Body Aches  . Cough    HPI Dorothy Lee is a 77 y.o. female.  Patient is a 77 year old female who presents to the emergency department with a complaint of body aches, chills, and one episode of vomiting.  The patient states that approximately 3 days ago she started to feel ill.  She was having body aches and cough.  On yesterday she had one episode of vomiting.  The body aches continue.  She states that she feels flushed like she may have been having fever.  She has not had diarrhea to be reported.  No unusual rash.  No hemoptysis reported.  Patient has been trying over-the-counter medications, but states these do not seem to be helping her very much.  She was concerned that the symptoms were continuing on for more than a couple days and she came to the emergency department for evaluation.      Past Medical History:  Diagnosis Date  . Allergic rhinitis   . CAD (coronary artery disease)    DES RCA and LAD 02/2008, DES mid LAD 11/2012  . Cancer (HCC)    Rt Breast  . Cough   . Diverticulosis   . Essential hypertension   . GERD (gastroesophageal reflux disease)   . Glaucoma   . History of breast cancer    Right - s/p XRT 1984, surgery, no chemo  . Hyperlipidemia   . Rotator cuff syndrome, left   . Scoliosis   . Urinary incontinence     Patient Active Problem List   Diagnosis Date Noted  . Suprapubic abdominal pain 10/22/2017  . Lower urinary tract symptoms (LUTS) 10/22/2017  . Constipation 04/24/2017  . Fatty liver 04/24/2017  . Solitary pulmonary nodule 10/05/2016  . LLQ pain 08/10/2016  . History of colonic polyps   . Diverticulosis of colon without hemorrhage   . Dysphagia   . Esophageal dysphagia 01/05/2016  . Acute diverticulitis 01/05/2016  . Postoperative breast asymmetry  06/02/2015  . Cough variant asthma 01/19/2014  . Weight loss, unintentional 09/29/2013  . Upper airway cough syndrome 11/20/2010  . Chronic sinusitis 11/08/2010  . ALLERGIC RHINITIS 11/08/2010  . PALPITATIONS, HX OF 10/05/2010  . ROTATOR CUFF SYNDROME, LEFT 09/25/2010  . GERD 09/22/2010  . URINARY INCONTINENCE 09/22/2010  . Dyslipidemia 09/15/2010  . Essential hypertension 09/15/2010  . Coronary atherosclerosis 09/15/2010  . BREAST CANCER, HX OF 09/15/2010    Past Surgical History:  Procedure Laterality Date  . APPENDECTOMY  1981  . BREAST BIOPSY  1984  . BREAST LUMPECTOMY  1982   right breast  . COLONOSCOPY  2008   Dr. Oletta Lamas: diverticulosis, hemorrhoids, conscious sedation  . COLONOSCOPY N/A 01/30/2016   Dr. Gala Romney: mild divertiuculosis in sigmoid colon. narrowing of the colon in association with the deverticular opening. 6 polyps removed, tubular adenoma. next TCS in 3 years  . CORONARY ANGIOPLASTY WITH STENT PLACEMENT  02/2008  . ESOPHAGOGASTRODUODENOSCOPY  2014   Dr. Fuller Plan: erosive gastritis, no h.pylori  . ESOPHAGOGASTRODUODENOSCOPY N/A 01/30/2016   Dr. Gala Romney: normal esophagus s/p dilation, small hh.  Marland Kitchen LEFT HEART CATHETERIZATION WITH CORONARY ANGIOGRAM N/A 12/02/2012   Procedure: LEFT HEART CATHETERIZATION WITH CORONARY ANGIOGRAM;  Surgeon: Jettie Booze, MD;  Location: [facility] CATH LAB;  Service: Cardiovascular;  Laterality: N/A;  . MALONEY DILATION  N/A 01/30/2016   Procedure: Venia Minks DILATION;  Surgeon: Daneil Dolin, MD;  Location: [facility] ENDO SUITE;  Service: Endoscopy;  Laterality: N/A;  . NASAL SINUS SURGERY    . PERCUTANEOUS CORONARY STENT INTERVENTION (PCI-S)  12/02/2012   Procedure: PERCUTANEOUS CORONARY STENT INTERVENTION (PCI-S);  Surgeon: Jettie Booze, MD;  Location: [facility] CATH LAB;  Service: Cardiovascular;;  DES to the MID LAD    OB History    No data available       Home Medications    Prior to Admission medications   Medication Sig Start Date End  Date Taking? Authorizing Provider  aspirin EC 81 MG tablet Take 81 mg by mouth every morning.    [provider]  isosorbide mononitrate (IMDUR) 30 MG 24 hr tablet Take 1 tablet (30 mg total) by mouth daily. 11/06/17 02/04/18  Satira Sark, MD  latanoprost (XALATAN) 0.005 % ophthalmic solution Place 2 drops into both eyes at bedtime. 07/28/14   [provider]  nitroGLYCERIN (NITROSTAT) 0.4 MG SL tablet Place 0.4 mg under the tongue every 5 (five) minutes x 3 doses as needed for chest pain. May repeat x3    [provider]  pantoprazole (PROTONIX) 40 MG tablet Take 40 mg by mouth daily.    [provider]  rosuvastatin (CRESTOR) 10 MG tablet Take 1 tablet (10 mg total) by mouth daily. OVERDUE FOR FOLLOW UP. CALL AND SCHEDULE [phone] 10/05/16   Sueanne Margarita, MD    Family History Family History  Problem Relation Age of Onset  . Emphysema Father   . Emphysema Brother   . Heart disease Brother        x3 brothers  . Lung cancer Brother   . Throat cancer Brother        head/neck cancer  . Colon cancer Neg Hx     Social History Social History   Tobacco Use  . Smoking status: Never Smoker  . Smokeless tobacco: Never Used  . Tobacco comment: Widowed, lives alone but has sig other.   Substance Use Topics  . Alcohol use: No    Comment: social alcohol, glass of wine once a month  . Drug use: No     Allergies   Crestor [rosuvastatin]; Lipitor [atorvastatin]; Lisinopril; and Simvastatin   Review of Systems Review of Systems  Constitutional: Positive for appetite change. Negative for activity change and fever.       All ROS Neg except as noted in HPI  HENT: Positive for congestion. Negative for nosebleeds.   Eyes: Negative for photophobia and discharge.  Respiratory: Positive for cough. Negative for shortness of breath and wheezing.   Cardiovascular: Negative for chest pain and palpitations.  Gastrointestinal: Positive for vomiting.  Negative for abdominal pain and blood in stool.  Genitourinary: Negative for dysuria, frequency and hematuria.  Musculoskeletal: Positive for myalgias. Negative for arthralgias, back pain and neck pain.  Skin: Negative.   Neurological: Negative for dizziness, seizures and speech difficulty.  Psychiatric/Behavioral: Negative for confusion and hallucinations.     Physical Exam Updated Vital Signs BP (!) 157/82 (BP Location: Left Arm)   Pulse 100   Temp 99.5 F (37.5 C) (Oral)   Resp 18   Ht 5\' 3"  (1.6 m)   Wt 63.5 kg (140 lb)   SpO2 97%   BMI 24.80 kg/m   Physical Exam  Constitutional: She is oriented to person, place, and time. She appears well-developed and well-nourished.  Non-toxic appearance.  HENT:  Head: Normocephalic.  Right Ear: Tympanic membrane and external ear normal.  Left Ear: Tympanic membrane and external ear normal.  Nasal congestion present.  Eyes: EOM and lids are normal. Pupils are equal, round, and reactive to light.  Neck: Normal range of motion. Neck supple. Carotid bruit is not present.  Cardiovascular: Normal rate, regular rhythm, normal heart sounds, intact distal pulses and normal pulses. Exam reveals no gallop and no friction rub.  Pulmonary/Chest: No respiratory distress.  No use of accessory muscles.  There is symmetrical rise and fall of the chest.  The patient speaks in complete sentences without problem. There are bilateral rhonchi present with a few soft end expiratory wheezes noted.  Abdominal: Soft. Bowel sounds are normal. There is no tenderness. There is no guarding.  Musculoskeletal: Normal range of motion. She exhibits no edema or tenderness.  Lymphadenopathy:       Head (right side): No submandibular adenopathy present.       Head (left side): No submandibular adenopathy present.    She has no cervical adenopathy.  Neurological: She is alert and oriented to person, place, and time. She has normal strength. No cranial nerve deficit or  sensory deficit.  Skin: Skin is warm and dry.  Psychiatric: She has a normal mood and affect. Her speech is normal.  Nursing note and vitals reviewed.    ED Treatments / Results  Labs (all labs ordered are listed, but only abnormal results are displayed) Labs Reviewed - No data to display  EKG  EKG Interpretation None       Radiology Dg Chest 2 View  Result Date: 11/14/2017 CLINICAL DATA:  Cough, chills, vomiting, and weakness. EXAM: CHEST  2 VIEW COMPARISON:  Chest x-ray dated October 30, 2016. FINDINGS: The heart size and mediastinal contours are within normal limits. Multiple coronary stents again noted. Normal pulmonary vascularity. Slightly more prominent interstitial markings when compared to prior study. No focal consolidation, pleural effusion, or pneumothorax. Unchanged eventration of the right hemidiaphragm. No acute osseous abnormality. IMPRESSION: New bronchitic changes.  No consolidation Electronically Signed   By: Titus Dubin M.D.   On: 11/14/2017 11:05    Procedures Procedures (including critical care time)  Medications Ordered in ED Medications - No data to display   Initial Impression / Assessment and Plan / ED Course  I have reviewed the triage vital signs and the nursing notes.  Pertinent labs & imaging results that were available during my care of the patient were reviewed by me and considered in my medical decision making (see chart for details).      Final Clinical Impressions(s) / ED Diagnoses MDM  Vital signs reviewed.  No vomiting noted in the emergency department.  The history and examination favors bronchitis. Chest x-ray shows new bronchitis changes, but no consolidation.  Patient is not a smoker.  Patient will be placed on a short course of steroids, chlorpheniramine for congestion, and Hycodan for cough.  I have informed the patient that the chlorpheniramine and the Hycodan may cause drowsiness and to use caution getting around.  The  patient's husband states that he will help her and help monitor her when she is taking these medications.  The patient will follow up with their primary physician the beginning of next week.  They will return to the emergency department sooner if any changes in condition, problems, or concerns.   Final diagnoses:  Bronchitis  Upper respiratory tract infection, unspecified type    ED Discharge Orders  Ordered    chlorpheniramine (CHLOR-TRIMETON) 4 MG tablet  3 times daily with meals     11/14/17 1444    HYDROcodone-homatropine (HYCODAN) 5-1.5 MG/5ML syrup  Every 6 hours PRN     11/14/17 1444    dexamethasone (DECADRON) 4 MG tablet  2 times daily     11/14/17 1444       Lily Kocher, PA-C 11/15/17 0753    Julianne Rice, MD 11/15/17 1214

## 2017-11-17 ENCOUNTER — Encounter (HOSPITAL_COMMUNITY): Payer: Self-pay | Admitting: Emergency Medicine

## 2017-11-17 ENCOUNTER — Inpatient Hospital Stay (HOSPITAL_COMMUNITY)
Admission: EM | Admit: 2017-11-17 | Discharge: 2017-11-20 | DRG: 392 | Disposition: A | Discharge status: Home / Self Care | Payer: Medicare Other | Attending: Internal Medicine | Admitting: Internal Medicine

## 2017-11-17 ENCOUNTER — Emergency Department (HOSPITAL_COMMUNITY): Payer: Medicare Other

## 2017-11-17 ENCOUNTER — Other Ambulatory Visit: Payer: Self-pay

## 2017-11-17 DIAGNOSIS — K5732 Diverticulitis of large intestine without perforation or abscess without bleeding: Secondary | ICD-10-CM | POA: Diagnosis not present

## 2017-11-17 DIAGNOSIS — Z888 Allergy status to other drugs, medicaments and biological substances status: Secondary | ICD-10-CM | POA: Diagnosis not present

## 2017-11-17 DIAGNOSIS — K219 Gastro-esophageal reflux disease without esophagitis: Secondary | ICD-10-CM | POA: Diagnosis present

## 2017-11-17 DIAGNOSIS — Z853 Personal history of malignant neoplasm of breast: Secondary | ICD-10-CM

## 2017-11-17 DIAGNOSIS — K631 Perforation of intestine (nontraumatic): Secondary | ICD-10-CM | POA: Diagnosis not present

## 2017-11-17 DIAGNOSIS — Z7982 Long term (current) use of aspirin: Secondary | ICD-10-CM

## 2017-11-17 DIAGNOSIS — E876 Hypokalemia: Secondary | ICD-10-CM | POA: Diagnosis present

## 2017-11-17 DIAGNOSIS — R112 Nausea with vomiting, unspecified: Secondary | ICD-10-CM | POA: Diagnosis not present

## 2017-11-17 DIAGNOSIS — I10 Essential (primary) hypertension: Secondary | ICD-10-CM | POA: Diagnosis present

## 2017-11-17 DIAGNOSIS — E785 Hyperlipidemia, unspecified: Secondary | ICD-10-CM | POA: Diagnosis present

## 2017-11-17 DIAGNOSIS — I2510 Atherosclerotic heart disease of native coronary artery without angina pectoris: Secondary | ICD-10-CM | POA: Diagnosis present

## 2017-11-17 DIAGNOSIS — H409 Unspecified glaucoma: Secondary | ICD-10-CM | POA: Diagnosis present

## 2017-11-17 DIAGNOSIS — Z955 Presence of coronary angioplasty implant and graft: Secondary | ICD-10-CM

## 2017-11-17 DIAGNOSIS — K5900 Constipation, unspecified: Secondary | ICD-10-CM | POA: Diagnosis present

## 2017-11-17 DIAGNOSIS — K5720 Diverticulitis of large intestine with perforation and abscess without bleeding: Secondary | ICD-10-CM

## 2017-11-17 DIAGNOSIS — Z79899 Other long term (current) drug therapy: Secondary | ICD-10-CM | POA: Diagnosis not present

## 2017-11-17 DIAGNOSIS — B9620 Unspecified Escherichia coli [E. coli] as the cause of diseases classified elsewhere: Secondary | ICD-10-CM | POA: Diagnosis present

## 2017-11-17 DIAGNOSIS — N390 Urinary tract infection, site not specified: Secondary | ICD-10-CM | POA: Diagnosis present

## 2017-11-17 DIAGNOSIS — K5792 Diverticulitis of intestine, part unspecified, without perforation or abscess without bleeding: Secondary | ICD-10-CM | POA: Diagnosis present

## 2017-11-17 DIAGNOSIS — R109 Unspecified abdominal pain: Secondary | ICD-10-CM | POA: Diagnosis not present

## 2017-11-17 DIAGNOSIS — R1030 Lower abdominal pain, unspecified: Secondary | ICD-10-CM | POA: Diagnosis not present

## 2017-11-17 DIAGNOSIS — Z923 Personal history of irradiation: Secondary | ICD-10-CM | POA: Diagnosis not present

## 2017-11-17 LAB — COMPREHENSIVE METABOLIC PANEL
ALT: 25 U/L (ref 14–54)
AST: 31 U/L (ref 15–41)
Albumin: 3.6 g/dL (ref 3.5–5.0)
Alkaline Phosphatase: 66 U/L (ref 38–126)
Anion gap: 10 (ref 5–15)
BUN: 24 mg/dL — AB (ref 6–20)
CO2: 27 mmol/L (ref 22–32)
CREATININE: 1.12 mg/dL — AB (ref 0.44–1.00)
Calcium: 9.1 mg/dL (ref 8.9–10.3)
Chloride: 102 mmol/L (ref 101–111)
GFR calc Af Amer: 53 mL/min — ABNORMAL LOW (ref >60)
GFR calc non Af Amer: 46 mL/min — ABNORMAL LOW (ref >60)
GLUCOSE: 155 mg/dL — AB (ref 65–99)
Potassium: 3.3 mmol/L — ABNORMAL LOW (ref 3.5–5.1)
SODIUM: 139 mmol/L (ref 135–145)
Total Bilirubin: 0.6 mg/dL (ref 0.3–1.2)
Total Protein: 7 g/dL (ref 6.5–8.1)

## 2017-11-17 LAB — URINALYSIS, ROUTINE W REFLEX MICROSCOPIC
BILIRUBIN URINE: NEGATIVE
Glucose, UA: NEGATIVE mg/dL
Hgb urine dipstick: NEGATIVE
Ketones, ur: NEGATIVE mg/dL
Nitrite: POSITIVE — AB
Protein, ur: NEGATIVE mg/dL
SPECIFIC GRAVITY, URINE: 1.014 (ref 1.005–1.030)
pH: 5 (ref 5.0–8.0)

## 2017-11-17 LAB — CBC
HCT: 42.8 % (ref 36.0–46.0)
Hemoglobin: 13.4 g/dL (ref 12.0–15.0)
MCH: 29.1 pg (ref 26.0–34.0)
MCHC: 31.3 g/dL (ref 30.0–36.0)
MCV: 92.8 fL (ref 78.0–100.0)
PLATELETS: 309 10*3/uL (ref 150–400)
RBC: 4.61 MIL/uL (ref 3.87–5.11)
RDW: 13.4 % (ref 11.5–15.5)
WBC: 11.1 10*3/uL — ABNORMAL HIGH (ref 4.0–10.5)

## 2017-11-17 LAB — LIPASE, BLOOD: LIPASE: 20 U/L (ref 11–51)

## 2017-11-17 LAB — CBG MONITORING, ED: Glucose-Capillary: 158 mg/dL — ABNORMAL HIGH (ref 65–99)

## 2017-11-17 MED ORDER — ONDANSETRON HCL 4 MG PO TABS: 4.0000 mg | ORAL_TABLET | Freq: Four times a day (QID) | ORAL | Status: DC | PRN

## 2017-11-17 MED ORDER — ONDANSETRON HCL 4 MG/2ML IJ SOLN
4.0000 mg | Freq: Once | INTRAMUSCULAR | Status: AC
  Administered 2017-11-17: 4 mg via INTRAVENOUS
  Filled 2017-11-17: qty 2

## 2017-11-17 MED ORDER — PIPERACILLIN-TAZOBACTAM 3.375 G IVPB
3.3750 g | Freq: Three times a day (TID) | INTRAVENOUS | Status: DC
  Administered 2017-11-18 – 2017-11-20 (×7): 3.375 g via INTRAVENOUS
  Filled 2017-11-17 (×6): qty 50

## 2017-11-17 MED ORDER — ACETAMINOPHEN 650 MG RE SUPP: 650.0000 mg | Freq: Four times a day (QID) | RECTAL | Status: DC | PRN

## 2017-11-17 MED ORDER — POTASSIUM CHLORIDE 10 MEQ/100ML IV SOLN
10.0000 meq | Freq: Once | INTRAVENOUS | Status: AC
  Administered 2017-11-17: 10 meq via INTRAVENOUS
  Filled 2017-11-17: qty 100

## 2017-11-17 MED ORDER — SODIUM CHLORIDE 0.9 % IV BOLUS (SEPSIS)
500.0000 mL | Freq: Once | INTRAVENOUS | Status: AC
Start: 2017-11-17 — End: 2017-11-17
  Administered 2017-11-17: 500 mL via INTRAVENOUS

## 2017-11-17 MED ORDER — CIPROFLOXACIN IN D5W 400 MG/200ML IV SOLN
400.0000 mg | Freq: Once | INTRAVENOUS | Status: DC
  Administered 2017-11-17: 400 mg via INTRAVENOUS
  Filled 2017-11-17: qty 200

## 2017-11-17 MED ORDER — ACETAMINOPHEN 325 MG PO TABS
650.0000 mg | ORAL_TABLET | Freq: Four times a day (QID) | ORAL | Status: DC | PRN
Start: 2017-11-17 — End: 2017-11-20

## 2017-11-17 MED ORDER — ONDANSETRON HCL 4 MG/2ML IJ SOLN: 4.0000 mg | Freq: Four times a day (QID) | INTRAMUSCULAR | Status: DC | PRN

## 2017-11-17 MED ORDER — METRONIDAZOLE IN NACL 5-0.79 MG/ML-% IV SOLN
500.0000 mg | Freq: Once | INTRAVENOUS | Status: DC
  Administered 2017-11-17: 500 mg via INTRAVENOUS
  Filled 2017-11-17: qty 100

## 2017-11-17 MED ORDER — ENOXAPARIN SODIUM 40 MG/0.4ML ~~LOC~~ SOLN
40.0000 mg | SUBCUTANEOUS | Status: DC
  Administered 2017-11-18 – 2017-11-20 (×3): 40 mg via SUBCUTANEOUS
  Filled 2017-11-17 (×3): qty 0.4

## 2017-11-17 MED ORDER — IOPAMIDOL (ISOVUE-300) INJECTION 61%
80.0000 mL | Freq: Once | INTRAVENOUS | Status: AC | PRN
  Administered 2017-11-17: 20:00:00 via INTRAVENOUS

## 2017-11-17 MED ORDER — SODIUM CHLORIDE 0.9 % IV SOLN
INTRAVENOUS | Status: DC
  Administered 2017-11-17 – 2017-11-20 (×4): via INTRAVENOUS

## 2017-11-17 MED ORDER — LATANOPROST 0.005 % OP SOLN
2.0000 [drp] | Freq: Every day | OPHTHALMIC | Status: DC
  Administered 2017-11-18 (×2): 2 [drp] via OPHTHALMIC
  Filled 2017-11-17 (×2): qty 2.5

## 2017-11-17 MED ORDER — HYDROMORPHONE HCL 1 MG/ML IJ SOLN
1.0000 mg | INTRAMUSCULAR | Status: DC | PRN
  Administered 2017-11-18 (×2): 1 mg via INTRAVENOUS
  Filled 2017-11-17 (×2): qty 1

## 2017-11-17 MED ORDER — HYDROMORPHONE HCL 1 MG/ML IJ SOLN
0.5000 mg | Freq: Once | INTRAMUSCULAR | Status: AC
  Administered 2017-11-17: 0.5 mg via INTRAVENOUS
  Filled 2017-11-17: qty 1

## 2017-11-17 MED ORDER — DIPHENHYDRAMINE HCL 50 MG/ML IJ SOLN
25.0000 mg | Freq: Once | INTRAMUSCULAR | Status: AC
  Administered 2017-11-17: 25 mg via INTRAVENOUS
  Filled 2017-11-17: qty 1

## 2017-11-17 MED ORDER — GUAIFENESIN-DM 100-10 MG/5ML PO SYRP
5.0000 mL | ORAL_SOLUTION | ORAL | Status: DC | PRN
  Administered 2017-11-18: 5 mL via ORAL
  Filled 2017-11-17: qty 5

## 2017-11-17 MED ORDER — PIPERACILLIN-TAZOBACTAM 3.375 G IVPB 30 MIN
3.3750 g | Freq: Once | INTRAVENOUS | Status: AC
  Administered 2017-11-17: 3.375 g via INTRAVENOUS
  Filled 2017-11-17: qty 50

## 2017-11-17 NOTE — ED Notes (Signed)
Have paused antibiotics. Pt.'s arm has started to get red and itchy. Will notify MD

## 2017-11-17 NOTE — ED Provider Notes (Addendum)
[facility] EMERGENCY DEPARTMENT Provider Note   CSN: [id number] Arrival date & time: 11/17/17  1101     History   Chief Complaint Chief Complaint  Patient presents with  . Abdominal Pain    HPI Dorothy Lee is a 77 y.o. female.  Patient with complaint of feeling like she is constipated and pain to the left side of her abdomen yesterday.  Not associated with nausea and vomiting.  Patient recently discharged from the emergency department with the diagnosis of flu not confirmed.  Treated for persistent cough with hydrocodone cough medicine.  Patient tried a fleets enema and MiraLAX at home for the constipation without any relief.  Patient denies any fevers.  Patient states that the upper respiratory infection is improved significantly.  Past medical history significant for coronary artery disease with stents.      Past Medical History:  Diagnosis Date  . Allergic rhinitis   . CAD (coronary artery disease)    DES RCA and LAD 02/2008, DES mid LAD 11/2012  . Cancer (HCC)    Rt Breast  . Cough   . Diverticulosis   . Essential hypertension   . GERD (gastroesophageal reflux disease)   . Glaucoma   . History of breast cancer    Right - s/p XRT 1984, surgery, no chemo  . Hyperlipidemia   . Rotator cuff syndrome, left   . Scoliosis   . Urinary incontinence     Patient Active Problem List   Diagnosis Date Noted  . Suprapubic abdominal pain 10/22/2017  . Lower urinary tract symptoms (LUTS) 10/22/2017  . Constipation 04/24/2017  . Fatty liver 04/24/2017  . Solitary pulmonary nodule 10/05/2016  . LLQ pain 08/10/2016  . History of colonic polyps   . Diverticulosis of colon without hemorrhage   . Dysphagia   . Esophageal dysphagia 01/05/2016  . Acute diverticulitis 01/05/2016  . Postoperative breast asymmetry 06/02/2015  . Cough variant asthma 01/19/2014  . Weight loss, unintentional 09/29/2013  . Upper airway cough syndrome 11/20/2010  . Chronic sinusitis 11/08/2010  .  ALLERGIC RHINITIS 11/08/2010  . PALPITATIONS, HX OF 10/05/2010  . ROTATOR CUFF SYNDROME, LEFT 09/25/2010  . GERD 09/22/2010  . URINARY INCONTINENCE 09/22/2010  . Dyslipidemia 09/15/2010  . Essential hypertension 09/15/2010  . Coronary atherosclerosis 09/15/2010  . BREAST CANCER, HX OF 09/15/2010    Past Surgical History:  Procedure Laterality Date  . APPENDECTOMY  1981  . BREAST BIOPSY  1984  . BREAST LUMPECTOMY  1982   right breast  . COLONOSCOPY  2008   Dr. Oletta Lamas: diverticulosis, hemorrhoids, conscious sedation  . COLONOSCOPY N/A 01/30/2016   Dr. Gala Romney: mild divertiuculosis in sigmoid colon. narrowing of the colon in association with the deverticular opening. 6 polyps removed, tubular adenoma. next TCS in 3 years  . CORONARY ANGIOPLASTY WITH STENT PLACEMENT  02/2008  . ESOPHAGOGASTRODUODENOSCOPY  2014   Dr. Fuller Plan: erosive gastritis, no h.pylori  . ESOPHAGOGASTRODUODENOSCOPY N/A 01/30/2016   Dr. Gala Romney: normal esophagus s/p dilation, small hh.  Marland Kitchen LEFT HEART CATHETERIZATION WITH CORONARY ANGIOGRAM N/A 12/02/2012   Procedure: LEFT HEART CATHETERIZATION WITH CORONARY ANGIOGRAM;  Surgeon: Jettie Booze, MD;  Location: [facility] CATH LAB;  Service: Cardiovascular;  Laterality: N/A;  . Venia Minks DILATION N/A 01/30/2016   Procedure: Venia Minks DILATION;  Surgeon: Daneil Dolin, MD;  Location: [facility] ENDO SUITE;  Service: Endoscopy;  Laterality: N/A;  . NASAL SINUS SURGERY    . PERCUTANEOUS CORONARY STENT INTERVENTION (PCI-S)  12/02/2012   Procedure: PERCUTANEOUS CORONARY  STENT INTERVENTION (PCI-S);  Surgeon: Jettie Booze, MD;  Location: [facility] CATH LAB;  Service: Cardiovascular;;  DES to the MID LAD    OB History    No data available       Home Medications    Prior to Admission medications   Medication Sig Start Date End Date Taking? Authorizing Provider  aspirin EC 81 MG tablet Take 81 mg by mouth every morning.   Yes [provider]  chlorpheniramine (CHLOR-TRIMETON) 4 MG  tablet Take 1 tablet (4 mg total) by mouth 3 (three) times daily with meals. 11/14/17  Yes Lily Kocher, PA-C  dexamethasone (DECADRON) 4 MG tablet Take 1 tablet (4 mg total) by mouth 2 (two) times daily. 11/14/17  Yes Lily Kocher, PA-C  isosorbide mononitrate (IMDUR) 30 MG 24 hr tablet Take 1 tablet (30 mg total) by mouth daily. 11/06/17 02/04/18 Yes Satira Sark, MD  latanoprost (XALATAN) 0.005 % ophthalmic solution Place 2 drops into both eyes at bedtime. 07/28/14  Yes [provider]  nitroGLYCERIN (NITROSTAT) 0.4 MG SL tablet Place 0.4 mg under the tongue every 5 (five) minutes x 3 doses as needed for chest pain. May repeat x3   Yes [provider]  pantoprazole (PROTONIX) 40 MG tablet Take 40 mg by mouth daily.   Yes [provider]  rosuvastatin (CRESTOR) 10 MG tablet Take 1 tablet (10 mg total) by mouth daily. OVERDUE FOR FOLLOW UP. CALL AND SCHEDULE [phone] 10/05/16  Yes Turner, Eber Hong, MD  HYDROcodone-homatropine (HYCODAN) 5-1.5 MG/5ML syrup Take 5 mLs by mouth every 6 (six) hours as needed. Patient not taking: Reported on 11/17/2017 11/14/17   Lily Kocher, PA-C    Family History Family History  Problem Relation Age of Onset  . Emphysema Father   . Emphysema Brother   . Heart disease Brother        x3 brothers  . Lung cancer Brother   . Throat cancer Brother        head/neck cancer  . Colon cancer Neg Hx     Social History Social History   Tobacco Use  . Smoking status: Never Smoker  . Smokeless tobacco: Never Used  . Tobacco comment: Widowed, lives alone but has sig other.   Substance Use Topics  . Alcohol use: Yes    Comment: social alcohol, glass of wine once a month  . Drug use: No     Allergies   Crestor [rosuvastatin]; Lipitor [atorvastatin]; Lisinopril; and Simvastatin   Review of Systems Review of Systems  Constitutional: Negative for fever.  HENT: Negative for congestion.   Eyes: Negative for redness.    Respiratory: Positive for cough. Negative for shortness of breath.   Cardiovascular: Negative for chest pain.  Gastrointestinal: Positive for abdominal pain and constipation. Negative for nausea and vomiting.  Genitourinary: Negative for dysuria.  Musculoskeletal: Negative for back pain.  Skin: Negative for rash.  Neurological: Negative for syncope and headaches.  Hematological: Does not bruise/bleed easily.  Psychiatric/Behavioral: Negative for confusion.     Physical Exam Updated Vital Signs BP (!) 123/58 (BP Location: Left Arm)   Pulse 85   Temp 97.6 F (36.4 C) (Oral)   Resp 16   Ht 1.6 m (5\' 3" )   Wt 63.5 kg (140 lb)   SpO2 93%   BMI 24.80 kg/m   Physical Exam  Constitutional: She is oriented to person, place, and time. She appears well-developed. She does not appear ill. No distress.  HENT:  Head: Normocephalic  and atraumatic.  Mucous membranes slightly dry  Eyes: EOM are normal. Pupils are equal, round, and reactive to light.  Neck: Neck supple.  Cardiovascular: Normal rate, regular rhythm and normal heart sounds.  Pulmonary/Chest: Effort normal and breath sounds normal. No respiratory distress.  Abdominal: Soft. Bowel sounds are normal. There is tenderness.  Tenderness to palpation left lower quadrant lower part of abdomen and some guarding.  Musculoskeletal: Normal range of motion. She exhibits no edema.  Neurological: She is alert and oriented to person, place, and time. No cranial nerve deficit or sensory deficit. She exhibits normal muscle tone. Coordination normal.  Skin: Skin is warm.  Nursing note and vitals reviewed.    ED Treatments / Results  Labs (all labs ordered are listed, but only abnormal results are displayed) Labs Reviewed  COMPREHENSIVE METABOLIC PANEL - Abnormal; Notable for the following components:      Result Value   Potassium 3.3 (*)    Glucose, Bld 155 (*)    BUN 24 (*)    Creatinine, Ser 1.12 (*)    GFR calc non Af Amer 46 (*)     GFR calc Af Amer 53 (*)    All other components within normal limits  CBC - Abnormal; Notable for the following components:   WBC 11.1 (*)    All other components within normal limits  URINALYSIS, ROUTINE W REFLEX MICROSCOPIC - Abnormal; Notable for the following components:   APPearance HAZY (*)    Nitrite POSITIVE (*)    Leukocytes, UA TRACE (*)    Bacteria, UA MANY (*)    Squamous Epithelial / LPF 0-5 (*)    All other components within normal limits  CBG MONITORING, ED - Abnormal; Notable for the following components:   Glucose-Capillary 158 (*)    All other components within normal limits  URINE CULTURE  LIPASE, BLOOD    EKG  EKG Interpretation None       Radiology Ct Abdomen Pelvis W Contrast  Result Date: 11/17/2017 CLINICAL DATA:  Patient c/o left lower abd pain that started yesterday. Patient states nausea and vomiting. Patient recently discharged from ER with diagnoses of Flu. Patient has not had BM x3 days. Patient used fleet enema, Miralax 2 suppositories with no relief. Unsure of any fevers. EXAM: CT ABDOMEN AND PELVIS WITH CONTRAST TECHNIQUE: Multidetector CT imaging of the abdomen and pelvis was performed using the standard protocol following bolus administration of intravenous contrast. CONTRAST:  80 ml ISOVUE-300 IOPAMIDOL (ISOVUE-300) INJECTION 61% COMPARISON:  08/17/2016 FINDINGS: Lower chest: No acute findings. Mild lung base atelectasis and/or scarring. Stable 4 mm nodule in the left lower lobe. Heart normal in size. Coronary artery calcifications. Hepatobiliary: No focal liver abnormality is seen. No gallstones, gallbladder wall thickening, or biliary dilatation. Pancreas: Unremarkable. No pancreatic ductal dilatation or surrounding inflammatory changes. Spleen: Normal in size without focal abnormality. Adrenals/Urinary Tract: Adrenal glands are unremarkable. Kidneys are normal, without renal calculi, focal lesion, or hydronephrosis. Bladder is unremarkable.  Stomach/Bowel: There is a small amount of free intraperitoneal air. There is wall thickening and adjacent inflammation along the proximal to mid sigmoid colon where there are numerous diverticula. Extraluminal air extends from the region of the junction of the descending and sigmoid colon into the left anterior lower quadrant peritoneal cavity associated with a small amount of fluid. There are numerous additional diverticula along the left colon with no other inflammatory changes. The right colon is mildly distended. There is no wall thickening or adjacent inflammation. Stomach and  small bowel are unremarkable.  Appendix not visualized. Vascular/Lymphatic: Aortic atherosclerosis. No enlarged abdominal or pelvic lymph nodes. Reproductive: Uterus and bilateral adnexa are unremarkable. Other: There is no formed fluid collection to suggest an abscess. Most of the free intraperitoneal air collects along the anterior mid to upper abdomen and underneath the hemidiaphragms. Musculoskeletal: No fracture or acute finding. No osteoblastic or osteolytic lesions. IMPRESSION: 1. Findings are consistent with complicated diverticulitis. There are inflammatory changes along the proximal to mid sigmoid colon where there are numerous diverticula. There is free intraperitoneal air that collects anteriorly and underneath hemidiaphragms, presumably arising from a ruptured sigmoid diverticulum. A small amount of free fluid is noted in the left lower quadrant, without a defined abscess. 2. No other acute abnormalities within the abdomen or pelvis. Electronically Signed   By: Lajean Manes M.D.   On: 11/17/2017 20:27    Procedures Procedures (including critical care time)  Medications Ordered in ED Medications  0.9 %  sodium chloride infusion ( Intravenous New Bag/Given 11/17/17 1708)  ciprofloxacin (CIPRO) IVPB 400 mg (not administered)  metroNIDAZOLE (FLAGYL) IVPB 500 mg (not administered)  HYDROmorphone (DILAUDID) injection 0.5  mg (0.5 mg Intravenous Given 11/17/17 1707)  ondansetron (ZOFRAN) injection 4 mg (4 mg Intravenous Given 11/17/17 1706)  sodium chloride 0.9 % bolus 500 mL (0 mLs Intravenous Stopped 11/17/17 1919)  iopamidol (ISOVUE-300) 61 % injection 80 mL ( Intravenous Contrast Given 11/17/17 2004)     Initial Impression / Assessment and Plan / ED Course  I have reviewed the triage vital signs and the nursing notes.  Pertinent labs & imaging results that were available during my care of the patient were reviewed by me and considered in my medical decision making (see chart for details).     Patient's workup consistent with comp located diverticulitis.  Patient has a small amount of free air under the diaphragms.  Discussed with Dr. Arnoldo Morale general surgery he will consult.  Hospitalist will admit.  Patient will be started on Cipro and Flagyl.  Patient's urine was also positive nitrite urine culture sent.  Cipro should cover initially.  Flu test sent just since patient is being admitted but her symptoms have significantly improved.  Patient does have a history of coronary disease hospitalist aware.  Patient nontoxic no acute distress.  Does have tenderness left lower quadrant with a slight bit of guarding.  Mild leukocytosis.  Electrolytes without significant abnormalities.  Final Clinical Impressions(s) / ED Diagnoses   Final diagnoses:  Diverticulitis of large intestine with perforation without abscess or bleeding    ED Discharge Orders    None       Fredia Sorrow, MD 11/17/17 2143    Fredia Sorrow, MD 11/17/17 [phone]

## 2017-11-17 NOTE — ED Triage Notes (Signed)
Patient c/o left lower abd pain that started yesterday. Patient states nausea and vomiting. Patient recently discharged from ER with diagnoses of Flu. Patient has not had BM x3 days. Patient used fleet enema, Miralax 2 suppositories with no relief. Unsure of any fevers.

## 2017-11-17 NOTE — H&P (Signed)
TRH H&P    Patient Demographics:    Dorothy Lee, is a 77 y.o. female  MRN: [id number]  DOB - 02/25/1941  Admit Date - 11/17/2017  Referring MD/NP/PA: Dr. Rogene Houston  Outpatient Primary MD for the patient is Bretta Bang, MD  Patient coming from: home  Chief complaint-abdominal pain   HPI:    Dorothy Lee  is a 77 y.o. female, with history of breast cancer status post lumpectomy, radiation treatment in 1984, diverticulosis, essential hypertension, coronary artery disease status post stent time three, glaucoma, Jerrye Bushy came to hospital with abdominal pain. Patient also complained of constipation. She was seen in the ED on 7 February at that time she was diagnosed with influenza and prescribed Decadron along with hydrocodone cough syrup.  Patient says that the pain developed this morning was 10/10 intensity and has improved after she received Dilaudid in the ED. She denies vomiting. Denies diarrhea. No chest pain or shortness of breath. In the ED CT of the abdomen pelvis was done which showed diverticulitis with perforation. General surgeon Dr. Arnoldo Morale was consulted by ED physician , who recommends IV antibiotics and keeping patient NPO. He will see the patient in a.m.    Review of systems:      All other systems reviewed and are negative.   With Past History of the following :    Past Medical History:  Diagnosis Date  . Allergic rhinitis   . CAD (coronary artery disease)    DES RCA and LAD 02/2008, DES mid LAD 11/2012  . Cancer (HCC)    Rt Breast  . Cough   . Diverticulosis   . Essential hypertension   . GERD (gastroesophageal reflux disease)   . Glaucoma   . History of breast cancer    Right - s/p XRT 1984, surgery, no chemo  . Hyperlipidemia   . Rotator cuff syndrome, left   . Scoliosis   . Urinary incontinence       Past Surgical History:  Procedure Laterality Date  . APPENDECTOMY   1981  . BREAST BIOPSY  1984  . BREAST LUMPECTOMY  1982   right breast  . COLONOSCOPY  2008   Dr. Oletta Lamas: diverticulosis, hemorrhoids, conscious sedation  . COLONOSCOPY N/A 01/30/2016   Dr. Gala Romney: mild divertiuculosis in sigmoid colon. narrowing of the colon in association with the deverticular opening. 6 polyps removed, tubular adenoma. next TCS in 3 years  . CORONARY ANGIOPLASTY WITH STENT PLACEMENT  02/2008  . ESOPHAGOGASTRODUODENOSCOPY  2014   Dr. Fuller Plan: erosive gastritis, no h.pylori  . ESOPHAGOGASTRODUODENOSCOPY N/A 01/30/2016   Dr. Gala Romney: normal esophagus s/p dilation, small hh.  Marland Kitchen LEFT HEART CATHETERIZATION WITH CORONARY ANGIOGRAM N/A 12/02/2012   Procedure: LEFT HEART CATHETERIZATION WITH CORONARY ANGIOGRAM;  Surgeon: Jettie Booze, MD;  Location: [facility] CATH LAB;  Service: Cardiovascular;  Laterality: N/A;  . Venia Minks DILATION N/A 01/30/2016   Procedure: Venia Minks DILATION;  Surgeon: Daneil Dolin, MD;  Location: [facility] ENDO SUITE;  Service: Endoscopy;  Laterality: N/A;  . NASAL SINUS SURGERY    . PERCUTANEOUS  CORONARY STENT INTERVENTION (PCI-S)  12/02/2012   Procedure: PERCUTANEOUS CORONARY STENT INTERVENTION (PCI-S);  Surgeon: Jettie Booze, MD;  Location: [facility] CATH LAB;  Service: Cardiovascular;;  DES to the MID LAD      Social History:      Social History   Tobacco Use  . Smoking status: Never Smoker  . Smokeless tobacco: Never Used  . Tobacco comment: Widowed, lives alone but has sig other.   Substance Use Topics  . Alcohol use: Yes    Comment: social alcohol, glass of wine once a month       Family History :     Family History  Problem Relation Age of Onset  . Emphysema Father   . Emphysema Brother   . Heart disease Brother        x3 brothers  . Lung cancer Brother   . Throat cancer Brother        head/neck cancer  . Colon cancer Neg Hx       Home Medications:   Prior to Admission medications   Medication Sig Start Date End Date Taking? Authorizing  Provider  aspirin EC 81 MG tablet Take 81 mg by mouth every morning.   Yes [provider]  chlorpheniramine (CHLOR-TRIMETON) 4 MG tablet Take 1 tablet (4 mg total) by mouth 3 (three) times daily with meals. 11/14/17  Yes Lily Kocher, PA-C  dexamethasone (DECADRON) 4 MG tablet Take 1 tablet (4 mg total) by mouth 2 (two) times daily. 11/14/17  Yes Lily Kocher, PA-C  isosorbide mononitrate (IMDUR) 30 MG 24 hr tablet Take 1 tablet (30 mg total) by mouth daily. 11/06/17 02/04/18 Yes Satira Sark, MD  latanoprost (XALATAN) 0.005 % ophthalmic solution Place 2 drops into both eyes at bedtime. 07/28/14  Yes [provider]  nitroGLYCERIN (NITROSTAT) 0.4 MG SL tablet Place 0.4 mg under the tongue every 5 (five) minutes x 3 doses as needed for chest pain. May repeat x3   Yes [provider]  pantoprazole (PROTONIX) 40 MG tablet Take 40 mg by mouth daily.   Yes [provider]  rosuvastatin (CRESTOR) 10 MG tablet Take 1 tablet (10 mg total) by mouth daily. OVERDUE FOR FOLLOW UP. CALL AND SCHEDULE [phone] 10/05/16  Yes Turner, Eber Hong, MD  HYDROcodone-homatropine (HYCODAN) 5-1.5 MG/5ML syrup Take 5 mLs by mouth every 6 (six) hours as needed. Patient not taking: Reported on 11/17/2017 11/14/17   Lily Kocher, PA-C     Allergies:     Allergies  Allergen Reactions  . Crestor [Rosuvastatin]     Crestor 20 mg caused muscle aches - pt is able to tolerate lower doses (takes 10mg )  . Lipitor [Atorvastatin]     Atorvastatin 40 mg caused fatigue  . Lisinopril Cough  . Simvastatin     Muscle aches     Physical Exam:   Vitals  Blood pressure 122/63, pulse 85, temperature 97.6 F (36.4 C), temperature source Oral, resp. rate 15, height 5\' 3"  (1.6 m), weight 63.5 kg (140 lb), SpO2 94 %.  1.  General:  appears in no acute distress  2. Psychiatric:  Intact judgement and  insight, awake alert, oriented x 3.  3. Neurologic: No focal neurological deficits, all  cranial nerves intact.Strength 5/5 all 4 extremities, sensation intact all 4 extremities, plantars down going.  4. Eyes :  anicteric sclerae, moist conjunctivae with no lid lag. PERRLA.  5. ENMT:  Oropharynx clear with moist mucous membranes and good dentition  6. Neck:  supple, no cervical lymphadenopathy appriciated, No thyromegaly  7. Respiratory : Normal respiratory effort, good air movement bilaterally,clear to  auscultation bilaterally  8. Cardiovascular : RRR, no gallops, rubs or murmurs, no leg edema  9. Gastrointestinal:  Positive bowel sounds, abdomen soft, tenderness to palpation in the left lower quadrant. Mild guarding, no rigidity.  10. Skin:  No cyanosis, normal texture and turgor, no rash, lesions or ulcers  11.Musculoskeletal:  Good muscle tone,  joints appear normal , no effusions,  normal range of motion    Data Review:    CBC Recent Labs  Lab 11/17/17 1403  WBC 11.1*  HGB 13.4  HCT 42.8  PLT 309  MCV 92.8  MCH 29.1  MCHC 31.3  RDW 13.4   ------------------------------------------------------------------------------------------------------------------  Chemistries  Recent Labs  Lab 11/17/17 1403  NA 139  K 3.3*  CL 102  CO2 27  GLUCOSE 155*  BUN 24*  CREATININE 1.12*  CALCIUM 9.1  AST 31  ALT 25  ALKPHOS 66  BILITOT 0.6   ------------------------------------------------------------------------------------------------------------------  ------------------------------------------------------------------------------------------------------------------ GFR: Estimated Creatinine Clearance: 37.7 mL/min (A) (by C-G formula based on SCr of 1.12 mg/dL (H)). Liver Function Tests: Recent Labs  Lab 11/17/17 1403  AST 31  ALT 25  ALKPHOS 66  BILITOT 0.6  PROT 7.0  ALBUMIN 3.6   Recent Labs  Lab 11/17/17 1403  LIPASE 20   CBG: Recent Labs  Lab 11/17/17 1130  GLUCAP 158*      --------------------------------------------------------------------------------------------------------------- Urine analysis:    Component Value Date/Time   COLORURINE YELLOW 11/17/2017 1859   APPEARANCEUR HAZY (A) 11/17/2017 1859   LABSPEC 1.014 11/17/2017 1859   PHURINE 5.0 11/17/2017 1859   GLUCOSEU NEGATIVE 11/17/2017 1859   HGBUR NEGATIVE 11/17/2017 1859   BILIRUBINUR NEGATIVE 11/17/2017 1859   KETONESUR NEGATIVE 11/17/2017 1859   PROTEINUR NEGATIVE 11/17/2017 1859   UROBILINOGEN 0.2 07/25/2011 1454   NITRITE POSITIVE (A) 11/17/2017 1859   LEUKOCYTESUR TRACE (A) 11/17/2017 1859      Imaging Results:    Ct Abdomen Pelvis W Contrast  Result Date: 11/17/2017 CLINICAL DATA:  Patient c/o left lower abd pain that started yesterday. Patient states nausea and vomiting. Patient recently discharged from ER with diagnoses of Flu. Patient has not had BM x3 days. Patient used fleet enema, Miralax 2 suppositories with no relief. Unsure of any fevers. EXAM: CT ABDOMEN AND PELVIS WITH CONTRAST TECHNIQUE: Multidetector CT imaging of the abdomen and pelvis was performed using the standard protocol following bolus administration of intravenous contrast. CONTRAST:  80 ml ISOVUE-300 IOPAMIDOL (ISOVUE-300) INJECTION 61% COMPARISON:  08/17/2016 FINDINGS: Lower chest: No acute findings. Mild lung base atelectasis and/or scarring. Stable 4 mm nodule in the left lower lobe. Heart normal in size. Coronary artery calcifications. Hepatobiliary: No focal liver abnormality is seen. No gallstones, gallbladder wall thickening, or biliary dilatation. Pancreas: Unremarkable. No pancreatic ductal dilatation or surrounding inflammatory changes. Spleen: Normal in size without focal abnormality. Adrenals/Urinary Tract: Adrenal glands are unremarkable. Kidneys are normal, without renal calculi, focal lesion, or hydronephrosis. Bladder is unremarkable. Stomach/Bowel: There is a small amount of free intraperitoneal air.  There is wall thickening and adjacent inflammation along the proximal to mid sigmoid colon where there are numerous diverticula. Extraluminal air extends from the region of the junction of the descending and sigmoid colon into the left anterior lower quadrant peritoneal cavity associated with a small amount of fluid. There are numerous additional diverticula along the left colon with no other inflammatory changes. The right colon is  mildly distended. There is no wall thickening or adjacent inflammation. Stomach and small bowel are unremarkable.  Appendix not visualized. Vascular/Lymphatic: Aortic atherosclerosis. No enlarged abdominal or pelvic lymph nodes. Reproductive: Uterus and bilateral adnexa are unremarkable. Other: There is no formed fluid collection to suggest an abscess. Most of the free intraperitoneal air collects along the anterior mid to upper abdomen and underneath the hemidiaphragms. Musculoskeletal: No fracture or acute finding. No osteoblastic or osteolytic lesions. IMPRESSION: 1. Findings are consistent with complicated diverticulitis. There are inflammatory changes along the proximal to mid sigmoid colon where there are numerous diverticula. There is free intraperitoneal air that collects anteriorly and underneath hemidiaphragms, presumably arising from a ruptured sigmoid diverticulum. A small amount of free fluid is noted in the left lower quadrant, without a defined abscess. 2. No other acute abnormalities within the abdomen or pelvis. Electronically Signed   By: Lajean Manes M.D.   On: 11/17/2017 20:27       Assessment & Plan:    Active Problems:   BREAST CANCER, HX OF   Diverticulitis  hypokalemia    1. Complicated diverticulitis with perforation- patient presents with left lower quadrant pain with CT abdomen showing completely diverticulitis with perforation. There is small amount of free air under the diaphragm. General surgery has been consulted, plan is to keep NPO. Initially  Cipro and Flagyl were ordered but patient developed redness in the arm, will discontinue these antibiotics and start Zosyn per pharmacy consultation.  2. UTI-patient has abnormal UA with positive nitrate, urine culture has been obtained. IV Zosyn has been started as above. Follow urine culture results. 3. Coronary artery disease status post stent's times three- patient is currently on aspirin and cholesterol. Will hold these  Medications. She denies any chest pain. Will obtain EKG. 4. Hypokalemia-potassium is 3.3, replace potassium and check BMP in am. 5. ? Influenza-patient was recently treated for upper respiratory infection, questionable influenza. Influenza PCR has been ordered. Will discontinue Decadron, hydrocodone cough syrup.    DVT Prophylaxis-   Lovenox  AM Labs Ordered, also please review Full Orders  Family Communication: Admission, patients condition and plan of care including tests being ordered have been discussed with the patient who indicate understanding and agree with the plan and Code Status.  Code Status:  full code  Admission status: inpatient  Time spent in minutes : 60 minutes   Oswald Hillock M.D on 11/17/2017 at 10:00 PM  Between 7am to 7pm - Pager - [phone]. After 7pm go to [email or web address] - password [facility]  [facility] - Office  [phone]

## 2017-11-17 NOTE — ED Notes (Signed)
Not in [place] when lab called

## 2017-11-17 NOTE — Progress Notes (Addendum)
Pharmacy Antibiotic Note  Dorothy Lee is a 77 y.o. female admitted on 11/17/2017 with intra-abdominal infection.  Pharmacy has been consulted for zosyn dosing.  Plan: Zosyn 3.375g IV q8h (4 hour infusion).  F/U cxs and clinical progress Monitor V/S, labs  Height: 5\' 3"  (160 cm) Weight: 140 lb (63.5 kg) IBW/kg (Calculated) : 52.4  Temp (24hrs), Avg:97.6 F (36.4 C), Min:97.6 F (36.4 C), Max:97.6 F (36.4 C)  Recent Labs  Lab 11/17/17 1403  WBC 11.1*  CREATININE 1.12*    Estimated Creatinine Clearance: 37.7 mL/min (A) (by C-G formula based on SCr of 1.12 mg/dL (H)).    Allergies  Allergen Reactions  . Crestor [Rosuvastatin]     Crestor 20 mg caused muscle aches - pt is able to tolerate lower doses (takes 10mg )  . Lipitor [Atorvastatin]     Atorvastatin 40 mg caused fatigue  . Lisinopril Cough  . Simvastatin     Muscle aches    Antimicrobials this admission: Zosyn 2/10 >>   Dose adjustments this admission:n/a  Microbiology results: 2/10 UCx: pending 2/10 Influenza: pending  Thank you for allowing pharmacy to be a part of this patient's care.  Isac Sarna, BS Pharm D, BCPS Clinical Pharmacist Pager [phone] 11/17/2017 10:00 PM

## 2017-11-18 DIAGNOSIS — K5720 Diverticulitis of large intestine with perforation and abscess without bleeding: Secondary | ICD-10-CM

## 2017-11-18 LAB — COMPREHENSIVE METABOLIC PANEL
ALBUMIN: 2.7 g/dL — AB (ref 3.5–5.0)
ALT: 19 U/L (ref 14–54)
AST: 20 U/L (ref 15–41)
Alkaline Phosphatase: 56 U/L (ref 38–126)
Anion gap: 8 (ref 5–15)
BILIRUBIN TOTAL: 1 mg/dL (ref 0.3–1.2)
BUN: 13 mg/dL (ref 6–20)
CHLORIDE: 105 mmol/L (ref 101–111)
CO2: 24 mmol/L (ref 22–32)
CREATININE: 0.84 mg/dL (ref 0.44–1.00)
Calcium: 8.1 mg/dL — ABNORMAL LOW (ref 8.9–10.3)
GFR calc Af Amer: >60 mL/min (ref >60)
GLUCOSE: 103 mg/dL — AB (ref 65–99)
Potassium: 3.6 mmol/L (ref 3.5–5.1)
Sodium: 137 mmol/L (ref 135–145)
TOTAL PROTEIN: 5.6 g/dL — AB (ref 6.5–8.1)

## 2017-11-18 LAB — CBC
HEMATOCRIT: 38.9 % (ref 36.0–46.0)
Hemoglobin: 12.4 g/dL (ref 12.0–15.0)
MCH: 29.9 pg (ref 26.0–34.0)
MCHC: 31.9 g/dL (ref 30.0–36.0)
MCV: 93.7 fL (ref 78.0–100.0)
Platelets: 245 10*3/uL (ref 150–400)
RBC: 4.15 MIL/uL (ref 3.87–5.11)
RDW: 13.7 % (ref 11.5–15.5)
WBC: 8.3 10*3/uL (ref 4.0–10.5)

## 2017-11-18 LAB — INFLUENZA PANEL BY PCR (TYPE A & B)
INFLAPCR: NEGATIVE
Influenza B By PCR: NEGATIVE

## 2017-11-18 MED ORDER — HYDROMORPHONE HCL 1 MG/ML IJ SOLN
1.0000 mg | INTRAMUSCULAR | Status: DC | PRN
Start: 2017-11-18 — End: 2017-11-19
  Administered 2017-11-18: 1 mg via INTRAMUSCULAR
  Filled 2017-11-18: qty 1

## 2017-11-18 MED ORDER — ONDANSETRON 4 MG PO TBDP: 4.0000 mg | ORAL_TABLET | Freq: Four times a day (QID) | ORAL | Status: DC | PRN

## 2017-11-18 NOTE — Progress Notes (Signed)
Unable to obtain IV access after 3 different nurses tried. Dr. Hilbert Bible paged and made aware. Adv Dr. Hilbert Bible that she has IV abx ordered as well as IV pain medication that pt is requesting.  Waiting for orders/call back.

## 2017-11-18 NOTE — Progress Notes (Signed)
Dr. Hilbert Bible placed an order for Dilaudid IM q4h as well as Zofran po. RN paged Dr. Hilbert Bible again to see if she wanted to place an order for PICC

## 2017-11-18 NOTE — Consult Note (Signed)
Reason for Consult: Perforated diverticulosis Referring Physician: Dr. Tandy Gaw is an 77 y.o. female.  HPI: Patient is a 77 year old white female with a known history of extensive diverticulosis who presents with a 4-5-day history of worsening left lower quadrant abdominal pain.  She states she has had this on and off for many weeks now.  She has been seen by [facility] in the past for left lower quadrant abdominal pain.  She was last seen by them October 22, 2017.  She states she is also had the flu since she has seen them.  Her bowel movements have been normal.  She denies any fever or chills over the past few days, though she has a residual cough.  She currently states her pain has decreased to 3 out of 10 when lying in the bed.  CT scan of the abdomen was performed which revealed perforated sigmoid diverticulosis with some pneumoperitoneum under the diaphragms.  She was admitted to the hospital for further evaluation and treatment.  This morning, she states her abdominal pain has significantly gotten better.  She denies any diarrhea, constipation, or blood per rectum.  Past Medical History:  Diagnosis Date  . Allergic rhinitis   . CAD (coronary artery disease)    DES RCA and LAD 02/2008, DES mid LAD 11/2012  . Cancer (HCC)    Rt Breast  . Cough   . Diverticulosis   . Essential hypertension   . GERD (gastroesophageal reflux disease)   . Glaucoma   . History of breast cancer    Right - s/p XRT 1984, surgery, no chemo  . Hyperlipidemia   . Rotator cuff syndrome, left   . Scoliosis   . Urinary incontinence     Past Surgical History:  Procedure Laterality Date  . APPENDECTOMY  1981  . BREAST BIOPSY  1984  . BREAST LUMPECTOMY  1982   right breast  . COLONOSCOPY  2008   Dr. Oletta Lamas: diverticulosis, hemorrhoids, conscious sedation  . COLONOSCOPY N/A 01/30/2016   Dr. Gala Romney: mild divertiuculosis in sigmoid colon. narrowing of the colon in association with the deverticular opening.  6 polyps removed, tubular adenoma. next TCS in 3 years  . CORONARY ANGIOPLASTY WITH STENT PLACEMENT  02/2008  . ESOPHAGOGASTRODUODENOSCOPY  2014   Dr. Fuller Plan: erosive gastritis, no h.pylori  . ESOPHAGOGASTRODUODENOSCOPY N/A 01/30/2016   Dr. Gala Romney: normal esophagus s/p dilation, small hh.  Marland Kitchen LEFT HEART CATHETERIZATION WITH CORONARY ANGIOGRAM N/A 12/02/2012   Procedure: LEFT HEART CATHETERIZATION WITH CORONARY ANGIOGRAM;  Surgeon: Jettie Booze, MD;  Location: [facility] C[facility] LAB;  Service: Cardiovascular;  Laterality: N/A;  . Venia Minks DILATION N/A 01/30/2016   Procedure: Venia Minks DILATION;  Surgeon: Daneil Dolin, MD;  Location: [facility] END[facility] SUITE;  Service: Endoscopy;  Laterality: N/A;  . NASAL SINUS SURGERY    . PERCUTANEOUS CORONARY STENT INTERVENTION (PCI-S)  12/02/2012   Procedure: PERCUTANEOUS CORONARY STENT INTERVENTION (PCI-S);  Surgeon: Jettie Booze, MD;  Location: [facility] CATH LAB;  Service: Cardiovascular;;  DES to the MID LAD    Family History  Problem Relation Age of Onset  . Emphysema Father   . Emphysema Brother   . Heart disease Brother        x3 brothers  . Lung cancer Brother   . Throat cancer Brother        head/neck cancer  . Colon cancer Neg Hx     Social History:  reports that  has never smoked. she has never used smokeless tobacco.  She reports that she drinks alcohol. She reports that she does not use drugs.  Allergies:  Allergies  Allergen Reactions  . Crestor [Rosuvastatin]     Crestor 20 mg caused muscle aches - pt is able to tolerate lower doses (takes 13m)  . Lipitor [Atorvastatin]     Atorvastatin 40 mg caused fatigue  . Lisinopril Cough  . Simvastatin     Muscle aches    Medications: I have reviewed the patient's current medications.  Results for orders placed or performed during the hospital encounter of 11/17/17 (from the past 48 hour(s))  CBG monitoring, ED     Status: Abnormal   Collection Time: 11/17/17 11:30 AM  Result Value Ref Range    Glucose-Capillary 158 (H) 65 - 99 mg/dL  Lipase, blood     Status: None   Collection Time: 11/17/17  2:03 PM  Result Value Ref Range   Lipase 20 11 - 51 U/L    Comment: [facility]formed at [facility] [address], [place] [id number] Comprehensive metabolic panel     Status: Abnormal   Collection Time: 11/17/17  2:03 PM  Result Value Ref Range   Sodium 139 135 - 145 mmol/L   Potassium 3.3 (L) 3.5 - 5.1 mmol/L   Chloride 102 101 - 111 mmol/L   CO2 27 22 - 32 mmol/L   Glucose, Bld 155 (H) 65 - 99 mg/dL   BUN 24 (H) 6 - 20 mg/dL   Creatinine, Ser 1.12 (H) 0.44 - 1.00 mg/dL   Calcium 9.1 8.9 - 10.3 mg/dL   Total Protein 7.0 6.5 - 8.1 g/dL   Albumin 3.6 3.5 - 5.0 g/dL   AST 31 15 - 41 U/L   ALT 25 14 - 54 U/L   Alkaline Phosphatase 66 38 - 126 U/L   Total Bilirubin 0.6 0.3 - 1.2 mg/dL   GFR calc non Af Amer 46 (L) >60 mL/min   GFR calc Af Amer 53 (L) >60 mL/min    Comment: (NOTE) The eGFR has been calculated using the CKD EPI equation. This calculation has not been validated in all clinical situations. eGFR's persistently <60 mL/min signify possible Chronic Kidney Disease.    Anion gap 10 5 - 15    Comment: [facility]formed at [facility] [address] [place] [address] CBC     Status: Abnormal   Collection Time: 11/17/17  2:03 PM  Result Value Ref Range   WBC 11.1 (H) 4.0 - 10.5 K/uL   RBC 4.61 3.87 - 5.11 MIL/uL   Hemoglobin 13.4 12.0 - 15.0 g/dL   HCT 42.8 36.0 - 46.0 %   MCV 92.8 78.0 - 100.0 fL   MCH 29.1 26.0 - 34.0 pg   MCHC 31.3 30.0 - 36.0 g/dL   RDW 13.4 11.5 - 15.5 %   Platelets 309 150 - 400 K/uL    Comment: Performed at [facility] [address] [place] [address] Urinalysis, Routine w reflex microscopic     Status: Abnormal   Collection Time: 11/17/17  6:59 PM  Result Value Ref Range   Color, Urine YELLOW YELLOW   APPearance HAZY (A) CLEAR   Specific Gravity, Urine 1.014 1.005 - 1.030   pH 5.0 5.0 - 8.0   Glucose, UA NEGATIVE  NEGATIVE mg/dL   Hgb urine dipstick NEGATIVE NEGATIVE   Bilirubin Urine NEGATIVE NEGATIVE   Ketones, ur NEGATIVE NEGATIVE mg/dL   Protein, ur NEGATIVE NEGATIVE mg/dL   Nitrite  POSITIVE (A) NEGATIVE   Leukocytes, UA TRACE (A) NEGATIVE   RBC / HPF 0-5 0 - 5 RBC/hpf   WBC, UA 6-30 0 - 5 WBC/hpf   Bacteria, UA MANY (A) NONE SEEN   Squamous Epithelial / LPF 0-5 (A) NONE SEEN   Mucus PRESENT    Hyaline Casts, UA PRESENT     Comment: Performed at [facility], [address], [place], [place] [address]  Influenza panel by PCR (type A & B)     Status: None   Collection Time: 11/17/17  9:28 PM  Result Value Ref Range   Influenza A By PCR NEGATIVE NEGATIVE   Influenza B By PCR NEGATIVE NEGATIVE    Comment: (NOTE) The Xpert Xpress Flu assay is intended as an aid in the diagnosis of  influenza and should not be used as a sole basis for treatment.  This  assay is FDA approved for nasopharyngeal swab specimens only. Nasal  washings and aspirates are unacceptable for Xpert Xpress Flu testing. Performed at [facility], [address], [place], [place] [address]   CBC     Status: None   Collection Time: 11/18/17  5:27 AM  Result Value Ref Range   WBC 8.3 4.0 - 10.5 K/uL   RBC 4.15 3.87 - 5.11 MIL/uL   Hemoglobin 12.4 12.0 - 15.0 g/dL   HCT 38.9 36.0 - 46.0 %   MCV 93.7 78.0 - 100.0 fL   MCH 29.9 26.0 - 34.0 pg   MCHC 31.9 30.0 - 36.0 g/dL   RDW 13.7 11.5 - 15.5 %   Platelets 245 150 - 400 K/uL    Comment: Performed at [facility], [address] [place], [place] [address]  Comprehensive metabolic panel     Status: Abnormal   Collection Time: 11/18/17  5:27 AM  Result Value Ref Range   Sodium 137 135 - 145 mmol/L   Potassium 3.6 3.5 - 5.1 mmol/L   Chloride 105 101 - 111 mmol/L   CO2 24 22 - 32 mmol/L   Glucose, Bld 103 (H) 65 - 99 mg/dL   BUN 13 6 - 20 mg/dL   Creatinine, Ser 0.84 0.44 - 1.00 mg/dL   Calcium 8.1 (L) 8.9 - 10.3 mg/dL   Total Protein 5.6 (L) 6.5 - 8.1 g/dL   Albumin  2.7 (L) 3.5 - 5.0 g/dL   AST 20 15 - 41 U/L   ALT 19 14 - 54 U/L   Alkaline Phosphatase 56 38 - 126 U/L   Total Bilirubin 1.0 0.3 - 1.2 mg/dL   GFR calc non Af Amer >60 >60 mL/min   GFR calc Af Amer >60 >60 mL/min    Comment: (NOTE) The eGFR has been calculated using the CKD EPI equation. This calculation has not been validated in all clinical situations. eGFR's persistently <60 mL/min signify possible Chronic Kidney Disease.    Anion gap 8 5 - 15    Comment: Performed at [facility], [address] [place], [place] [address]    Ct Abdomen Pelvis W Contrast  Result Date: 11/17/2017 CLINICAL DATA:  Patient c/o left lower abd pain that started yesterday. Patient states nausea and vomiting. Patient recently discharged from ER with diagnoses of Flu. Patient has not had BM x3 days. Patient used fleet enema, Miralax 2 suppositories with no relief. Unsure of any fevers. EXAM: CT ABDOMEN AND PELVIS WITH CONTRAST TECHNIQUE: Multidetector CT imaging of the abdomen and pelvis was performed using the standard protocol following bolus administration  of intravenous contrast. CONTRAST:  80 ml ISOVUE-300 IOPAMIDOL (ISOVUE-300) INJECTION 61% COMPARISON:  08/17/2016 FINDINGS: Lower chest: No acute findings. Mild lung base atelectasis and/or scarring. Stable 4 mm nodule in the left lower lobe. Heart normal in size. Coronary artery calcifications. Hepatobiliary: No focal liver abnormality is seen. No gallstones, gallbladder wall thickening, or biliary dilatation. Pancreas: Unremarkable. No pancreatic ductal dilatation or surrounding inflammatory changes. Spleen: Normal in size without focal abnormality. Adrenals/Urinary Tract: Adrenal glands are unremarkable. Kidneys are normal, without renal calculi, focal lesion, or hydronephrosis. Bladder is unremarkable. Stomach/Bowel: There is a small amount of free intraperitoneal air. There is wall thickening and adjacent inflammation along the proximal to mid sigmoid colon  where there are numerous diverticula. Extraluminal air extends from the region of the junction of the descending and sigmoid colon into the left anterior lower quadrant peritoneal cavity associated with a small amount of fluid. There are numerous additional diverticula along the left colon with no other inflammatory changes. The right colon is mildly distended. There is no wall thickening or adjacent inflammation. Stomach and small bowel are unremarkable.  Appendix not visualized. Vascular/Lymphatic: Aortic atherosclerosis. No enlarged abdominal or pelvic lymph nodes. Reproductive: Uterus and bilateral adnexa are unremarkable. Other: There is no formed fluid collection to suggest an abscess. Most of the free intraperitoneal air collects along the anterior mid to upper abdomen and underneath the hemidiaphragms. Musculoskeletal: No fracture or acute finding. No osteoblastic or osteolytic lesions. IMPRESSION: 1. Findings are consistent with complicated diverticulitis. There are inflammatory changes along the proximal to mid sigmoid colon where there are numerous diverticula. There is free intraperitoneal air that collects anteriorly and underneath hemidiaphragms, presumably arising from a ruptured sigmoid diverticulum. A small amount of free fluid is noted in the left lower quadrant, without a defined abscess. 2. No other acute abnormalities within the abdomen or pelvis. Electronically Signed   By: Lajean Manes M.D.   On: 11/17/2017 20:27    ROS:  Pertinent items are noted in HPI.  Blood pressure (!) 147/42, pulse 89, temperature 98.7 F (37.1 C), temperature source Oral, resp. rate 16, height 5' 3"  (1.6 m), weight 140 lb (63.5 kg), SpO2 94 %. Physical Exam: Pleasant well-developed well-nourished white female no acute distress Head is normocephalic, atraumatic Lungs clear to auscultation with equal breath sounds bilaterally Heart examination reveals regular rate and rhythm without S3, S4, murmurs Abdomen  soft with tenderness to palpation noted in the left lower quadrant.  She does not have rigidity.  She is not particularly distended.  No hepatosplenomegaly is noted.  CT scan images personally reviewed  Assessment/Plan: Impression: Perforated diverticulosis.  It is difficult to a certain exactly when the perforation occurred.  She has no peritoneal signs and a normal white blood cell count.  At this point, I would like to wait on any emergent surgical intervention.  I did tell the patient that she may need a partial colectomy in the future. Plan: Continue bowel rest and IV antibiotics.  Will follow her expectantly with you.  Aviva Signs 11/18/2017, 10:18 AM

## 2017-11-18 NOTE — Progress Notes (Signed)
RN spoke with Dr. Hilbert Bible about PICC line. Dr. Hilbert Bible was unaware we did not have an IV team here at this hospital. Adv that I would page her after I got one more RN to try to start IV. Dr. Hilbert Bible stated if this nurse was unsuccessful to page doctor and make her aware so she could substitute the antibiotic. Waiting for CN to start IV.

## 2017-11-18 NOTE — Progress Notes (Signed)
PROGRESS NOTE    Dorothy Lee  AST:[id number] DOB: [birth year] DOA: 11/17/2017 PCP: Bretta Bang, MD     Brief Narrative:  77 year old woman admitted from home on 2/10 due to abdominal pain.  Was found on CT scan to have evidence of sigmoid diverticulitis with free air under the diaphragm.  Admission requested.   Assessment & Plan:   Active Problems:   BREAST CANCER, HX OF   Diverticulitis   Diverticulitis of large intestine with perforation without abscess or bleeding   Complicated sigmoid diverticulitis with perforation -Continue Zosyn. -General surgery following, no plans for intervention at this time. -Keep n.p.o. for now.  History of coronary artery disease -Stable, no chest pain. -All cardiac medications including aspirin and statin are currently on hold due to n.p.o. state.  UTI -UA with many bacteria, 6-30 WBCs and positive nitrite. -Urine culture is in process. -Continue Zosyn.  Remote history of breast cancer in the 80s status post lumpectomy and radiation.   DVT prophylaxis: Lovenox Code Status: Full Code Family Communication: Patient only Disposition Plan: Pending medical stability  Consultants:   Surgery, Dr. Arnoldo Morale  Procedures:   None  Antimicrobials:  Anti-infectives (From admission, onward)   Start     Dose/Rate Route Frequency Ordered Stop   11/18/17 0600  piperacillin-tazobactam (ZOSYN) IVPB 3.375 g     3.375 g 12.5 mL/hr over 240 Minutes Intravenous Every 8 hours 11/17/17 2203     11/17/17 2215  piperacillin-tazobactam (ZOSYN) IVPB 3.375 g     3.375 g 100 mL/hr over 30 Minutes Intravenous  Once 11/17/17 2203 11/18/17 0156   11/17/17 2100  ciprofloxacin (CIPRO) IVPB 400 mg  Status:  Discontinued     400 mg 200 mL/hr over 60 Minutes Intravenous  Once 11/17/17 2056 11/17/17 2219   11/17/17 2100  metroNIDAZOLE (FLAGYL) IVPB 500 mg  Status:  Discontinued     500 mg 100 mL/hr over 60 Minutes Intravenous  Once 11/17/17 2056 11/17/17  2219       Subjective: Feels improved, much less abdominal pain. Denies n/v.  Objective: Vitals:   11/17/17 1923 11/17/17 2147 11/18/17 0330 11/18/17 0654  BP: (!) 123/58 122/63 119/64 (!) 147/42  Pulse: 85 85 82 89  Resp: 16 15 17 16   Temp:   98 F (36.7 C) 98.7 F (37.1 C)  TempSrc:   Oral Oral  SpO2: 93% 94% 99% 94%  Weight:   63.5 kg (140 lb)   Height:        Intake/Output Summary (Last 24 hours) at 11/18/2017 1359 Last data filed at 11/18/2017 0900 Gross per 24 hour  Intake 846.25 ml  Output 500 ml  Net 346.25 ml   Filed Weights   11/17/17 1203 11/18/17 0330  Weight: 63.5 kg (140 lb) 63.5 kg (140 lb)    Examination:  General exam: Alert, awake, oriented x 3 Respiratory system: Clear to auscultation. Respiratory effort normal. Cardiovascular system:RRR. No murmurs, rubs, gallops. Gastrointestinal system: Abdomen is nondistended, slightly TTP of left upper and lower quadrants. No organomegaly or masses felt. Normal bowel sounds heard. Central nervous system: Alert and oriented. No focal neurological deficits. Extremities: No C/C/E, +pedal pulses Skin: No rashes, lesions or ulcers Psychiatry: Judgement and insight appear normal. Mood & affect appropriate.     Data Reviewed: I have personally reviewed following labs and imaging studies  CBC: Recent Labs  Lab 11/17/17 1403 11/18/17 0527  WBC 11.1* 8.3  HGB 13.4 12.4  HCT 42.8 38.9  MCV 92.8 93.7  PLT  309 073   Basic Metabolic Panel: Recent Labs  Lab 11/17/17 1403 11/18/17 0527  NA 139 137  K 3.3* 3.6  CL 102 105  CO2 27 24  GLUCOSE 155* 103*  BUN 24* 13  CREATININE 1.12* 0.84  CALCIUM 9.1 8.1*   GFR: Estimated Creatinine Clearance: 50.3 mL/min (by C-G formula based on SCr of 0.84 mg/dL). Liver Function Tests: Recent Labs  Lab 11/17/17 1403 11/18/17 0527  AST 31 20  ALT 25 19  ALKPHOS 66 56  BILITOT 0.6 1.0  PROT 7.0 5.6*  ALBUMIN 3.6 2.7*   Recent Labs  Lab 11/17/17 1403    LIPASE 20   No results for input(s): AMMONIA in the last 168 hours. Coagulation Profile: No results for input(s): INR, PROTIME in the last 168 hours. Cardiac Enzymes: No results for input(s): CKTOTAL, CKMB, CKMBINDEX, TROPONINI in the last 168 hours. BNP (last 3 results) No results for input(s): PROBNP in the last 8760 hours. HbA1C: No results for input(s): HGBA1C in the last 72 hours. CBG: Recent Labs  Lab 11/17/17 1130  GLUCAP 158*   Lipid Profile: No results for input(s): CHOL, HDL, LDLCALC, TRIG, CHOLHDL, LDLDIRECT in the last 72 hours. Thyroid Function Tests: No results for input(s): TSH, T4TOTAL, FREET4, T3FREE, THYROIDAB in the last 72 hours. Anemia Panel: No results for input(s): VITAMINB12, FOLATE, FERRITIN, TIBC, IRON, RETICCTPCT in the last 72 hours. Urine analysis:    Component Value Date/Time   COLORURINE YELLOW 11/17/2017 1859   APPEARANCEUR HAZY (A) 11/17/2017 1859   LABSPEC 1.014 11/17/2017 1859   PHURINE 5.0 11/17/2017 1859   GLUCOSEU NEGATIVE 11/17/2017 1859   HGBUR NEGATIVE 11/17/2017 1859   BILIRUBINUR NEGATIVE 11/17/2017 1859   KETONESUR NEGATIVE 11/17/2017 1859   PROTEINUR NEGATIVE 11/17/2017 1859   UROBILINOGEN 0.2 07/25/2011 1454   NITRITE POSITIVE (A) 11/17/2017 1859   LEUKOCYTESUR TRACE (A) 11/17/2017 1859   Sepsis Labs: @LABRCNTIP (procalcitonin:4,lacticidven:4)  )No results found for this or any previous visit (from the past 240 hour(s)).       Radiology Studies: Ct Abdomen Pelvis W Contrast  Result Date: 11/17/2017 CLINICAL DATA:  Patient c/o left lower abd pain that started yesterday. Patient states nausea and vomiting. Patient recently discharged from ER with diagnoses of Flu. Patient has not had BM x3 days. Patient used fleet enema, Miralax 2 suppositories with no relief. Unsure of any fevers. EXAM: CT ABDOMEN AND PELVIS WITH CONTRAST TECHNIQUE: Multidetector CT imaging of the abdomen and pelvis was performed using the standard  protocol following bolus administration of intravenous contrast. CONTRAST:  80 ml ISOVUE-300 IOPAMIDOL (ISOVUE-300) INJECTION 61% COMPARISON:  08/17/2016 FINDINGS: Lower chest: No acute findings. Mild lung base atelectasis and/or scarring. Stable 4 mm nodule in the left lower lobe. Heart normal in size. Coronary artery calcifications. Hepatobiliary: No focal liver abnormality is seen. No gallstones, gallbladder wall thickening, or biliary dilatation. Pancreas: Unremarkable. No pancreatic ductal dilatation or surrounding inflammatory changes. Spleen: Normal in size without focal abnormality. Adrenals/Urinary Tract: Adrenal glands are unremarkable. Kidneys are normal, without renal calculi, focal lesion, or hydronephrosis. Bladder is unremarkable. Stomach/Bowel: There is a small amount of free intraperitoneal air. There is wall thickening and adjacent inflammation along the proximal to mid sigmoid colon where there are numerous diverticula. Extraluminal air extends from the region of the junction of the descending and sigmoid colon into the left anterior lower quadrant peritoneal cavity associated with a small amount of fluid. There are numerous additional diverticula along the left colon with no other inflammatory changes.  The right colon is mildly distended. There is no wall thickening or adjacent inflammation. Stomach and small bowel are unremarkable.  Appendix not visualized. Vascular/Lymphatic: Aortic atherosclerosis. No enlarged abdominal or pelvic lymph nodes. Reproductive: Uterus and bilateral adnexa are unremarkable. Other: There is no formed fluid collection to suggest an abscess. Most of the free intraperitoneal air collects along the anterior mid to upper abdomen and underneath the hemidiaphragms. Musculoskeletal: No fracture or acute finding. No osteoblastic or osteolytic lesions. IMPRESSION: 1. Findings are consistent with complicated diverticulitis. There are inflammatory changes along the proximal to  mid sigmoid colon where there are numerous diverticula. There is free intraperitoneal air that collects anteriorly and underneath hemidiaphragms, presumably arising from a ruptured sigmoid diverticulum. A small amount of free fluid is noted in the left lower quadrant, without a defined abscess. 2. No other acute abnormalities within the abdomen or pelvis. Electronically Signed   By: Lajean Manes M.D.   On: 11/17/2017 20:27        Scheduled Meds: . enoxaparin (LOVENOX) injection  40 mg Subcutaneous Q24H  . latanoprost  2 drop Both Eyes QHS   Continuous Infusions: . sodium chloride 75 mL/hr at 11/17/17 2220  . piperacillin-tazobactam (ZOSYN)  IV Stopped (11/18/17 0736)     LOS: 1 day    Time spent: 25 minutes.     Lelon Frohlich, MD [facility] Pager [phone]  If 7PM-7AM, please contact night-coverage [email or web address] Password [facility] 11/18/2017, 1:59 PM

## 2017-11-19 LAB — BASIC METABOLIC PANEL
Anion gap: 12 (ref 5–15)
BUN: 10 mg/dL (ref 6–20)
CALCIUM: 8.4 mg/dL — AB (ref 8.9–10.3)
CO2: 23 mmol/L (ref 22–32)
CREATININE: 0.77 mg/dL (ref 0.44–1.00)
Chloride: 103 mmol/L (ref 101–111)
GFR calc non Af Amer: >60 mL/min (ref >60)
GLUCOSE: 66 mg/dL (ref 65–99)
Potassium: 4.1 mmol/L (ref 3.5–5.1)
Sodium: 138 mmol/L (ref 135–145)

## 2017-11-19 LAB — CBC
HCT: 38.5 % (ref 36.0–46.0)
Hemoglobin: 11.9 g/dL — ABNORMAL LOW (ref 12.0–15.0)
MCH: 29.1 pg (ref 26.0–34.0)
MCHC: 30.9 g/dL (ref 30.0–36.0)
MCV: 94.1 fL (ref 78.0–100.0)
PLATELETS: 272 10*3/uL (ref 150–400)
RBC: 4.09 MIL/uL (ref 3.87–5.11)
RDW: 13.3 % (ref 11.5–15.5)
WBC: 9.9 10*3/uL (ref 4.0–10.5)

## 2017-11-19 MED ORDER — HYDROMORPHONE HCL 1 MG/ML IJ SOLN
1.0000 mg | INTRAMUSCULAR | Status: DC | PRN
  Administered 2017-11-19 – 2017-11-20 (×5): 1 mg via INTRAVENOUS
  Filled 2017-11-19 (×5): qty 1

## 2017-11-19 NOTE — Progress Notes (Signed)
PROGRESS NOTE    Dorothy Lee  ZOX:[id number] DOB: [birth year] DOA: 11/17/2017 PCP: Bretta Bang, MD     Brief Narrative:  77 year old woman admitted from home on 2/10 due to abdominal pain.  Was found on CT scan to have evidence of sigmoid diverticulitis with free air under the diaphragm.  Admission requested.   Assessment & Plan:   Active Problems:   BREAST CANCER, HX OF   Diverticulitis   Diverticulitis of large intestine with perforation without abscess or bleeding   Complicated sigmoid diverticulitis with perforation -Continue Zosyn. -General surgery following, no plans for intervention at this time. -Okay to advance diet to clear liquids.  History of coronary artery disease -Stable, no chest pain. -All cardiac medications including aspirin and statin are currently on hold due to n.p.o. state.  E. coli UTI -UA with many bacteria, 6-30 WBCs and positive nitrite. -Urine culture with greater than 100,000 colonies of E. coli, sensitivities pending. -She is on Zosyn for her complicated sigmoid diverticulitis, this should be sufficient coverage.  Remote history of breast cancer in the 80s status post lumpectomy and radiation.   DVT prophylaxis: Lovenox Code Status: Full Code Family Communication: Patient only Disposition Plan: Pending medical stability  Consultants:   Surgery, Dr. Arnoldo Morale  Procedures:   None  Antimicrobials:  Anti-infectives (From admission, onward)   Start     Dose/Rate Route Frequency Ordered Stop   11/18/17 0600  piperacillin-tazobactam (ZOSYN) IVPB 3.375 g     3.375 g 12.5 mL/hr over 240 Minutes Intravenous Every 8 hours 11/17/17 2203     11/17/17 2215  piperacillin-tazobactam (ZOSYN) IVPB 3.375 g     3.375 g 100 mL/hr over 30 Minutes Intravenous  Once 11/17/17 2203 11/18/17 0156   11/17/17 2100  ciprofloxacin (CIPRO) IVPB 400 mg  Status:  Discontinued     400 mg 200 mL/hr over 60 Minutes Intravenous  Once 11/17/17 2056 11/17/17  2219   11/17/17 2100  metroNIDAZOLE (FLAGYL) IVPB 500 mg  Status:  Discontinued     500 mg 100 mL/hr over 60 Minutes Intravenous  Once 11/17/17 2056 11/17/17 2219       Subjective: Feels improved, less abdominal pain, no vomiting, has passed some gas today, feels a little bloated.  Objective: Vitals:   11/18/17 0654 11/18/17 1300 11/18/17 2124 11/19/17 0537  BP: (!) 147/42 133/68 (!) 147/65 130/63  Pulse: 89 87 89 85  Resp: 16 18 19 15   Temp: 98.7 F (37.1 C) 98.5 F (36.9 C) 98.8 F (37.1 C) 98.4 F (36.9 C)  TempSrc: Oral Oral Oral Oral  SpO2: 94% 97% 92% 95%  Weight:      Height:        Intake/Output Summary (Last 24 hours) at 11/19/2017 1555 Last data filed at 11/19/2017 1513 Gross per 24 hour  Intake 2771.25 ml  Output 1350 ml  Net 1421.25 ml   Filed Weights   11/17/17 1203 11/18/17 0330  Weight: 63.5 kg (140 lb) 63.5 kg (140 lb)    Examination:  General exam: Alert, awake, oriented x 3 Respiratory system: Clear to auscultation. Respiratory effort normal. Cardiovascular system:RRR. No murmurs, rubs, gallops. Gastrointestinal system: Abdomen is nondistended, soft and nontender. No organomegaly or masses felt. Normal bowel sounds heard. Central nervous system: Alert and oriented. No focal neurological deficits. Extremities: No C/C/E, +pedal pulses Skin: No rashes, lesions or ulcers Psychiatry: Judgement and insight appear normal. Mood & affect appropriate.      Data Reviewed: I have personally reviewed following labs  and imaging studies  CBC: Recent Labs  Lab 11/17/17 1403 11/18/17 0527 11/19/17 0501  WBC 11.1* 8.3 9.9  HGB 13.4 12.4 11.9*  HCT 42.8 38.9 38.5  MCV 92.8 93.7 94.1  PLT 309 245 353   Basic Metabolic Panel: Recent Labs  Lab 11/17/17 1403 11/18/17 0527 11/19/17 0501  NA 139 137 138  K 3.3* 3.6 4.1  CL 102 105 103  CO2 27 24 23   GLUCOSE 155* 103* 66  BUN 24* 13 10  CREATININE 1.12* 0.84 0.77  CALCIUM 9.1 8.1* 8.4*    GFR: Estimated Creatinine Clearance: 52 mL/min (by C-G formula based on SCr of 0.77 mg/dL). Liver Function Tests: Recent Labs  Lab 11/17/17 1403 11/18/17 0527  AST 31 20  ALT 25 19  ALKPHOS 66 56  BILITOT 0.6 1.0  PROT 7.0 5.6*  ALBUMIN 3.6 2.7*   Recent Labs  Lab 11/17/17 1403  LIPASE 20   No results for input(s): AMMONIA in the last 168 hours. Coagulation Profile: No results for input(s): INR, PROTIME in the last 168 hours. Cardiac Enzymes: No results for input(s): CKTOTAL, CKMB, CKMBINDEX, TROPONINI in the last 168 hours. BNP (last 3 results) No results for input(s): PROBNP in the last 8760 hours. HbA1C: No results for input(s): HGBA1C in the last 72 hours. CBG: Recent Labs  Lab 11/17/17 1130  GLUCAP 158*   Lipid Profile: No results for input(s): CHOL, HDL, LDLCALC, TRIG, CHOLHDL, LDLDIRECT in the last 72 hours. Thyroid Function Tests: No results for input(s): TSH, T4TOTAL, FREET4, T3FREE, THYROIDAB in the last 72 hours. Anemia Panel: No results for input(s): VITAMINB12, FOLATE, FERRITIN, TIBC, IRON, RETICCTPCT in the last 72 hours. Urine analysis:    Component Value Date/Time   COLORURINE YELLOW 11/17/2017 1859   APPEARANCEUR HAZY (A) 11/17/2017 1859   LABSPEC 1.014 11/17/2017 1859   PHURINE 5.0 11/17/2017 1859   GLUCOSEU NEGATIVE 11/17/2017 1859   HGBUR NEGATIVE 11/17/2017 1859   BILIRUBINUR NEGATIVE 11/17/2017 1859   KETONESUR NEGATIVE 11/17/2017 1859   PROTEINUR NEGATIVE 11/17/2017 1859   UROBILINOGEN 0.2 07/25/2011 1454   NITRITE POSITIVE (A) 11/17/2017 1859   LEUKOCYTESUR TRACE (A) 11/17/2017 1859   Sepsis Labs: @LABRCNTIP (procalcitonin:4,lacticidven:4)  ) Recent Results (from the past 240 hour(s))  Urine Culture     Status: Abnormal (Preliminary result)   Collection Time: 11/17/17  7:13 PM  Result Value Ref Range Status   Specimen Description   Final    URINE, CLEAN CATCH Performed at [facility], [address], [place], [place]  [address]    Special Requests   Final    NONE Performed at [facility], [address], [place], [place] [address]    Culture (A)  Final    >=100,000 COLONIES/mL ESCHERICHIA COLI SUSCEPTIBILITIES TO FOLLOW Performed at [facility], [place] [address] [place], [place] [address]    Report Status PENDING  Incomplete         Radiology Studies: Ct Abdomen Pelvis W Contrast  Result Date: 11/17/2017 CLINICAL DATA:  Patient c/o left lower abd pain that started yesterday. Patient states nausea and vomiting. Patient recently discharged from ER with diagnoses of Flu. Patient has not had BM x3 days. Patient used fleet enema, Miralax 2 suppositories with no relief. Unsure of any fevers. EXAM: CT ABDOMEN AND PELVIS WITH CONTRAST TECHNIQUE: Multidetector CT imaging of the abdomen and pelvis was performed using the standard protocol following bolus administration of intravenous contrast. CONTRAST:  80 ml ISOVUE-300 IOPAMIDOL (ISOVUE-300) INJECTION 61% COMPARISON:  08/17/2016 FINDINGS: Lower chest: No acute findings. Mild lung base atelectasis and/or scarring. Stable 4 mm nodule in the left lower lobe. Heart normal in size. Coronary artery calcifications. Hepatobiliary: No focal liver abnormality is seen. No gallstones, gallbladder wall thickening, or biliary dilatation. Pancreas: Unremarkable. No pancreatic ductal dilatation or surrounding inflammatory changes. Spleen: Normal in size without focal abnormality. Adrenals/Urinary Tract: Adrenal glands are unremarkable. Kidneys are normal, without renal calculi, focal lesion, or hydronephrosis. Bladder is unremarkable. Stomach/Bowel: There is a small amount of free intraperitoneal air. There is wall thickening and adjacent inflammation along the proximal to mid sigmoid colon where there are numerous diverticula. Extraluminal air extends from the region of the junction of the descending and sigmoid colon into the left anterior lower quadrant peritoneal cavity  associated with a small amount of fluid. There are numerous additional diverticula along the left colon with no other inflammatory changes. The right colon is mildly distended. There is no wall thickening or adjacent inflammation. Stomach and small bowel are unremarkable.  Appendix not visualized. Vascular/Lymphatic: Aortic atherosclerosis. No enlarged abdominal or pelvic lymph nodes. Reproductive: Uterus and bilateral adnexa are unremarkable. Other: There is no formed fluid collection to suggest an abscess. Most of the free intraperitoneal air collects along the anterior mid to upper abdomen and underneath the hemidiaphragms. Musculoskeletal: No fracture or acute finding. No osteoblastic or osteolytic lesions. IMPRESSION: 1. Findings are consistent with complicated diverticulitis. There are inflammatory changes along the proximal to mid sigmoid colon where there are numerous diverticula. There is free intraperitoneal air that collects anteriorly and underneath hemidiaphragms, presumably arising from a ruptured sigmoid diverticulum. A small amount of free fluid is noted in the left lower quadrant, without a defined abscess. 2. No other acute abnormalities within the abdomen or pelvis. Electronically Signed   By: Lajean Manes M.D.   On: 11/17/2017 20:27        Scheduled Meds: . enoxaparin (LOVENOX) injection  40 mg Subcutaneous Q24H  . latanoprost  2 drop Both Eyes QHS   Continuous Infusions: . sodium chloride 75 mL/hr at 11/17/17 2220  . piperacillin-tazobactam (ZOSYN)  IV 3.375 g (11/19/17 1337)     LOS: 2 days    Time spent: 25 minutes.     Lelon Frohlich, MD [facility] Pager [phone]  If 7PM-7AM, please contact night-coverage [email or web address] Password [facility] 11/19/2017, 3:55 PM

## 2017-11-19 NOTE — Care Management Important Message (Signed)
Important Message  Patient Details  Name: CALE DECAROLIS MRN: [id number] Date of Birth: 1940/10/11   Medicare Important Message Given:  Yes    Zannie Runkle, Chauncey Reading, RN 11/19/2017, 3:03 PM

## 2017-11-19 NOTE — Progress Notes (Signed)
Subjective: Patient states her abdominal pain is less.  She does feel a little bloated.  No nausea or vomiting have been noted.  No flatus or bowel movement yet.  Objective: Vital signs in last 24 hours: Temp:  [98.4 F (36.9 C)-98.8 F (37.1 C)] 98.4 F (36.9 C) (02/12 0537) Pulse Rate:  [85-89] 85 (02/12 0537) Resp:  [15-19] 15 (02/12 0537) BP: (130-147)/(63-68) 130/63 (02/12 0537) SpO2:  [92 %-97 %] 95 % (02/12 0537) Last BM Date: 11/13/17  Intake/Output from previous day: 02/11 0701 - 02/12 0700 In: 930 [I.V.:830; IV Piggyback:100] Out: 2250 [Urine:2250] Intake/Output this shift: No intake/output data recorded.  General appearance: alert, cooperative and no distress GI: Soft, minimal tenderness noted in the left lower quadrant to deep palpation.  No rigidity noted.  Slight abdominal distention present but not tense.  Lab Results:  Recent Labs    11/18/17 0527 11/19/17 0501  WBC 8.3 9.9  HGB 12.4 11.9*  HCT 38.9 38.5  PLT 245 272   BMET Recent Labs    11/18/17 0527 11/19/17 0501  NA 137 138  K 3.6 4.1  CL 105 103  CO2 24 23  GLUCOSE 103* 66  BUN 13 10  CREATININE 0.84 0.77  CALCIUM 8.1* 8.4*   PT/INR No results for input(s): LABPROT, INR in the last 72 hours.  Studies/Results: Ct Abdomen Pelvis W Contrast  Result Date: 11/17/2017 CLINICAL DATA:  Patient c/o left lower abd pain that started yesterday. Patient states nausea and vomiting. Patient recently discharged from ER with diagnoses of Flu. Patient has not had BM x3 days. Patient used fleet enema, Miralax 2 suppositories with no relief. Unsure of any fevers. EXAM: CT ABDOMEN AND PELVIS WITH CONTRAST TECHNIQUE: Multidetector CT imaging of the abdomen and pelvis was performed using the standard protocol following bolus administration of intravenous contrast. CONTRAST:  80 ml ISOVUE-300 IOPAMIDOL (ISOVUE-300) INJECTION 61% COMPARISON:  08/17/2016 FINDINGS: Lower chest: No acute findings. Mild lung base  atelectasis and/or scarring. Stable 4 mm nodule in the left lower lobe. Heart normal in size. Coronary artery calcifications. Hepatobiliary: No focal liver abnormality is seen. No gallstones, gallbladder wall thickening, or biliary dilatation. Pancreas: Unremarkable. No pancreatic ductal dilatation or surrounding inflammatory changes. Spleen: Normal in size without focal abnormality. Adrenals/Urinary Tract: Adrenal glands are unremarkable. Kidneys are normal, without renal calculi, focal lesion, or hydronephrosis. Bladder is unremarkable. Stomach/Bowel: There is a small amount of free intraperitoneal air. There is wall thickening and adjacent inflammation along the proximal to mid sigmoid colon where there are numerous diverticula. Extraluminal air extends from the region of the junction of the descending and sigmoid colon into the left anterior lower quadrant peritoneal cavity associated with a small amount of fluid. There are numerous additional diverticula along the left colon with no other inflammatory changes. The right colon is mildly distended. There is no wall thickening or adjacent inflammation. Stomach and small bowel are unremarkable.  Appendix not visualized. Vascular/Lymphatic: Aortic atherosclerosis. No enlarged abdominal or pelvic lymph nodes. Reproductive: Uterus and bilateral adnexa are unremarkable. Other: There is no formed fluid collection to suggest an abscess. Most of the free intraperitoneal air collects along the anterior mid to upper abdomen and underneath the hemidiaphragms. Musculoskeletal: No fracture or acute finding. No osteoblastic or osteolytic lesions. IMPRESSION: 1. Findings are consistent with complicated diverticulitis. There are inflammatory changes along the proximal to mid sigmoid colon where there are numerous diverticula. There is free intraperitoneal air that collects anteriorly and underneath hemidiaphragms, presumably arising from a  ruptured sigmoid diverticulum. A small  amount of free fluid is noted in the left lower quadrant, without a defined abscess. 2. No other acute abnormalities within the abdomen or pelvis. Electronically Signed   By: Lajean Manes M.D.   On: 11/17/2017 20:27    Anti-infectives: Anti-infectives (From admission, onward)   Start     Dose/Rate Route Frequency Ordered Stop   11/18/17 0600  piperacillin-tazobactam (ZOSYN) IVPB 3.375 g     3.375 g 12.5 mL/hr over 240 Minutes Intravenous Every 8 hours 11/17/17 2203     11/17/17 2215  piperacillin-tazobactam (ZOSYN) IVPB 3.375 g     3.375 g 100 mL/hr over 30 Minutes Intravenous  Once 11/17/17 2203 11/18/17 0156   11/17/17 2100  ciprofloxacin (CIPRO) IVPB 400 mg  Status:  Discontinued     400 mg 200 mL/hr over 60 Minutes Intravenous  Once 11/17/17 2056 11/17/17 2219   11/17/17 2100  metroNIDAZOLE (FLAGYL) IVPB 500 mg  Status:  Discontinued     500 mg 100 mL/hr over 60 Minutes Intravenous  Once 11/17/17 2056 11/17/17 2219      Assessment/Plan: Impression: Sigmoid diverticulitis with perforation, slowly resolving.  No need for acute surgical intervention at this time. Plan: Will advance to clear liquid diet.  Hopefully her bowel function returned in the next 24-48 hours.  LOS: 2 days    Aviva Signs 11/19/2017

## 2017-11-19 NOTE — Progress Notes (Signed)
Dr Darrick Meigs paged to see if Dilaudid order could be changed back to IV from IM since pt now has IV access.

## 2017-11-20 DIAGNOSIS — K5720 Diverticulitis of large intestine with perforation and abscess without bleeding: Principal | ICD-10-CM

## 2017-11-20 LAB — URINE CULTURE

## 2017-11-20 MED ORDER — METRONIDAZOLE 500 MG PO TABS: 500.0000 mg | ORAL_TABLET | Freq: Three times a day (TID) | ORAL | 0 refills | Status: AC

## 2017-11-20 MED ORDER — CIPROFLOXACIN HCL 500 MG PO TABS: 500.0000 mg | ORAL_TABLET | Freq: Two times a day (BID) | ORAL | 0 refills | Status: AC

## 2017-11-20 NOTE — Progress Notes (Signed)
Patient discharged with instructions given on medications,and follow up visits,patient verbalized understanding. Prescriptions sent to Pharmacy of choice documented on AVS. IV discontinued,catheter intact. Accompanied by staff to an awaiting vehicle.

## 2017-11-20 NOTE — Discharge Summary (Signed)
Physician Discharge Summary  Dorothy Lee [facility] VZD:[id number] DOB: November 08, 1940 DOA: 11/17/2017  PCP: Bretta Bang, MD  Admit date: 11/17/2017  Discharge date: 11/20/2017  Admitted From:Home  Disposition:  Home  Recommendations for Outpatient Follow-up:  1. Follow up with PCP in 2 weeks and Dr. Arnoldo Morale of [place] in 2 weeks as scheduled  Home Health:N/A  Equipment/Devices:None  Discharge Condition:Stable  CODE STATUS: Full  Diet recommendation: Heart Healthy  Brief/Interim Summary: This is a 77 year old female who presented on 2/10 with complaints of abdominal pain and was noted to have a complicated sigmoid diverticulitis with perforation with free air under the diaphragm.  She was placed on IV Zosyn for empiric treatment and was seen by general surgery with no plans for operative intervention noted at this time.  She clinically improved and was noted to have bowel movements while here and has started to tolerate a diet.  She will be discharged with oral ciprofloxacin as well as Flagyl for 10-day course of treatment and will follow up with general surgery Dr. Arnoldo Morale in the next 2 weeks.  No acute events have been noted during the course of this admission.   Discharge Diagnoses:  Active Problems:   BREAST CANCER, HX OF   Diverticulitis   Diverticulitis of large intestine with perforation without abscess or bleeding  1. Complicated sigmoid diverticulitis with perforation.  Clinically stable for discharge and will follow up with Dr. Arnoldo Morale of general surgery in 2 weeks.  Continue on oral ciprofloxacin as well as Flagyl for 2 weeks as prescribed. 2. E. coli UTI.  Continue antibiotics as mentioned above.  Sensitivities have been reviewed and bacteria is sensitive to ciprofloxacin.  She is currently asymptomatic. 3. History of coronary artery disease.  Continue home medications as previously prescribed.  Discharge Instructions  Discharge Instructions    Call MD for:  persistant nausea and  vomiting   Complete by:  As directed    Call MD for:  severe uncontrolled pain   Complete by:  As directed    Call MD for:  temperature >100.4   Complete by:  As directed    Diet - low sodium heart healthy   Complete by:  As directed    Increase activity slowly   Complete by:  As directed      Allergies as of 11/20/2017      Reactions   Crestor [rosuvastatin]    Crestor 20 mg caused muscle aches - pt is able to tolerate lower doses (takes 10mg )   Lipitor [atorvastatin]    Atorvastatin 40 mg caused fatigue   Lisinopril Cough   Simvastatin    Muscle aches      Medication List    TAKE these medications   aspirin EC 81 MG tablet Take 81 mg by mouth every morning.   chlorpheniramine 4 MG tablet Commonly known as:  CHLOR-TRIMETON Take 1 tablet (4 mg total) by mouth 3 (three) times daily with meals.   ciprofloxacin 500 MG tablet Commonly known as:  CIPRO Take 1 tablet (500 mg total) by mouth 2 (two) times daily for 10 days.   dexamethasone 4 MG tablet Commonly known as:  DECADRON Take 1 tablet (4 mg total) by mouth 2 (two) times daily.   HYDROcodone-homatropine 5-1.5 MG/5ML syrup Commonly known as:  HYCODAN Take 5 mLs by mouth every 6 (six) hours as needed.   isosorbide mononitrate 30 MG 24 hr tablet Commonly known as:  IMDUR Take 1 tablet (30 mg total) by mouth daily.  latanoprost 0.005 % ophthalmic solution Commonly known as:  XALATAN Place 2 drops into both eyes at bedtime.   metroNIDAZOLE 500 MG tablet Commonly known as:  FLAGYL Take 1 tablet (500 mg total) by mouth 3 (three) times daily for 10 days.   nitroGLYCERIN 0.4 MG SL tablet Commonly known as:  NITROSTAT Place 0.4 mg under the tongue every 5 (five) minutes x 3 doses as needed for chest pain. May repeat x3   pantoprazole 40 MG tablet Commonly known as:  PROTONIX Take 40 mg by mouth daily.   rosuvastatin 10 MG tablet Commonly known as:  CRESTOR Take 1 tablet (10 mg total) by mouth daily. OVERDUE  FOR FOLLOW UP. CALL AND SCHEDULE [phone]      Follow-up Information    Aviva Signs, MD On 12/05/2017.   Specialty:  General Surgery Why:  at 9:45 am Contact information: [id number] [place] [address] [phone]        Bretta Bang, MD On 12/04/2017.   Specialty:  Family Medicine Why:  at 2:00 pm Contact information: [place] [address] [phone]          Allergies  Allergen Reactions  . Crestor [Rosuvastatin]     Crestor 20 mg caused muscle aches - pt is able to tolerate lower doses (takes 10mg )  . Lipitor [Atorvastatin]     Atorvastatin 40 mg caused fatigue  . Lisinopril Cough  . Simvastatin     Muscle aches    Consultations:  Dr. Arnoldo Morale GS   Procedures/Studies: Dg Chest 2 View  Result Date: 11/14/2017 CLINICAL DATA:  Cough, chills, vomiting, and weakness. EXAM: CHEST  2 VIEW COMPARISON:  Chest x-ray dated October 30, 2016. FINDINGS: The heart size and mediastinal contours are within normal limits. Multiple coronary stents again noted. Normal pulmonary vascularity. Slightly more prominent interstitial markings when compared to prior study. No focal consolidation, pleural effusion, or pneumothorax. Unchanged eventration of the right hemidiaphragm. No acute osseous abnormality. IMPRESSION: New bronchitic changes.  No consolidation Electronically Signed   By: Titus Dubin M.D.   On: 11/14/2017 11:05   Ct Abdomen Pelvis W Contrast  Result Date: 11/17/2017 CLINICAL DATA:  Patient c/o left lower abd pain that started yesterday. Patient states nausea and vomiting. Patient recently discharged from ER with diagnoses of Flu. Patient has not had BM x3 days. Patient used fleet enema, Miralax 2 suppositories with no relief. Unsure of any fevers. EXAM: CT ABDOMEN AND PELVIS WITH CONTRAST TECHNIQUE: Multidetector CT imaging of the abdomen and pelvis was performed using the standard protocol following bolus administration of  intravenous contrast. CONTRAST:  80 ml ISOVUE-300 IOPAMIDOL (ISOVUE-300) INJECTION 61% COMPARISON:  08/17/2016 FINDINGS: Lower chest: No acute findings. Mild lung base atelectasis and/or scarring. Stable 4 mm nodule in the left lower lobe. Heart normal in size. Coronary artery calcifications. Hepatobiliary: No focal liver abnormality is seen. No gallstones, gallbladder wall thickening, or biliary dilatation. Pancreas: Unremarkable. No pancreatic ductal dilatation or surrounding inflammatory changes. Spleen: Normal in size without focal abnormality. Adrenals/Urinary Tract: Adrenal glands are unremarkable. Kidneys are normal, without renal calculi, focal lesion, or hydronephrosis. Bladder is unremarkable. Stomach/Bowel: There is a small amount of free intraperitoneal air. There is wall thickening and adjacent inflammation along the proximal to mid sigmoid colon where there are numerous diverticula. Extraluminal air extends from the region of the junction of the descending and sigmoid colon into the left anterior lower quadrant peritoneal cavity associated with a small amount of fluid. There are numerous  additional diverticula along the left colon with no other inflammatory changes. The right colon is mildly distended. There is no wall thickening or adjacent inflammation. Stomach and small bowel are unremarkable.  Appendix not visualized. Vascular/Lymphatic: Aortic atherosclerosis. No enlarged abdominal or pelvic lymph nodes. Reproductive: Uterus and bilateral adnexa are unremarkable. Other: There is no formed fluid collection to suggest an abscess. Most of the free intraperitoneal air collects along the anterior mid to upper abdomen and underneath the hemidiaphragms. Musculoskeletal: No fracture or acute finding. No osteoblastic or osteolytic lesions. IMPRESSION: 1. Findings are consistent with complicated diverticulitis. There are inflammatory changes along the proximal to mid sigmoid colon where there are numerous  diverticula. There is free intraperitoneal air that collects anteriorly and underneath hemidiaphragms, presumably arising from a ruptured sigmoid diverticulum. A small amount of free fluid is noted in the left lower quadrant, without a defined abscess. 2. No other acute abnormalities within the abdomen or pelvis. Electronically Signed   By: Lajean Manes M.D.   On: 11/17/2017 20:27   Nm Myocar Multi W/spect W/wall Motion / Ef  Result Date: 11/05/2017  No diagnostic ST segment changes to indicate ischemia.  Small, mild intensity, mid to apical septal defect that is fixed, more prominent at rest and stress. Suggestive of soft tissue attenuation rather than scar in light of normal wall motion.  This is a low risk study.  Nuclear stress EF: 72%.      Discharge Exam: Vitals:   11/19/17 2220 11/20/17 0630  BP: 132/61 122/69  Pulse: 80 78  Resp: 16 16  Temp: 98.4 F (36.9 C) 98.4 F (36.9 C)  SpO2: 95% 95%   Vitals:   11/19/17 0537 11/19/17 1700 11/19/17 2220 11/20/17 0630  BP: 130/63 140/65 132/61 122/69  Pulse: 85 85 80 78  Resp: 15 16 16 16   Temp: 98.4 F (36.9 C) 98.5 F (36.9 C) 98.4 F (36.9 C) 98.4 F (36.9 C)  TempSrc: Oral Oral Oral Oral  SpO2: 95% 96% 95% 95%  Weight:      Height:        General: Pt is alert, awake, not in acute distress Cardiovascular: RRR, S1/S2 +, no rubs, no gallops Respiratory: CTA bilaterally, no wheezing, no rhonchi Abdominal: Soft, minimally tender to palpation, ND, bowel sounds + Extremities: no edema, no cyanosis    The results of significant diagnostics from this hospitalization (including imaging, microbiology, ancillary and laboratory) are listed below for reference.     Microbiology: Recent Results (from the past 240 hour(s))  Urine Culture     Status: Abnormal   Collection Time: 11/17/17  7:13 PM  Result Value Ref Range Status   Specimen Description   Final    URINE, CLEAN CATCH Performed at [facility], [address]  [place], [place] [address]    Special Requests   Final    NONE Performed at [facility], [address], [place], [place] [address]    Culture >=100,000 COLONIES/mL ESCHERICHIA COLI (A)  Final   Report Status 11/20/2017 FINAL  Final   Organism ID, Bacteria ESCHERICHIA COLI (A)  Final      Susceptibility   Escherichia coli - MIC*    AMPICILLIN <=2 SENSITIVE Sensitive     CEFAZOLIN <=4 SENSITIVE Sensitive     CEFTRIAXONE <=1 SENSITIVE Sensitive     CIPROFLOXACIN <=0.25 SENSITIVE Sensitive     GENTAMICIN <=1 SENSITIVE Sensitive     IMIPENEM <=0.25 SENSITIVE Sensitive     NITROFURANTOIN <=16 SENSITIVE Sensitive  TRIMETH/SULFA <=20 SENSITIVE Sensitive     AMPICILLIN/SULBACTAM <=2 SENSITIVE Sensitive     PIP/TAZO <=4 SENSITIVE Sensitive     Extended ESBL NEGATIVE Sensitive     * >=100,000 COLONIES/mL ESCHERICHIA COLI     Labs: BNP (last 3 results) No results for input(s): BNP in the last 8760 hours. Basic Metabolic Panel: Recent Labs  Lab 11/17/17 1403 11/18/17 0527 11/19/17 0501  NA 139 137 138  K 3.3* 3.6 4.1  CL 102 105 103  CO2 27 24 23   GLUCOSE 155* 103* 66  BUN 24* 13 10  CREATININE 1.12* 0.84 0.77  CALCIUM 9.1 8.1* 8.4*   Liver Function Tests: Recent Labs  Lab 11/17/17 1403 11/18/17 0527  AST 31 20  ALT 25 19  ALKPHOS 66 56  BILITOT 0.6 1.0  PROT 7.0 5.6*  ALBUMIN 3.6 2.7*   Recent Labs  Lab 11/17/17 1403  LIPASE 20   No results for input(s): AMMONIA in the last 168 hours. CBC: Recent Labs  Lab 11/17/17 1403 11/18/17 0527 11/19/17 0501  WBC 11.1* 8.3 9.9  HGB 13.4 12.4 11.9*  HCT 42.8 38.9 38.5  MCV 92.8 93.7 94.1  PLT 309 245 272   Cardiac Enzymes: No results for input(s): CKTOTAL, CKMB, CKMBINDEX, TROPONINI in the last 168 hours. BNP: Invalid input(s): POCBNP CBG: Recent Labs  Lab 11/17/17 1130  GLUCAP 158*   D-Dimer No results for input(s): DDIMER in the last 72 hours. Hgb A1c No results for input(s): HGBA1C in the last 72  hours. Lipid Profile No results for input(s): CHOL, HDL, LDLCALC, TRIG, CHOLHDL, LDLDIRECT in the last 72 hours. Thyroid function studies No results for input(s): TSH, T4TOTAL, T3FREE, THYROIDAB in the last 72 hours.  Invalid input(s): FREET3 Anemia work up No results for input(s): VITAMINB12, FOLATE, FERRITIN, TIBC, IRON, RETICCTPCT in the last 72 hours. Urinalysis    Component Value Date/Time   COLORURINE YELLOW 11/17/2017 1859   APPEARANCEUR HAZY (A) 11/17/2017 1859   LABSPEC 1.014 11/17/2017 1859   PHURINE 5.0 11/17/2017 1859   GLUCOSEU NEGATIVE 11/17/2017 1859   HGBUR NEGATIVE 11/17/2017 1859   BILIRUBINUR NEGATIVE 11/17/2017 1859   KETONESUR NEGATIVE 11/17/2017 1859   PROTEINUR NEGATIVE 11/17/2017 1859   UROBILINOGEN 0.2 07/25/2011 1454   NITRITE POSITIVE (A) 11/17/2017 1859   LEUKOCYTESUR TRACE (A) 11/17/2017 1859   Sepsis Labs Invalid input(s): PROCALCITONIN,  WBC,  LACTICIDVEN Microbiology Recent Results (from the past 240 hour(s))  Urine Culture     Status: Abnormal   Collection Time: 11/17/17  7:13 PM  Result Value Ref Range Status   Specimen Description   Final    URINE, CLEAN CATCH Performed at [facility], [address], [place], [place] [address]    Special Requests   Final    NONE Performed at [facility], [address] [place], [place] [address]    Culture >=100,000 COLONIES/mL ESCHERICHIA COLI (A)  Final   Report Status 11/20/2017 FINAL  Final   Organism ID, Bacteria ESCHERICHIA COLI (A)  Final      Susceptibility   Escherichia coli - MIC*    AMPICILLIN <=2 SENSITIVE Sensitive     CEFAZOLIN <=4 SENSITIVE Sensitive     CEFTRIAXONE <=1 SENSITIVE Sensitive     CIPROFLOXACIN <=0.25 SENSITIVE Sensitive     GENTAMICIN <=1 SENSITIVE Sensitive     IMIPENEM <=0.25 SENSITIVE Sensitive     NITROFURANTOIN <=16 SENSITIVE Sensitive     TRIMETH/SULFA <=20 SENSITIVE Sensitive     AMPICILLIN/SULBACTAM <=2 SENSITIVE Sensitive  PIP/TAZO <=4 SENSITIVE  Sensitive     Extended ESBL NEGATIVE Sensitive     * >=100,000 COLONIES/mL ESCHERICHIA COLI     Time coordinating discharge: Over 30 minutes  SIGNED:   Rodena Goldmann, DO [facility] 11/20/2017, 12:14 PM Pager [phone]  If 7PM-7AM, please contact night-coverage [email or web address] Password [email or web address]

## 2017-11-20 NOTE — Progress Notes (Signed)
  Subjective: Feels much better.  Minimal left lower quadrant abdominal pain noted.  Objective: Vital signs in last 24 hours: Temp:  [98.4 F (36.9 C)-98.5 F (36.9 C)] 98.4 F (36.9 C) (02/13 0630) Pulse Rate:  [78-85] 78 (02/13 0630) Resp:  [16] 16 (02/13 0630) BP: (122-140)/(61-69) 122/69 (02/13 0630) SpO2:  [95 %-96 %] 95 % (02/13 0630) Last BM Date: 11/20/17  Intake/Output from previous day: 02/12 0701 - 02/13 0700 In: 3231.3 [P.O.:360; I.V.:2721.3; IV Piggyback:150] Out: 1350 [Urine:1350] Intake/Output this shift: No intake/output data recorded.  General appearance: alert, cooperative and no distress GI: Soft, minimal tenderness to palpation in the left lower quadrant.  No rigidity is noted.  Bowel sounds are active.  Lab Results:  Recent Labs    11/18/17 0527 11/19/17 0501  WBC 8.3 9.9  HGB 12.4 11.9*  HCT 38.9 38.5  PLT 245 272   BMET Recent Labs    11/18/17 0527 11/19/17 0501  NA 137 138  K 3.6 4.1  CL 105 103  CO2 24 23  GLUCOSE 103* 66  BUN 13 10  CREATININE 0.84 0.77  CALCIUM 8.1* 8.4*   PT/INR No results for input(s): LABPROT, INR in the last 72 hours.  Studies/Results: No results found.  Anti-infectives: Anti-infectives (From admission, onward)   Start     Dose/Rate Route Frequency Ordered Stop   11/18/17 0600  piperacillin-tazobactam (ZOSYN) IVPB 3.375 g     3.375 g 12.5 mL/hr over 240 Minutes Intravenous Every 8 hours 11/17/17 2203     11/17/17 2215  piperacillin-tazobactam (ZOSYN) IVPB 3.375 g     3.375 g 100 mL/hr over 30 Minutes Intravenous  Once 11/17/17 2203 11/18/17 0156   11/17/17 2100  ciprofloxacin (CIPRO) IVPB 400 mg  Status:  Discontinued     400 mg 200 mL/hr over 60 Minutes Intravenous  Once 11/17/17 2056 11/17/17 2219   11/17/17 2100  metroNIDAZOLE (FLAGYL) IVPB 500 mg  Status:  Discontinued     500 mg 100 mL/hr over 60 Minutes Intravenous  Once 11/17/17 2056 11/17/17 2219      Assessment/Plan: Impression:  Perforated sigmoid diverticulitis, resolving Plan: Okay to advance to soft diet.  Okay for discharge from surgery standpoint.  Would place on 10-day course of ciprofloxacin and Flagyl.  I will see the patient in my office in 2 weeks.  LOS: 3 days    Aviva Signs 11/20/2017

## 2017-11-21 ENCOUNTER — Ambulatory Visit: Payer: Medicare Other | Admitting: Internal Medicine

## 2017-11-26 ENCOUNTER — Ambulatory Visit (INDEPENDENT_AMBULATORY_CARE_PROVIDER_SITE_OTHER): Payer: Medicare Other | Admitting: General Surgery

## 2017-11-26 ENCOUNTER — Encounter: Payer: Self-pay | Admitting: General Surgery

## 2017-11-26 VITALS — BP 163/76 | HR 92 | Temp 98.6°F | Ht 63.0 in | Wt 139.0 lb

## 2017-11-26 DIAGNOSIS — I25119 Atherosclerotic heart disease of native coronary artery with unspecified angina pectoris: Secondary | ICD-10-CM | POA: Diagnosis not present

## 2017-11-26 DIAGNOSIS — K5792 Diverticulitis of intestine, part unspecified, without perforation or abscess without bleeding: Secondary | ICD-10-CM | POA: Diagnosis not present

## 2017-11-26 NOTE — Patient Instructions (Signed)
Diverticulitis °Diverticulitis is infection or inflammation of small pouches (diverticula) in the colon that form due to a condition called diverticulosis. Diverticula can trap stool (feces) and bacteria, causing infection and inflammation. °Diverticulitis may cause severe stomach pain and diarrhea. It may lead to tissue damage in the colon that causes bleeding. The diverticula may also burst (rupture) and cause infected stool to enter other areas of the abdomen. °Complications of diverticulitis can include: °· Bleeding. °· Severe infection. °· Severe pain. °· Rupture (perforation) of the colon. °· Blockage (obstruction) of the colon. ° °What are the causes? °This condition is caused by stool becoming trapped in the diverticula, which allows bacteria to grow in the diverticula. This leads to inflammation and infection. °What increases the risk? °You are more likely to develop this condition if: °· You have diverticulosis. The risk for diverticulosis increases if: °? You are overweight or obese. °? You use tobacco products. °? You do not get enough exercise. °· You eat a diet that does not include enough fiber. High-fiber foods include fruits, vegetables, beans, nuts, and whole grains. ° °What are the signs or symptoms? °Symptoms of this condition may include: °· Pain and tenderness in the abdomen. The pain is normally located on the left side of the abdomen, but it may occur in other areas. °· Fever and chills. °· Bloating. °· Cramping. °· Nausea. °· Vomiting. °· Changes in bowel routines. °· Blood in your stool. ° °How is this diagnosed? °This condition is diagnosed based on: °· Your medical history. °· A physical exam. °· Tests to make sure there is nothing else causing your condition. These tests may include: °? Blood tests. °? Urine tests. °? Imaging tests of the abdomen, including X-rays, ultrasounds, MRIs, or CT scans. ° °How is this treated? °Most cases of this condition are mild and can be treated at home.  Treatment may include: °· Taking over-the-counter pain medicines. °· Following a clear liquid diet. °· Taking antibiotic medicines by mouth. °· Rest. ° °More severe cases may need to be treated at a hospital. Treatment may include: °· Not eating or drinking. °· Taking prescription pain medicine. °· Receiving antibiotic medicines through an IV tube. °· Receiving fluids and nutrition through an IV tube. °· Surgery. ° °When your condition is under control, your health care provider may recommend that you have a colonoscopy. This is an exam to look at the entire large intestine. During the exam, a lubricated, bendable tube is inserted into the anus and then passed into the rectum, colon, and other parts of the large intestine. A colonoscopy can show how severe your diverticula are and whether something else may be causing your symptoms. °Follow these instructions at home: °Medicines °· Take over-the-counter and prescription medicines only as told by your health care provider. These include fiber supplements, probiotics, and stool softeners. °· If you were prescribed an antibiotic medicine, take it as told by your health care provider. Do not stop taking the antibiotic even if you start to feel better. °· Do not drive or use heavy machinery while taking prescription pain medicine. °General instructions °· Follow a full liquid diet or another diet as directed by your health care provider. After your symptoms improve, your health care provider may tell you to change your diet. He or she may recommend that you eat a diet that contains at least 25 g (25 grams) of fiber daily. Fiber makes it easier to pass stool. Healthy sources of fiber include: °? Berries. One cup   contains 4-8 grams of fiber. °? Beans or lentils. One half cup contains 5-8 grams of fiber. °? Green vegetables. One cup contains 4 grams of fiber. °· Exercise for at least 30 minutes, 3 times each week. You should exercise hard enough to raise your heart rate and  break a sweat. °· Keep all follow-up visits as told by your health care provider. This is important. You may need a colonoscopy. °Contact a health care provider if: °· Your pain does not improve. °· You have a hard time drinking or eating food. °· Your bowel movements do not return to normal. °Get help right away if: °· Your pain gets worse. °· Your symptoms do not get better with treatment. °· Your symptoms suddenly get worse. °· You have a fever. °· You vomit more than one time. °· You have stools that are bloody, black, or tarry. °Summary °· Diverticulitis is infection or inflammation of small pouches (diverticula) in the colon that form due to a condition called diverticulosis. Diverticula can trap stool (feces) and bacteria, causing infection and inflammation. °· You are at higher risk for this condition if you have diverticulosis and you eat a diet that does not include enough fiber. °· Most cases of this condition are mild and can be treated at home. More severe cases may need to be treated at a hospital. °· When your condition is under control, your health care provider may recommend that you have an exam called a colonoscopy. This exam can show how severe your diverticula are and whether something else may be causing your symptoms. °This information is not intended to replace advice given to you by your health care provider. Make sure you discuss any questions you have with your health care provider. °Document Released: 07/04/2005 Document Revised: 10/27/2016 Document Reviewed: 10/27/2016 °Elsevier Interactive Patient Education © 2018 [employer] ° °

## 2017-11-26 NOTE — Progress Notes (Signed)
Subjective:     Dorothy Lee  Here for follow-up after admission for diverticulitis with perforation.  Patient states she feels better.  Minimal left lower quadrant abdominal pain noted.  It comes and goes.  She denies any fever or chills.  Her bowel movements have been normal.  She is taking her ciprofloxacin and Flagyl as prescribed. Objective:    BP (!) 163/76   Pulse 92   Temp 98.6 F (37 C)   Ht 5\' 3"  (1.6 m)   Wt 139 lb (63 kg)   BMI 24.62 kg/m   General:  alert, cooperative and no distress  Abdomen soft, nontender, nondistended.  No rigidity noted.  Bowel sounds active.     Assessment:    Sigmoid colon diverticulitis with perforation, resolving medically.    Plan:  I did tell the patient that she will need a partial colectomy due to the significant episode of perforation and the fact that this is recurrent in nature.  She understands this.  She will follow-up here in 1 month to schedule partial colectomy.  She is to finish her antibiotic course.

## 2017-12-05 ENCOUNTER — Ambulatory Visit: Payer: Medicare Other | Admitting: General Surgery

## 2017-12-09 ENCOUNTER — Encounter: Payer: Self-pay | Admitting: Family Medicine

## 2017-12-09 ENCOUNTER — Other Ambulatory Visit: Payer: Self-pay

## 2017-12-09 ENCOUNTER — Ambulatory Visit (INDEPENDENT_AMBULATORY_CARE_PROVIDER_SITE_OTHER): Payer: Medicare Other | Admitting: Family Medicine

## 2017-12-09 VITALS — BP 144/78 | HR 92 | Temp 97.9°F | Resp 16 | Ht 63.0 in | Wt 139.2 lb

## 2017-12-09 DIAGNOSIS — Z79899 Other long term (current) drug therapy: Secondary | ICD-10-CM | POA: Diagnosis not present

## 2017-12-09 DIAGNOSIS — I25118 Atherosclerotic heart disease of native coronary artery with other forms of angina pectoris: Secondary | ICD-10-CM | POA: Diagnosis not present

## 2017-12-09 DIAGNOSIS — Z1231 Encounter for screening mammogram for malignant neoplasm of breast: Secondary | ICD-10-CM | POA: Diagnosis not present

## 2017-12-09 DIAGNOSIS — Z23 Encounter for immunization: Secondary | ICD-10-CM

## 2017-12-09 DIAGNOSIS — E559 Vitamin D deficiency, unspecified: Secondary | ICD-10-CM

## 2017-12-09 DIAGNOSIS — R000 Tachycardia, unspecified: Secondary | ICD-10-CM

## 2017-12-09 DIAGNOSIS — I10 Essential (primary) hypertension: Secondary | ICD-10-CM

## 2017-12-09 DIAGNOSIS — Z1239 Encounter for other screening for malignant neoplasm of breast: Secondary | ICD-10-CM

## 2017-12-09 DIAGNOSIS — Z780 Asymptomatic menopausal state: Secondary | ICD-10-CM

## 2017-12-09 DIAGNOSIS — E538 Deficiency of other specified B group vitamins: Secondary | ICD-10-CM

## 2017-12-09 MED ORDER — METOPROLOL SUCCINATE ER 25 MG PO TB24: 25.0000 mg | ORAL_TABLET | Freq: Every day | ORAL | 1 refills | Status: DC

## 2017-12-09 NOTE — Progress Notes (Signed)
Patient ID: Dorothy Lee, female    DOB: 31-Oct-1940, 77 y.o.   MRN: [id number]  Chief Complaint  Patient presents with  . Establish Care  . Diverticulitis  . Blood Pressure Check    Allergies Crestor [rosuvastatin]; Lipitor [atorvastatin]; Lisinopril; and Simvastatin  Subjective:   Dorothy Lee is a 77 y.o. female who presents to [facility] today.  HPI Dorothy Lee presents as a new patient visit to establish care.  She has lived in [place], [place] for most of her life and recently moved back to [place], [place] where she lived as a child.  She was hospitalized in February 2019 for perforated sigmoid diverticulitis.  She is followed by Dr. Arnoldo Morale.  She has a history of coronary artery disease and has an upcoming appointment with Dr. Domenic Polite.  She had a recent stress test in the past several months and was placed on Imdur.  She comes in today to establish care and to have a check on her blood pressure.  She brings in readings of her blood pressure which have been running in the 120s-170s/70s-80s.  Her blood pressures have been a little more controlled since starting her Imdur back approximately 5 days ago.  This medication was stopped during her hospitalization and she did not restart it back until the previous week.  She was seen at Dr. Arnoldo Morale office and her blood pressure was in the 160s.  She denies any chest pain, shortness of breath, or palpitations.  She does report that her heart has been beating fast but she is not symptomatic.  She notices it when she checks her blood pressure.  She has a history of coronary artery disease and had stents placed in 2009.  She has a very strong family history of coronary artery disease/heart attack.  She is on Crestor 10 mg at night.  She denies any myalgias or side effects with the medication.  She has not had her cholesterol checked in over a year.  Her last LDL was over a year ago and it was in the 51s.  She does take  her aspirin on a daily basis.  She has never had a stroke or heart attack.  She was found to have coronary artery disease after having a stress test which was indicative of ischemia.  She does not smoke, drink alcohol or do any drugs.  She was married to her first husband, who passed away of cancer, and she is now remarried.  She had 1 son who passed away of a heart attack.  She attends church.  She is a retired Dorothy Lee.  She eats meat fruits and vegetables.  She does exercise occasionally.  She has a supportive family and community.  She denies any feelings of depression, hopelessness, or sadness.  She sleeps well.  Appetite is good.  She reports that initially upon discharge from the hospital her appetite was low but it is almost back to normal.  She did lose weight with her bout of diverticulitis.  She denies any night sweats.   She would like to get a bone mineral density.  She has not had her mammogram in quite a few years.  She is up-to-date with her colonoscopy and is followed by Dr. Sydell Axon.  She is only had 1 of her pneumonia shots.  Her tetanus shot is not up-to-date.  She reports her energy level is good.  She brings in her last labs that were done at her prior PCP  which reveal a vitamin D deficiency and a B12 deficiency.  She has been using some B12 over-the-counter.  She has never been on any blood pressure medication in the past.  She does however take the Imdur on a daily basis.  She denies any swelling in her extremities.  She completed the medication for her diverticulitis and her urinary tract infection.  She denies any pain in her abdomen.  Appetite is good.  Bowel movements are normal.  Denies any melena or bright red blood per rectum.  Urinating normal.  Denies any urinary frequency or hesitancy.  Denies any back pain, nausea, vomiting, or fevers.    Past Medical History:  Diagnosis Date  . Allergic rhinitis   . CAD (coronary artery disease)    DES RCA and  LAD 02/2008, DES mid LAD 11/2012  . Cancer (HCC)    Rt Breast  . Cough   . Diverticulosis   . Essential hypertension   . GERD (gastroesophageal reflux disease)   . Glaucoma   . History of breast cancer    Right - s/p XRT 1984, surgery, no chemo  . Hyperlipidemia   . Rotator cuff syndrome, left   . Scoliosis   . Urinary incontinence     Past Surgical History:  Procedure Laterality Date  . APPENDECTOMY  1981  . BREAST BIOPSY  1984  . BREAST LUMPECTOMY  1982   right breast  . COLONOSCOPY  2008   Dr. Oletta Lamas: diverticulosis, hemorrhoids, conscious sedation  . COLONOSCOPY N/A 01/30/2016   Dr. Gala Romney: mild divertiuculosis in sigmoid colon. narrowing of the colon in association with the deverticular opening. 6 polyps removed, tubular adenoma. next TCS in 3 years  . CORONARY ANGIOPLASTY WITH STENT PLACEMENT  02/2008  . ESOPHAGOGASTRODUODENOSCOPY  2014   Dr. Fuller Plan: erosive gastritis, no h.pylori  . ESOPHAGOGASTRODUODENOSCOPY N/A 01/30/2016   Dr. Gala Romney: normal esophagus s/p dilation, small hh.  Marland Kitchen LEFT HEART CATHETERIZATION WITH CORONARY ANGIOGRAM N/A 12/02/2012   Procedure: LEFT HEART CATHETERIZATION WITH CORONARY ANGIOGRAM;  Surgeon: Jettie Booze, MD;  Location: [facility] CATH LAB;  Service: Cardiovascular;  Laterality: N/A;  . Venia Minks DILATION N/A 01/30/2016   Procedure: Venia Minks DILATION;  Surgeon: Daneil Dolin, MD;  Location: [facility] ENDO SUITE;  Service: Endoscopy;  Laterality: N/A;  . NASAL SINUS SURGERY    . PERCUTANEOUS CORONARY STENT INTERVENTION (PCI-S)  12/02/2012   Procedure: PERCUTANEOUS CORONARY STENT INTERVENTION (PCI-S);  Surgeon: Jettie Booze, MD;  Location: [facility] CATH LAB;  Service: Cardiovascular;;  DES to the MID LAD    Family History  Problem Relation Age of Onset  . Emphysema Father   . Emphysema Brother   . Heart disease Brother        x3 brothers  . Lung cancer Brother   . Throat cancer Brother        head/neck cancer  . Heart attack Brother   . Colon cancer Neg  Hx      Social History   Socioeconomic History  . Marital status: Married    Spouse name: None  . Number of children: 1  . Years of education: None  . Highest education level: None  Social Needs  . Financial resource strain: None  . Food insecurity - worry: None  . Food insecurity - inability: None  . Transportation needs - medical: None  . Transportation needs - non-medical: None  Occupational History  . Occupation: Probation officer  . Occupation: COSMETOLOGY    Employer: [employer]  Tobacco Use  . Smoking status: Never Smoker  . Smokeless tobacco: Never Used  . Tobacco comment: Widowed, lives alone but has sig other.   Substance and Sexual Activity  . Alcohol use: Yes    Comment: social alcohol, glass of wine once a month  . Drug use: No  . Sexual activity: Not Currently  Other Topics Concern  . None  Social History Narrative   Widowed   Lives alone but has significant other, married in 2016-01-11.    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 Has two grandsons.        Review of Systems  Constitutional: Negative for activity change, appetite change, chills, fever and unexpected weight change.  HENT: Negative for dental problem, nosebleeds and trouble swallowing.   Eyes: Negative for visual disturbance.  Respiratory: Negative for apnea, cough, chest tightness, shortness of breath and wheezing.   Cardiovascular: Negative for chest pain, palpitations and leg swelling.  Gastrointestinal: Negative for abdominal distention, abdominal pain, blood in stool, constipation, nausea and vomiting.  Genitourinary: Negative for difficulty urinating, dysuria, flank pain, frequency, hematuria and urgency.  Musculoskeletal: Negative for back pain and myalgias.  Skin: Negative for rash.  Neurological: Negative for dizziness, tremors, syncope, weakness, light-headedness and numbness.    Hematological: Negative for adenopathy. Does not bruise/bleed easily.  Psychiatric/Behavioral: Negative for agitation, behavioral problems, dysphoric mood and sleep disturbance. The patient is not nervous/anxious.      Objective:   BP (!) 144/78 (BP Location: Left Arm, Patient Position: Sitting, Cuff Size: Normal)   Pulse 92   Temp 97.9 F (36.6 C) (Temporal)   Resp 16   Ht 5\' 3"  (1.6 m)   Wt 139 lb 4 oz (63.2 kg)   SpO2 95%   BMI 24.67 kg/m  Pulse recheck 102  Physical Exam  Constitutional: She is oriented to person, place, and time. She appears well-developed and well-nourished. No distress.  HENT:  Head: Normocephalic and atraumatic.  Right Ear: External ear normal.  Left Ear: External ear normal.  Mouth/Throat: Oropharynx is clear and moist. No oropharyngeal exudate.  Eyes: Conjunctivae and EOM are normal. Pupils are equal, round, and reactive to light. No scleral icterus.  Neck: Normal range of motion. Neck supple. No JVD present. No tracheal deviation present. No thyromegaly present.  Cardiovascular: Normal rate, regular rhythm and normal heart sounds.  Pulmonary/Chest: Effort normal and breath sounds normal. No respiratory distress.  Abdominal: Soft. Bowel sounds are normal. She exhibits no distension. There is no tenderness.  Musculoskeletal: Normal range of motion.  Lymphadenopathy:    She has no cervical adenopathy.  Neurological: She is alert and oriented to person, place, and time. No cranial nerve deficit.  Skin: Skin is warm and dry. Capillary refill takes less than 2 seconds. No rash noted.  Psychiatric: She has a normal mood and affect. Her behavior is normal. Judgment and thought content normal.  Nursing note and vitals reviewed.    Assessment and Plan  1. Tachycardia Possibly secondary to anxiety.  Readings from home does show intermittent tachycardia.  Check labs.  Keep scheduled appointment with cardiology. - Basic metabolic panel - CBC with  Differential/Platelet - TSH  2. Essential hypertension Patient is in need of blood pressure medication at this time.  Secondary to tachycardia and need for blood pressure medication will start metoprolol XL 25 mg 1 p.o. Daily. Patient counseled in  detail regarding the risks of medication. Told to call or return to clinic if develop any worrisome signs or symptoms. Patient voiced understanding.   - metoprolol succinate (TOPROL-XL) 25 MG 24 hr tablet; Take 1 tablet (25 mg total) by mouth daily.  Dispense: 30 tablet; Refill: 1  3. Post-menopausal - DG Bone Density; Future Bring in calcium and vitamin D supplements to office visit. 4. Immunization due Vaccination given. - Pneumococcal conjugate vaccine 13-valent IM  5. Coronary artery disease of native heart with stable angina pectoris, unspecified vessel or lesion type ([place]) Hyperlipidemia and the associated risk of ASCVD were discussed today. Primary vs. Secondary prevention of ASCVD were discussed and how it relates to patient morbidity, mortality, and quality of life. Shared decision making with patient including the risks of statins vs.benefits of ASCVD risk reduction discussed.  Risks of stains discussed including myopathy, rhabdomyoloysis, liver problems, increased risk of diabetes discussed. We discussed heart healthy diet, lifestyle modifications, risk factor modifications, and adherence to the recommended treatment plan. We discussed the need to periodically monitor lipid panel and liver function tests while on statin therapy.  Discussed LDL goal is less than 70.  Check today and titrate medication as needed. - Lipid panel  Patient was counseled concerning worrisome signs and symptoms of chest pain and if those occur to go to the emergency department/call 911.  Keep scheduled appointment with cardiology. 6. Vitamin D deficiency Labs reviewed from previous PCP, done approximately 1 year ago.  Check vitamin D levels. - Vitamin D (25  hydroxy)  7. High risk medication use Check due to statin use. - Hepatic function panel  8. Screening for breast cancer Screening ordered. - MM SCREENING BREAST TOMO BILATERAL; Future  9. B12 deficiency Check labs due to evidence of low B12 levels in the past. - B12 - Methylmalonic Acid  Records from PCP requested. Return in about 4 weeks (around 01/06/2018) for follow up. [name], MD 12/09/2017

## 2017-12-09 NOTE — Patient Instructions (Addendum)
Bring in your BP cuff to the  Next visit. Get your blood work today Can check your BP at home but do  Not stress over it.  Start the metoprolol xl 25 mg a day.

## 2017-12-10 ENCOUNTER — Telehealth: Payer: Self-pay | Admitting: Family Medicine

## 2017-12-10 NOTE — Telephone Encounter (Signed)
Patient called stating that she is still having headaches, day and night, that are keeping her awake. She states she called her sinus doctor and she is out and recommended she call Dr. Mannie Stabile. I asked if the pain was the same or worse. She states the pain is worse. She denies any chest pain. She started her BP medication today. Pulse is 101, BP is 164/80. I told her Dr. Mannie Stabile is out this afternoon but I Would send a message to her to see what she can recommend. In the meantime, I advised that if the headache becomes severe, she develops and chest pain, left arm pain, jaw pain or palpitations, to go to the ED- also if she becomes overly worried. She verbalized understanding.

## 2017-12-11 ENCOUNTER — Ambulatory Visit (INDEPENDENT_AMBULATORY_CARE_PROVIDER_SITE_OTHER): Payer: Medicare Other | Admitting: Internal Medicine

## 2017-12-11 ENCOUNTER — Encounter: Payer: Self-pay | Admitting: Internal Medicine

## 2017-12-11 VITALS — BP 124/86 | HR 92 | Ht 63.0 in | Wt 138.0 lb

## 2017-12-11 DIAGNOSIS — R911 Solitary pulmonary nodule: Secondary | ICD-10-CM | POA: Diagnosis not present

## 2017-12-11 DIAGNOSIS — R058 Other specified cough: Secondary | ICD-10-CM

## 2017-12-11 DIAGNOSIS — I25119 Atherosclerotic heart disease of native coronary artery with unspecified angina pectoris: Secondary | ICD-10-CM

## 2017-12-11 DIAGNOSIS — R05 Cough: Secondary | ICD-10-CM

## 2017-12-11 NOTE — Telephone Encounter (Signed)
Tried to call patient back, number is not in service. Unable to reach on husband's phone.

## 2017-12-11 NOTE — Progress Notes (Signed)
Subjective:   Patient ID: Dorothy Lee, female    DOB: 1941-06-16    MRN: [id number]   Brief patient profile:  10 yowf never smoker with recurrent cough x 2011 responsive to prednisone for weeks at a time off steroids s the need for cough meds (during the day, still tessalon at hs)  referred for consultation by Dr Basilio Cairo having seen Dr Gwenette Greet previously with dx of pnds/cyclical coughing but requesting a second opinion as cough never resolved until started symbicort 80 in 11/2013 c/w cough variant asthma   History of Present Illness  08/24/2013 1st [facility] office visit/ Mohid Furuya  Chief Complaint  Patient presents with  . Acute Visit    Pt states cough no better since last ov here in Sept. Cough is occ prod with foamy, white sputum.   no better with abx/ ent intervention/ always better with prednisone.  Presently finishing up round of abx/prednisone and "it's going away like it always does on prednisone" Tends to be worse when lies down with persistent sense of pnds which can disturb sleep as well but amount of mucus does not peak in ams and never purulent. No better/ worse on inhalers Assoc with urinary incont when severe rec Dexilant 60  Mg Take 30-60 min before first meal of the day  And at bedtime Pepcid 20 mg and chlortrimeton 4 mg until return Stop zyrtec  GERD (REFLUX)   For cough you can take the cough syrup or tramadol as needed  For flare return here with all medications in hand including all over the counters all inhalers    09/01/2013 Chief Complaint  Patient presents with  . Acute Visit    Cough with white and yellow mucus  Pt saw MW 11/17 and rec Arlyce Harman and cxr 11/2010 normal  Ct sinuses 05/2011: Acute and chronic sinusitis.  Full PFT's 11/2011: Normal  Dexilant 60 Mg Take 30-60 min before first meal of the day And at bedtime Pepcid 20 mg and chlortrimeton 4 mg until return  Stop zyrtec  Cough worse, now thick yellow. No real heartburn. No dyspnea.  Produces  mucus all the time.  rec Strict reflux diet Stay on Dexilant one daily and pepcid at night Prednisone 10mg  Take 4 for four days 3 for four days 2 for four days 1 for four days Augmentin twice daily for 10days Flonase two puff ea nostril Twice daily Stay on chlorpheniramine Follow cough protocol Stay off work ONE week, voice rest CT Sinus > declined       03/03/2014 f/u ov/Lethia Donlon re: best in years, attributes to starting symbicort 80 2bid/ not using med calendar, easily confused with details of care.   Chief Complaint  Patient presents with  . Follow-up    Pt states that her cough has resolved. No new co's today.    Only symptom is constant runny nose, watery, not waking at night at all - not needing any cough suppression at all  rec Continue symbicort 80 Take 2 puffs first thing in am and then another 2 puffs about 12 hours later.  Work on inhaler technique:    Stop all acid suppression and chlortimeton at bedtime to see what difference this makes     11/08/2014 f/u ov/Ahmaad Neidhardt re: chronic cough/ completely off symbiocrt x one month was on for cough variant asthma Chief Complaint  Patient presents with  . Acute Visit    pt c/o prod cough with yellow mucus, sinus congestion, sob when laying down X3 weeks.  symptom onset was gradual and w/in a week of stopping symbicort despite restarting gerd rx  rec Try dulera 100 Take 2 puffs first thing in am and then another 2 puffs about 12 hours later with all you medications and your drug formulary with your insurance company For cough use hydodan 1 tsp every 6 hours if needed          12/11/2017  f/u ov/Irys Nigh re:  SPN /UACS with cough since 2011  Chief Complaint  Patient presents with  . Follow-up    Nodule   Dyspnea:  Not limited by breathing from desired activities  / back in gym p admit with flu then diverticulitis d/c 2 weeks Cough: resolved  Sleep: ok  SABA use:  None needed  No obvious day to day or daytime variability or assoc  excess/ purulent sputum or mucus plugs or hemoptysis or cp or chest tightness, subjective wheeze or overt sinus or hb symptoms. No unusual exposure hx or h/o childhood pna/ asthma or knowledge of premature birth.  Sleeping ok flat without nocturnal  or early am exacerbation  of respiratory  c/o's or need for noct saba. Also denies any obvious fluctuation of symptoms with weather or environmental changes or other aggravating or alleviating factors except as outlined above   Current Allergies, Complete Past Medical History, Past Surgical History, Family History, and Social History were reviewed in [employer] record.  ROS  The following are not active complaints unless bolded Hoarseness, sore throat, dysphagia, dental problems, itching, sneezing,  nasal congestion or discharge of excess mucus or purulent secretions, ear ache,   fever, chills, sweats, unintended wt loss or wt gain, classically pleuritic or exertional cp,  orthopnea pnd or leg swelling, presyncope, palpitations, abdominal pain, anorexia, nausea, vomiting, diarrhea  or change in bowel habits or change in bladder habits, change in stools or change in urine, dysuria, hematuria,  rash, arthralgias, visual complaints, headache, numbness, weakness or ataxia or problems with walking or coordination,  change in mood/affect or memory.          Current Meds  Medication Sig  . aspirin EC 81 MG tablet Take 81 mg by mouth every morning.  Marland Kitchen HYDROcodone-homatropine (HYCODAN) 5-1.5 MG/5ML syrup Take 5 mLs by mouth every 6 (six) hours as needed.  . isosorbide mononitrate (IMDUR) 30 MG 24 hr tablet Take 1 tablet (30 mg total) by mouth daily.  Marland Kitchen latanoprost (XALATAN) 0.005 % ophthalmic solution Place 2 drops into both eyes at bedtime.  . metoprolol succinate (TOPROL-XL) 25 MG 24 hr tablet Take 1 tablet (25 mg total) by mouth daily.  . nitroGLYCERIN (NITROSTAT) 0.4 MG SL tablet Place 0.4 mg under the tongue every 5 (five) minutes x  3 doses as needed for chest pain. May repeat x3  . pantoprazole (PROTONIX) 40 MG tablet Take 40 mg by mouth daily.  . rosuvastatin (CRESTOR) 10 MG tablet Take 1 tablet (10 mg total) by mouth daily. OVERDUE FOR FOLLOW UP. CALL AND SCHEDULE [phone]                 Objective:   Physical Exam   03/03/2014 137 >  06/08/2014  137>  11/08/2014 144 > 10/04/2016   144 >  12/11/2017  138   amb wf nad   Vital signs reviewed - Note on arrival 02 sats  95% on RA      HEENT: nl dentition, turbinates bilaterally, and oropharynx. Nl external ear canals without cough reflex   NECK :  without JVD/Nodes/TM/ nl carotid upstrokes bilaterally   LUNGS: no acc muscle use,  Nl contour chest which is clear to A and P bilaterally without cough on insp or exp maneuvers   CV:  RRR  no s3 or murmur or increase in P2, and no edema   ABD:  soft and nontender with nl inspiratory excursion in the supine position. No bruits or organomegaly appreciated, bowel sounds nl  MS:  Nl gait/ ext warm without deformities, calf tenderness, cyanosis or clubbing No obvious joint restrictions   SKIN: warm and dry without lesions    NEURO:  alert, approp, nl sensorium with  no motor or cerebellar deficits apparent.                            Assessment & Plan:

## 2017-12-11 NOTE — Patient Instructions (Addendum)
No further ct scans needed at this point    If you are satisfied with your treatment plan,  let your doctor know and he/she can either refill your medications or you can return here when your prescription runs out.     If in any way you are not 100% satisfied,  please tell us.  If 100% better, tell your friends!  Pulmonary follow up is as needed

## 2017-12-11 NOTE — Telephone Encounter (Signed)
Please advise her to schedule a visit, she did not even mention headaches to me. We did not discuss the headaches, so I am unaware of what is going on. Gwen Her. Mannie Stabile, MD

## 2017-12-12 ENCOUNTER — Encounter: Payer: Self-pay | Admitting: Internal Medicine

## 2017-12-12 LAB — CBC WITH DIFFERENTIAL/PLATELET
BASOS PCT: 1 %
Basophils Absolute: 57 cells/uL (ref 0–200)
Eosinophils Absolute: 713 cells/uL — ABNORMAL HIGH (ref 15–500)
Eosinophils Relative: 12.5 %
HCT: 38.8 % (ref 35.0–45.0)
Hemoglobin: 12.6 g/dL (ref 11.7–15.5)
Lymphs Abs: 1790 cells/uL (ref 850–3900)
MCH: 28.8 pg (ref 27.0–33.0)
MCHC: 32.5 g/dL (ref 32.0–36.0)
MCV: 88.6 fL (ref 80.0–100.0)
MONOS PCT: 10.5 %
MPV: 10.1 fL (ref 7.5–12.5)
Neutro Abs: 2542 cells/uL (ref 1500–7800)
Neutrophils Relative %: 44.6 %
PLATELETS: 451 10*3/uL — AB (ref 140–400)
RBC: 4.38 10*6/uL (ref 3.80–5.10)
RDW: 12.5 % (ref 11.0–15.0)
TOTAL LYMPHOCYTE: 31.4 %
WBC mixed population: 599 cells/uL (ref 200–950)
WBC: 5.7 10*3/uL (ref 3.8–10.8)

## 2017-12-12 LAB — HEPATIC FUNCTION PANEL
AG Ratio: 1.5 (calc) (ref 1.0–2.5)
ALKALINE PHOSPHATASE (APISO): 65 U/L (ref 33–130)
ALT: 15 U/L (ref 6–29)
AST: 24 U/L (ref 10–35)
Albumin: 3.9 g/dL (ref 3.6–5.1)
BILIRUBIN INDIRECT: 0.3 mg/dL (ref 0.2–1.2)
BILIRUBIN TOTAL: 0.4 mg/dL (ref 0.2–1.2)
Bilirubin, Direct: 0.1 mg/dL (ref 0.0–0.2)
GLOBULIN: 2.6 g/dL (ref 1.9–3.7)
Total Protein: 6.5 g/dL (ref 6.1–8.1)

## 2017-12-12 LAB — LIPID PANEL
CHOLESTEROL: 173 mg/dL (ref <200)
HDL: 43 mg/dL — AB (ref >50)
LDL Cholesterol (Calc): 98 mg/dL (calc)
Non-HDL Cholesterol (Calc): 130 mg/dL (calc) — ABNORMAL HIGH (ref <130)
Total CHOL/HDL Ratio: 4 (calc) (ref <5.0)
Triglycerides: 204 mg/dL — ABNORMAL HIGH (ref <150)

## 2017-12-12 LAB — BASIC METABOLIC PANEL
BUN: 12 mg/dL (ref 7–25)
CHLORIDE: 105 mmol/L (ref 98–110)
CO2: 30 mmol/L (ref 20–32)
CREATININE: 0.79 mg/dL (ref 0.60–0.93)
Calcium: 9.1 mg/dL (ref 8.6–10.4)
Glucose, Bld: 101 mg/dL (ref 65–139)
POTASSIUM: 4 mmol/L (ref 3.5–5.3)
Sodium: 142 mmol/L (ref 135–146)

## 2017-12-12 LAB — TSH: TSH: 0.92 mIU/L (ref 0.40–4.50)

## 2017-12-12 LAB — VITAMIN B12: Vitamin B-12: 888 pg/mL (ref 200–1100)

## 2017-12-12 LAB — METHYLMALONIC ACID, SERUM: Methylmalonic Acid, Quant: 152 nmol/L (ref 87–318)

## 2017-12-12 LAB — VITAMIN D 25 HYDROXY (VIT D DEFICIENCY, FRACTURES): Vit D, 25-Hydroxy: 17 ng/mL — ABNORMAL LOW (ref 30–100)

## 2017-12-12 NOTE — Assessment & Plan Note (Signed)
Dorothy Lee and cxr 11/2010 normal Ct sinuses 05/2011:  Acute and chronic sinusitis. Full PFT's 11/2011:  Normal  CT sinuses 11/2012:  Thickening of sinuses without significant fluid.  Followed by ENT Wilburn Cornelia) - Rec repeat CT 09/02/13 by Dr Joya Gaskins > declined  - MCT rec 09/22/2013 > not done  -Med Calendar 12/15/2013  - Allergy profile 01/19/14 >  Eos 12.8% and IgE 249 with Pos Rast for trees, grasses ragweeds does not correlate with cough  - 2016 sinus surgery in [place] (pt unclear on date ) did not help sense of pnds    Consider referral to allergy in [place] or Dr Neldon Mc here prn   No need for pulmonary f/u for this problem

## 2017-12-12 NOTE — Assessment & Plan Note (Addendum)
Never smoker  See CT abd 08/17/16 x 4 mm LLL with nothing else viz on plain cxr 10/04/2016  - CT abd 11/17/17  . Stable 4 mm nodule in the left lower lobe> no directed f/u as pt very low risk and nodule 23mm unchanged x 1.5 y   CT results reviewed with pt >>> Too small for PET or bx, not suspicious enough for excisional bx > really only option for now is follow the Fleischner society guidelines as rec by radiology = no directed f/u indicated.  Pulmonary f/u can be prn   > 50 % of ov time spent in counseling

## 2017-12-13 NOTE — Telephone Encounter (Signed)
Called patient regarding message below. No answer, unable to leave message.  

## 2017-12-17 ENCOUNTER — Encounter: Payer: Self-pay | Admitting: Family Medicine

## 2017-12-23 ENCOUNTER — Telehealth: Payer: Self-pay | Admitting: Family Medicine

## 2017-12-23 DIAGNOSIS — S0990XA Unspecified injury of head, initial encounter: Secondary | ICD-10-CM | POA: Diagnosis not present

## 2017-12-23 DIAGNOSIS — R51 Headache: Secondary | ICD-10-CM | POA: Diagnosis not present

## 2017-12-23 DIAGNOSIS — R42 Dizziness and giddiness: Secondary | ICD-10-CM | POA: Diagnosis not present

## 2017-12-23 NOTE — Telephone Encounter (Signed)
Called patient to find out why she is requesting this referral. I can not get through to her- her number is out of service per the recording. I need to know why she is requesting this referral. It is not documented in the office note that there is a need for this.

## 2017-12-23 NOTE — Telephone Encounter (Signed)
Patient's husband is calling in to check the status of a gyn referral for patient, she is requesting a female.

## 2017-12-24 ENCOUNTER — Ambulatory Visit (INDEPENDENT_AMBULATORY_CARE_PROVIDER_SITE_OTHER): Payer: Medicare Other | Admitting: General Surgery

## 2017-12-24 ENCOUNTER — Encounter: Payer: Self-pay | Admitting: General Surgery

## 2017-12-24 VITALS — BP 157/72 | HR 72 | Temp 98.0°F | Ht 63.0 in | Wt 139.0 lb

## 2017-12-24 DIAGNOSIS — K5792 Diverticulitis of intestine, part unspecified, without perforation or abscess without bleeding: Secondary | ICD-10-CM

## 2017-12-24 DIAGNOSIS — I25119 Atherosclerotic heart disease of native coronary artery with unspecified angina pectoris: Secondary | ICD-10-CM

## 2017-12-24 NOTE — Progress Notes (Signed)
Subjective:     Dorothy Lee  Here for hospitalization for diverticulitis with perforation.  Patient states she has minimal intermittent left lower quadrant abdominal pain.  She has significantly improved since her admission.  She is eating what she wants to.  Her energy level is improving.  She was recently seen by pulmonology and has been told her cough was secondary to reflux.  Her pulmonary nodule was considered insignificant and could be followed by her primary care physician. Objective:    BP (!) 157/72   Pulse 72   Temp 98 F (36.7 C)   Ht 5\' 3"  (1.6 m)   Wt 139 lb (63 kg)   BMI 24.62 kg/m   General:  alert, cooperative and no distress       Assessment:    Sigmoid diverticulitis with perforation, resolved.    Plan:  As this was her second episode of diverticulitis with perforation, she does want elective partial colectomy.  She needs to travel to [place] first prior to surgical intervention.  That is fine with me.  She will call to schedule her partial colectomy.  The risks and benefits of the procedure including bleeding, infection, anastomotic leak, and the possibility of recurrence of the diverticulitis were fully explained to the patient, who gave informed consent.

## 2017-12-24 NOTE — Telephone Encounter (Signed)
Yes this is fine.  She should also know that she can come to our office for her gynecologic care.  But referral to [facility] is fine.

## 2017-12-24 NOTE — Patient Instructions (Signed)
Open Colectomy An open colectomy is surgery to remove part or all of the large intestine (colon). This procedure may be used to treat several conditions, including:  Inflammation and infection of the colon (diverticulitis).  Tumors or masses in the colon.  Inflammatory bowel disease, such as Crohn disease or ulcerative colitis.  Bleeding from the colon.  Blockage or obstruction of the colon.  Tell a health care provider about:  Any allergies you have.  All medicines you are taking, including vitamins, herbs, eye drops, creams, and over-the-counter medicines.  Any problems you or family members have had with anesthetic medicines.  Any blood disorders you have.  Any surgeries you have had.  Any medical conditions you have.  Whether you are pregnant or may be pregnant.  Whether you smoke or use tobacco products. These can affect your body's reaction to anesthesia. What are the risks? Generally, this is a safe procedure. However, problems may occur, including:  Infection.  Bleeding.  Allergic reactions to medicines.  Damage to other structures or organs.  Pneumonia.  The incision opening up.  Tissues from inside the abdomen bulging through the incision (hernia).  Reopening of the colon where it was stitched or stapled together.  A blood clot forming in a vein and traveling to the lungs.  Future blockage of the small intestine from scar tissue.  What happens before the procedure? Staying hydrated Follow instructions from your health care provider about hydration, which may include:  Up to 2 hours before the procedure - you may continue to drink clear liquids, such as water, clear fruit juice, black coffee, and plain tea.  Eating and drinking restrictions Follow instructions from your health care provider about eating and drinking, which may include:  8 hours before the procedure - stop eating heavy meals or foods such as meat, fried foods, or fatty foods.  6  hours before the procedure - stop eating light meals or foods, such as toast or cereal.  6 hours before the procedure - stop drinking milk or drinks that contain milk.  2 hours before the procedure - stop drinking clear liquids.  Bowel prep In some cases, you may be prescribed an oral bowel prep to clean out your colon. If so:  Take it as told by your health care provider. Starting the day before your procedure, you may need to drink a large amount of medicated liquid. The liquid will cause you to have multiple loose stools until your stool is almost clear or light green.  Follow instructions from your health care provider about eating and drinking restrictions during bowel prep.  Medicines  Ask your health care provider about: ? Changing or stopping your regular medicines or vitamins. This is especially important if you are taking diabetes medicines, blood thinners, or vitamin E. ? Taking medicines such as aspirin and ibuprofen. These medicines can thin your blood. Do not take these medicines before your procedure if your health care provider instructs you not to.  If you were prescribed an antibiotic medicine, take it as told by your health care provider. General instructions  Bring loose-fitting, comfortable clothing and slip-on shoes that you can put on without bending over.  Make sure to see your health care provider for any tests that you need before the procedure, such as: ? Blood tests. ? A test to check the heart's rhythm (electrocardiogram, ECG). ? A CT scan of your abdomen. ? Urine tests. ? Colonoscopy.  Plan to have someone take you home from the   hospital or clinic.  Arrange for someone to help you with your activities during your recovery. What happens during the procedure?  To reduce your risk of infection: ? Your health care team will wash or sanitize their hands. ? Your skin will be washed with soap. ? Hair may be removed from the surgical area.  An IV tube  will be inserted into one of your veins. The tube will be used to give you medicines and fluids.  You will be given a medicine to make you fall asleep (general anesthetic). You may also be given a medicine to help you relax (sedative).  Small monitors will be connected to your body. They will be used to check your heart, blood pressure, and oxygen level.  A breathing tube may be placed into your lungs during the procedure.  A thin, flexible tube (catheter) will be placed into your bladder to drain urine.  A tube may be inserted through your nose and into your stomach (nasogastric tube, or NG tube). The tube is used to remove stomach fluids after surgery until the intestines start working again.  An incision will be made in your abdomen.  Clamps or staples will be put on your colon.  The part of the colon between the clamps or staples will be removed.  The ends of the colon that remain will be stitched or stapled together.  The incision in your abdomen will be closed with stitches (sutures) or staples.  The incision will be covered with a bandage (dressing).  A small opening (stoma) may be created in your lower abdomen. A removable, external pouch (ostomy pouch) will be attached to the stoma. This pouch will collect stool outside of your body. Stool passes through the stoma and into the pouch instead of through your anus. The procedure may vary among health care providers and hospitals. What happens after the procedure?  Your blood pressure, heart rate, breathing rate, and blood oxygen level will be monitored until the medicines you were given have worn off.  You may continue to receive fluids and medicines through an IV tube.  You will start on a clear liquid diet and gradually go back to a normal diet.  Do not drive until your health care provider approves.  You may have some pain in your abdomen. You will be given pain medicine to control the pain.  You will be encouraged to  do the following: ? Do breathing exercises to prevent pneumonia. ? Get up and start walking within a day after surgery. You should try to get up 5-6 times a day. This information is not intended to replace advice given to you by your health care provider. Make sure you discuss any questions you have with your health care provider. Document Released: 07/22/2009 Document Revised: 06/25/2016 Document Reviewed: 06/25/2016 Elsevier Interactive Patient Education  2018 Elsevier Inc.  

## 2017-12-24 NOTE — Telephone Encounter (Signed)
She just wants all her doctors in [place]. No specific reason per her husband ralph. [phone]

## 2017-12-24 NOTE — Telephone Encounter (Signed)
Okay to refer to [facility]?

## 2017-12-24 NOTE — Telephone Encounter (Signed)
Patient informed of message below, verbalized understanding. She will call back to let me know if she wants referral or if she wants [place] to handle gynecologic care.

## 2017-12-26 ENCOUNTER — Ambulatory Visit (HOSPITAL_COMMUNITY)
Admission: RE | Admit: 2017-12-26 | Discharge: 2017-12-26 | Disposition: A | Discharge status: Home / Self Care | Payer: Medicare Other | Source: Ambulatory Visit | Attending: Family Medicine | Admitting: Family Medicine

## 2017-12-26 ENCOUNTER — Encounter (HOSPITAL_COMMUNITY): Payer: Self-pay

## 2017-12-26 DIAGNOSIS — Z1239 Encounter for other screening for malignant neoplasm of breast: Secondary | ICD-10-CM

## 2017-12-26 DIAGNOSIS — Z780 Asymptomatic menopausal state: Secondary | ICD-10-CM | POA: Diagnosis not present

## 2017-12-26 DIAGNOSIS — Z1231 Encounter for screening mammogram for malignant neoplasm of breast: Secondary | ICD-10-CM | POA: Insufficient documentation

## 2017-12-26 DIAGNOSIS — M85832 Other specified disorders of bone density and structure, left forearm: Secondary | ICD-10-CM | POA: Insufficient documentation

## 2017-12-26 DIAGNOSIS — M810 Age-related osteoporosis without current pathological fracture: Secondary | ICD-10-CM | POA: Diagnosis not present

## 2017-12-26 HISTORY — DX: Personal history of irradiation: Z92.3

## 2017-12-27 ENCOUNTER — Encounter: Payer: Self-pay | Admitting: Family Medicine

## 2018-01-03 ENCOUNTER — Ambulatory Visit: Payer: Medicare Other | Admitting: Family Medicine

## 2018-01-07 ENCOUNTER — Telehealth: Payer: Self-pay | Admitting: Family Medicine

## 2018-01-07 ENCOUNTER — Ambulatory Visit: Payer: Medicare Other | Admitting: Family Medicine

## 2018-01-07 NOTE — Telephone Encounter (Signed)
CALLED PATIENT TO R/S APPT NO ANSWER LVM

## 2018-01-08 ENCOUNTER — Ambulatory Visit: Payer: Medicare Other | Admitting: Family Medicine

## 2018-01-08 DIAGNOSIS — Z961 Presence of intraocular lens: Secondary | ICD-10-CM | POA: Diagnosis not present

## 2018-01-08 DIAGNOSIS — H401132 Primary open-angle glaucoma, bilateral, moderate stage: Secondary | ICD-10-CM | POA: Diagnosis not present

## 2018-01-23 ENCOUNTER — Encounter (INDEPENDENT_AMBULATORY_CARE_PROVIDER_SITE_OTHER): Payer: Self-pay

## 2018-01-23 ENCOUNTER — Ambulatory Visit (INDEPENDENT_AMBULATORY_CARE_PROVIDER_SITE_OTHER): Payer: Medicare Other | Admitting: Advanced Practice Midwife

## 2018-01-23 ENCOUNTER — Encounter: Payer: Self-pay | Admitting: Advanced Practice Midwife

## 2018-01-23 VITALS — BP 140/82 | HR 84 | Ht 62.25 in | Wt 138.5 lb

## 2018-01-23 DIAGNOSIS — I25119 Atherosclerotic heart disease of native coronary artery with unspecified angina pectoris: Secondary | ICD-10-CM

## 2018-01-23 DIAGNOSIS — N941 Unspecified dyspareunia: Secondary | ICD-10-CM

## 2018-01-23 DIAGNOSIS — N3941 Urge incontinence: Secondary | ICD-10-CM

## 2018-01-23 MED ORDER — OSPEMIFENE 60 MG PO TABS: 1.0000 | ORAL_TABLET | Freq: Every day | ORAL | 11 refills | Status: DC

## 2018-01-23 NOTE — Patient Instructions (Signed)
Vaginal Moisturizer (found in the feminine product aisle) 2-3 times a week

## 2018-01-24 DIAGNOSIS — N941 Unspecified dyspareunia: Secondary | ICD-10-CM | POA: Insufficient documentation

## 2018-01-24 NOTE — Progress Notes (Signed)
[facility] Visit  Patient name: Dorothy Lee MRN [id number]  Date of birth: 1941/05/10  CC & HPI:  Dorothy Lee is a 77 y.o. Caucasian female presenting today for dyspareunia. Also has noticed some urge incontinence.  Recently got married and states that intercourse hurts "the whole time".  Feels pain in vagina w/entrance and also w/deeper penetration. Uses lubricant.  Rarely leaks urine when coughing /sneezing, mainly "when I really have to go"  Had pap smear last year, current w/mammograms  Pertinent History Reviewed:  Medical & Surgical Hx:   Past Medical History:  Diagnosis Date  . Allergic rhinitis   . CAD (coronary artery disease)    DES RCA and LAD 02/2008, DES mid LAD 11/2012  . Cancer (HCC)    Rt Breast  . Cough   . Diverticulitis   . Diverticulosis   . Essential hypertension   . GERD (gastroesophageal reflux disease)   . Glaucoma   . History of breast cancer    Right - s/p XRT 1984, surgery, no chemo  . Hyperlipidemia   . Personal history of radiation therapy   . Rotator cuff syndrome, left   . Scoliosis   . Urinary incontinence    Past Surgical History:  Procedure Laterality Date  . APPENDECTOMY  1981  . BREAST BIOPSY  1984  . BREAST LUMPECTOMY  1982   right breast  . COLONOSCOPY  2008   Dr. Oletta Lamas: diverticulosis, hemorrhoids, conscious sedation  . COLONOSCOPY N/A 01/30/2016   Dr. Gala Romney: mild divertiuculosis in sigmoid colon. narrowing of the colon in association with the deverticular opening. 6 polyps removed, tubular adenoma. next TCS in 3 years  . CORONARY ANGIOPLASTY WITH STENT PLACEMENT  02/2008  . ESOPHAGOGASTRODUODENOSCOPY  2014   Dr. Fuller Plan: erosive gastritis, no h.pylori  . ESOPHAGOGASTRODUODENOSCOPY N/A 01/30/2016   Dr. Gala Romney: normal esophagus s/p dilation, small hh.  Marland Kitchen LEFT HEART CATHETERIZATION WITH CORONARY ANGIOGRAM N/A 12/02/2012   Procedure: LEFT HEART CATHETERIZATION WITH CORONARY ANGIOGRAM;  Surgeon: Jettie Booze, MD;   Location: [facility] CATH LAB;  Service: Cardiovascular;  Laterality: N/A;  . Venia Minks DILATION N/A 01/30/2016   Procedure: Venia Minks DILATION;  Surgeon: Daneil Dolin, MD;  Location: [facility] ENDO SUITE;  Service: Endoscopy;  Laterality: N/A;  . NASAL SINUS SURGERY    . PERCUTANEOUS CORONARY STENT INTERVENTION (PCI-S)  12/02/2012   Procedure: PERCUTANEOUS CORONARY STENT INTERVENTION (PCI-S);  Surgeon: Jettie Booze, MD;  Location: [facility] CATH LAB;  Service: Cardiovascular;;  DES to the MID LAD   Family History  Problem Relation Age of Onset  . Emphysema Father   . Emphysema Brother   . Heart disease Brother        x3 brothers  . Lung cancer Brother   . Throat cancer Brother        head/neck cancer  . Heart attack Brother   . Heart attack Sister   . Heart attack Son        blood clot  . Colon cancer Neg Hx     Current Outpatient Medications:  .  aspirin EC 81 MG tablet, Take 81 mg by mouth every morning., Disp: , Rfl:  .  HYDROcodone-homatropine (HYCODAN) 5-1.5 MG/5ML syrup, Take 5 mLs by mouth every 6 (six) hours as needed., Disp: 120 mL, Rfl: 0 .  isosorbide mononitrate (IMDUR) 30 MG 24 hr tablet, Take 1 tablet (30 mg total) by mouth daily., Disp: 90 tablet, Rfl: 0 .  latanoprost (XALATAN) 0.005 % ophthalmic solution,  Place 2 drops into both eyes at bedtime., Disp: , Rfl:  .  metoprolol succinate (TOPROL-XL) 25 MG 24 hr tablet, Take 1 tablet (25 mg total) by mouth daily., Disp: 30 tablet, Rfl: 1 .  nitroGLYCERIN (NITROSTAT) 0.4 MG SL tablet, Place 0.4 mg under the tongue every 5 (five) minutes x 3 doses as needed for chest pain. May repeat x3, Disp: , Rfl:  .  pantoprazole (PROTONIX) 40 MG tablet, Take 40 mg by mouth daily., Disp: , Rfl:  .  rosuvastatin (CRESTOR) 10 MG tablet, Take 1 tablet (10 mg total) by mouth daily. OVERDUE FOR FOLLOW UP. CALL AND SCHEDULE [phone], Disp: 30 tablet, Rfl: 0 .  Ospemifene 60 MG TABS, Take 1 tablet by mouth daily., Disp: 30 tablet, Rfl: 11 Social History:  Reviewed -  reports that she has never smoked. She has never used smokeless tobacco.  Review of Systems:   Constitutional: Negative for fever and chills Eyes: Negative for visual disturbances Respiratory: Negative for shortness of breath, dyspnea Cardiovascular: Negative for chest pain or palpitations  Gastrointestinal: Negative for vomiting, diarrhea and constipation; no abdominal pain Genitourinary: Negative for dysuria and urgency, vaginal irritation or itching Musculoskeletal: Negative for back pain, joint pain, myalgias  Neurological: Negative for dizziness and headaches    Objective Findings:    Physical Examination: Vitals:   01/23/18 1118  BP: 140/82  Pulse: 84   General appearance - well appearing, and in no distress Mental status - alert, oriented to person, place, and time Chest:  Normal respiratory effort Heart - normal rate and regular rhythm Abdomen:  Soft, nontender Pelvic: somewhat atrophic vagina, excellent tone, no decensus w/valsalva.  Musculoskeletal:  Normal range of motion without pain Extremities:  No edema    No results found for this or any previous visit (from the past 24 hour(s)).    Assessment & Plan:  A:   dyspareunia P:  Discussed w/Dr. Elonda Husky.  Recommended osphena (not an estrogen, states that it is not contraindicated w/hx of breast cancer).  Also recommend vaginal moisturizer.   F/U in 12 weeks or so and let me know how it's going. Continue lubricant PRN  Consider oxybutin or similar for urge incontinence if osphena doesn't improve sx  Return for If you have any problems.  Christin Fudge CNM 01/24/2018 12:37 AM

## 2018-01-28 ENCOUNTER — Ambulatory Visit (INDEPENDENT_AMBULATORY_CARE_PROVIDER_SITE_OTHER): Payer: Medicare Other | Admitting: Gastroenterology

## 2018-01-28 ENCOUNTER — Telehealth: Payer: Self-pay | Admitting: *Deleted

## 2018-01-28 ENCOUNTER — Encounter: Payer: Self-pay | Admitting: Gastroenterology

## 2018-01-28 VITALS — BP 164/76 | HR 80 | Temp 97.0°F | Ht 62.0 in | Wt 139.0 lb

## 2018-01-28 DIAGNOSIS — K5720 Diverticulitis of large intestine with perforation and abscess without bleeding: Secondary | ICD-10-CM

## 2018-01-28 DIAGNOSIS — I25119 Atherosclerotic heart disease of native coronary artery with unspecified angina pectoris: Secondary | ICD-10-CM | POA: Diagnosis not present

## 2018-01-28 NOTE — Patient Instructions (Signed)
I am glad you are doing well!  We will see you in January 2020 to arrange a colonoscopy.  Have a great trip!  It was a pleasure to see you today. I strive to create trusting relationships with patients to provide genuine, compassionate, and quality care. I value your feedback. If you receive a survey regarding your visit,  I greatly appreciate you taking time to fill this out.   Annitta Needs, PhD, ANP-BC [facility]

## 2018-01-28 NOTE — Telephone Encounter (Signed)
Osphena approved, pharmacy notified.

## 2018-01-29 ENCOUNTER — Other Ambulatory Visit: Payer: Self-pay

## 2018-01-29 ENCOUNTER — Ambulatory Visit: Payer: Medicare Other | Admitting: Gastroenterology

## 2018-01-29 ENCOUNTER — Encounter: Payer: Self-pay | Admitting: Family Medicine

## 2018-01-29 ENCOUNTER — Ambulatory Visit (INDEPENDENT_AMBULATORY_CARE_PROVIDER_SITE_OTHER): Payer: Medicare Other | Admitting: Family Medicine

## 2018-01-29 VITALS — BP 120/70 | HR 70 | Temp 98.2°F | Resp 16 | Ht 63.0 in | Wt 139.0 lb

## 2018-01-29 DIAGNOSIS — M25512 Pain in left shoulder: Secondary | ICD-10-CM | POA: Diagnosis not present

## 2018-01-29 DIAGNOSIS — M810 Age-related osteoporosis without current pathological fracture: Secondary | ICD-10-CM

## 2018-01-29 DIAGNOSIS — G8929 Other chronic pain: Secondary | ICD-10-CM

## 2018-01-29 DIAGNOSIS — E559 Vitamin D deficiency, unspecified: Secondary | ICD-10-CM | POA: Diagnosis not present

## 2018-01-29 MED ORDER — VITAMIN D (ERGOCALCIFEROL) 1.25 MG (50000 UNIT) PO CAPS: 50000.0000 [IU] | ORAL_CAPSULE | ORAL | 0 refills | Status: DC

## 2018-01-29 NOTE — Progress Notes (Signed)
Patient ID: Dorothy Lee, female    DOB: August 31, 1941, 77 y.o.   MRN: [id number]  Chief Complaint  Patient presents with  . Follow-up    Allergies Crestor [rosuvastatin]; Lipitor [atorvastatin]; Lisinopril; and Simvastatin  Subjective:   Dorothy Lee is a 77 y.o. female who presents to [facility] today.  HPI Here to discuss her BMD. Has it perfomed in 12/2017. The results did show osteoporosis at her left hip.  Does reportedly take some calcium but is not sure of the dose.  Does not take any vitamin D.  Had vitamin D testing done a few weeks ago which revealed vitamin D deficiency.  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 Exercises at the gym three times a week. Has been seen by Dr. Kenton Kingfisher in [place], [place], but has not been seen in quite some time. Left shoulder pain has been bothering her for years. Bothers on a daily basis. No numbness or tingling but has pain in shoulder. Has used tylenol for shoulder and it does help some. Has used Alleve several time b/c of the pain and it helps. Pain is worse in shoulder with elevation or arm and reaching behind her.  Reports that years ago she did have a steroid injection into her shoulder which helped tremendously.  Followed by cardiology.  Has a history of several stents placed.  Takes her cholesterol medication each day.  Is unable to increase her dose due to statin intolerance and myalgias.  No chest pain. No SOB.  Reports that she feels good.  Mood is good.  Energy good.  Sleeping well.  Had recent mammogram performed secondary to history of breast cancer.   Past Medical History:  Diagnosis Date  . Allergic rhinitis   . CAD (coronary artery disease)    DES RCA and LAD 02/2008, DES mid LAD 11/2012  . Cancer (HCC)    Rt Breast  . Cough   . Diverticulitis   . Diverticulosis   . Essential hypertension   . GERD (gastroesophageal reflux disease)   . Glaucoma   . History of breast cancer    Right - s/p XRT  1984, surgery, no chemo  . Hyperlipidemia   . Personal history of radiation therapy   . Rotator cuff syndrome, left   . Scoliosis   . Urinary incontinence     Past Surgical History:  Procedure Laterality Date  . APPENDECTOMY  1981  . BREAST BIOPSY  1984  . BREAST LUMPECTOMY  1982   right breast  . COLONOSCOPY  2008   Dr. Oletta Lamas: diverticulosis, hemorrhoids, conscious sedation  . COLONOSCOPY N/A 01/30/2016   Dr. Gala Romney: mild divertiuculosis in sigmoid colon. narrowing of the colon in association with the deverticular opening. 6 polyps removed, tubular adenoma. next TCS in 3 years  . CORONARY ANGIOPLASTY WITH STENT PLACEMENT  02/2008  . ESOPHAGOGASTRODUODENOSCOPY  2014   Dr. Fuller Plan: erosive gastritis, no h.pylori  . ESOPHAGOGASTRODUODENOSCOPY N/A 01/30/2016   Dr. Gala Romney: normal esophagus s/p dilation, small hh.  Marland Kitchen LEFT HEART CATHETERIZATION WITH CORONARY ANGIOGRAM N/A 12/02/2012   Procedure: LEFT HEART CATHETERIZATION WITH CORONARY ANGIOGRAM;  Surgeon: Jettie Booze, MD;  Location: [facility] CATH LAB;  Service: Cardiovascular;  Laterality: N/A;  . Venia Minks DILATION N/A 01/30/2016   Procedure: Venia Minks DILATION;  Surgeon: Daneil Dolin, MD;  [facility] ENDO [facility];  Service: Endoscopy;  Laterality: N/A;  . NASAL SINUS SURGERY    . PERCUTANEOUS CORONARY STENT INTERVENTION (PCI-S)  12/02/2012   Procedure: PERCUTANEOUS CORONARY STENT INTERVENTION (PCI-S);  Surgeon: Jettie Booze, MD;  Location: [facility] CATH LAB;  Service: Cardiovascular;;  DES to the MID LAD    Family History  Problem Relation Age of Onset  . Emphysema Father   . Emphysema Brother   . Heart disease Brother        x3 brothers  . Lung cancer Brother   . Throat cancer Brother        head/neck cancer  . Heart attack Brother   . Heart attack Sister   . Heart attack Son        blood clot  . Colon cancer Neg Hx      Social History   Socioeconomic History  . Marital status: Married    Spouse name: Not on file  . Number  of children: 1  . Years of education: Not on file  . Highest education level: Not on file  Occupational History  . Occupation: Probation officer  . Occupation: COSMETOLOGY    Employer: [employer]  . Financial resource strain: Not on file  . Food insecurity:    Worry: Not on file    Inability: Not on file  . Transportation needs:    Medical: Not on file    Non-medical: Not on file  Tobacco Use  . Smoking status: Never Smoker  . Smokeless tobacco: Never Used  . Tobacco comment: Widowed, lives alone but has sig other.   Substance and Sexual Activity  . Alcohol use: Not Currently  . Drug use: No  . Sexual activity: Yes    Birth control/protection: Post-menopausal  Lifestyle  . Physical activity:    Days per week: Not on file    Minutes per session: Not on file  . Stress: Not on file  Relationships  . Social connections:    Talks on phone: Not on file    Gets together: Not on file    Attends religious service: Not on file    Active member of club or organization: Not on file    Attends meetings of clubs or organizations: Not on file    Relationship status: Not on file  Other Topics Concern  . Not on file  Social History Narrative   Widowed, husband died.    Lives alone but has significant other, married in 01/12/16 to [place].    Had one son, but he passed away.    Retired as Probation officer - Looking ahead [employer] and part-time at [employer]   Son died in 2016/01/12 of AMI. Has two grandsons.        Review of Systems  Constitutional: Negative for activity change, appetite change and fever.  Eyes: Negative for visual disturbance.  Respiratory: Negative for cough, chest tightness and shortness of breath.   Cardiovascular: Negative for chest pain, palpitations and leg swelling.  Gastrointestinal: Negative for abdominal pain, nausea and vomiting.  Genitourinary: Negative for dysuria, frequency and urgency.  Musculoskeletal: Positive for arthralgias.        Shoulder pain.   Neurological: Negative for dizziness, syncope and light-headedness.  Hematological: Negative for adenopathy.     Objective:   BP 120/70 (BP Location: Left Arm, Patient Position: Sitting, Cuff Size: Normal)   Pulse 70   Temp 98.2 F (36.8 C) (Temporal)   Resp 16   Ht 5\' 3"  (1.6 m)   Wt 139 lb (63 kg)   SpO2 98%   BMI 24.62 kg/m  Physical Exam  Constitutional: She is oriented to person, place, and time. She appears well-developed and well-nourished. No distress.  HENT:  Head: Normocephalic and atraumatic.  Eyes: Pupils are equal, round, and reactive to light.  Neck: Normal range of motion. Neck supple. No thyromegaly present.  Cardiovascular: Normal rate, regular rhythm and normal heart sounds.  Pulmonary/Chest: Effort normal and breath sounds normal. No respiratory distress.  Musculoskeletal:       Right shoulder: Normal. She exhibits no tenderness, no bony tenderness, no swelling, no effusion, no deformity, normal pulse and normal strength.       Left shoulder: She exhibits decreased range of motion, tenderness, bony tenderness and crepitus. She exhibits no swelling, no effusion and no deformity.  Neurological: She is alert and oriented to person, place, and time. No cranial nerve deficit.  Skin: Skin is warm and dry.  Psychiatric: She has a normal mood and affect. Her behavior is normal. Judgment and thought content normal.  Nursing note and vitals reviewed.    Assessment and Plan  1. Age-related osteoporosis without current pathological fracture Calcium and vitamin D dosing discussed with patient.  Start vitamin D prescription strength at this time.  Recheck levels in 3 months and adjust as needed. Discussed initiation of bisphosphonates.  Patient defers medication for osteoporosis at this time.  We did discuss her fracture risk, bone density, and possible treatments for osteoporosis. She reports that at this time she will consider treatment and call  back if she is interested.  She would also like to read on the bisphosphonates.  Risks versus benefits of therapy were discussed.  Mechanism of action of truck was discussed. - Vitamin D, Ergocalciferol, (DRISDOL) 50000 units CAPS capsule; Take 1 capsule (50,000 Units total) by mouth every 7 (seven) days.  Dispense: 12 capsule; Refill: 0  2. Chronic left shoulder pain Tylenol OTC as directed.  Do not recommend NSAID use secondary to CV risks.  Discussed risks of cardiovascular thrombotic events related to NSAIDS. Discussed increased risk of AMI and CVA. Discussed risk of serious GI adverse events including bleeding, ulcers, and perforation. Patient understands risks of this medications and defers at this time.   - Ambulatory referral to Orthopedic Surgery  3. Vitamin D deficiency - VITAMIN D 25 Hydroxy (Vit-D Deficiency, Fractures) OV was 45 minutes. Greater than 50% spent counseling and coordinating care.  Return in about 6 months (around 07/31/2018). [name], MD 01/29/2018

## 2018-01-29 NOTE — Patient Instructions (Addendum)
Astroglyde  Calcium 600 mg tablet, twice a day Use the vitamin D prescription for the next 3 months We will recheck your levels at your follow up Fosamax Boniva Return to lab in 3 months for Vitamin D check. This test has been ordered.   Preventing Osteoporosis, Adult Osteoporosis is a condition that causes the bones to get weaker. With osteoporosis, the bones become thinner, and the normal spaces in bone tissue become larger. This can make the bones weak and cause them to break more easily. People who have osteoporosis are more likely to break their wrist, spine, or hip. Even a minor accident or injury can be enough to break weak bones. Osteoporosis can occur with aging. Your body constantly replaces old bone tissue with new tissue. As you get older, you may lose bone tissue more quickly, or it may be replaced more slowly. Osteoporosis is more likely to develop if you have poor nutrition or do not get enough calcium or vitamin D. Other lifestyle factors can also play a role. By making some diet and lifestyle changes, you can help to keep your bones healthy and help to prevent osteoporosis. What nutrition changes can be made? Nutrition plays an important role in maintaining healthy, strong bones.  Make sure you get enough calcium every day from food or from calcium supplements. ? If you are age 78 or younger, aim to get 1,000 mg of calcium every day. ? If you are older than age 78, aim to get 1,200 mg of calcium every day.  Try to get enough vitamin D every day. ? If you are age 78 or younger, aim to get 600 international units (IU) every day. ? If you are older than age 78, aim to get 800 international units every day.  Follow a healthy diet. Eat plenty of foods that contain calcium and vitamin D. ? Calcium is in milk, cheese, yogurt, and other dairy products. Some fish and vegetables are also good sources of calcium. Many foods such as cereals and breads have had calcium added to them (are  fortified). Check nutrition labels to see how much calcium is in a food or drink. ? Foods that contain vitamin D include milk, cereals, salmon, and tuna. Your body also makes vitamin D when you are out in the sun. Bare skin exposure to the sun on your face, arms, legs, or back for no more than 30 minutes a day, 2 times per week is more than enough. Beyond that, it is important to use sunblock to protect your skin from sunburn, which increases your risk for skin cancer.  What lifestyle changes can be made? Making changes in your everyday life can also play an important role in preventing osteoporosis.  Stay active and get exercise every day. Ask your health care provider what types of exercise are best for you.  Do not use any products that contain nicotine or tobacco, such as cigarettes and e-cigarettes. If you need help quitting, ask your health care provider.  Limit alcohol intake to no more than 1 drink a day for nonpregnant women and 2 drinks a day for men. One drink equals 12 oz of beer, 5 oz of wine, or 1 oz of hard liquor.  Why are these changes important? Making these nutrition and lifestyle changes can:  Help you develop and maintain healthy, strong bones.  Prevent loss of bone mass and the problems that are caused by that loss, such as broken bones and delayed healing.  Make you  feel better mentally and physically.  What can happen if changes are not made? Problems that can result from osteoporosis can be very serious. These may include:  A higher risk of broken bones that are painful and do not heal well.  Physical malformations, such as a collapsed spine or a hunched back.  Problems with movement.  Where to find support: If you need help making changes to prevent osteoporosis, talk with your health care provider. You can ask for a referral to a diet and nutrition specialist (dietitian) and a physical therapist. Where to find more information: Learn more about  osteoporosis from:  NIH Osteoporosis and Related [place]: www.niams.GolfingGoddess.com.br  U.S. Office on Women's Health: SouvenirBaseball.es.html  National Osteoporosis Foundation: ProfilePeek.ch  Summary  Osteoporosis is a condition that causes weak bones that are more likely to break.  Eating a healthy diet and making sure you get enough calcium and vitamin D can help prevent osteoporosis.  Other ways to reduce your risk of osteoporosis include getting regular exercise and avoiding alcohol and products that contain nicotine or tobacco. This information is not intended to replace advice given to you by your health care provider. Make sure you discuss any questions you have with your health care provider. Document Released: 10/09/2015 Document Revised: 06/04/2016 Document Reviewed: 06/04/2016 Elsevier Interactive Patient Education  2018 [employer].  Osteoporosis Osteoporosis happens when your bones become thinner and weaker. Weak bones can break (fracture) more easily when you slip or fall. Bones most at risk of breaking are in the hip, wrist, and spine. Follow these instructions at home:  Get enough calcium and vitamin D. These nutrients are good for your bones.  Exercise as told by your doctor.  Do not use any tobacco products. This includes cigarettes, chewing tobacco, and electronic cigarettes. If you need help quitting, ask your doctor.  Limit the amount of alcohol you drink.  Take medicines only as told by your doctor.  Keep all follow-up visits as told by your doctor. This is important.  Take care at home to prevent falls. Some ways to do this are: ? Keep rooms well lit and tidy. ? Put safety rails on your stairs. ? Put a rubber mat in the bathroom and other places that are often wet or slippery. Get help right away  if:  You fall.  You hurt yourself. This information is not intended to replace advice given to you by your health care provider. Make sure you discuss any questions you have with your health care provider. Document Released: 12/17/2011 Document Revised: 03/01/2016 Document Reviewed: 03/04/2014 Elsevier Interactive Patient Education  [employer].

## 2018-01-31 NOTE — Progress Notes (Signed)
Primary Care Physician:  [name], MD  Primary GI: Dr. Gala Romney   Chief Complaint  Patient presents with  . Diverticulitis    feeling better; wants to discuss if surgery is needed    HPI:   Dorothy Lee is a 77 y.o. female presenting today with a history of constipation, diverticulitis, GERD. She was seen in Jan 2019 with predominant urinary symptoms and treated for UTI. Due for surveillance colonoscopy in 2020.   She was admitted in interim from last appt in Jan 2019 with perforated diverticulitis. She recently saw Dr. Arnoldo Morale in Feb 2019 and doing well. Plans for elective surgery in the future.  She has a few questions today regarding surgery. She will be going to [place] soon with her friends for a long weekend. She is curious about dietary intake, as well. She has no abdominal pain, N/V, constipation, diarrhea, or rectal bleeding. She is feeling great. Husband is with her today but in waiting room. 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   Past Medical History:  Diagnosis Date  . Allergic rhinitis   . CAD (coronary artery disease)    DES RCA and LAD 02/2008, DES mid LAD 11/2012  . Cancer (HCC)    Rt Breast  . Cough   . Diverticulitis   . Diverticulosis   . Essential hypertension   . GERD (gastroesophageal reflux disease)   . Glaucoma   . History of breast cancer    Right - s/p XRT 1984, surgery, no chemo  . Hyperlipidemia   . Personal history of radiation therapy   . Rotator cuff syndrome, left   . Scoliosis   . Urinary incontinence     Past Surgical History:  Procedure Laterality Date  . APPENDECTOMY  1981  . BREAST BIOPSY  1984  . BREAST LUMPECTOMY  1982   right breast  . COLONOSCOPY  2008   Dr. Oletta Lamas: diverticulosis, hemorrhoids, conscious sedation  . COLONOSCOPY N/A 01/30/2016   Dr. Gala Romney: mild divertiuculosis in sigmoid colon. narrowing of the colon in association with the deverticular opening. 6 polyps removed, tubular adenoma. next TCS in 3 years    . CORONARY ANGIOPLASTY WITH STENT PLACEMENT  02/2008  . ESOPHAGOGASTRODUODENOSCOPY  2014   Dr. Fuller Plan: erosive gastritis, no h.pylori  . ESOPHAGOGASTRODUODENOSCOPY N/A 01/30/2016   Dr. Gala Romney: normal esophagus s/p dilation, small hh.  Marland Kitchen LEFT HEART CATHETERIZATION WITH CORONARY ANGIOGRAM N/A 12/02/2012   Procedure: LEFT HEART CATHETERIZATION WITH CORONARY ANGIOGRAM;  Surgeon: Jettie Booze, MD;  Location: [facility] CATH LAB;  Service: Cardiovascular;  Laterality: N/A;  . Venia Minks DILATION N/A 01/30/2016   Procedure: Venia Minks DILATION;  Surgeon: Daneil Dolin, MD;  Location: [facility] ENDO SUITE;  Service: Endoscopy;  Laterality: N/A;  . NASAL SINUS SURGERY    . PERCUTANEOUS CORONARY STENT INTERVENTION (PCI-S)  12/02/2012   Procedure: PERCUTANEOUS CORONARY STENT INTERVENTION (PCI-S);  Surgeon: Jettie Booze, MD;  Location[facility] CATH LAB;  Service: Cardiovascular;;  DES to the MID LAD    Current Outpatient Medications  Medication Sig Dispense Refill  . aspirin EC 81 MG tablet Take 81 mg by mouth every morning.    . isosorbide mononitrate (IMDUR) 30 MG 24 hr tablet Take 1 tablet (30 mg total) by mouth daily. 90 tablet 0  . latanoprost (XALATAN) 0.005 % ophthalmic solution Place 2 drops into both eyes at bedtime.    . metoprolol succinate (TOPROL-XL) 25 MG 24 hr tablet Take 1 tablet (25 mg total) by mouth  daily. 30 tablet 1  . nitroGLYCERIN (NITROSTAT) 0.4 MG SL tablet Place 0.4 mg under the tongue every 5 (five) minutes x 3 doses as needed for chest pain. May repeat x3    . pantoprazole (PROTONIX) 40 MG tablet Take 40 mg by mouth daily.    . rosuvastatin (CRESTOR) 10 MG tablet Take 1 tablet (10 mg total) by mouth daily. OVERDUE FOR FOLLOW UP. CALL AND SCHEDULE [phone] 30 tablet 0  . Vitamin D, Ergocalciferol, (DRISDOL) 50000 units CAPS capsule Take 1 capsule (50,000 Units total) by mouth every 7 (seven) days. 12 capsule 0   No current facility-administered medications for this visit.     Allergies  as of 01/28/2018 - Review Complete 01/28/2018  Allergen Reaction Noted  . Crestor [rosuvastatin]  08/11/2013  . Lipitor [atorvastatin]  08/11/2013  . Lisinopril Cough 02/16/2013  . Simvastatin  08/11/2013    Family History  Problem Relation Age of Onset  . Emphysema Father   . Emphysema Brother   . Heart disease Brother        x3 brothers  . Lung cancer Brother   . Throat cancer Brother        head/neck cancer  . Heart attack Brother   . Heart attack Sister   . Heart attack Son        blood clot  . Colon cancer Neg Hx     Social History   Socioeconomic History  . Marital status: Married    Spouse name: Not on file  . Number of children: 1  . Years of education: Not on file  . Highest education level: Not on file  Occupational History  . Occupation: Probation officer  . Occupation: COSMETOLOGY    Employer: [employer]  . Financial resource strain: Not on file  . Food insecurity:    Worry: Not on file    Inability: Not on file  . Transportation needs:    Medical: Not on file    Non-medical: Not on file  Tobacco Use  . Smoking status: Never Smoker  . Smokeless tobacco: Never Used  . Tobacco comment: Widowed, lives alone but has sig other.   Substance and Sexual Activity  . Alcohol use: Not Currently  . Drug use: No  . Sexual activity: Yes    Birth control/protection: Post-menopausal  Lifestyle  . Physical activity:    Days per week: Not on file    Minutes per session: Not on file  . Stress: Not on file  Relationships  . Social connections:    Talks on phone: Not on file    Gets together: Not on file    Attends religious service: Not on file    Active member of club or organization: Not on file    Attends meetings of clubs or organizations: Not on file    Relationship status: Not on file  Other Topics Concern  . Not on file  Social History Narrative   Widowed, husband died.    Lives alone but has significant other, married in January 24, 2016 to  [place].    Had one son, but he passed away.    Retired as Probation officer - [employer] and part-time at [employer]   Son died in 2016/01/24 of AMI. Has two grandsons.        Review of Systems: Gen: Denies fever, chills, anorexia. Denies fatigue, weakness, weight loss.  CV: Denies chest pain, palpitations, syncope, peripheral edema, and claudication. Resp:  Denies dyspnea at rest, cough, wheezing, coughing up blood, and pleurisy. GI: see HPI  Derm: Denies rash, itching, dry skin Psych: Denies depression, anxiety, memory loss, confusion. No homicidal or suicidal ideation.  Heme: Denies bruising, bleeding, and enlarged lymph nodes.  Physical Exam: BP (!) 164/76   Pulse 80   Temp (!) 97 F (36.1 C) (Oral)   Ht 5\' 2"  (1.575 m)   Wt 139 lb (63 kg)   BMI 25.42 kg/m  General:   Alert and oriented. No distress noted. Pleasant and cooperative.  Head:  Normocephalic and atraumatic. Eyes:  Conjuctiva clear without scleral icterus. Mouth:  Oral mucosa pink and moist.  Abdomen:  +BS, soft, non-tender and non-distended. No rebound or guarding. No HSM or masses noted. Msk:  Symmetrical without gross deformities. Normal posture. Extremities:  Without edema. Neurologic:  Alert and  oriented x4 Psych:  Alert and cooperative. Normal mood and affect.

## 2018-01-31 NOTE — Assessment & Plan Note (Signed)
77 year old female with recent hospitalization for diverticulitis with perforation. Managed clinically. She will be undergoing elective surgery with Dr. Arnoldo Morale in the future. Doing well from a GI standpoint. Multiple questions answered. Return in Jan 2020 to arrange surveillance colonoscopy due to history of multiple polyps.

## 2018-02-03 NOTE — Progress Notes (Signed)
cc'ed to pcp °

## 2018-02-05 ENCOUNTER — Ambulatory Visit (INDEPENDENT_AMBULATORY_CARE_PROVIDER_SITE_OTHER): Payer: Medicare Other | Admitting: Orthopaedic Surgery

## 2018-02-05 ENCOUNTER — Ambulatory Visit (INDEPENDENT_AMBULATORY_CARE_PROVIDER_SITE_OTHER): Payer: Medicare Other

## 2018-02-05 ENCOUNTER — Encounter (INDEPENDENT_AMBULATORY_CARE_PROVIDER_SITE_OTHER): Payer: Self-pay | Admitting: Orthopaedic Surgery

## 2018-02-05 VITALS — BP 124/71 | HR 77 | Resp 16 | Ht 63.0 in | Wt 137.0 lb

## 2018-02-05 DIAGNOSIS — M25551 Pain in right hip: Secondary | ICD-10-CM

## 2018-02-05 DIAGNOSIS — M5441 Lumbago with sciatica, right side: Secondary | ICD-10-CM | POA: Diagnosis not present

## 2018-02-05 DIAGNOSIS — M25512 Pain in left shoulder: Secondary | ICD-10-CM | POA: Diagnosis not present

## 2018-02-05 DIAGNOSIS — I25119 Atherosclerotic heart disease of native coronary artery with unspecified angina pectoris: Secondary | ICD-10-CM

## 2018-02-05 DIAGNOSIS — G8929 Other chronic pain: Secondary | ICD-10-CM

## 2018-02-05 NOTE — Progress Notes (Signed)
Office Visit Note   Patient: Dorothy Lee           Date of Birth: 1941-05-23           MRN: [id number] Visit Date: 02/05/2018              Requested by: [name], [address], [place] [id number] PCP: [name], MD   Assessment & Plan: Visit Diagnoses:  1. Chronic right-sided low back pain with right-sided sciatica   2. Pain of right hip joint   3. Chronic left shoulder pain     Plan: Long discussion over 45 minutes regarding all the above issues.  She has primary osteoarthritis of her left shoulder.  Minimal degenerative changes about the right hip.  I believe the right hip pain is referred from her back.  We will order an MRI scan of the lumbar spine.  Try Medrol Dosepak  Follow-Up Instructions: No follow-ups on file.   Orders:  Orders Placed This Encounter  Procedures  . XR Pelvis 1-2 Views  . XR Lumbar Spine 2-3 Views  . XR Shoulder Left   No orders of the defined types were placed in this encounter.     Procedures: No procedures performed   Clinical Data: No additional findings.   Subjective: Chief Complaint  Patient presents with  . Right Hip - Pain  . Lower Back - Pain  . Left Shoulder - Pain  . Follow-up    LOW BACK PAIN INTO RIGHT HIP FOR 1 YR HIP CATCHES AT TIMES. L SHOULDER PAIN INTO NECK & DOWN ARM  [name] visit to the office for evaluation of several problems.  She has had issues with her low back right hip and groin pain and left shoulder discomfort.  She does not remember any history of injury or trauma.  She is planning a trip with some friends to [place] and is concerned about her ability to walk and participate in the activities Pain in each of the locations was insidious in onset approximately a year ago.  She has had some pain along the right side of her back referred to her buttock and then across into her right groin.  He does have a limp when she first gets up from a sitting position.  She also has pain  when she sits for long period of time.  Apparently does not have much trouble sleeping.  She will have a limp she has not had any pain distal to her right thigh.  She does not have any left leg pain.  There is been no numbness or tingling.  She also had insidious onset of left shoulder pain which now is reached a point where she is having some difficulty raising her left arm over her head and behind her neck.  I am seems to be a little bit worse particularly with overhead activity and was sleeping and will radiate to the left side of her neck and then to the mid arm.  No numbness or tingling  HPI  Review of Systems  Constitutional: Negative for fever.  HENT: Negative for ear pain.   Eyes: Negative for pain.  Respiratory: Negative for cough and shortness of breath.   Cardiovascular: Negative for leg swelling.  Gastrointestinal: Negative for constipation and diarrhea.  Genitourinary: Negative for difficulty urinating.  Musculoskeletal: Positive for back pain and neck pain.  Skin: Negative for rash.  Allergic/Immunologic: Negative for food allergies.  Hematological: Does  not bruise/bleed easily.  Psychiatric/Behavioral: Positive for sleep disturbance.     Objective: Vital Signs: BP 124/71 (BP Location: Left Arm, Patient Position: Sitting, Cuff Size: Normal)   Pulse 77   Resp 16   Ht 5\' 3"  (1.6 m)   Wt 137 lb (62.1 kg)   BMI 24.27 kg/m   Physical Exam  Constitutional: She is oriented to person, place, and time. She appears well-developed and well-nourished.  HENT:  Mouth/Throat: Oropharynx is clear and moist.  Eyes: Pupils are equal, round, and reactive to light. EOM are normal.  Pulmonary/Chest: Effort normal.  Neurological: She is alert and oriented to person, place, and time.  Skin: Skin is warm and dry.  Psychiatric: She has a normal mood and affect. Her behavior is normal.    Ortho Exam awake alert and oriented x3.  Comfortable sitting.  Some difficulty getting up from a  sitting position with pain in her right buttock referred to her right groin.  She has some start up pain with discomfort in her right groin.  No distal edema.  No pain in either knee.  Good pulses.  Good capillary refill to both feet.  Pain on extremes of internal and external rotation right hip compared to the left.  No localized areas of tenderness.  No back pain.  Left shoulder with positive impingement and minimally positive empty can testing.  Did lack about 20 to 25 degrees of full overhead motion.  Good strength.  Biceps intact.  No neck pain or motion.  Good grip and good release.  Good strength.  Specialty Comments:  No specialty comments available.  Imaging: No results found.   PMFS History: Patient Active Problem List   Diagnosis Date Noted  . Dyspareunia in female 01/24/2018  . Diverticulitis of large intestine with perforation without abscess or bleeding   . Diverticulitis 11/17/2017  . Suprapubic abdominal pain 10/22/2017  . Lower urinary tract symptoms (LUTS) 10/22/2017  . Constipation 04/24/2017  . Fatty liver 04/24/2017  . Solitary pulmonary nodule 10/05/2016  . LLQ pain 08/10/2016  . History of colonic polyps   . Diverticulosis of colon without hemorrhage   . Dysphagia   . Esophageal dysphagia 01/05/2016  . Acute diverticulitis 01/05/2016  . Postoperative breast asymmetry 06/02/2015  . Cough variant asthma 01/19/2014  . Weight loss, unintentional 09/29/2013  . Upper airway cough syndrome 11/20/2010  . Chronic sinusitis 11/08/2010  . ALLERGIC RHINITIS 11/08/2010  . PALPITATIONS, HX OF 10/05/2010  . ROTATOR CUFF SYNDROME, LEFT 09/25/2010  . GERD 09/22/2010  . URINARY INCONTINENCE 09/22/2010  . Dyslipidemia 09/15/2010  . Essential hypertension 09/15/2010  . Coronary atherosclerosis 09/15/2010  . BREAST CANCER, HX OF 09/15/2010   Past Medical History:  Diagnosis Date  . Allergic rhinitis   . CAD (coronary artery disease)    DES RCA and LAD 02/2008, DES mid  LAD 11/2012  . Cancer (HCC)    Rt Breast  . Cough   . Diverticulitis   . Diverticulosis   . Essential hypertension   . GERD (gastroesophageal reflux disease)   . Glaucoma   . History of breast cancer    Right - s/p XRT 1984, surgery, no chemo  . Hyperlipidemia   . Personal history of radiation therapy   . Rotator cuff syndrome, left   . Scoliosis   . Urinary incontinence     Family History  Problem Relation Age of Onset  . Emphysema Father   . Emphysema Brother   . Heart disease Brother  x3 brothers  . Lung cancer Brother   . Throat cancer Brother        head/neck cancer  . Heart attack Brother   . Heart attack Sister   . Heart attack Son        blood clot  . Colon cancer Neg Hx     Past Surgical History:  Procedure Laterality Date  . APPENDECTOMY  1981  . BREAST BIOPSY  1984  . BREAST LUMPECTOMY  1982   right breast  . COLONOSCOPY  2008   Dr. Oletta Lamas: diverticulosis, hemorrhoids, conscious sedation  . COLONOSCOPY N/A 01/30/2016   Dr. Gala Romney: mild divertiuculosis in sigmoid colon. narrowing of the colon in association with the deverticular opening. 6 polyps removed, tubular adenoma. next TCS in 3 years  . CORONARY ANGIOPLASTY WITH STENT PLACEMENT  02/2008  . ESOPHAGOGASTRODUODENOSCOPY  2014   Dr. Fuller Plan: erosive gastritis, no h.pylori  . ESOPHAGOGASTRODUODENOSCOPY N/A 01/30/2016   Dr. Gala Romney: normal esophagus s/p dilation, small hh.  Marland Kitchen LEFT HEART CATHETERIZATION WITH CORONARY ANGIOGRAM N/A 12/02/2012   Procedure: LEFT HEART CATHETERIZATION WITH CORONARY ANGIOGRAM;  Surgeon: Jettie Booze, MD;  Location: [facility] CATH LAB;  Service: Cardiovascular;  Laterality: N/A;  . Venia Minks DILATION N/A 01/30/2016   Procedure: Venia Minks DILATION;  Surgeon: Daneil Dolin, MD;  Location: [facility] ENDO SUITE;  Service: Endoscopy;  Laterality: N/A;  . NASAL SINUS SURGERY    . PERCUTANEOUS CORONARY STENT INTERVENTION (PCI-S)  12/02/2012   Procedure: PERCUTANEOUS CORONARY STENT INTERVENTION  (PCI-S);  Surgeon: Jettie Booze, MD;  Location: [facility] CATH LAB;  Service: Cardiovascular;;  DES to the MID LAD   Social History   Occupational History  . Occupation: Probation officer  . Occupation: COSMETOLOGY    Employer: [employer]  Tobacco Use  . Smoking status: Never Smoker  . Smokeless tobacco: Never Used  . Tobacco comment: Widowed, lives alone but has sig other.   Substance and Sexual Activity  . Alcohol use: Not Currently  . Drug use: No  . Sexual activity: Yes    Birth control/protection: Post-menopausal

## 2018-02-06 ENCOUNTER — Other Ambulatory Visit (INDEPENDENT_AMBULATORY_CARE_PROVIDER_SITE_OTHER): Payer: Self-pay | Admitting: Radiology

## 2018-02-06 DIAGNOSIS — M545 Low back pain: Secondary | ICD-10-CM

## 2018-02-07 ENCOUNTER — Other Ambulatory Visit (INDEPENDENT_AMBULATORY_CARE_PROVIDER_SITE_OTHER): Payer: Self-pay | Admitting: Radiology

## 2018-02-11 NOTE — Progress Notes (Signed)
Cardiology Office Note  Date: 02/14/2018   ID: Dorothy Lee, DOB 04-Feb-1941, MRN [id number]  PCP: [name], MD  Primary Cardiologist: [name], MD   Chief Complaint  Patient presents with  . Coronary Artery Disease    History of Present Illness: Dorothy Lee is a 77 y.o. female last seen in January.  She presents for a routine follow-up visit.  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  She does not report any angina symptoms at this time to require nitroglycerin. Imdur was added subsequent to the last visit and she feels like this is been beneficial.  Follow-up Lexiscan Myoview in January was low risk as outlined below.  Additional cardiac medications include aspirin, Toprol-XL, and Crestor.  She follows with Dr. Mannie Stabile for primary care.  Past Medical History:  Diagnosis Date  . Allergic rhinitis   . CAD (coronary artery disease)    DES RCA and LAD 02/2008, DES mid LAD 11/2012  . Cancer (HCC)    Rt Breast  . Cough   . Diverticulitis   . Diverticulosis   . Essential hypertension   . GERD (gastroesophageal reflux disease)   . Glaucoma   . History of breast cancer    Right - s/p XRT 1984, surgery, no chemo  . Hyperlipidemia   . Personal history of radiation therapy   . Rotator cuff syndrome, left   . Scoliosis   . Urinary incontinence     Past Surgical History:  Procedure Laterality Date  . APPENDECTOMY  1981  . BREAST BIOPSY  1984  . BREAST LUMPECTOMY  1982   right breast  . COLONOSCOPY  2008   Dr. Oletta Lamas: diverticulosis, hemorrhoids, conscious sedation  . COLONOSCOPY N/A 01/30/2016   Dr. Gala Romney: mild divertiuculosis in sigmoid colon. narrowing of the colon in association with the deverticular opening. 6 polyps removed, tubular adenoma. next TCS in 3 years  . CORONARY ANGIOPLASTY WITH STENT PLACEMENT  02/2008  . ESOPHAGOGASTRODUODENOSCOPY  2014   Dr. Fuller Plan: erosive gastritis, no h.pylori  . ESOPHAGOGASTRODUODENOSCOPY N/A  01/30/2016   Dr. Gala Romney: normal esophagus s/p dilation, small hh.  Marland Kitchen LEFT HEART CATHETERIZATION WITH CORONARY ANGIOGRAM N/A 12/02/2012   Procedure: LEFT HEART CATHETERIZATION WITH CORONARY ANGIOGRAM;  Surgeon: Jettie Booze, MD;  Location: [facility] CATH LAB;  Service: Cardiovascular;  Laterality: N/A;  . Venia Minks DILATION N/A 01/30/2016   Procedure: Venia Minks DILATION;  Surgeon: Daneil Dolin, MD;  Location[facility]O [facility];  Service: Endoscopy;  Laterality: N/A;  . NASAL SINUS SURGERY    . PERCUTANEOUS CORONARY STENT INTERVENTION (PCI-S)  12/02/2012   Procedure: PERCUTANEOUS CORONARY STENT INTERVENTION (PCI-S);  Surgeon: Jettie Booze, MD;  Location: [facility] CATH LAB;  Service: Cardiovascular;;  DES to the MID LAD    Current Outpatient Medications  Medication Sig Dispense Refill  . aspirin EC 81 MG tablet Take 81 mg by mouth every morning.    . isosorbide mononitrate (IMDUR) 30 MG 24 hr tablet Take 1 tablet (30 mg total) by mouth daily. 90 tablet 0  . latanoprost (XALATAN) 0.005 % ophthalmic solution Place 2 drops into both eyes at bedtime.    . metoprolol succinate (TOPROL-XL) 25 MG 24 hr tablet Take 1 tablet (25 mg total) by mouth daily. 30 tablet 1  . nitroGLYCERIN (NITROSTAT) 0.4 MG SL tablet Place 0.4 mg under the tongue every 5 (five) minutes x 3 doses as needed for chest pain. May repeat x3    . pantoprazole (  PROTONIX) 40 MG tablet Take 40 mg by mouth daily.    . rosuvastatin (CRESTOR) 10 MG tablet Take 1 tablet (10 mg total) by mouth daily. OVERDUE FOR FOLLOW UP. CALL AND SCHEDULE [phone] 30 tablet 0  . Vitamin D, Ergocalciferol, (DRISDOL) 50000 units CAPS capsule Take 1 capsule (50,000 Units total) by mouth every 7 (seven) days. 12 capsule 0   No current facility-administered medications for this visit.    Allergies:  Crestor [rosuvastatin]; Lipitor [atorvastatin]; Lisinopril; and Simvastatin   Social History: The patient  reports that she has never smoked. She has never used  smokeless tobacco. She reports that she drank alcohol. She reports that she does not use drugs.   ROS:  Please see the history of present illness. Otherwise, complete review of systems is positive for none.  All other systems are reviewed and negative.   Physical Exam: VS:  BP 128/70 (BP Location: Left Arm)   Pulse 81   Ht 5\' 3"  (1.6 m)   Wt 138 lb (62.6 kg)   SpO2 97%   BMI 24.45 kg/m , BMI Body mass index is 24.45 kg/m.  Wt Readings from Last 3 Encounters:  02/14/18 138 lb (62.6 kg)  02/05/18 137 lb (62.1 kg)  01/29/18 139 lb (63 kg)    General: Patient appears comfortable at rest. HEENT: Conjunctiva and lids normal, oropharynx clear. Neck: Supple, no elevated JVP or carotid bruits, no thyromegaly. Lungs: Clear to auscultation, nonlabored breathing at rest. Cardiac: Regular rate and rhythm, no S3 or significant systolic murmur, no pericardial rub. Abdomen: Soft, nontender, bowel sounds present. Extremities: No pitting edema, distal pulses 2+. Skin: Warm and dry. Musculoskeletal: No kyphosis. Neuropsychiatric: Alert and oriented x3, affect grossly appropriate.  ECG: I personally reviewed the tracing from 08/19/2014 which showed sinus rhythm with left atrial enlargement.  Recent Labwork: 12/09/2017: ALT 15; AST 24; BUN 12; Creat 0.79; Hemoglobin 12.6; Platelets 451; Potassium 4.0; Sodium 142; TSH 0.92     Component Value Date/Time   CHOL 173 12/09/2017 1212   TRIG 204 (H) 12/09/2017 1212   TRIG 119 05/16/2010   HDL 43 (L) 12/09/2017 1212   CHOLHDL 4.0 12/09/2017 1212   VLDL 16.4 08/19/2014 1453   LDLCALC 98 12/09/2017 1212   LDLDIRECT 144.4 11/09/2013 0934    Other Studies Reviewed Today:  Lexiscan Myoview 11/05/2017:  No diagnostic ST segment changes to indicate ischemia.  Small, mild intensity, mid to apical septal defect that is fixed, more prominent at rest and stress. Suggestive of soft tissue attenuation rather than scar in light of normal wall motion.  This  is a low risk study.  Nuclear stress EF: 72%.  Assessment and Plan:  1.  CAD status post DES to the RCA and LAD in 2009 with subsequent DES to the LAD in 2014.  She is stable at this time without progressive angina, tolerating Imdur well.  Stress testing from last year was low risk.  We will continue with observation for now.  2.  Essential hypertension, blood pressure is adequately controlled today.  No other changes made in medical regimen.  3.  Mixed hyperlipidemia, on Crestor.  Keep follow-up with PCP.  Recent LDL 98.  4.  GERD on Protonix.  No obvious GI bleeding.  Keep follow-up with PCP.  Current medicines were reviewed with the patient today.  Disposition: Follow-up in 6 months.  Signed, Satira Sark, MD, [facility] 02/14/2018 11:14 AM    [place] [facility] [address] [place], [place] [address] Phone: [phone]; Fax: [phone]

## 2018-02-14 ENCOUNTER — Encounter: Payer: Self-pay | Admitting: Cardiology

## 2018-02-14 ENCOUNTER — Ambulatory Visit (INDEPENDENT_AMBULATORY_CARE_PROVIDER_SITE_OTHER): Payer: Medicare Other | Admitting: Cardiology

## 2018-02-14 VITALS — BP 128/70 | HR 81 | Ht 63.0 in | Wt 138.0 lb

## 2018-02-14 DIAGNOSIS — E782 Mixed hyperlipidemia: Secondary | ICD-10-CM

## 2018-02-14 DIAGNOSIS — K219 Gastro-esophageal reflux disease without esophagitis: Secondary | ICD-10-CM

## 2018-02-14 DIAGNOSIS — I25119 Atherosclerotic heart disease of native coronary artery with unspecified angina pectoris: Secondary | ICD-10-CM

## 2018-02-14 DIAGNOSIS — I10 Essential (primary) hypertension: Secondary | ICD-10-CM

## 2018-02-14 MED ORDER — ISOSORBIDE MONONITRATE ER 30 MG PO TB24: 30.0000 mg | ORAL_TABLET | Freq: Every day | ORAL | 3 refills | Status: DC

## 2018-02-14 NOTE — Addendum Note (Signed)
Addended by: Barbarann Ehlers A on: 02/14/2018 11:17 AM   Modules accepted: Orders

## 2018-02-14 NOTE — Patient Instructions (Signed)
Your physician wants you to follow-up in:6 months with Dr.McDowell You will receive a reminder letter in the mail two months in advance. If you don't receive a letter, please call our office to schedule the follow-up appointment.   Your physician recommends that you continue on your current medications as directed. Please refer to the Current Medication list given to you today.   If you need a refill on your cardiac medications before your next appointment, please call your pharmacy.    No lab work or tests ordered today.      Thank you for choosing [facility] !         

## 2018-02-17 DIAGNOSIS — M7061 Trochanteric bursitis, right hip: Secondary | ICD-10-CM | POA: Diagnosis not present

## 2018-02-21 ENCOUNTER — Other Ambulatory Visit (INDEPENDENT_AMBULATORY_CARE_PROVIDER_SITE_OTHER): Payer: Self-pay | Admitting: Radiology

## 2018-02-21 MED ORDER — METHYLPREDNISOLONE 4 MG PO TABS: ORAL_TABLET | ORAL | 0 refills | Status: DC

## 2018-02-25 ENCOUNTER — Telehealth: Payer: Self-pay

## 2018-02-25 DIAGNOSIS — I10 Essential (primary) hypertension: Secondary | ICD-10-CM

## 2018-02-25 MED ORDER — METOPROLOL SUCCINATE ER 25 MG PO TB24: 25.0000 mg | ORAL_TABLET | Freq: Every day | ORAL | 1 refills | Status: DC

## 2018-02-25 NOTE — Telephone Encounter (Signed)
Patient called stating she had run out of her blood pressure medication and didn't know if she needed to continue it. She had gone to [place] for a girls trip and passed out. Ambulance was called. She said they checked her vitals, and did a CBG, and said everything was fine. Patient said she didn't go to the ED. She was feeling fine and felt like it was due to the heat. She states she has been feeling well since coming home. Sometimes in the morning when going from sitting to standing she may get a little dizzy but it goes away almost as soon as it passes. I advised patient to start changing positions slowly when she goes from lying to sitting and sitting to standing to do so slowly with verbal understanding. If she feels worse or if she is going to pass out she needs to see immediate attention at the nearest ED with verbal understanding. Told patient to make sure she is taking in plenty of fluids especially with the weather getting hotter. She verbalized understanding. Refill for Metoprolol sent in to [facility] with 1 refill. Patient told she needs to schedule follow up appt. Patient scheduled for follow up appt.

## 2018-02-26 NOTE — Telephone Encounter (Signed)
Spoke with patient and advised her of Dr.Hagler's recommendations with verbal understanding.  

## 2018-02-26 NOTE — Telephone Encounter (Signed)
Please advise patient to follow up. She needs to be monitoring some BP readings and HR readings at home. Buy cuff, Omron. Advise her that I would also like for her to schedule follow up with Dr. Domenic Polite. Please advise and see if she needs assistance in getting an appointment. Gwen Her. Mannie Stabile, MD

## 2018-03-12 DIAGNOSIS — H903 Sensorineural hearing loss, bilateral: Secondary | ICD-10-CM | POA: Diagnosis not present

## 2018-03-13 ENCOUNTER — Encounter: Payer: Self-pay | Admitting: Family Medicine

## 2018-03-14 ENCOUNTER — Encounter: Payer: Self-pay | Admitting: Family Medicine

## 2018-04-04 ENCOUNTER — Ambulatory Visit (INDEPENDENT_AMBULATORY_CARE_PROVIDER_SITE_OTHER): Payer: Medicare Other | Admitting: Family Medicine

## 2018-04-04 ENCOUNTER — Encounter: Payer: Self-pay | Admitting: Family Medicine

## 2018-04-04 ENCOUNTER — Other Ambulatory Visit: Payer: Self-pay

## 2018-04-04 ENCOUNTER — Telehealth: Payer: Self-pay | Admitting: Family Medicine

## 2018-04-04 VITALS — BP 138/70 | HR 87 | Temp 98.2°F | Resp 14 | Ht 63.0 in | Wt 139.1 lb

## 2018-04-04 DIAGNOSIS — E559 Vitamin D deficiency, unspecified: Secondary | ICD-10-CM | POA: Diagnosis not present

## 2018-04-04 DIAGNOSIS — I10 Essential (primary) hypertension: Secondary | ICD-10-CM | POA: Diagnosis not present

## 2018-04-04 DIAGNOSIS — M810 Age-related osteoporosis without current pathological fracture: Secondary | ICD-10-CM

## 2018-04-04 DIAGNOSIS — R1032 Left lower quadrant pain: Secondary | ICD-10-CM | POA: Diagnosis not present

## 2018-04-04 DIAGNOSIS — E538 Deficiency of other specified B group vitamins: Secondary | ICD-10-CM

## 2018-04-04 MED ORDER — CIPROFLOXACIN HCL 500 MG PO TABS: 500.0000 mg | ORAL_TABLET | Freq: Two times a day (BID) | ORAL | 0 refills | Status: DC

## 2018-04-04 MED ORDER — VITAMIN D (ERGOCALCIFEROL) 1.25 MG (50000 UNIT) PO CAPS: 50000.0000 [IU] | ORAL_CAPSULE | ORAL | 0 refills | Status: DC

## 2018-04-04 MED ORDER — METRONIDAZOLE 500 MG PO TABS: 500.0000 mg | ORAL_TABLET | Freq: Three times a day (TID) | ORAL | 0 refills | Status: DC

## 2018-04-04 NOTE — Progress Notes (Signed)
Patient ID: Dorothy Lee, female    DOB: 04-Oct-1941, 77 y.o.   MRN: [id number]  Chief Complaint  Patient presents with  . Abdominal Pain    Allergies Crestor [rosuvastatin]; Lipitor [atorvastatin]; Lisinopril; and Simvastatin  Subjective:   Dorothy Lee is a 77 y.o. female who presents to [facility] today.  HPI Here for follow up. For the past four days has been having lower abdominal discomfort consistent with her previous diverticulitis pain. Reports that she was supposed to follow up with Dr. Arnoldo Morale to discuss elective surgery for resection.  Reports that she has had pain in her lower left side.  Reports that it occurs during the day and in the morning and after bowel movement it feels a little bit better but then it comes back.  She reports that she previously has not had this pain on a daily basis.  She reports that is been going on for 4 days and is not gotten better.  She reports that she is concerned because she does not want to have a diverticulitis flare like she did when she was in the hospital.  CT scan from hospitalization in March was reviewed today.  She denies any fevers or chills.  She reports her appetite has been pretty good.  She is not having diarrhea.  In addition, she reports that she was in [place] about a month ago passed out.  She reports that the EMS came and all of her vitals were fine.  Reports that she had not had much to drink and so was dehydrated.  Reports that she was restricting her fluids b/c was trying not to drink much b/c did not want to have to go to the bathroom in [place]. It was very hot and had not had anything to drink. Patient reports that she could tell she was going to pass out and so she went up against the wall and she slid down the wall.  He denies any chest pain or palpitations preceding this.  She reports that she ate some food drink some liquids and proceeded to have a very good day.  She has not had any subsequent events.  She is  also here to discuss her bone density which did reveal osteoporosis in her hip.  She has a history of breast cancer.  She was on Evista for quite some time but is unsure of how many years.  She does not wish to be on Fosamax or receive any intravenous therapy for her bones.  She reports that she has not had any recent falls.  She takes her calcium and Vitamin D as directed.  Abdominal Pain  This is a recurrent problem. The current episode started in the past 7 days. The onset quality is gradual. The problem occurs daily. The most recent episode lasted 4 days. The problem has been waxing and waning. The pain is located in the LLQ. The pain is at a severity of 5/10. The pain is mild. The quality of the pain is cramping. Associated symptoms include constipation. Pertinent negatives include no anorexia, arthralgias, belching, diarrhea, dysuria, frequency, headaches, hematochezia, hematuria, myalgias, nausea or vomiting. Nothing aggravates the pain. The pain is relieved by bowel movements. She has tried nothing for the symptoms. The treatment provided no relief. There is no history of abdominal surgery, colon cancer, Crohn's disease, gallstones, GERD, irritable bowel syndrome, pancreatitis, PUD or ulcerative colitis.    Past Medical History:  Diagnosis Date  . Allergic rhinitis   .  CAD (coronary artery disease)    DES RCA and LAD 02/2008, DES mid LAD 11/2012  . Cancer (HCC)    Rt Breast  . Cough   . Diverticulitis   . Diverticulosis   . Essential hypertension   . GERD (gastroesophageal reflux disease)   . Glaucoma   . History of breast cancer    Right - s/p XRT 1984, surgery, no chemo  . Hyperlipidemia   . Personal history of radiation therapy   . Rotator cuff syndrome, left   . Scoliosis   . Urinary incontinence     Past Surgical History:  Procedure Laterality Date  . APPENDECTOMY  1981  . BREAST BIOPSY  1984  . BREAST LUMPECTOMY  1982   right breast  . COLONOSCOPY  2008   Dr. Oletta Lamas:  diverticulosis, hemorrhoids, conscious sedation  . COLONOSCOPY N/A 01/30/2016   Dr. Gala Romney: mild divertiuculosis in sigmoid colon. narrowing of the colon in association with the deverticular opening. 6 polyps removed, tubular adenoma. next TCS in 3 years  . CORONARY ANGIOPLASTY WITH STENT PLACEMENT  02/2008  . ESOPHAGOGASTRODUODENOSCOPY  2014   Dr. Fuller Plan: erosive gastritis, no h.pylori  . ESOPHAGOGASTRODUODENOSCOPY N/A 01/30/2016   Dr. Gala Romney: normal esophagus s/p dilation, small hh.  Marland Kitchen LEFT HEART CATHETERIZATION WITH CORONARY ANGIOGRAM N/A 12/02/2012   Procedure: LEFT HEART CATHETERIZATION WITH CORONARY ANGIOGRAM;  Surgeon: Jettie Booze, MD;  Location: [facility] CATH LAB;  Service: Cardiovascular;  Laterality: N/A;  . Venia Minks DILATION N/A 01/30/2016   Procedure: Venia Minks DILATION;  Surgeon: Daneil Dolin, MD;  Location: [facility] ENDO [facility];  Service: Endoscopy;  Laterality: N/A;  . NASAL SINUS SURGERY    . PERCUTANEOUS CORONARY STENT INTERVENTION (PCI-S)  12/02/2012   Procedure: PERCUTANEOUS CORONARY STENT INTERVENTION (PCI-S);  Surgeon: Jettie Booze, MD;  Location: [facility] CATH LAB;  Service: Cardiovascular;;  DES to the MID LAD    Family History  Problem Relation Age of Onset  . Emphysema Father   . Emphysema Brother   . Heart disease Brother        x3 brothers  . Lung cancer Brother   . Throat cancer Brother        head/neck cancer  . Heart attack Brother   . Heart attack Sister   . Heart attack Son        blood clot  . Colon cancer Neg Hx      Social History   Socioeconomic History  . Marital status: Married    Spouse name: Not on file  . Number of children: 1  . Years of education: Not on file  . Highest education level: Not on file  Occupational History  . Occupation: Probation officer  . Occupation: COSMETOLOGY    Employer: [employer]  . Financial resource strain: Not on file  . Food insecurity:    Worry: Not on file    Inability: Not on file  .  Transportation needs:    Medical: Not on file    Non-medical: Not on file  Tobacco Use  . Smoking status: Never Smoker  . Smokeless tobacco: Never Used  . Tobacco comment: Widowed, lives alone but has sig other.   Substance and Sexual Activity  . Alcohol use: Not Currently  . Drug use: No  . Sexual activity: Yes    Birth control/protection: Post-menopausal  Lifestyle  . Physical activity:    Days per week: Not on file    Minutes per session: Not on file  .  Stress: Not on file  Relationships  . Social connections:    Talks on phone: Not on file    Gets together: Not on file    Attends religious service: Not on file    Active member of club or organization: Not on file    Attends meetings of clubs or organizations: Not on file    Relationship status: Not on file  Other Topics Concern  . Not on file  Social History Narrative   Widowed, husband died.    Lives alone but has significant other, married in 2016/01/16 to [place].    Had one son, but he passed away.    Retired as Probation officer - Looking ahead [employer] and part-time at [employer]   Son died in 01/16/2016 of AMI. Has two grandsons.       Current Outpatient Medications on File Prior to Visit  Medication Sig Dispense Refill  . aspirin EC 81 MG tablet Take 81 mg by mouth every morning.    . isosorbide mononitrate (IMDUR) 30 MG 24 hr tablet Take 1 tablet (30 mg total) by mouth daily. 90 tablet 3  . latanoprost (XALATAN) 0.005 % ophthalmic solution Place 2 drops into both eyes at bedtime.    . metoprolol succinate (TOPROL-XL) 25 MG 24 hr tablet Take 1 tablet (25 mg total) by mouth daily. 30 tablet 1  . nitroGLYCERIN (NITROSTAT) 0.4 MG SL tablet Place 0.4 mg under the tongue every 5 (five) minutes x 3 doses as needed for chest pain. May repeat x3    . pantoprazole (PROTONIX) 40 MG tablet Take 40 mg by mouth daily.    . rosuvastatin (CRESTOR) 10 MG tablet Take 1 tablet (10 mg total) by mouth daily. OVERDUE FOR FOLLOW UP. CALL  AND SCHEDULE [phone] 30 tablet 0   No current facility-administered medications on file prior to visit.      Review of Systems  Constitutional: Negative for activity change, appetite change, chills and fatigue.  Eyes: Negative for visual disturbance.  Respiratory: Negative for cough, chest tightness and shortness of breath.   Cardiovascular: Negative for chest pain, palpitations and leg swelling.  Gastrointestinal: Positive for abdominal pain and constipation. Negative for abdominal distention, anal bleeding, anorexia, blood in stool, diarrhea, hematochezia, nausea, rectal pain and vomiting.  Genitourinary: Negative for dysuria, frequency, hematuria and urgency.  Musculoskeletal: Negative for arthralgias and myalgias.  Skin: Negative for rash.  Neurological: Negative for dizziness, syncope, light-headedness and headaches.  Hematological: Negative for adenopathy. Does not bruise/bleed easily.  Psychiatric/Behavioral: Negative for agitation, decreased concentration, sleep disturbance and suicidal ideas.     Objective:   BP 138/70 (BP Location: Right Arm, Patient Position: Sitting, Cuff Size: Normal)   Pulse 87   Temp 98.2 F (36.8 C) (Temporal)   Resp 14   Ht 5\' 3"  (1.6 m)   Wt 139 lb 1.3 oz (63.1 kg)   SpO2 96%   BMI 24.64 kg/m   Physical Exam  Constitutional: She is oriented to person, place, and time. She appears well-developed and well-nourished.  HENT:  Head: Normocephalic and atraumatic.  Eyes: Pupils are equal, round, and reactive to light. EOM are normal.  Neck: Normal range of motion. Neck supple.  Cardiovascular: Normal rate, regular rhythm, normal heart sounds and intact distal pulses.  Pulmonary/Chest: Effort normal and breath sounds normal. No respiratory distress.  Abdominal: Soft. Normal appearance and bowel sounds are normal. She exhibits no distension. There is no hepatosplenomegaly. There is tenderness in the left lower  quadrant. There is no rigidity, no  rebound, no guarding and no CVA tenderness.  Neurological: She is alert and oriented to person, place, and time.  Skin: Skin is warm and dry.  Psychiatric: She has a normal mood and affect. Her behavior is normal. Judgment and thought content normal.  Vitals reviewed.    Assessment and Plan  1. Age-related osteoporosis without current pathological fracture Discussion today regarding bisphosphonates oral, IV bisphosphonates, and other medications to treat osteoporosis.  Patient does not wish to try any of these medications.  She will continue her calcium and vitamin D.  Will recheck bone density in 2 years if patient wishes.  However we did discuss that if she is not wishing to take medications there would be no reason to recheck bone mineral density.  Exercise, stretching, and strengthening morning was recommended to decrease her fall risk.  2. Essential hypertension Stable.  Continue medications.  Follow-up with cardiology as directed. - BASIC METABOLIC PANEL WITH GFR  3. B12 deficiency Continue current supplementation.  4. Vitamin D deficiency  - Vitamin D, Ergocalciferol, (DRISDOL) 50000 units CAPS capsule; Take 1 capsule (50,000 Units total) by mouth every 7 (seven) days.  Dispense: 12 capsule; Refill: 0  5. Left lower quadrant pain Patient  with current left lower quadrant pain consistent with previous diverticulitis.  She has not followed up as previously instructed with Dr. Arnoldo Morale to discuss surgery and is now with a probable low-grade diverticular infection.  Thankfully, this is not progressed to the level that hit did when she was hospitalized in March.  She does have an upcoming appointment within next 2 weeks with Dr. Arnoldo Morale. Today I discussed with Dr. Cyndi Bender a RGA patient's symptoms.  We will go ahead and obtain CT scan of the abdomen and pelvis.  In addition we will start her on Cipro and Flagyl. Patient counseled in detail regarding the risks of medication. Told to call or  return to clinic if develop any worrisome signs or symptoms. Patient voiced understanding.  She was counseled concerning worrisome signs and symptoms of abdominal pain and if those develop she needs to go to the emergency department.  She was told if her pain worsens, she develops fever, nausea, vomiting, or any other worrisome signs that she needs to go to the emergency department.  We did discuss her history of perforated sigmoid diverticulitis in the past.  We discussed that this is an increased risk of sepsis and infection and could make her very sick.  She understands these risks and agrees to keep her scheduled follow-ups and call if she is having any problems. - ciprofloxacin (CIPRO) 500 MG tablet; Take 1 tablet (500 mg total) by mouth 2 (two) times daily.  Dispense: 14 tablet; Refill: 0 - metroNIDAZOLE (FLAGYL) 500 MG tablet; Take 1 tablet (500 mg total) by mouth 3 (three) times daily.  Dispense: 21 tablet; Refill: 0 - CT Abdomen Pelvis W Contrast; Future  Return in about 2 months (around 06/04/2018). [name], MD 04/04/2018

## 2018-04-04 NOTE — Telephone Encounter (Signed)
Patients husband called and said you told him to call the insurance company or billing department to change a code on the bill.  He said the TDap was not covered.  If you have a different code you want charged we must submit that to billing.  Let me know the code you want changed and the new code you want charged and I can make that request for you.  The billing department can not change a code unless it is requested by the Physician. He said the dos was 12/09/17.

## 2018-04-04 NOTE — Telephone Encounter (Signed)
Which you please check and see what was actually administered that day.  My note reveals that I ordered the pneumonia vaccination but this is not even on the patient's charge sheet and patient reveals that she was charged for a tetanus shot.  However in the immunization record reveals that a Td was administered and not a Tdap which is what her husband is saying.  Would you please help to sort this out?

## 2018-04-08 ENCOUNTER — Ambulatory Visit (INDEPENDENT_AMBULATORY_CARE_PROVIDER_SITE_OTHER): Payer: Medicare Other | Admitting: General Surgery

## 2018-04-08 ENCOUNTER — Encounter: Payer: Self-pay | Admitting: General Surgery

## 2018-04-08 ENCOUNTER — Telehealth: Payer: Self-pay | Admitting: Family Medicine

## 2018-04-08 VITALS — BP 154/78 | HR 74 | Temp 96.8°F | Resp 16 | Wt 139.0 lb

## 2018-04-08 DIAGNOSIS — K5792 Diverticulitis of intestine, part unspecified, without perforation or abscess without bleeding: Secondary | ICD-10-CM | POA: Diagnosis not present

## 2018-04-08 DIAGNOSIS — I25119 Atherosclerotic heart disease of native coronary artery with unspecified angina pectoris: Secondary | ICD-10-CM | POA: Diagnosis not present

## 2018-04-08 NOTE — Telephone Encounter (Signed)
Called patient to let them know we have submitted the request for the [facility] 12/09/17 claim be resubmitted with additional dx codes.  We hope medicare will pay this not.  Patient Dorothy Lee DOB 10-11-1940  Please add DX C80.1, I25.10 and Z23 to codes [phone] G009 DOS 12/09/17 and refile per Dr. Mannie Stabile.  Thanks [facility]

## 2018-04-08 NOTE — Progress Notes (Signed)
Subjective:     Dorothy Lee  [place].  She has had several episodes of left lower quadrant abdominal pain, the last one occurring 4 days ago.  She was seen by her primary care physician and started on ciprofloxacin and Flagyl.  The left lower quadrant abdominal pain has since eased.  It is similar to her previous episode of sigmoid diverticulitis.  She denies any fever or chills currently.  Her pain is 2 out of 10.  She is currently taking those antibiotics. Objective:    BP (!) 154/78 (BP Location: Left Arm, Patient Position: Sitting, Cuff Size: Normal)   Pulse 74   Temp (!) 96.8 F (36 C) (Temporal)   Resp 16   Wt 139 lb (63 kg)   BMI 24.62 kg/m   General:  alert, cooperative and no distress  Abdomen is soft with minimal tenderness to deep palpation in the left lower quadrant.  No rigidity is noted.  Bowel sounds are present.     Assessment:    Recurrent sigmoid diverticulitis    Plan:  Patient does realize that we need to proceed with an elective partial colectomy.  Due to her recent episode of diverticulitis, will wait 4 to 6 weeks to schedule surgery.  She understands this.  She is was instructed to finish her antibiotic course.  She will follow-up on 04/06/2018.

## 2018-04-15 DIAGNOSIS — H401132 Primary open-angle glaucoma, bilateral, moderate stage: Secondary | ICD-10-CM | POA: Diagnosis not present

## 2018-04-23 ENCOUNTER — Ambulatory Visit (HOSPITAL_COMMUNITY): Payer: Medicare Other

## 2018-04-28 ENCOUNTER — Telehealth: Payer: Self-pay | Admitting: Family Medicine

## 2018-04-28 NOTE — Telephone Encounter (Signed)
Patient is requesting for you to call her. She has an upcoming surgery & has a lot of questions. Cb#: [id number]/ [phone]

## 2018-04-29 NOTE — Telephone Encounter (Signed)
Patient is to be seen and evaluated if she is having dizzy symptoms and not feeling well.  She needs to come in for an evaluation, go to the urgent care, or go to the emergency department.  This needs to be evaluated by medical personnel.

## 2018-04-29 NOTE — Telephone Encounter (Signed)
Patient has been having a lot of headaches and been feeling dizzy and "out of whack" and the other day would have passed out if she hadn't sat down. Feels dizzy when she bends over. Concerned about have surgery with these symptoms. Dr.Jenkins is performing colon surgery. I advised the patient to discuss these symptoms with Dr.Jenkins on Friday when she has an appointment with him because he is the one that will performing the surgery.

## 2018-04-29 NOTE — Telephone Encounter (Signed)
Please call and see what she needs. The questions regarding the upcoming surgery would be best answered by the surgeon. Gwen Her. Mannie Stabile, MD

## 2018-04-30 NOTE — Telephone Encounter (Signed)
Left vm (DPR) with details of Dr.Haglers recommendations to be evaluated by medical personnel either at an urgent care or the closest ED. Encouraged to call with any questions or concerns.

## 2018-05-02 ENCOUNTER — Ambulatory Visit (HOSPITAL_COMMUNITY)
Admission: RE | Admit: 2018-05-02 | Discharge: 2018-05-02 | Disposition: A | Discharge status: Home / Self Care | Payer: Medicare Other | Source: Ambulatory Visit | Attending: Family Medicine | Admitting: Family Medicine

## 2018-05-02 DIAGNOSIS — K5732 Diverticulitis of large intestine without perforation or abscess without bleeding: Secondary | ICD-10-CM | POA: Insufficient documentation

## 2018-05-02 DIAGNOSIS — I700 Atherosclerosis of aorta: Secondary | ICD-10-CM | POA: Diagnosis not present

## 2018-05-02 DIAGNOSIS — R1032 Left lower quadrant pain: Secondary | ICD-10-CM

## 2018-05-02 LAB — POCT I-STAT CREATININE: CREATININE: 0.8 mg/dL (ref 0.44–1.00)

## 2018-05-02 MED ORDER — IOPAMIDOL (ISOVUE-300) INJECTION 61%
100.0000 mL | Freq: Once | INTRAVENOUS | Status: AC | PRN
  Administered 2018-05-02: 100 mL via INTRAVENOUS

## 2018-05-06 ENCOUNTER — Encounter: Payer: Self-pay | Admitting: General Surgery

## 2018-05-06 ENCOUNTER — Other Ambulatory Visit: Payer: Self-pay

## 2018-05-06 ENCOUNTER — Telehealth: Payer: Self-pay | Admitting: Family Medicine

## 2018-05-06 ENCOUNTER — Ambulatory Visit (INDEPENDENT_AMBULATORY_CARE_PROVIDER_SITE_OTHER): Payer: Medicare Other | Admitting: General Surgery

## 2018-05-06 VITALS — BP 176/95 | HR 77 | Temp 97.8°F | Resp 18 | Wt 140.0 lb

## 2018-05-06 DIAGNOSIS — I10 Essential (primary) hypertension: Secondary | ICD-10-CM | POA: Diagnosis not present

## 2018-05-06 DIAGNOSIS — I25119 Atherosclerotic heart disease of native coronary artery with unspecified angina pectoris: Secondary | ICD-10-CM | POA: Diagnosis not present

## 2018-05-06 DIAGNOSIS — K5792 Diverticulitis of intestine, part unspecified, without perforation or abscess without bleeding: Secondary | ICD-10-CM

## 2018-05-06 MED ORDER — METRONIDAZOLE 250 MG PO TABS: 250.0000 mg | ORAL_TABLET | Freq: Three times a day (TID) | ORAL | 0 refills | Status: DC

## 2018-05-06 MED ORDER — METOPROLOL SUCCINATE ER 25 MG PO TB24: 25.0000 mg | ORAL_TABLET | Freq: Every day | ORAL | 1 refills | Status: DC

## 2018-05-06 MED ORDER — NEOMYCIN SULFATE 500 MG PO TABS
1000.0000 mg | ORAL_TABLET | Freq: Three times a day (TID) | ORAL | 0 refills | Status: DC
Start: 2018-05-06 — End: 2018-05-19

## 2018-05-06 MED ORDER — PEG 3350-KCL-NABCB-NACL-NASULF 236 G PO SOLR: 4000.0000 mL | Freq: Once | ORAL | 0 refills | Status: AC

## 2018-05-06 NOTE — Telephone Encounter (Signed)
Please call in metoprolol succinate (TOPROL-XL) 25 MG 24 hr tablet  Into the pharmacy

## 2018-05-06 NOTE — Patient Instructions (Signed)
Open Colectomy An open colectomy is surgery to remove part or all of the large intestine (colon). This procedure may be used to treat several conditions, including:  Inflammation and infection of the colon (diverticulitis).  Tumors or masses in the colon.  Inflammatory bowel disease, such as Crohn disease or ulcerative colitis.  Bleeding from the colon.  Blockage or obstruction of the colon.  Tell a health care provider about:  Any allergies you have.  All medicines you are taking, including vitamins, herbs, eye drops, creams, and over-the-counter medicines.  Any problems you or family members have had with anesthetic medicines.  Any blood disorders you have.  Any surgeries you have had.  Any medical conditions you have.  Whether you are pregnant or may be pregnant.  Whether you smoke or use tobacco products. These can affect your body's reaction to anesthesia. What are the risks? Generally, this is a safe procedure. However, problems may occur, including:  Infection.  Bleeding.  Allergic reactions to medicines.  Damage to other structures or organs.  Pneumonia.  The incision opening up.  Tissues from inside the abdomen bulging through the incision (hernia).  Reopening of the colon where it was stitched or stapled together.  A blood clot forming in a vein and traveling to the lungs.  Future blockage of the small intestine from scar tissue.  What happens before the procedure? Staying hydrated Follow instructions from your health care provider about hydration, which may include:  Up to 2 hours before the procedure - you may continue to drink clear liquids, such as water, clear fruit juice, black coffee, and plain tea.  Eating and drinking restrictions Follow instructions from your health care provider about eating and drinking, which may include:  8 hours before the procedure - stop eating heavy meals or foods such as meat, fried foods, or fatty foods.  6  hours before the procedure - stop eating light meals or foods, such as toast or cereal.  6 hours before the procedure - stop drinking milk or drinks that contain milk.  2 hours before the procedure - stop drinking clear liquids.  Bowel prep In some cases, you may be prescribed an oral bowel prep to clean out your colon. If so:  Take it as told by your health care provider. Starting the day before your procedure, you may need to drink a large amount of medicated liquid. The liquid will cause you to have multiple loose stools until your stool is almost clear or light green.  Follow instructions from your health care provider about eating and drinking restrictions during bowel prep.  Medicines  Ask your health care provider about: ? Changing or stopping your regular medicines or vitamins. This is especially important if you are taking diabetes medicines, blood thinners, or vitamin E. ? Taking medicines such as aspirin and ibuprofen. These medicines can thin your blood. Do not take these medicines before your procedure if your health care provider instructs you not to.  If you were prescribed an antibiotic medicine, take it as told by your health care provider. General instructions  Bring loose-fitting, comfortable clothing and slip-on shoes that you can put on without bending over.  Make sure to see your health care provider for any tests that you need before the procedure, such as: ? Blood tests. ? A test to check the heart's rhythm (electrocardiogram, ECG). ? A CT scan of your abdomen. ? Urine tests. ? Colonoscopy.  Plan to have someone take you home from the   hospital or clinic.  Arrange for someone to help you with your activities during your recovery. What happens during the procedure?  To reduce your risk of infection: ? Your health care team will wash or sanitize their hands. ? Your skin will be washed with soap. ? Hair may be removed from the surgical area.  An IV tube  will be inserted into one of your veins. The tube will be used to give you medicines and fluids.  You will be given a medicine to make you fall asleep (general anesthetic). You may also be given a medicine to help you relax (sedative).  Small monitors will be connected to your body. They will be used to check your heart, blood pressure, and oxygen level.  A breathing tube may be placed into your lungs during the procedure.  A thin, flexible tube (catheter) will be placed into your bladder to drain urine.  A tube may be inserted through your nose and into your stomach (nasogastric tube, or NG tube). The tube is used to remove stomach fluids after surgery until the intestines start working again.  An incision will be made in your abdomen.  Clamps or staples will be put on your colon.  The part of the colon between the clamps or staples will be removed.  The ends of the colon that remain will be stitched or stapled together.  The incision in your abdomen will be closed with stitches (sutures) or staples.  The incision will be covered with a bandage (dressing).  A small opening (stoma) may be created in your lower abdomen. A removable, external pouch (ostomy pouch) will be attached to the stoma. This pouch will collect stool outside of your body. Stool passes through the stoma and into the pouch instead of through your anus. The procedure may vary among health care providers and hospitals. What happens after the procedure?  Your blood pressure, heart rate, breathing rate, and blood oxygen level will be monitored until the medicines you were given have worn off.  You may continue to receive fluids and medicines through an IV tube.  You will start on a clear liquid diet and gradually go back to a normal diet.  Do not drive until your health care provider approves.  You may have some pain in your abdomen. You will be given pain medicine to control the pain.  You will be encouraged to  do the following: ? Do breathing exercises to prevent pneumonia. ? Get up and start walking within a day after surgery. You should try to get up 5-6 times a day. This information is not intended to replace advice given to you by your health care provider. Make sure you discuss any questions you have with your health care provider. Document Released: 07/22/2009 Document Revised: 06/25/2016 Document Reviewed: 06/25/2016 Elsevier Interactive Patient Education  2018 Elsevier Inc.  

## 2018-05-06 NOTE — Telephone Encounter (Signed)
Done

## 2018-05-06 NOTE — H&P (Signed)
Dorothy Lee is an 77 y.o. female.   Chief Complaint: Recurrent sigmoid diverticulitis HPI: Patient is a 77 year old white female who has had hospitalizations in the past for diverticulitis.  She has had multiple episodes and earlier this year was admitted for microperforation of the sigmoid colon.  She now presents for elective partial colectomy.  She currently has no abdominal pain, fever, diarrhea, constipation.  No chills have been noted.  She currently has 0 out of 10 abdominal pain.  Past Medical History:  Diagnosis Date  . Allergic rhinitis   . CAD (coronary artery disease)    DES RCA and LAD 02/2008, DES mid LAD 11/2012  . Cancer (HCC)    Rt Breast  . Cough   . Diverticulitis   . Diverticulosis   . Essential hypertension   . GERD (gastroesophageal reflux disease)   . Glaucoma   . History of breast cancer    Right - s/p XRT 1984, surgery, no chemo  . Hyperlipidemia   . Personal history of radiation therapy   . Rotator cuff syndrome, left   . Scoliosis   . Urinary incontinence     Past Surgical History:  Procedure Laterality Date  . APPENDECTOMY  1981  . BREAST BIOPSY  1984  . BREAST LUMPECTOMY  1982   right breast  . COLONOSCOPY  2008   Dr. Oletta Lamas: diverticulosis, hemorrhoids, conscious sedation  . COLONOSCOPY N/A 01/30/2016   Dr. Gala Romney: mild divertiuculosis in sigmoid colon. narrowing of the colon in association with the deverticular opening. 6 polyps removed, tubular adenoma. next TCS in 3 years  . CORONARY ANGIOPLASTY WITH STENT PLACEMENT  02/2008  . ESOPHAGOGASTRODUODENOSCOPY  2014   Dr. Fuller Plan: erosive gastritis, no h.pylori  . ESOPHAGOGASTRODUODENOSCOPY N/A 01/30/2016   Dr. Gala Romney: normal esophagus s/p dilation, small hh.  Marland Kitchen LEFT HEART CATHETERIZATION WITH CORONARY ANGIOGRAM N/A 12/02/2012   Procedure: LEFT HEART CATHETERIZATION WITH CORONARY ANGIOGRAM;  Surgeon: Jettie Booze, MD;  Location: [facility] CATH LAB;  Service: Cardiovascular;  Laterality: N/A;  . Venia Minks  DILATION N/A 01/30/2016   Procedure: Venia Minks DILATION;  Surgeon: Daneil Dolin, MD;  Location: [facility] ENDO SUITE;  Service: Endoscopy;  Laterality: N/A;  . NASAL SINUS SURGERY    . PERCUTANEOUS CORONARY STENT INTERVENTION (PCI-S)  12/02/2012   Procedure: PERCUTANEOUS CORONARY STENT INTERVENTION (PCI-S);  Surgeon: Jettie Booze, MD;  Location: [facility] CATH LAB;  Service: Cardiovascular;;  DES to the MID LAD    Family History  Problem Relation Age of Onset  . Emphysema Father   . Emphysema Brother   . Heart disease Brother        x3 brothers  . Lung cancer Brother   . Throat cancer Brother        head/neck cancer  . Heart attack Brother   . Heart attack Sister   . Heart attack Son        blood clot  . Colon cancer Neg Hx    Social History:  reports that she has never smoked. She has never used smokeless tobacco. She reports that she drank alcohol. She reports that she does not use drugs.  Allergies:  Allergies  Allergen Reactions  . Crestor [Rosuvastatin]     Crestor 20 mg caused muscle aches - pt is able to tolerate lower doses (takes 10mg )  . Lipitor [Atorvastatin]     Atorvastatin 40 mg caused fatigue  . Lisinopril Cough  . Simvastatin     Muscle aches    No medications  prior to admission.    No results found for this or any previous visit (from the past 48 hour(s)). No results found.  Review of Systems  Constitutional: Positive for malaise/fatigue.  HENT: Negative.   Eyes: Negative.   Respiratory: Negative.   Cardiovascular: Negative.   Gastrointestinal: Negative.   Genitourinary: Negative.   Musculoskeletal: Positive for myalgias.  Skin: Negative.   Neurological: Positive for headaches.  Endo/Heme/Allergies: Negative.   Psychiatric/Behavioral: Negative.     There were no vitals taken for this visit. Physical Exam  Vitals reviewed. Constitutional: She is oriented to person, place, and time. She appears well-developed and well-nourished. No distress.  HENT:   Head: Normocephalic and atraumatic.  Cardiovascular: Normal rate, regular rhythm and normal heart sounds. Exam reveals no gallop and no friction rub.  No murmur heard. Respiratory: Effort normal and breath sounds normal. No respiratory distress. She has no wheezes. She has no rales.  GI: Soft. Bowel sounds are normal. She exhibits no distension. There is no tenderness. There is no rebound and no guarding.  Neurological: She is alert and oriented to person, place, and time.  Skin: Skin is warm.     Assessment/Plan Impression: Recurrent diverticulitis, sigmoid Plan: Patient is scheduled for a partial colectomy on 05/19/2018.  The risks and benefits of the procedure including bleeding, infection, cardiopulmonary difficulties, and the possibility of an anastomotic leak were fully explained to the patient, who gave informed consent.  GoLYTELY, Flagyl, neomycin had all been prescribed.  Aviva Signs, MD 05/06/2018, 4:26 PM

## 2018-05-06 NOTE — Progress Notes (Signed)
Subjective:     Dorothy Lee  Patient is here to schedule surgery.  She states she had an episode of feeling faint, but it has been felt by other physicians in the past that this is migraines.  She does have intermittent headaches the back of her head.  Her abdominal pain has resolved.  She denies any chest pain or shortness of breath.  She was seen by cardiology earlier this year. Objective:    BP (!) 176/95 (BP Location: Left Arm, Patient Position: Sitting, Cuff Size: Normal)   Pulse 77   Temp 97.8 F (36.6 C) (Temporal)   Resp 18   Wt 140 lb (63.5 kg)   BMI 24.80 kg/m   General:  alert, cooperative and no distress       Assessment:    Recurrent sigmoid diverticulitis    Plan:   Patient is scheduled for a partial colectomy on 05/19/2018.  The risks and benefits of the procedure including bleeding, infection, cardiopulmonary difficulties, the possibility of a blood transfusion, and the possibility of anastomotic leak were fully explained to the patient, who gave informed consent.  GoLYTELY, Flagyl, neomycin have all been prescribed for bowel preparation.

## 2018-05-12 NOTE — Patient Instructions (Signed)
Straughn  05/12/2018     @PREFPERIOPPHARMACY @   Your procedure is scheduled on  05/19/2018 .  Report to [facility] at  835   A.M.  Call this number if you have problems the morning of surgery:  [phone]   Remember:  Do not eat or drink after midnight.  You may drink clear liquids until  (follow the instructions given to you) .  Clear liquids allowed are:                    Water, Juice (non-citric and without pulp), Carbonated beverages, Clear Tea, Black Coffee only, Plain Jell-O only, Gatorade and Plain Popsicles only    Take these medicines the morning of surgery with A SIP OF WATER  Imdur, metoprolol, protonix.    Do not wear jewelry, make-up or nail polish.  Do not wear lotions, powders, or perfumes, or deodorant.  Do not shave 48 hours prior to surgery.  Men may shave face and neck.  Do not bring valuables to the hospital.  [facility] is not responsible for any belongings or valuables.  Contacts, dentures or bridgework may not be worn into surgery.  Leave your suitcase in the car.  After surgery it may be brought to your room.  For patients admitted to the hospital, discharge time will be determined by your treatment team.  Patients discharged the day of surgery will not be allowed to drive home.   Name and phone number of your driver:   family Special instructions:  Follow the diet and prep instructions given to you by Dr Adline Mango.  Please read over the following fact sheets that you were given. Pain Booklet, Coughing and Deep Breathing, Blood Transfusion Information, Lab Information, MRSA Information, Surgical Site Infection Prevention, Anesthesia Post-op Instructions and Care and Recovery After Surgery       Open Colectomy An open colectomy is surgery to remove part or all of the large intestine (colon). This procedure may be used to treat several conditions, including:  Inflammation and infection of the colon (diverticulitis).  Tumors or  masses in the colon.  Inflammatory bowel disease, such as Crohn disease or ulcerative colitis.  Bleeding from the colon.  Blockage or obstruction of the colon.  Tell a health care provider about:  Any allergies you have.  All medicines you are taking, including vitamins, herbs, eye drops, creams, and over-the-counter medicines.  Any problems you or family members have had with anesthetic medicines.  Any blood disorders you have.  Any surgeries you have had.  Any medical conditions you have.  Whether you are pregnant or may be pregnant.  Whether you smoke or use tobacco products. These can affect your body's reaction to anesthesia. What are the risks? Generally, this is a safe procedure. However, problems may occur, including:  Infection.  Bleeding.  Allergic reactions to medicines.  Damage to other structures or organs.  Pneumonia.  The incision opening up.  Tissues from inside the abdomen bulging through the incision (hernia).  Reopening of the colon where it was stitched or stapled together.  A blood clot forming in a vein and traveling to the lungs.  Future blockage of the small intestine from scar tissue.  What happens before the procedure? Staying hydrated Follow instructions from your health care provider about hydration, which may include:  Up to 2 hours before the procedure - you may continue to drink clear liquids, such as  water, clear fruit juice, black coffee, and plain tea.  Eating and drinking restrictions Follow instructions from your health care provider about eating and drinking, which may include:  8 hours before the procedure - stop eating heavy meals or foods such as meat, fried foods, or fatty foods.  6 hours before the procedure - stop eating light meals or foods, such as toast or cereal.  6 hours before the procedure - stop drinking milk or drinks that contain milk.  2 hours before the procedure - stop drinking clear  liquids.  Bowel prep In some cases, you may be prescribed an oral bowel prep to clean out your colon. If so:  Take it as told by your health care provider. Starting the day before your procedure, you may need to drink a large amount of medicated liquid. The liquid will cause you to have multiple loose stools until your stool is almost clear or light green.  Follow instructions from your health care provider about eating and drinking restrictions during bowel prep.  Medicines  Ask your health care provider about: ? Changing or stopping your regular medicines or vitamins. This is especially important if you are taking diabetes medicines, blood thinners, or vitamin E. ? Taking medicines such as aspirin and ibuprofen. These medicines can thin your blood. Do not take these medicines before your procedure if your health care provider instructs you not to.  If you were prescribed an antibiotic medicine, take it as told by your health care provider. General instructions  Bring loose-fitting, comfortable clothing and slip-on shoes that you can put on without bending over.  Make sure to see your health care provider for any tests that you need before the procedure, such as: ? Blood tests. ? A test to check the heart's rhythm (electrocardiogram, ECG). ? A CT scan of your abdomen. ? Urine tests. ? Colonoscopy.  Plan to have someone take you home from the hospital or clinic.  Arrange for someone to help you with your activities during your recovery. What happens during the procedure?  To reduce your risk of infection: ? Your health care team will wash or sanitize their hands. ? Your skin will be washed with soap. ? Hair may be removed from the surgical area.  An IV tube will be inserted into one of your veins. The tube will be used to give you medicines and fluids.  You will be given a medicine to make you fall asleep (general anesthetic). You may also be given a medicine to help you relax  (sedative).  Small monitors will be connected to your body. They will be used to check your heart, blood pressure, and oxygen level.  A breathing tube may be placed into your lungs during the procedure.  A thin, flexible tube (catheter) will be placed into your bladder to drain urine.  A tube may be inserted through your nose and into your stomach (nasogastric tube, or NG tube). The tube is used to remove stomach fluids after surgery until the intestines start working again.  An incision will be made in your abdomen.  Clamps or staples will be put on your colon.  The part of the colon between the clamps or staples will be removed.  The ends of the colon that remain will be stitched or stapled together.  The incision in your abdomen will be closed with stitches (sutures) or staples.  The incision will be covered with a bandage (dressing).  A small opening (stoma) may be created  in your lower abdomen. A removable, external pouch (ostomy pouch) will be attached to the stoma. This pouch will collect stool outside of your body. Stool passes through the stoma and into the pouch instead of through your anus. The procedure may vary among health care providers and hospitals. What happens after the procedure?  Your blood pressure, heart rate, breathing rate, and blood oxygen level will be monitored until the medicines you were given have worn off.  You may continue to receive fluids and medicines through an IV tube.  You will start on a clear liquid diet and gradually go back to a normal diet.  Do not drive until your health care provider approves.  You may have some pain in your abdomen. You will be given pain medicine to control the pain.  You will be encouraged to do the following: ? Do breathing exercises to prevent pneumonia. ? Get up and start walking within a day after surgery. You should try to get up 5-6 times a day. This information is not intended to replace advice given to  you by your health care provider. Make sure you discuss any questions you have with your health care provider. Document Released: 07/22/2009 Document Revised: 06/25/2016 Document Reviewed: 06/25/2016 Elsevier Interactive Patient Education  2018 [employer].  Open Colectomy, Care After This sheet gives you information about how to care for yourself after your procedure. Your health care provider may also give you more specific instructions. If you have problems or questions, contact your health care provider. What can I expect after the procedure? After the procedure, it is common to have:  Pain in your abdomen, especially along your incision.  Tiredness. Your energy level will return to normal over the next several weeks.  Constipation.  Nausea.  Difficulty urinating.  Follow these instructions at home: Activity  You may be able to return to most of your normal activities within 1-2 weeks, such as working, walking up stairs, and sexual activity.  Avoid activities that require a lot of energy for 4-6 weeks after surgery, such as running, climbing, and lifting heavy objects. Ask your health care provider what activities are safe for you.  Take rest breaks during the day as needed.  Do not drive for 1-2 weeks or until your health care provider says that it is safe.  Do not drive or use heavy machinery while taking prescription pain medicines.  Do not lift anything that is heavier than 10 lb (4.3 kg) until your health care provider says that it is safe. Incision care  Follow instructions from your health care provider about how to take care of your incision. Make sure you: ? Wash your hands with soap and water before you change your bandage (dressing). If soap and water are not available, use hand sanitizer. ? Change your dressing as told by your health care provider. ? Leave stitches (sutures) or staples in place. These skin closures may need to stay in place for 2 weeks or  longer.  Avoid wearing tight clothing around your incision.  Protect your incision area from the sun.  Check your incision area every day for signs of infection. Check for: ? More redness, swelling, or pain. ? More fluid or blood. ? Warmth. ? Pus or a bad smell. General instructions  Do not take baths, swim, or use a hot tub until your health care provider approves. Ask your health care provider when you may shower.  Take over-the-counter and prescription medicines, including stool softeners, only  as told by your health care provider.  Eat a low-fat and low-fiber diet for the first 4 weeks after surgery.  Keep all follow-up visits as told by your health care provider. This is important. Contact a health care provider if:  You have more redness, swelling, or pain around your incision.  You have more fluid or blood coming from your incision.  Your incision feels warm to the touch.  You have pus or a bad smell coming from your incision.  You have a fever or chills.  You do not have a bowel movement 2-3 days after surgery.  You cannot eat or drink for 24 hours or more.  You have persistent nausea and vomiting.  You have abdominal pain that gets worse and does not get better with medicine. Get help right away if:  You have chest pain.  You have shortness of breath.  You have pain or swelling in your legs.  Your incision breaks open after your sutures or staples have been removed.  You have bleeding from the rectum. This information is not intended to replace advice given to you by your health care provider. Make sure you discuss any questions you have with your health care provider. Document Released: 04/17/2011 Document Revised: 06/25/2016 Document Reviewed: 06/25/2016 Elsevier Interactive Patient Education  2018 [place] Anesthesia, Adult General anesthesia is the use of medicines to make a person "go to sleep" (be unconscious) for a medical  procedure. General anesthesia is often recommended when a procedure:  Is long.  Requires you to be still or in an unusual position.  Is major and can cause you to lose blood.  Is impossible to do without general anesthesia.  The medicines used for general anesthesia are called general anesthetics. In addition to making you sleep, the medicines:  Prevent pain.  Control your blood pressure.  Relax your muscles.  Tell a health care provider about:  Any allergies you have.  All medicines you are taking, including vitamins, herbs, eye drops, creams, and over-the-counter medicines.  Any problems you or family members have had with anesthetic medicines.  Types of anesthetics you have had in the past.  Any bleeding disorders you have.  Any surgeries you have had.  Any medical conditions you have.  Any history of heart or lung conditions, such as heart failure, sleep apnea, or chronic obstructive pulmonary disease (COPD).  Whether you are pregnant or may be pregnant.  Whether you use tobacco, alcohol, marijuana, or street drugs.  Any history of Armed forces logistics/support/administrative officer.  Any history of depression or anxiety. What are the risks? Generally, this is a safe procedure. However, problems may occur, including:  Allergic reaction to anesthetics.  Lung and heart problems.  Inhaling food or liquids from your stomach into your lungs (aspiration).  Injury to nerves.  Waking up during your procedure and being unable to move (rare).  Extreme agitation or a state of mental confusion (delirium) when you wake up from the anesthetic.  Air in the bloodstream, which can lead to stroke.  These problems are more likely to develop if you are having a major surgery or if you have an advanced medical condition. You can prevent some of these complications by answering all of your health care provider's questions thoroughly and by following all pre-procedure instructions. General anesthesia can  cause side effects, including:  Nausea or vomiting  A sore throat from the breathing tube.  Feeling cold or shivery.  Feeling tired, washed out, or achy.  Sleepiness or drowsiness.  Confusion or agitation.  What happens before the procedure? Staying hydrated Follow instructions from your health care provider about hydration, which may include:  Up to 2 hours before the procedure - you may continue to drink clear liquids, such as water, clear fruit juice, black coffee, and plain tea.  Eating and drinking restrictions Follow instructions from your health care provider about eating and drinking, which may include:  8 hours before the procedure - stop eating heavy meals or foods such as meat, fried foods, or fatty foods.  6 hours before the procedure - stop eating light meals or foods, such as toast or cereal.  6 hours before the procedure - stop drinking milk or drinks that contain milk.  2 hours before the procedure - stop drinking clear liquids.  Medicines  Ask your health care provider about: ? Changing or stopping your regular medicines. This is especially important if you are taking diabetes medicines or blood thinners. ? Taking medicines such as aspirin and ibuprofen. These medicines can thin your blood. Do not take these medicines before your procedure if your health care provider instructs you not to. ? Taking new dietary supplements or medicines. Do not take these during the week before your procedure unless your health care provider approves them.  If you are told to take a medicine or to continue taking a medicine on the day of the procedure, take the medicine with sips of water. General instructions   Ask if you will be going home the same day, the following day, or after a longer hospital stay. ? Plan to have someone take you home. ? Plan to have someone stay with you for the first 24 hours after you leave the hospital or clinic.  For 3-6 weeks before the  procedure, try not to use any tobacco products, such as cigarettes, chewing tobacco, and e-cigarettes.  You may brush your teeth on the morning of the procedure, but make sure to spit out the toothpaste. What happens during the procedure?  You will be given anesthetics through a mask and through an IV tube in one of your veins.  You may receive medicine to help you relax (sedative).  As soon as you are asleep, a breathing tube may be used to help you breathe.  An anesthesia specialist will stay with you throughout the procedure. He or she will help keep you comfortable and safe by continuing to give you medicines and adjusting the amount of medicine that you get. He or she will also watch your blood pressure, pulse, and oxygen levels to make sure that the anesthetics do not cause any problems.  If a breathing tube was used to help you breathe, it will be removed before you wake up. The procedure may vary among health care providers and hospitals. What happens after the procedure?  You will wake up, often slowly, after the procedure is complete, usually in a recovery area.  Your blood pressure, heart rate, breathing rate, and blood oxygen level will be monitored until the medicines you were given have worn off.  You may be given medicine to help you calm down if you feel anxious or agitated.  If you will be going home the same day, your health care provider may check to make sure you can stand, drink, and urinate.  Your health care providers will treat your pain and side effects before you go home.  Do not drive for 24 hours if you received a sedative.  You may: ? Feel nauseous and vomit. ? Have a sore throat. ? Have mental slowness. ? Feel cold or shivery. ? Feel sleepy. ? Feel tired. ? Feel sore or achy, even in parts of your body where you did not have surgery. This information is not intended to replace advice given to you by your health care provider. Make sure you discuss  any questions you have with your health care provider. Document Released: 01/01/2008 Document Revised: 03/06/2016 Document Reviewed: 09/08/2015 Elsevier Interactive Patient Education  2018 [place] Anesthesia, Adult, Care After These instructions provide you with information about caring for yourself after your procedure. Your health care provider may also give you more specific instructions. Your treatment has been planned according to current medical practices, but problems sometimes occur. Call your health care provider if you have any problems or questions after your procedure. What can I expect after the procedure? After the procedure, it is common to have:  Vomiting.  A sore throat.  Mental slowness.  It is common to feel:  Nauseous.  Cold or shivery.  Sleepy.  Tired.  Sore or achy, even in parts of your body where you did not have surgery.  Follow these instructions at home: For at least 24 hours after the procedure:  Do not: ? Participate in activities where you could fall or become injured. ? Drive. ? Use heavy machinery. ? Drink alcohol. ? Take sleeping pills or medicines that cause drowsiness. ? Make important decisions or sign legal documents. ? Take care of children on your own.  Rest. Eating and drinking  If you vomit, drink water, juice, or soup when you can drink without vomiting.  Drink enough fluid to keep your urine clear or pale yellow.  Make sure you have little or no nausea before eating solid foods.  Follow the diet recommended by your health care provider. General instructions  Have a responsible adult stay with you until you are awake and alert.  Return to your normal activities as told by your health care provider. Ask your health care provider what activities are safe for you.  Take over-the-counter and prescription medicines only as told by your health care provider.  If you smoke, do not smoke without  supervision.  Keep all follow-up visits as told by your health care provider. This is important. Contact a health care provider if:  You continue to have nausea or vomiting at home, and medicines are not helpful.  You cannot drink fluids or start eating again.  You cannot urinate after 8-12 hours.  You develop a skin rash.  You have fever.  You have increasing redness at the site of your procedure. Get help right away if:  You have difficulty breathing.  You have chest pain.  You have unexpected bleeding.  You feel that you are having a life-threatening or urgent problem. This information is not intended to replace advice given to you by your health care provider. Make sure you discuss any questions you have with your health care provider. Document Released: 12/31/2000 Document Revised: 02/27/2016 Document Reviewed: 09/08/2015 Elsevier Interactive Patient Education  [employer].

## 2018-05-15 ENCOUNTER — Encounter (HOSPITAL_COMMUNITY): Payer: Self-pay

## 2018-05-15 ENCOUNTER — Other Ambulatory Visit: Payer: Self-pay | Admitting: *Deleted

## 2018-05-15 ENCOUNTER — Encounter (HOSPITAL_COMMUNITY)
Admission: RE | Admit: 2018-05-15 | Discharge: 2018-05-15 | Disposition: A | Discharge status: Home / Self Care | Payer: Medicare Other | Source: Ambulatory Visit | Attending: General Surgery | Admitting: General Surgery

## 2018-05-15 DIAGNOSIS — Z01818 Encounter for other preprocedural examination: Secondary | ICD-10-CM | POA: Diagnosis not present

## 2018-05-15 DIAGNOSIS — K5792 Diverticulitis of intestine, part unspecified, without perforation or abscess without bleeding: Secondary | ICD-10-CM | POA: Diagnosis not present

## 2018-05-15 DIAGNOSIS — Z01810 Encounter for preprocedural cardiovascular examination: Secondary | ICD-10-CM | POA: Diagnosis not present

## 2018-05-15 DIAGNOSIS — R9431 Abnormal electrocardiogram [ECG] [EKG]: Secondary | ICD-10-CM | POA: Diagnosis not present

## 2018-05-15 LAB — CBC WITH DIFFERENTIAL/PLATELET
BASOS ABS: 0 10*3/uL (ref 0.0–0.1)
BASOS PCT: 1 %
EOS ABS: 0.4 10*3/uL (ref 0.0–0.7)
EOS PCT: 8 %
HCT: 40.9 % (ref 36.0–46.0)
HEMOGLOBIN: 13.1 g/dL (ref 12.0–15.0)
LYMPHS ABS: 1.9 10*3/uL (ref 0.7–4.0)
Lymphocytes Relative: 32 %
MCH: 29.8 pg (ref 26.0–34.0)
MCHC: 32 g/dL (ref 30.0–36.0)
MCV: 93 fL (ref 78.0–100.0)
Monocytes Absolute: 0.5 10*3/uL (ref 0.1–1.0)
Monocytes Relative: 8 %
NEUTROS PCT: 51 %
Neutro Abs: 3 10*3/uL (ref 1.7–7.7)
Platelets: 305 10*3/uL (ref 150–400)
RBC: 4.4 MIL/uL (ref 3.87–5.11)
RDW: 13.8 % (ref 11.5–15.5)
WBC: 5.9 10*3/uL (ref 4.0–10.5)

## 2018-05-15 LAB — TYPE AND SCREEN
ABO/RH(D): O POS
ANTIBODY SCREEN: NEGATIVE

## 2018-05-15 LAB — BASIC METABOLIC PANEL
Anion gap: 6 (ref 5–15)
BUN: 15 mg/dL (ref 8–23)
CHLORIDE: 108 mmol/L (ref 98–111)
CO2: 26 mmol/L (ref 22–32)
Calcium: 8.9 mg/dL (ref 8.9–10.3)
Creatinine, Ser: 0.85 mg/dL (ref 0.44–1.00)
Glucose, Bld: 124 mg/dL — ABNORMAL HIGH (ref 70–99)
Potassium: 3.8 mmol/L (ref 3.5–5.1)
SODIUM: 140 mmol/L (ref 135–145)

## 2018-05-15 MED ORDER — ROSUVASTATIN CALCIUM 10 MG PO TABS: 10.0000 mg | ORAL_TABLET | Freq: Every day | ORAL | 1 refills | Status: DC

## 2018-05-19 ENCOUNTER — Inpatient Hospital Stay (HOSPITAL_COMMUNITY): Payer: Medicare Other | Admitting: Anesthesiology

## 2018-05-19 ENCOUNTER — Encounter (HOSPITAL_COMMUNITY): Payer: Self-pay | Admitting: Anesthesiology

## 2018-05-19 ENCOUNTER — Inpatient Hospital Stay (HOSPITAL_COMMUNITY)
Admission: RE | Admit: 2018-05-19 | Discharge: 2018-05-22 | DRG: 331 | Disposition: A | Discharge status: Home / Self Care | Payer: Medicare Other | Source: Ambulatory Visit | Attending: General Surgery | Admitting: General Surgery

## 2018-05-19 ENCOUNTER — Other Ambulatory Visit: Payer: Self-pay

## 2018-05-19 ENCOUNTER — Encounter (HOSPITAL_COMMUNITY)
Admission: RE | Disposition: A | Discharge status: Home / Self Care | Payer: Self-pay | Source: Ambulatory Visit | Attending: General Surgery

## 2018-05-19 DIAGNOSIS — K219 Gastro-esophageal reflux disease without esophagitis: Secondary | ICD-10-CM | POA: Diagnosis present

## 2018-05-19 DIAGNOSIS — I10 Essential (primary) hypertension: Secondary | ICD-10-CM | POA: Diagnosis present

## 2018-05-19 DIAGNOSIS — Z923 Personal history of irradiation: Secondary | ICD-10-CM | POA: Diagnosis not present

## 2018-05-19 DIAGNOSIS — Z79899 Other long term (current) drug therapy: Secondary | ICD-10-CM | POA: Diagnosis not present

## 2018-05-19 DIAGNOSIS — K5730 Diverticulosis of large intestine without perforation or abscess without bleeding: Secondary | ICD-10-CM

## 2018-05-19 DIAGNOSIS — Z955 Presence of coronary angioplasty implant and graft: Secondary | ICD-10-CM | POA: Diagnosis not present

## 2018-05-19 DIAGNOSIS — Z9049 Acquired absence of other specified parts of digestive tract: Secondary | ICD-10-CM

## 2018-05-19 DIAGNOSIS — K5732 Diverticulitis of large intestine without perforation or abscess without bleeding: Principal | ICD-10-CM | POA: Diagnosis present

## 2018-05-19 DIAGNOSIS — Z853 Personal history of malignant neoplasm of breast: Secondary | ICD-10-CM

## 2018-05-19 DIAGNOSIS — Z888 Allergy status to other drugs, medicaments and biological substances status: Secondary | ICD-10-CM | POA: Diagnosis not present

## 2018-05-19 DIAGNOSIS — H409 Unspecified glaucoma: Secondary | ICD-10-CM | POA: Diagnosis present

## 2018-05-19 DIAGNOSIS — J45909 Unspecified asthma, uncomplicated: Secondary | ICD-10-CM | POA: Diagnosis not present

## 2018-05-19 DIAGNOSIS — E785 Hyperlipidemia, unspecified: Secondary | ICD-10-CM | POA: Diagnosis present

## 2018-05-19 DIAGNOSIS — I2510 Atherosclerotic heart disease of native coronary artery without angina pectoris: Secondary | ICD-10-CM | POA: Diagnosis present

## 2018-05-19 HISTORY — PX: PARTIAL COLECTOMY: SHX5273

## 2018-05-19 SURGERY — COLECTOMY, PARTIAL
Anesthesia: General | Site: Abdomen

## 2018-05-19 MED ORDER — ONDANSETRON HCL 4 MG/2ML IJ SOLN
INTRAMUSCULAR | Status: AC
  Filled 2018-05-19: qty 2

## 2018-05-19 MED ORDER — BUPIVACAINE LIPOSOME 1.3 % IJ SUSP
INTRAMUSCULAR | Status: DC | PRN
  Administered 2018-05-19: 20 mL

## 2018-05-19 MED ORDER — ACETAMINOPHEN 650 MG RE SUPP: 650.0000 mg | Freq: Four times a day (QID) | RECTAL | Status: DC | PRN

## 2018-05-19 MED ORDER — 0.9 % SODIUM CHLORIDE (POUR BTL) OPTIME
TOPICAL | Status: DC | PRN
  Administered 2018-05-19: 2000 mL

## 2018-05-19 MED ORDER — LORAZEPAM 2 MG/ML IJ SOLN: 0.5000 mg | INTRAMUSCULAR | Status: DC | PRN

## 2018-05-19 MED ORDER — ENOXAPARIN SODIUM 40 MG/0.4ML ~~LOC~~ SOLN
40.0000 mg | Freq: Once | SUBCUTANEOUS | Status: AC
  Administered 2018-05-19: 40 mg via SUBCUTANEOUS
  Filled 2018-05-19: qty 0.4

## 2018-05-19 MED ORDER — FENTANYL CITRATE (PF) 100 MCG/2ML IJ SOLN
INTRAMUSCULAR | Status: DC | PRN
  Administered 2018-05-19: 25 ug via INTRAVENOUS
  Administered 2018-05-19: 50 ug via INTRAVENOUS
  Administered 2018-05-19: 25 ug via INTRAVENOUS

## 2018-05-19 MED ORDER — MEPERIDINE HCL 50 MG/ML IJ SOLN: 6.2500 mg | INTRAMUSCULAR | Status: DC | PRN

## 2018-05-19 MED ORDER — GLYCOPYRROLATE 0.2 MG/ML IJ SOLN
INTRAMUSCULAR | Status: DC | PRN
  Administered 2018-05-19: 0.4 mg via INTRAVENOUS

## 2018-05-19 MED ORDER — PROPOFOL 10 MG/ML IV BOLUS
INTRAVENOUS | Status: AC
  Filled 2018-05-19: qty 20

## 2018-05-19 MED ORDER — DEXAMETHASONE SODIUM PHOSPHATE 4 MG/ML IJ SOLN
INTRAMUSCULAR | Status: AC
  Filled 2018-05-19: qty 1

## 2018-05-19 MED ORDER — EPHEDRINE SULFATE 50 MG/ML IJ SOLN
INTRAMUSCULAR | Status: AC
  Filled 2018-05-19: qty 1

## 2018-05-19 MED ORDER — NITROGLYCERIN 0.4 MG SL SUBL: 0.4000 mg | SUBLINGUAL_TABLET | SUBLINGUAL | Status: DC | PRN

## 2018-05-19 MED ORDER — FENTANYL CITRATE (PF) 100 MCG/2ML IJ SOLN
INTRAMUSCULAR | Status: AC
  Filled 2018-05-19: qty 2

## 2018-05-19 MED ORDER — POVIDONE-IODINE 10 % EX OINT
TOPICAL_OINTMENT | CUTANEOUS | Status: AC
  Filled 2018-05-19: qty 1

## 2018-05-19 MED ORDER — ROSUVASTATIN CALCIUM 10 MG PO TABS
10.0000 mg | ORAL_TABLET | Freq: Every day | ORAL | Status: DC
  Administered 2018-05-19 – 2018-05-22 (×4): 10 mg via ORAL
  Filled 2018-05-19 (×4): qty 1

## 2018-05-19 MED ORDER — BUPIVACAINE LIPOSOME 1.3 % IJ SUSP
INTRAMUSCULAR | Status: AC
  Filled 2018-05-19: qty 20

## 2018-05-19 MED ORDER — METOPROLOL SUCCINATE ER 25 MG PO TB24
25.0000 mg | ORAL_TABLET | Freq: Every day | ORAL | Status: DC
  Administered 2018-05-19 – 2018-05-22 (×4): 25 mg via ORAL
  Filled 2018-05-19 (×4): qty 1

## 2018-05-19 MED ORDER — ROCURONIUM BROMIDE 100 MG/10ML IV SOLN
INTRAVENOUS | Status: DC | PRN
  Administered 2018-05-19: 40 mg via INTRAVENOUS

## 2018-05-19 MED ORDER — NEOSTIGMINE METHYLSULFATE 10 MG/10ML IV SOLN
INTRAVENOUS | Status: DC | PRN
  Administered 2018-05-19: 2 mg via INTRAVENOUS

## 2018-05-19 MED ORDER — ENOXAPARIN SODIUM 40 MG/0.4ML ~~LOC~~ SOLN
40.0000 mg | SUBCUTANEOUS | Status: DC
  Administered 2018-05-20 – 2018-05-22 (×3): 40 mg via SUBCUTANEOUS
  Filled 2018-05-19 (×3): qty 0.4

## 2018-05-19 MED ORDER — ISOSORBIDE MONONITRATE ER 60 MG PO TB24
30.0000 mg | ORAL_TABLET | Freq: Every day | ORAL | Status: DC
  Administered 2018-05-19 – 2018-05-22 (×4): 30 mg via ORAL
  Filled 2018-05-19 (×4): qty 1

## 2018-05-19 MED ORDER — LACTATED RINGERS IV SOLN
INTRAVENOUS | Status: DC
  Administered 2018-05-19: 07:00:00 via INTRAVENOUS

## 2018-05-19 MED ORDER — GLYCOPYRROLATE 0.2 MG/ML IJ SOLN
INTRAMUSCULAR | Status: AC
  Filled 2018-05-19: qty 2

## 2018-05-19 MED ORDER — ACETAMINOPHEN 325 MG PO TABS
650.0000 mg | ORAL_TABLET | Freq: Four times a day (QID) | ORAL | Status: DC | PRN
Start: 2018-05-19 — End: 2018-05-22

## 2018-05-19 MED ORDER — FENTANYL CITRATE (PF) 100 MCG/2ML IJ SOLN
25.0000 ug | INTRAMUSCULAR | Status: DC | PRN
  Administered 2018-05-19 – 2018-05-20 (×4): 25 ug via INTRAVENOUS
  Filled 2018-05-19 (×4): qty 2

## 2018-05-19 MED ORDER — CHLORHEXIDINE GLUCONATE CLOTH 2 % EX PADS: 6.0000 | MEDICATED_PAD | Freq: Once | CUTANEOUS | Status: DC

## 2018-05-19 MED ORDER — SODIUM CHLORIDE 0.9 % IV SOLN
2.0000 g | INTRAVENOUS | Status: AC
  Administered 2018-05-19: 2 g via INTRAVENOUS
  Filled 2018-05-19: qty 2

## 2018-05-19 MED ORDER — KETOROLAC TROMETHAMINE 15 MG/ML IJ SOLN: 15.0000 mg | Freq: Four times a day (QID) | INTRAMUSCULAR | Status: DC | PRN

## 2018-05-19 MED ORDER — KETOROLAC TROMETHAMINE 15 MG/ML IJ SOLN
15.0000 mg | Freq: Four times a day (QID) | INTRAMUSCULAR | Status: AC
  Filled 2018-05-19: qty 1

## 2018-05-19 MED ORDER — ONDANSETRON HCL 4 MG/2ML IJ SOLN
4.0000 mg | Freq: Four times a day (QID) | INTRAMUSCULAR | Status: DC | PRN
Start: 2018-05-19 — End: 2018-05-22

## 2018-05-19 MED ORDER — ONDANSETRON HCL 4 MG/2ML IJ SOLN
INTRAMUSCULAR | Status: DC | PRN
  Administered 2018-05-19: 4 mg via INTRAVENOUS

## 2018-05-19 MED ORDER — DIPHENHYDRAMINE HCL 12.5 MG/5ML PO ELIX: 12.5000 mg | ORAL_SOLUTION | Freq: Four times a day (QID) | ORAL | Status: DC | PRN

## 2018-05-19 MED ORDER — HYDROCODONE-ACETAMINOPHEN 5-325 MG PO TABS
1.0000 | ORAL_TABLET | ORAL | Status: DC | PRN
  Administered 2018-05-20 – 2018-05-22 (×8): 1 via ORAL
  Filled 2018-05-19 (×8): qty 1

## 2018-05-19 MED ORDER — ONDANSETRON 4 MG PO TBDP: 4.0000 mg | ORAL_TABLET | Freq: Four times a day (QID) | ORAL | Status: DC | PRN

## 2018-05-19 MED ORDER — ALVIMOPAN 12 MG PO CAPS
12.0000 mg | ORAL_CAPSULE | ORAL | Status: AC
  Administered 2018-05-19: 12 mg via ORAL
  Filled 2018-05-19: qty 1

## 2018-05-19 MED ORDER — LIDOCAINE HCL (PF) 1 % IJ SOLN
INTRAMUSCULAR | Status: AC
  Filled 2018-05-19: qty 5

## 2018-05-19 MED ORDER — NEOSTIGMINE METHYLSULFATE 10 MG/10ML IV SOLN
INTRAVENOUS | Status: AC
  Filled 2018-05-19: qty 1

## 2018-05-19 MED ORDER — LACTATED RINGERS IV SOLN
INTRAVENOUS | Status: DC
  Administered 2018-05-19 – 2018-05-21 (×4): via INTRAVENOUS

## 2018-05-19 MED ORDER — ONDANSETRON HCL 4 MG/2ML IJ SOLN: 4.0000 mg | Freq: Once | INTRAMUSCULAR | Status: DC | PRN

## 2018-05-19 MED ORDER — POVIDONE-IODINE 10 % OINT PACKET
TOPICAL_OINTMENT | CUTANEOUS | Status: DC | PRN
  Administered 2018-05-19: 1 via TOPICAL

## 2018-05-19 MED ORDER — DEXAMETHASONE SODIUM PHOSPHATE 4 MG/ML IJ SOLN
INTRAMUSCULAR | Status: DC | PRN
  Administered 2018-05-19: 4 mg via INTRAVENOUS

## 2018-05-19 MED ORDER — ALVIMOPAN 12 MG PO CAPS
12.0000 mg | ORAL_CAPSULE | Freq: Two times a day (BID) | ORAL | Status: DC
  Administered 2018-05-20 – 2018-05-21 (×3): 12 mg via ORAL
  Filled 2018-05-19 (×3): qty 1

## 2018-05-19 MED ORDER — LATANOPROST 0.005 % OP SOLN
1.0000 [drp] | Freq: Every day | OPHTHALMIC | Status: DC
  Administered 2018-05-19 – 2018-05-21 (×3): 1 [drp] via OPHTHALMIC
  Filled 2018-05-19 (×2): qty 2.5

## 2018-05-19 MED ORDER — ROCURONIUM BROMIDE 50 MG/5ML IV SOLN
INTRAVENOUS | Status: AC
  Filled 2018-05-19: qty 1

## 2018-05-19 MED ORDER — DIPHENHYDRAMINE HCL 50 MG/ML IJ SOLN: 12.5000 mg | Freq: Four times a day (QID) | INTRAMUSCULAR | Status: DC | PRN

## 2018-05-19 MED ORDER — HYDROCODONE-ACETAMINOPHEN 7.5-325 MG PO TABS: 1.0000 | ORAL_TABLET | Freq: Once | ORAL | Status: DC | PRN

## 2018-05-19 MED ORDER — SIMETHICONE 80 MG PO CHEW: 40.0000 mg | CHEWABLE_TABLET | Freq: Four times a day (QID) | ORAL | Status: DC | PRN

## 2018-05-19 MED ORDER — PROPOFOL 10 MG/ML IV BOLUS
INTRAVENOUS | Status: DC | PRN
  Administered 2018-05-19: 20 mg via INTRAVENOUS
  Administered 2018-05-19: 90 mg via INTRAVENOUS

## 2018-05-19 MED ORDER — KETOROLAC TROMETHAMINE 30 MG/ML IJ SOLN
30.0000 mg | Freq: Once | INTRAMUSCULAR | Status: AC | PRN
  Administered 2018-05-19: 30 mg via INTRAVENOUS
  Filled 2018-05-19: qty 1

## 2018-05-19 MED ORDER — PANTOPRAZOLE SODIUM 40 MG PO TBEC
40.0000 mg | DELAYED_RELEASE_TABLET | Freq: Every day | ORAL | Status: DC
  Administered 2018-05-19 – 2018-05-22 (×4): 40 mg via ORAL
  Filled 2018-05-19 (×4): qty 1

## 2018-05-19 MED ORDER — HYDROMORPHONE HCL 1 MG/ML IJ SOLN
0.2500 mg | INTRAMUSCULAR | Status: DC | PRN
  Administered 2018-05-19 (×4): 0.5 mg via INTRAVENOUS
  Filled 2018-05-19 (×4): qty 0.5

## 2018-05-19 SURGICAL SUPPLY — 77 items
BARRIER SKIN 2 3/4 (OSTOMY) IMPLANT
BARRIER SKIN 2 3/4 INCH (OSTOMY)
BARRIER SKIN OD2.25 2 3/4 FLNG (OSTOMY) IMPLANT
BLADE HEX COATED 2.75 (ELECTRODE) ×3 IMPLANT
BRR SKN FLT 2.75X2.25 2 PC (OSTOMY)
CELLS DAT CNTRL 66122 CELL SVR (MISCELLANEOUS) IMPLANT
CHLORAPREP W/TINT 26ML (MISCELLANEOUS) ×3 IMPLANT
CLAMP POUCH DRAINAGE QUIET (OSTOMY) IMPLANT
CLOTH BEACON ORANGE TIMEOUT ST (SAFETY) ×3 IMPLANT
COVER LIGHT HANDLE STERIS (MISCELLANEOUS) ×6 IMPLANT
COVER MAYO STAND XLG (DRAPE) ×3 IMPLANT
DRAPE UTILITY W/TAPE 26X15 (DRAPES) ×6 IMPLANT
DRAPE WARM FLUID 44X44 (DRAPE) ×3 IMPLANT
DRSG OPSITE POSTOP 4X8 (GAUZE/BANDAGES/DRESSINGS) ×2 IMPLANT
ELECT BLADE 6 FLAT ULTRCLN (ELECTRODE) IMPLANT
ELECT REM PT RETURN 9FT ADLT (ELECTROSURGICAL) ×3
ELECTRODE REM PT RTRN 9FT ADLT (ELECTROSURGICAL) ×1 IMPLANT
GLOVE BIO SURGEON STRL SZ 6.5 (GLOVE) ×4 IMPLANT
GLOVE BIO SURGEONS STRL SZ 6.5 (GLOVE) ×2
GLOVE BIOGEL M 6.5 STRL (GLOVE) ×4 IMPLANT
GLOVE BIOGEL PI IND STRL 6.5 (GLOVE) ×2 IMPLANT
GLOVE BIOGEL PI IND STRL 7.0 (GLOVE) ×4 IMPLANT
GLOVE BIOGEL PI INDICATOR 6.5 (GLOVE) ×4
GLOVE BIOGEL PI INDICATOR 7.0 (GLOVE) ×8
GLOVE ECLIPSE 6.5 STRL STRAW (GLOVE) ×4 IMPLANT
GLOVE SURG SS PI 7.5 STRL IVOR (GLOVE) ×6 IMPLANT
GOWN STRL REUS W/ TWL XL LVL3 (GOWN DISPOSABLE) ×2 IMPLANT
GOWN STRL REUS W/TWL LRG LVL3 (GOWN DISPOSABLE) ×12 IMPLANT
GOWN STRL REUS W/TWL XL LVL3 (GOWN DISPOSABLE) ×6
HANDLE SUCTION POOLE (INSTRUMENTS) ×1 IMPLANT
INST SET MAJOR GENERAL (KITS) ×3 IMPLANT
KIT BLADEGUARD II DBL (SET/KITS/TRAYS/PACK) ×3 IMPLANT
KIT TURNOVER KIT A (KITS) ×3 IMPLANT
LIGASURE IMPACT 36 18CM CVD LR (INSTRUMENTS) ×3 IMPLANT
MANIFOLD NEPTUNE II (INSTRUMENTS) ×3 IMPLANT
NDL HYPO 18GX1.5 BLUNT FILL (NEEDLE) ×1 IMPLANT
NDL HYPO 21X1.5 SAFETY (NEEDLE) ×1 IMPLANT
NEEDLE HYPO 18GX1.5 BLUNT FILL (NEEDLE) ×3 IMPLANT
NEEDLE HYPO 21X1.5 SAFETY (NEEDLE) ×3 IMPLANT
NS IRRIG 1000ML POUR BTL (IV SOLUTION) ×6 IMPLANT
PACK ABDOMINAL MAJOR (CUSTOM PROCEDURE TRAY) ×3 IMPLANT
PACK COLON (CUSTOM PROCEDURE TRAY) ×3 IMPLANT
PAD ARMBOARD 7.5X6 YLW CONV (MISCELLANEOUS) ×3 IMPLANT
PENCIL HANDSWITCHING (ELECTRODE) ×3 IMPLANT
POUCH OSTOMY 2 3/4  H 3804 (WOUND CARE)
POUCH OSTOMY 2 3/4 H 3804 (WOUND CARE)
POUCH OSTOMY 2 PC DRNBL 2.25 (WOUND CARE) IMPLANT
POUCH OSTOMY 2 PC DRNBL 2.75 (WOUND CARE) IMPLANT
POUCH OSTOMY DRNBL 2 1/4 (WOUND CARE)
RELOAD LINEAR CUT PROX 55 BLUE (ENDOMECHANICALS) IMPLANT
RELOAD PROXIMATE 75MM BLUE (ENDOMECHANICALS) ×6 IMPLANT
RELOAD STAPLE 55 3.8 BLU REG (ENDOMECHANICALS) IMPLANT
RELOAD STAPLE 75 3.8 BLU REG (ENDOMECHANICALS) IMPLANT
RETRACTOR WND ALEXIS 18 MED (MISCELLANEOUS) IMPLANT
RTRCTR WOUND ALEXIS 18CM MED (MISCELLANEOUS)
SHEET LAVH (DRAPES) ×2 IMPLANT
SPONGE LAP 18X18 X RAY DECT (DISPOSABLE) ×6 IMPLANT
STAPLER CUT CVD 40MM GREEN (STAPLE) ×2 IMPLANT
STAPLER GUN LINEAR PROX 60 (STAPLE) ×3 IMPLANT
STAPLER PROXIMATE 55 BLUE (STAPLE) IMPLANT
STAPLER PROXIMATE 75MM BLUE (STAPLE) ×2 IMPLANT
STAPLER VISISTAT (STAPLE) ×3 IMPLANT
SUCTION POOLE HANDLE (INSTRUMENTS) ×3
SUCTION YANKAUER HANDLE (MISCELLANEOUS) ×3 IMPLANT
SUT CHROMIC 0 SH (SUTURE) IMPLANT
SUT CHROMIC 2 0 SH (SUTURE) IMPLANT
SUT CHROMIC 3 0 SH 27 (SUTURE) IMPLANT
SUT NOVA NAB GS-26 0 60 (SUTURE) IMPLANT
SUT PDS AB 0 CTX 60 (SUTURE) IMPLANT
SUT PDS AB CT VIOLET #0 27IN (SUTURE) ×4 IMPLANT
SUT SILK 2 0 (SUTURE)
SUT SILK 2-0 18XBRD TIE 12 (SUTURE) IMPLANT
SUT SILK 3 0 SH CR/8 (SUTURE) ×3 IMPLANT
SYR 20CC LL (SYRINGE) ×3 IMPLANT
TOWEL OR 17X26 4PK STRL BLUE (TOWEL DISPOSABLE) ×3 IMPLANT
TRAY FOLEY CATH 14FRSI W/METER (CATHETERS) ×2 IMPLANT
YANKAUER SUCT BULB TIP 10FT TU (MISCELLANEOUS) ×3 IMPLANT

## 2018-05-19 NOTE — Anesthesia Procedure Notes (Signed)
Procedure Name: Intubation Date/Time: 05/19/2018 7:46 AM Performed by: Adalberto Ill, CRNA Pre-anesthesia Checklist: Patient identified, Emergency Drugs available, Suction available, Patient being monitored and Timeout performed Patient Re-evaluated:Patient Re-evaluated prior to induction Oxygen Delivery Method: Circle system utilized Preoxygenation: Pre-oxygenation with 100% oxygen Induction Type: IV induction Ventilation: Mask ventilation without difficulty Laryngoscope Size: Miller and 2 Grade View: Grade I Tube type: Oral Tube size: 7.0 mm Number of attempts: 1 Airway Equipment and Method: Stylet Placement Confirmation: ETT inserted through vocal cords under direct vision,  positive ETCO2 and breath sounds checked- equal and bilateral Secured at: 21 cm Tube secured with: Tape Dental Injury: Teeth and Oropharynx as per pre-operative assessment

## 2018-05-19 NOTE — Interval H&P Note (Signed)
History and Physical Interval Note:  05/19/2018 7:16 AM  Dorothy Lee  has presented today for surgery, with the diagnosis of diverticulitis  The various methods of treatment have been discussed with the patient and family. After consideration of risks, benefits and other options for treatment, the patient has consented to  Procedure(s): PARTIAL COLECTOMY (N/A) as a surgical intervention .  The patient's history has been reviewed, patient examined, no change in status, stable for surgery.  I have reviewed the patient's chart and labs.  Questions were answered to the patient's satisfaction.     Aviva Signs

## 2018-05-19 NOTE — Op Note (Signed)
Patient:  Dorothy Lee  DOB:  09-05-1941  MRN:  [id number]   Preop Diagnosis: History of sigmoid diverticulitis  Postop Diagnosis: Same  Procedure: Partial colectomy  Surgeon: Aviva Signs, MD  Assistant: Blake Divine, MD  Anes: General endotracheal  Indications: Patient is a 77 year old white female who has had recurrent episodes of sigmoid diverticulitis now presents for partial colectomy.  The risks and benefits of the procedure including bleeding, infection, cardiopulmonary difficulties, anastomotic leak, the possibility of recurrence of her disease were fully explained to the patient, who gave informed consent.  Procedure note: The patient was placed in the low lithotomy position after induction of general endotracheal anesthesia.  The abdomen was prepped and draped using usual sterile technique with ChloraPrep and the perineum was prepped with Betadine.  Surgical site confirmation was performed.  A lower midline incision was made below the umbilicus to the suprapubic region.  The peritoneal cavity was entered into without difficulty.  The colon was palpated and there were 2 areas of old inflammation present in the mid and distal sigmoid colon regions.  The sigmoid colon was mobilized along its peritoneal reflection.  This was taken up the left paracolic gutter.  Care was taken to avoid the left ureter.  The mesentery on either side of the sigmoid colon and rectum was incised in order to gain mobility.  A GIA-75 stapler was placed at the colorectal junction and fired.  This was likewise done on the proximal sigmoid colon.  The mesentery was then divided using the LigaSure.  The specimen was then removed from the operative field.  A side-to-side colorectal anastomosis was performed using a GIA-75 stapler.  The colotomy was closed using a contour TA 60 stapler.  The staple line was bolstered using 3-0 silk sutures.  Surrounding omentum was placed over the anastomosis in order to protect  it.  A wide open anastomosis was palpated.  The lower abdomen was then copiously irrigated with normal saline.  All operating personnel then changed her gown and gloves.  A new set-up was used for closure.  The fascia was reapproximated using an 0 PDS running suture.  Subcutaneous layer was irrigated with normal saline.  Exparel was instilled into the surrounding wound.  The skin was closed using staples.  Betadine ointment and dry sterile dressings were applied.  All tape and needle counts were correct at the end of the procedure.  Patient was extubated in the operating room and transferred to PACU in stable condition.  Dr. Constance Haw assisted throughout the complete procedure as no scrub assistant was available.  Complications: None  EBL: 25 cc  Specimen: Sigmoid colon

## 2018-05-19 NOTE — Anesthesia Postprocedure Evaluation (Signed)
Anesthesia Post Note  Patient: Dorothy Lee  Procedure(s) Performed: PARTIAL COLECTOMY (N/A Abdomen)  Patient location during evaluation: Nursing Unit Anesthesia Type: General Level of consciousness: awake and alert Pain management: pain level controlled Vital Signs Assessment: post-procedure vital signs reviewed and stable Respiratory status: spontaneous breathing Cardiovascular status: stable Anesthetic complications: no     Last Vitals:  Vitals:   05/19/18 1012 05/19/18 1026  BP: (!) 116/54 (!) 121/51  Pulse: 85 72  Resp: 11 18  Temp:    SpO2: 99% 96%    Last Pain:  Vitals:   05/19/18 1012  TempSrc:   PainSc: 5                  ADAMS, AMY A

## 2018-05-19 NOTE — Transfer of Care (Signed)
Immediate Anesthesia Transfer of Care Note  Patient: Dorothy Lee  Procedure(s) Performed: PARTIAL COLECTOMY (N/A Abdomen)  Patient Location: PACU  Anesthesia Type:General  Level of Consciousness: awake, alert , oriented and patient cooperative  Airway & Oxygen Therapy: Patient Spontanous Breathing and Patient connected to nasal cannula oxygen  Post-op Assessment: Report given to RN and Post -op Vital signs reviewed and stable  Post vital signs: Reviewed and stable  Last Vitals:  Vitals Value Taken Time  BP 150/74 05/19/2018  9:06 AM  Temp    Pulse 78 05/19/2018  9:08 AM  Resp 19 05/19/2018  9:08 AM  SpO2 98 % 05/19/2018  9:08 AM  Vitals shown include unvalidated device data.  Last Pain:  Vitals:   05/19/18 0708  TempSrc: Oral  PainSc: 0-No pain      Patients Stated Pain Goal: 7 (97/84/78 4128)  Complications: No apparent anesthesia complications

## 2018-05-19 NOTE — Anesthesia Preprocedure Evaluation (Signed)
Anesthesia Evaluation  Patient identified by MRN, date of birth, ID band Patient awake    Reviewed: Allergy & Precautions, H&P , NPO status , Patient's Chart, lab work & pertinent test results  Airway Mallampati: II  TM Distance: >3 FB Neck ROM: full    Dental no notable dental hx.    Pulmonary neg pulmonary ROS, asthma ,    Pulmonary exam normal breath sounds clear to auscultation       Cardiovascular Exercise Tolerance: Good hypertension, + CAD  negative cardio ROS   Rhythm:regular Rate:Normal     Neuro/Psych negative neurological ROS  negative psych ROS   GI/Hepatic negative GI ROS, Neg liver ROS, GERD  ,  Endo/Other  negative endocrine ROS  Renal/GU negative Renal ROS  negative genitourinary   Musculoskeletal   Abdominal   Peds  Hematology negative hematology ROS (+)   Anesthesia Other Findings   Reproductive/Obstetrics negative OB ROS                             Anesthesia Physical Anesthesia Plan  ASA: III  Anesthesia Plan: General   Post-op Pain Management:    Induction:   PONV Risk Score and Plan:   Airway Management Planned:   Additional Equipment:   Intra-op Plan:   Post-operative Plan:   Informed Consent: I have reviewed the patients History and Physical, chart, labs and discussed the procedure including the risks, benefits and alternatives for the proposed anesthesia with the patient or authorized representative who has indicated his/her understanding and acceptance.   Dental Advisory Given  Plan Discussed with: CRNA  Anesthesia Plan Comments:         Anesthesia Quick Evaluation

## 2018-05-20 ENCOUNTER — Encounter (HOSPITAL_COMMUNITY): Payer: Self-pay | Admitting: General Surgery

## 2018-05-20 LAB — BASIC METABOLIC PANEL
Anion gap: 8 (ref 5–15)
BUN: 14 mg/dL (ref 8–23)
CHLORIDE: 107 mmol/L (ref 98–111)
CO2: 24 mmol/L (ref 22–32)
Calcium: 8.3 mg/dL — ABNORMAL LOW (ref 8.9–10.3)
Creatinine, Ser: 0.71 mg/dL (ref 0.44–1.00)
GFR calc Af Amer: >60 mL/min (ref >60)
GFR calc non Af Amer: >60 mL/min (ref >60)
Glucose, Bld: 112 mg/dL — ABNORMAL HIGH (ref 70–99)
POTASSIUM: 4 mmol/L (ref 3.5–5.1)
SODIUM: 139 mmol/L (ref 135–145)

## 2018-05-20 LAB — CBC
HEMATOCRIT: 38.2 % (ref 36.0–46.0)
Hemoglobin: 12.1 g/dL (ref 12.0–15.0)
MCH: 29.3 pg (ref 26.0–34.0)
MCHC: 31.7 g/dL (ref 30.0–36.0)
MCV: 92.5 fL (ref 78.0–100.0)
Platelets: 262 10*3/uL (ref 150–400)
RBC: 4.13 MIL/uL (ref 3.87–5.11)
RDW: 13.7 % (ref 11.5–15.5)
WBC: 9.3 10*3/uL (ref 4.0–10.5)

## 2018-05-20 LAB — PHOSPHORUS: PHOSPHORUS: 3.1 mg/dL (ref 2.5–4.6)

## 2018-05-20 LAB — MAGNESIUM: Magnesium: 1.9 mg/dL (ref 1.7–2.4)

## 2018-05-20 NOTE — Addendum Note (Signed)
Addendum  created 05/20/18 1107 by Charmaine Downs, CRNA   Sign clinical note

## 2018-05-20 NOTE — Anesthesia Postprocedure Evaluation (Signed)
Anesthesia Post Note  Patient: Dorothy Lee  Procedure(s) Performed: PARTIAL COLECTOMY (N/A Abdomen)  Patient location during evaluation: Nursing Unit Anesthesia Type: General Level of consciousness: awake and alert and patient cooperative Pain management: pain level controlled Vital Signs Assessment: post-procedure vital signs reviewed and stable Respiratory status: nonlabored ventilation, spontaneous breathing and respiratory function stable Cardiovascular status: blood pressure returned to baseline Postop Assessment: no apparent nausea or vomiting and adequate PO intake Anesthetic complications: no     Last Vitals:  Vitals:   05/19/18 2020 05/20/18 0524  BP: 121/66 (!) 116/53  Pulse: 71 71  Resp: 18 18  Temp: 36.4 C 36.7 C  SpO2: 96% 94%    Last Pain:  Vitals:   05/20/18 0857  TempSrc:   PainSc: 8                  Eara Burruel J

## 2018-05-20 NOTE — Progress Notes (Signed)
Patients foley catheter removed. Clear, yellow urine. No odor. Tolerated well. Patient instructed to urinate as soon as she is able. Will continue to monitor.

## 2018-05-20 NOTE — Progress Notes (Signed)
1 Day Post-Op  Subjective: Patient feels fine.  Minimal incisional pain.  No nausea or vomiting.  Has not had a bowel movement yet.  Objective: Vital signs in last 24 hours: Temp:  [97.6 F (36.4 C)-98.7 F (37.1 C)] 98 F (36.7 C) (08/13 0524) Pulse Rate:  [62-85] 71 (08/13 0524) Resp:  [5-19] 18 (08/13 0524) BP: (104-159)/(45-74) 116/53 (08/13 0524) SpO2:  [93 %-100 %] 94 % (08/13 0524) Last BM Date: 05/19/18  Intake/Output from previous day: 08/12 0701 - 08/13 0700 In: 2117.5 [P.O.:240; I.V.:1877.5] Out: 155 [Urine:130; Blood:25] Intake/Output this shift: No intake/output data recorded.  General appearance: alert, cooperative and no distress Resp: clear to auscultation bilaterally Cardio: regular rate and rhythm, S1, S2 normal, no murmur, click, rub or gallop GI: Soft, minimal bowel sounds appreciated.  Incision healing well.  Lab Results:  Recent Labs    05/20/18 0512  WBC 9.3  HGB 12.1  HCT 38.2  PLT 262   BMET Recent Labs    05/20/18 0512  NA 139  K 4.0  CL 107  CO2 24  GLUCOSE 112*  BUN 14  CREATININE 0.71  CALCIUM 8.3*   PT/INR No results for input(s): LABPROT, INR in the last 72 hours.  Studies/Results: No results found.  Anti-infectives: Anti-infectives (From admission, onward)   Start     Dose/Rate Route Frequency Ordered Stop   05/19/18 0630  cefoTEtan (CEFOTAN) 2 g in sodium chloride 0.9 % 100 mL IVPB     2 g 200 mL/hr over 30 Minutes Intravenous On call to O.R. 05/19/18 4656 05/19/18 0757      Assessment/Plan: s/p Procedure(s): PARTIAL COLECTOMY Impression: Stable on postoperative day 1.  Patient's Foley was removed, but no urine output was recorded.  BUN and creatinine are stable.  Will get patient up to bed.  Continue IV fluid.  Awaiting return of bowel function.  LOS: 1 day    Aviva Signs 05/20/2018

## 2018-05-21 LAB — CBC
HCT: 35.5 % — ABNORMAL LOW (ref 36.0–46.0)
HEMOGLOBIN: 11.1 g/dL — AB (ref 12.0–15.0)
MCH: 29.3 pg (ref 26.0–34.0)
MCHC: 31.3 g/dL (ref 30.0–36.0)
MCV: 93.7 fL (ref 78.0–100.0)
Platelets: 244 10*3/uL (ref 150–400)
RBC: 3.79 MIL/uL — AB (ref 3.87–5.11)
RDW: 14 % (ref 11.5–15.5)
WBC: 7.8 10*3/uL (ref 4.0–10.5)

## 2018-05-21 LAB — PHOSPHORUS: Phosphorus: 2.2 mg/dL — ABNORMAL LOW (ref 2.5–4.6)

## 2018-05-21 LAB — MAGNESIUM: MAGNESIUM: 1.8 mg/dL (ref 1.7–2.4)

## 2018-05-21 LAB — BASIC METABOLIC PANEL
Anion gap: 8 (ref 5–15)
BUN: 10 mg/dL (ref 8–23)
CHLORIDE: 106 mmol/L (ref 98–111)
CO2: 27 mmol/L (ref 22–32)
Calcium: 8.5 mg/dL — ABNORMAL LOW (ref 8.9–10.3)
Creatinine, Ser: 0.74 mg/dL (ref 0.44–1.00)
GFR calc Af Amer: >60 mL/min (ref >60)
GFR calc non Af Amer: >60 mL/min (ref >60)
Glucose, Bld: 88 mg/dL (ref 70–99)
Potassium: 3.6 mmol/L (ref 3.5–5.1)
SODIUM: 141 mmol/L (ref 135–145)

## 2018-05-21 MED ORDER — POTASSIUM PHOSPHATES 15 MMOLE/5ML IV SOLN
15.0000 mmol | Freq: Once | INTRAVENOUS | Status: AC
  Administered 2018-05-21: 15 mmol via INTRAVENOUS
  Filled 2018-05-21: qty 5

## 2018-05-21 NOTE — Progress Notes (Signed)
MEDICATION RELATED CONSULT NOTE - INITIAL   Pharmacy Consult for IV phosphorus Indication: hypophophatemia  Allergies  Allergen Reactions  . Crestor [Rosuvastatin]     Crestor 20 mg caused muscle aches - pt is able to tolerate lower doses (takes 10mg )  . Lipitor [Atorvastatin]     Atorvastatin 40 mg caused fatigue  . Lisinopril Cough  . Simvastatin     Muscle aches    Patient Measurements: Body Weight: 63.5kg  Vital Signs: Temp: 97.8 F (36.6 C) (08/14 0533) Temp Source: Oral (08/14 0533) BP: 148/64 (08/14 0533) Pulse Rate: 77 (08/14 0533) Intake/Output from previous day: No intake/output data recorded. Intake/Output from this shift: No intake/output data recorded.  Labs: Recent Labs    05/20/18 0512 05/21/18 0524  WBC 9.3 7.8  HGB 12.1 11.1*  HCT 38.2 35.5*  PLT 262 244  CREATININE 0.71 0.74  MG 1.9 1.8  PHOS 3.1 2.2*   Estimated Creatinine Clearance: 52 mL/min (by C-G formula based on SCr of 0.74 mg/dL).   Microbiology: No results found for this or any previous visit (from the past 720 hour(s)).  Medical History: Past Medical History:  Diagnosis Date  . Allergic rhinitis   . CAD (coronary artery disease)    DES RCA and LAD 02/2008, DES mid LAD 11/2012  . Cancer (HCC)    Rt Breast  . Cough   . Diverticulitis   . Diverticulosis   . Essential hypertension   . GERD (gastroesophageal reflux disease)   . Glaucoma   . History of breast cancer    Right - s/p XRT 1984, surgery, no chemo  . Hyperlipidemia   . Personal history of radiation therapy   . Rotator cuff syndrome, left   . Scoliosis   . Urinary incontinence     Medications:  See med rec  Assessment: 77 yo female s/p partial colectomy. Currently awaiting return of bowel function. Patient with hypophosphatemia. Pharmacy asked to replace.  Goal of Therapy:  Phosphate level >2.5   Plan:  KPhos 43mmol over 4-6hours x 1 today F/U labs  Isac Sarna, BS Vena [place], BCPS Clinical  Pharmacist Pager [phone] 05/21/2018,8:53 AM

## 2018-05-21 NOTE — Care Management Important Message (Signed)
Important Message  Patient Details  Name: Dorothy Lee MRN: [id number] Date of Birth: 1941-08-27   Medicare Important Message Given:  Yes    Shelda Altes 05/21/2018, 11:23 AM

## 2018-05-21 NOTE — Care Management Note (Signed)
Case Management Note  Patient Details  Name: Dorothy Lee MRN: [id number] Date of Birth: 06/10/1941  Subjective/Objective:  S/p partial colectomy. Pt from home ind with ADL's. Pt has insurance and PCP. Will f/u with surgeon. Pt communicates no needs or concerns.                   Action/Plan: DC home with self care.   Expected Discharge Date:    05/21/2018              Expected Discharge Plan:  Home/Self Care  In-House Referral:  NA  Discharge planning Services  CM Consult  Post Acute Care Choice:  NA Choice offered to:  NA  Status of Service:  Completed, signed off  If discussed at Long Length of Stay Meetings, dates discussed:    Additional Comments:  Sherald Barge, RN 05/21/2018, 9:36 AM

## 2018-05-21 NOTE — Progress Notes (Signed)
2 Days Post-Op  Subjective: Patient denies any incisional pain.  She has passed a little blood per rectum.  She is passing flatus.  Objective: Vital signs in last 24 hours: Temp:  [97.8 F (36.6 C)-98.5 F (36.9 C)] 97.8 F (36.6 C) (08/14 0533) Pulse Rate:  [69-77] 77 (08/14 0533) Resp:  [18] 18 (08/13 1518) BP: (109-148)/(55-64) 148/64 (08/14 0533) SpO2:  [95 %-97 %] 95 % (08/14 0533) Last BM Date: 05/19/18  Intake/Output from previous day: No intake/output data recorded. Intake/Output this shift: No intake/output data recorded.  General appearance: alert, cooperative and no distress Resp: clear to auscultation bilaterally Cardio: regular rate and rhythm, S1, S2 normal, no murmur, click, rub or gallop GI: Soft, dressing dry and intact.  Rare bowel sounds appreciated.  Lab Results:  Recent Labs    05/20/18 0512 05/21/18 0524  WBC 9.3 7.8  HGB 12.1 11.1*  HCT 38.2 35.5*  PLT 262 244   BMET Recent Labs    05/20/18 0512 05/21/18 0524  NA 139 141  K 4.0 3.6  CL 107 106  CO2 24 27  GLUCOSE 112* 88  BUN 14 10  CREATININE 0.71 0.74  CALCIUM 8.3* 8.5*   PT/INR No results for input(s): LABPROT, INR in the last 72 hours.  Studies/Results: No results found.  Anti-infectives: Anti-infectives (From admission, onward)   Start     Dose/Rate Route Frequency Ordered Stop   05/19/18 0630  cefoTEtan (CEFOTAN) 2 g in sodium chloride 0.9 % 100 mL IVPB     2 g 200 mL/hr over 30 Minutes Intravenous On call to O.R. 05/19/18 1829 05/19/18 0757      Assessment/Plan: s/p Procedure(s): PARTIAL COLECTOMY Impression: Stable on postoperative day 2.  Awaiting full return of bowel function.  Hypophosphatemia present and will be addressed.  Patient already ambulating.  LOS: 2 days    Aviva Signs 05/21/2018

## 2018-05-22 ENCOUNTER — Other Ambulatory Visit: Payer: Self-pay | Admitting: Gastroenterology

## 2018-05-22 LAB — BASIC METABOLIC PANEL
Anion gap: 8 (ref 5–15)
BUN: 6 mg/dL — AB (ref 8–23)
CALCIUM: 8.9 mg/dL (ref 8.9–10.3)
CHLORIDE: 103 mmol/L (ref 98–111)
CO2: 28 mmol/L (ref 22–32)
CREATININE: 0.72 mg/dL (ref 0.44–1.00)
GFR calc Af Amer: >60 mL/min (ref >60)
GFR calc non Af Amer: >60 mL/min (ref >60)
Glucose, Bld: 90 mg/dL (ref 70–99)
Potassium: 3.6 mmol/L (ref 3.5–5.1)
SODIUM: 139 mmol/L (ref 135–145)

## 2018-05-22 LAB — PHOSPHORUS: PHOSPHORUS: 3.3 mg/dL (ref 2.5–4.6)

## 2018-05-22 LAB — MAGNESIUM: Magnesium: 1.9 mg/dL (ref 1.7–2.4)

## 2018-05-22 MED ORDER — HYDROCODONE-ACETAMINOPHEN 5-325 MG PO TABS: 1.0000 | ORAL_TABLET | Freq: Four times a day (QID) | ORAL | 0 refills | Status: DC | PRN

## 2018-05-22 MED ORDER — SULFAMETHOXAZOLE-TRIMETHOPRIM 800-160 MG PO TABS: 1.0000 | ORAL_TABLET | Freq: Two times a day (BID) | ORAL | 0 refills | Status: DC

## 2018-05-22 NOTE — Discharge Instructions (Signed)
Open Colectomy, Care After °This sheet gives you information about how to care for yourself after your procedure. Your health care provider may also give you more specific instructions. If you have problems or questions, contact your health care provider. °What can I expect after the procedure? °After the procedure, it is common to have: °· Pain in your abdomen, especially along your incision. °· Tiredness. Your energy level will return to normal over the next several weeks. °· Constipation. °· Nausea. °· Difficulty urinating. ° °Follow these instructions at home: °Activity °· You may be able to return to most of your normal activities within 1-2 weeks, such as working, walking up stairs, and sexual activity. °· Avoid activities that require a lot of energy for 4-6 weeks after surgery, such as running, climbing, and lifting heavy objects. Ask your health care provider what activities are safe for you. °· Take rest breaks during the day as needed. °· Do not drive for 1-2 weeks or until your health care provider says that it is safe. °· Do not drive or use heavy machinery while taking prescription pain medicines. °· Do not lift anything that is heavier than 10 lb (4.3 kg) until your health care provider says that it is safe. °Incision care °· Follow instructions from your health care provider about how to take care of your incision. Make sure you: °? Wash your hands with soap and water before you change your bandage (dressing). If soap and water are not available, use hand sanitizer. °? Change your dressing as told by your health care provider. °? Leave stitches (sutures) or staples in place. These skin closures may need to stay in place for 2 weeks or longer. °· Avoid wearing tight clothing around your incision. °· Protect your incision area from the sun. °· Check your incision area every day for signs of infection. Check for: °? More redness, swelling, or pain. °? More fluid or blood. °? Warmth. °? Pus or a bad  smell. °General instructions °· Do not take baths, swim, or use a hot tub until your health care provider approves. Ask your health care provider when you may shower. °· Take over-the-counter and prescription medicines, including stool softeners, only as told by your health care provider. °· Eat a low-fat and low-fiber diet for the first 4 weeks after surgery. °· Keep all follow-up visits as told by your health care provider. This is important. °Contact a health care provider if: °· You have more redness, swelling, or pain around your incision. °· You have more fluid or blood coming from your incision. °· Your incision feels warm to the touch. °· You have pus or a bad smell coming from your incision. °· You have a fever or chills. °· You do not have a bowel movement 2-3 days after surgery. °· You cannot eat or drink for 24 hours or more. °· You have persistent nausea and vomiting. °· You have abdominal pain that gets worse and does not get better with medicine. °Get help right away if: °· You have chest pain. °· You have shortness of breath. °· You have pain or swelling in your legs. °· Your incision breaks open after your sutures or staples have been removed. °· You have bleeding from the rectum. °This information is not intended to replace advice given to you by your health care provider. Make sure you discuss any questions you have with your health care provider. °Document Released: 04/17/2011 Document Revised: 06/25/2016 Document Reviewed: 06/25/2016 °Elsevier Interactive Patient   Education © 2018 [employer] ° °

## 2018-05-22 NOTE — Discharge Summary (Signed)
Physician Discharge Summary  Patient ID: Dorothy Lee MRN: [id number] DOB/AGE: 06/05/1941 77 y.o.  Admit date: 05/19/2018 Discharge date: 05/22/2018  Admission Diagnoses: Sigmoid diverticulosis  Discharge Diagnoses: Same Active Problems:   Sigmoid diverticulosis   S/P partial colectomy   Discharged Condition: good  Hospital Course: Patient is a 77 year old white female with a history of recurrent episodes of sigmoid diverticulitis who underwent a partial colectomy on 05/19/2018.  She tolerated the procedure well.  Her postoperative course was remarkable for mild hypophosphatemia which resolved.  Her diet was advanced without difficulty once her bowel function returned.  Final pathology was negative for malignancy.  Patient is being discharged home on 05/22/2018 in good and improving condition.  Treatments: surgery: Partial colectomy on 05/19/2014  Discharge Exam: Blood pressure (!) 152/71, pulse 66, temperature 97.9 F (36.6 C), temperature source Oral, resp. rate 20, SpO2 94 %. General appearance: alert, cooperative and no distress Resp: clear to auscultation bilaterally Cardio: regular rate and rhythm, S1, S2 normal, no murmur, click, rub or gallop GI: Soft, incision healing well.  Active bowel sounds appreciated.  Disposition: home   Allergies as of 05/22/2018      Reactions   Crestor [rosuvastatin]    Crestor 20 mg caused muscle aches - pt is able to tolerate lower doses (takes 10mg )   Lipitor [atorvastatin]    Atorvastatin 40 mg caused fatigue   Lisinopril Cough   Simvastatin    Muscle aches      Medication List    STOP taking these medications   BC FAST PAIN RELIEF 845-65 MG Pack Generic drug:  Aspirin-Caffeine     TAKE these medications   aspirin EC 81 MG tablet Take 81 mg by mouth daily.   BENEFIBER PO Take 1 Dose by mouth daily.   HYDROcodone-acetaminophen 5-325 MG tablet Commonly known as:  NORCO/VICODIN Take 1 tablet by mouth every 6 (six) hours as  needed for moderate pain.   isosorbide mononitrate 30 MG 24 hr tablet Commonly known as:  IMDUR Take 1 tablet (30 mg total) by mouth daily.   latanoprost 0.005 % ophthalmic solution Commonly known as:  XALATAN Place 1 drop into both eyes at bedtime.   metoprolol succinate 25 MG 24 hr tablet Commonly known as:  TOPROL-XL Take 1 tablet (25 mg total) by mouth daily.   nitroGLYCERIN 0.4 MG SL tablet Commonly known as:  NITROSTAT Place 0.4 mg under the tongue every 5 (five) minutes x 3 doses as needed for chest pain. May repeat x3   pantoprazole 40 MG tablet Commonly known as:  PROTONIX Take 40 mg by mouth daily.   rosuvastatin 10 MG tablet Commonly known as:  CRESTOR Take 1 tablet (10 mg total) by mouth daily.   Vitamin D (Ergocalciferol) 50000 units Caps capsule Commonly known as:  DRISDOL Take 1 capsule (50,000 Units total) by mouth every 7 (seven) days. What changed:  when to take this      Follow-up Information    Aviva Signs, MD. Schedule an appointment as soon as possible for a visit on 05/27/2018.   Specialty:  General Surgery Contact information: [address] [address] [phone]           Signed: Aviva Signs 05/22/2018, 8:27 AM

## 2018-05-22 NOTE — Addendum Note (Signed)
Addended by: Annitta Needs on: 05/22/2018 02:04 PM   Modules accepted: Orders

## 2018-05-27 ENCOUNTER — Encounter: Payer: Self-pay | Admitting: General Surgery

## 2018-05-27 ENCOUNTER — Ambulatory Visit (INDEPENDENT_AMBULATORY_CARE_PROVIDER_SITE_OTHER): Payer: Self-pay | Admitting: General Surgery

## 2018-05-27 VITALS — BP 159/77 | HR 77 | Temp 98.9°F | Resp 18 | Wt 138.0 lb

## 2018-05-27 DIAGNOSIS — Z09 Encounter for follow-up examination after completed treatment for conditions other than malignant neoplasm: Secondary | ICD-10-CM

## 2018-05-27 NOTE — Progress Notes (Signed)
Subjective:     Dorothy Lee  Status post sigmoid colectomy.  Patient doing well.  Has no complaints.  Bowel movements are normal.  She denies any fever or chills. Objective:    BP (!) 159/77 (BP Location: Left Arm, Patient Position: Sitting, Cuff Size: Normal)   Pulse 77   Temp 98.9 F (37.2 C) (Temporal)   Resp 18   Wt 138 lb (62.6 kg)   BMI 24.45 kg/m   General:  alert, cooperative and no distress  Abdomen soft, incision healing well.  Staples removed, Steri-Strips applied. Final pathology consistent with diagnosis.  No malignancy seen.     Assessment:    Doing well postoperatively.    Plan:   Increase activity as able.  Follow-up here as needed.

## 2018-06-03 ENCOUNTER — Ambulatory Visit (INDEPENDENT_AMBULATORY_CARE_PROVIDER_SITE_OTHER): Payer: Self-pay | Admitting: General Surgery

## 2018-06-03 ENCOUNTER — Encounter: Payer: Self-pay | Admitting: General Surgery

## 2018-06-03 VITALS — BP 153/79 | HR 82 | Temp 97.5°F | Resp 16 | Wt 135.0 lb

## 2018-06-03 DIAGNOSIS — Z09 Encounter for follow-up examination after completed treatment for conditions other than malignant neoplasm: Secondary | ICD-10-CM

## 2018-06-03 MED ORDER — HYDROCODONE-ACETAMINOPHEN 5-325 MG PO TABS
1.0000 | ORAL_TABLET | Freq: Four times a day (QID) | ORAL | 0 refills | Status: DC | PRN
Start: 2018-06-03 — End: 2019-05-05

## 2018-06-03 NOTE — Progress Notes (Signed)
Subjective:     [name]  Status post partial colectomy.  Presents with a small area of wound dehiscence.  She states this started several days ago.  No purulent drainage was noted.  It appeared to be just yellow with some blood present.  She denies any fevers. Objective:    BP (!) 153/79 (BP Location: Left Arm, Patient Position: Sitting, Cuff Size: Normal)   Pulse 82   Temp (!) 97.5 F (36.4 C) (Temporal)   Resp 16   Wt 135 lb (61.2 kg)   BMI 23.91 kg/m   General:  alert, cooperative and no distress  Abdomen soft.  Small superficial wound dehiscence noted along the upper aspect of the surgical incision site.  Serous drainage present.  Mild erythema surrounding this is noted, though no induration is present.  No purulent drainage noted.  Steri-Strips reapplied.  Wound dehiscence was only 2 to 3 mm.     Assessment:    Superficial wound dehiscence, not infected    Plan:   I told the patient to keep the wound clean and dry.  No need for antibiotic at this time.  We will see her in 1 week for follow-up.

## 2018-06-10 ENCOUNTER — Ambulatory Visit (INDEPENDENT_AMBULATORY_CARE_PROVIDER_SITE_OTHER): Payer: Self-pay | Admitting: General Surgery

## 2018-06-10 ENCOUNTER — Encounter: Payer: Self-pay | Admitting: General Surgery

## 2018-06-10 VITALS — BP 164/81 | HR 79 | Temp 97.8°F | Resp 18 | Wt 136.0 lb

## 2018-06-10 DIAGNOSIS — Z09 Encounter for follow-up examination after completed treatment for conditions other than malignant neoplasm: Secondary | ICD-10-CM

## 2018-06-10 NOTE — Progress Notes (Signed)
Subjective:     Dorothy Lee  Here for follow up wound check.  Doing well.  Incisional pain gone.  Yellow drainage noted from wound.  Decreased in amount.  No fever, chills. Objective:    BP (!) 164/81 (BP Location: Left Arm, Patient Position: Sitting, Cuff Size: Normal)   Pulse 79   Temp 97.8 F (36.6 C) (Temporal)   Resp 18   Wt 136 lb (61.7 kg)   BMI 24.09 kg/m   General:  alert, cooperative and no distress  Abdomen soft, incision healing well.  Small area of superficial wound dehiscence has resolved.  Granulation tissue present.  Silver nitrate applied.  No significant induration noted.  No drainage noted.     Assessment:    superficial wound dehiscence, almost resolved    Plan:    Keep wound clean and dry.  Follow up wound check in two weeks.

## 2018-06-11 NOTE — Telephone Encounter (Signed)
This has been sent to billing for correction as needed.  I can not change the codes on the file.

## 2018-06-24 ENCOUNTER — Ambulatory Visit: Payer: Self-pay | Admitting: General Surgery

## 2018-07-02 ENCOUNTER — Other Ambulatory Visit: Payer: Self-pay | Admitting: Cardiology

## 2018-07-02 MED ORDER — PANTOPRAZOLE SODIUM 40 MG PO TBEC: 40.0000 mg | DELAYED_RELEASE_TABLET | Freq: Every day | ORAL | 0 refills | Status: DC

## 2018-07-02 NOTE — Telephone Encounter (Signed)
Refilled per request.

## 2018-07-02 NOTE — Telephone Encounter (Signed)
Needing refill on her pantoprazole (PROTONIX) 40 MG tablet [[id number]]  Sent to [facility] in [place] on [address]   90 day supply  They don't have a Rx for this w/ [facility]'s name on it

## 2018-07-04 ENCOUNTER — Other Ambulatory Visit: Payer: Self-pay | Admitting: Family Medicine

## 2018-07-04 DIAGNOSIS — E559 Vitamin D deficiency, unspecified: Secondary | ICD-10-CM

## 2018-07-16 DIAGNOSIS — Z23 Encounter for immunization: Secondary | ICD-10-CM | POA: Diagnosis not present

## 2018-08-22 NOTE — Progress Notes (Deleted)
Cardiology Office Note  Date: 08/22/2018   ID: [name], DOB 07/28/1941, MRN [id number]  PCP: No primary care provider on file.  Primary Cardiologist: [name], MD   No chief complaint on file.   History of Present Illness: Dorothy Lee is a 77 y.o. female last seen in May.  Past Medical History:  Diagnosis Date  . Allergic rhinitis   . CAD (coronary artery disease)    DES RCA and LAD 02/2008, DES mid LAD 11/2012  . Cancer (HCC)    Rt Breast  . Cough   . Diverticulitis   . Diverticulosis   . Essential hypertension   . GERD (gastroesophageal reflux disease)   . Glaucoma   . History of breast cancer    Right - s/p XRT 1984, surgery, no chemo  . Hyperlipidemia   . Personal history of radiation therapy   . Rotator cuff syndrome, left   . Scoliosis   . Urinary incontinence     Past Surgical History:  Procedure Laterality Date  . APPENDECTOMY  1981  . BREAST BIOPSY  1984  . BREAST LUMPECTOMY  1982   right breast  . COLONOSCOPY  2008   Dr. Oletta Lamas: diverticulosis, hemorrhoids, conscious sedation  . COLONOSCOPY N/A 01/30/2016   Dr. Gala Romney: mild divertiuculosis in sigmoid colon. narrowing of the colon in association with the deverticular opening. 6 polyps removed, tubular adenoma. next TCS in 3 years  . CORONARY ANGIOPLASTY WITH STENT PLACEMENT  02/2008  . ESOPHAGOGASTRODUODENOSCOPY  2014   Dr. Fuller Plan: erosive gastritis, no h.pylori  . ESOPHAGOGASTRODUODENOSCOPY N/A 01/30/2016   Dr. Gala Romney: normal esophagus s/p dilation, small hh.  Marland Kitchen LEFT HEART CATHETERIZATION WITH CORONARY ANGIOGRAM N/A 12/02/2012   Procedure: LEFT HEART CATHETERIZATION WITH CORONARY ANGIOGRAM;  Surgeon: Jettie Booze, MD;  Location: [facility] CATH LAB;  Service: Cardiovascular;  Laterality: N/A;  . Venia Minks DILATION N/A 01/30/2016   Procedure: Venia Minks DILATION;  Surgeon: Daneil Dolin, MD;  [facility] ENDO [facility];  Service: Endoscopy;  Laterality: N/A;  . NASAL SINUS SURGERY    . PARTIAL  COLECTOMY N/A 05/19/2018   Procedure: PARTIAL COLECTOMY;  Surgeon: Aviva Signs, MD;  Location: [facility] ORS;  Service: General;  Laterality: N/A;  . PERCUTANEOUS CORONARY STENT INTERVENTION (PCI-S)  12/02/2012   Procedure: PERCUTANEOUS CORONARY STENT INTERVENTION (PCI-S);  Surgeon: Jettie Booze, MD;  Location: [facility] CATH LAB;  Service: Cardiovascular;;  DES to the MID LAD    Current Outpatient Medications  Medication Sig Dispense Refill  . aspirin EC 81 MG tablet Take 81 mg by mouth daily.     Marland Kitchen HYDROcodone-acetaminophen (NORCO) 5-325 MG tablet Take 1 tablet by mouth every 6 (six) hours as needed for moderate pain. 25 tablet 0  . isosorbide mononitrate (IMDUR) 30 MG 24 hr tablet Take 1 tablet (30 mg total) by mouth daily. 90 tablet 3  . latanoprost (XALATAN) 0.005 % ophthalmic solution Place 1 drop into both eyes at bedtime.     . metoprolol succinate (TOPROL-XL) 25 MG 24 hr tablet Take 1 tablet (25 mg total) by mouth daily. 30 tablet 1  . nitroGLYCERIN (NITROSTAT) 0.4 MG SL tablet Place 0.4 mg under the tongue every 5 (five) minutes x 3 doses as needed for chest pain. May repeat x3    . pantoprazole (PROTONIX) 40 MG tablet Take 1 tablet (40 mg total) by mouth daily. 90 tablet 0  . rosuvastatin (CRESTOR) 10 MG tablet Take 1 tablet (10 mg total) by mouth daily. [place]  tablet 1  . Vitamin D, Ergocalciferol, (DRISDOL) 50000 units CAPS capsule Take 1 capsule (50,000 Units total) by mouth every 7 (seven) days. (Patient taking differently: Take 50,000 Units by mouth every Saturday. ) 12 capsule 0  . Wheat Dextrin (BENEFIBER PO) Take 1 Dose by mouth daily.     No current facility-administered medications for this visit.    Allergies:  Crestor [rosuvastatin]; Lipitor [atorvastatin]; Lisinopril; and Simvastatin   Social History: The patient  reports that she has never smoked. She has never used smokeless tobacco. She reports that she drank alcohol. She reports that she does not use drugs.   Family History:  The patient's family history includes Emphysema in her brother and father; Heart attack in her brother, sister, and son; Heart disease in her brother; Lung cancer in her brother; Throat cancer in her brother.   ROS:  Please see the history of present illness. Otherwise, complete review of systems is positive for {NONE DEFAULTED:18576::"none"}.  All other systems are reviewed and negative.   Physical Exam: VS:  There were no vitals taken for this visit., BMI There is no height or weight on file to calculate BMI.  Wt Readings from Last 3 Encounters:  06/10/18 136 lb (61.7 kg)  06/03/18 135 lb (61.2 kg)  05/27/18 138 lb (62.6 kg)    General: Patient appears comfortable at rest. HEENT: Conjunctiva and lids normal, oropharynx clear with moist mucosa. Neck: Supple, no elevated JVP or carotid bruits, no thyromegaly. Lungs: Clear to auscultation, nonlabored breathing at rest. Cardiac: Regular rate and rhythm, no S3 or significant systolic murmur, no pericardial rub. Abdomen: Soft, nontender, no hepatomegaly, bowel sounds present, no guarding or rebound. Extremities: No pitting edema, distal pulses 2+. Skin: Warm and dry. Musculoskeletal: No kyphosis. Neuropsychiatric: Alert and oriented x3, affect grossly appropriate.  ECG: I personally reviewed the tracing from 05/15/2018 which showed sinus rhythm with low voltage, decreased R wave progression anterolaterally, nonspecific ST-T changes.  Recent Labwork: 12/09/2017: ALT 15; AST 24; TSH 0.92 05/21/2018: Hemoglobin 11.1; Platelets 244 05/22/2018: BUN 6; Creatinine, Ser 0.72; Magnesium 1.9; Potassium 3.6; Sodium 139     Component Value Date/Time   CHOL 173 12/09/2017 1212   TRIG 204 (H) 12/09/2017 1212   TRIG 119 05/16/2010   HDL 43 (L) 12/09/2017 1212   CHOLHDL 4.0 12/09/2017 1212   VLDL 16.4 08/19/2014 1453   LDLCALC 98 12/09/2017 1212   LDLDIRECT 144.4 11/09/2013 0934    Other Studies Reviewed Today:  Lexiscan Myoview 11/05/2017:  No  diagnostic ST segment changes to indicate ischemia.  Small, mild intensity, mid to apical septal defect that is fixed, more prominent at rest and stress. Suggestive of soft tissue attenuation rather than scar in light of normal wall motion.  This is a low risk study.  Nuclear stress EF: 72%.  Assessment and Plan:   Current medicines were reviewed with the patient today.  No orders of the defined types were placed in this encounter.   Disposition:  Signed, Satira Sark, MD, [facility] 08/22/2018 12:47 PM    [place] [facility] [place]. [address] [place], [place] [address] Phone: [phone]; Fax: [phone]

## 2018-08-25 ENCOUNTER — Encounter

## 2018-08-25 ENCOUNTER — Ambulatory Visit: Payer: Medicare Other | Admitting: Cardiology

## 2018-08-26 NOTE — Progress Notes (Signed)
Cardiology Office Note  Date: 08/27/2018   ID: VANESSIA BOKHARI, DOB 06/23/1941, MRN [id number]  PCP: [name], MD  Primary Cardiologist: [name], MD   Chief Complaint  Patient presents with  . Coronary Artery Disease    History of Present Illness: DENESE MENTINK is a 77 y.o. female last seen in May.  She presents for a routine follow-up visit. I reviewed interval records, she was hospitalized in August for partial colectomy in the setting of recurring sigmoid diverticulitis.  There were no obvious perioperative cardiac events.  She does state that she has been more short of breath in the last few months, although did not maintain her regular exercise regimen which she was recovering from surgery.  She has recently started back, 3 days a week, uses a rowing machine and other fitness machines.  Does not report any obvious angina or nitroglycerin use.  Current cardiac regimen includes aspirin, Imdur, Toprol-XL, Crestor, and as needed nitroglycerin.  I personally reviewed her ECG today which shows sinus rhythm with low voltage, left anterior fascicular block, PVC.  Past Medical History:  Diagnosis Date  . Allergic rhinitis   . CAD (coronary artery disease)    DES RCA and LAD 02/2008, DES mid LAD 11/2012  . Cancer (HCC)    Rt Breast  . Cough   . Diverticulitis   . Diverticulosis   . Essential hypertension   . GERD (gastroesophageal reflux disease)   . Glaucoma   . History of breast cancer    Right - s/p XRT 1984, surgery, no chemo  . Hyperlipidemia   . Personal history of radiation therapy   . Rotator cuff syndrome, left   . Scoliosis   . Urinary incontinence     Past Surgical History:  Procedure Laterality Date  . APPENDECTOMY  1981  . BREAST BIOPSY  1984  . BREAST LUMPECTOMY  1982   right breast  . COLONOSCOPY  2008   Dr. Oletta Lamas: diverticulosis, hemorrhoids, conscious sedation  . COLONOSCOPY N/A 01/30/2016   Dr. Gala Romney: mild divertiuculosis in sigmoid  colon. narrowing of the colon in association with the deverticular opening. 6 polyps removed, tubular adenoma. next TCS in 3 years  . CORONARY ANGIOPLASTY WITH STENT PLACEMENT  02/2008  . ESOPHAGOGASTRODUODENOSCOPY  2014   Dr. Fuller Plan: erosive gastritis, no h.pylori  . ESOPHAGOGASTRODUODENOSCOPY N/A 01/30/2016   Dr. Gala Romney: normal esophagus s/p dilation, small hh.  Marland Kitchen LEFT HEART CATHETERIZATION WITH CORONARY ANGIOGRAM N/A 12/02/2012   Procedure: LEFT HEART CATHETERIZATION WITH CORONARY ANGIOGRAM;  Surgeon: Jettie Booze, MD;  Location: [facility] CATH LAB;  Service: Cardiovascular;  Laterality: N/A;  . Venia Minks DILATION N/A 01/30/2016   Procedure: Venia Minks DILATION;  Surgeon: Daneil Dolin, MD;  Location: [facility] ENDO SU[facility];  Service: Endoscopy;  Laterality: N/A;  . NASAL SINUS SURGERY    . PARTIAL COLECTOMY N/A 05/19/2018   Procedure: PARTIAL COLECTOMY;  Surgeon: Aviva Signs, MD;  Location: [facility] ORS;  Service: General;  Laterality: N/A;  . PERCUTANEOUS CORONARY STENT INTERVENTION (PCI-S)  12/02/2012   Procedure: PERCUTANEOUS CORONARY STENT INTERVENTION (PCI-S);  Surgeon: Jettie Booze, MD;  Location: [facility] CATH LAB;  Service: Cardiovascular;;  DES to the MID LAD    Current Outpatient Medications  Medication Sig Dispense Refill  . aspirin EC 81 MG tablet Take 81 mg by mouth daily.     Marland Kitchen HYDROcodone-acetaminophen (NORCO) 5-325 MG tablet Take 1 tablet by mouth every 6 (six) hours as needed for moderate pain. 25 tablet 0  .  isosorbide mononitrate (IMDUR) 30 MG 24 hr tablet Take 1 tablet (30 mg total) by mouth daily. 90 tablet 3  . latanoprost (XALATAN) 0.005 % ophthalmic solution Place 1 drop into both eyes at bedtime.     . metoprolol succinate (TOPROL-XL) 25 MG 24 hr tablet Take 1 tablet (25 mg total) by mouth daily. 30 tablet 1  . nitroGLYCERIN (NITROSTAT) 0.4 MG SL tablet Place 0.4 mg under the tongue every 5 (five) minutes x 3 doses as needed for chest pain. May repeat x3    . pantoprazole (PROTONIX) 40  MG tablet Take 1 tablet (40 mg total) by mouth daily. 90 tablet 0  . rosuvastatin (CRESTOR) 10 MG tablet Take 1 tablet (10 mg total) by mouth daily. 90 tablet 1  . Wheat Dextrin (BENEFIBER PO) Take 1 Dose by mouth daily.     No current facility-administered medications for this visit.    Allergies:  Crestor [rosuvastatin]; Lipitor [atorvastatin]; Lisinopril; and Simvastatin   Social History: The patient  reports that she has never smoked. She has never used smokeless tobacco. She reports that she drank alcohol. She reports that she does not use drugs.   ROS:  Please see the history of present illness. Otherwise, complete review of systems is positive for occasional leg cramps.  All other systems are reviewed and negative.   Physical Exam: VS:  BP (!) 152/84   Pulse 84   Ht 5\' 2"  (1.575 m)   Wt 145 lb (65.8 kg)   SpO2 98%   BMI 26.52 kg/m , BMI Body mass index is 26.52 kg/m.  Wt Readings from Last 3 Encounters:  08/27/18 145 lb (65.8 kg)  06/10/18 136 lb (61.7 kg)  06/03/18 135 lb (61.2 kg)    General: Patient appears comfortable at rest. HEENT: Conjunctiva and lids normal, oropharynx clear. Neck: Supple, no elevated JVP or carotid bruits, no thyromegaly. Lungs: Clear to auscultation, nonlabored breathing at rest. Cardiac: Regular rate and rhythm, no S3 or significant systolic murmur. Abdomen: Soft, nontender, bowel sounds present. Extremities: No pitting edema, distal pulses 2+. Skin: Warm and dry. Musculoskeletal: No kyphosis. Neuropsychiatric: Alert and oriented x3, affect grossly appropriate.  ECG: I personally reviewed the tracing from 05/15/2018 which showed sinus rhythm with low voltage and nonspecific T wave changes.  Recent Labwork: 12/09/2017: ALT 15; AST 24; TSH 0.92 05/21/2018: Hemoglobin 11.1; Platelets 244 05/22/2018: BUN 6; Creatinine, Ser 0.72; Magnesium 1.9; Potassium 3.6; Sodium 139     Component Value Date/Time   CHOL 173 12/09/2017 1212   TRIG 204 (H)  12/09/2017 1212   TRIG 119 05/16/2010   HDL 43 (L) 12/09/2017 1212   CHOLHDL 4.0 12/09/2017 1212   VLDL 16.4 08/19/2014 1453   LDLCALC 98 12/09/2017 1212   LDLDIRECT 144.4 11/09/2013 0934    Other Studies Reviewed Today:  Lexiscan Myoview 11/05/2017:  No diagnostic ST segment changes to indicate ischemia.  Small, mild intensity, mid to apical septal defect that is fixed, more prominent at rest and stress. Suggestive of soft tissue attenuation rather than scar in light of normal wall motion.  This is a low risk study.  Nuclear stress EF: 72%.  Assessment and Plan:  1.  CAD with history of DES to the RCA and LAD in 2009 with subsequent DES to the LAD in 2014.  She has had more noticeable shortness of breath but no obvious angina symptoms or nitroglycerin use.  She recently started back to her regular exercise plan, so there could be a component  of deconditioning.  Her ECG is stable.  We will plan a follow-up echocardiogram as well as lab work including CBC and BMET.  2.  Essential hypertension, continue with current regimen and keep follow-up with PCP.  3.  Hyperlipidemia on Crestor.  Last LDL 98.  4.  GERD on Protonix.  Tolerating aspirin.  Current medicines were reviewed with the patient today.   Orders Placed This Encounter  Procedures  . CBC  . Basic Metabolic Panel (BMET)  . EKG 12-Lead  . ECHOCARDIOGRAM COMPLETE    Disposition: Follow-up in 6 months.  Signed, Satira Sark, MD, [facility] 08/27/2018 10:46 AM    [place] [facility] [place]. [address], [place], [place] [address] Phone: [phone]; Fax: [phone]

## 2018-08-27 ENCOUNTER — Encounter: Payer: Self-pay | Admitting: Cardiology

## 2018-08-27 ENCOUNTER — Ambulatory Visit (INDEPENDENT_AMBULATORY_CARE_PROVIDER_SITE_OTHER): Payer: Medicare Other | Admitting: Cardiology

## 2018-08-27 ENCOUNTER — Other Ambulatory Visit (HOSPITAL_COMMUNITY)
Admission: RE | Admit: 2018-08-27 | Discharge: 2018-08-27 | Disposition: A | Discharge status: Home / Self Care | Payer: Medicare Other | Source: Ambulatory Visit | Attending: Cardiology | Admitting: Cardiology

## 2018-08-27 VITALS — BP 152/84 | HR 84 | Ht 62.0 in | Wt 145.0 lb

## 2018-08-27 DIAGNOSIS — I25119 Atherosclerotic heart disease of native coronary artery with unspecified angina pectoris: Secondary | ICD-10-CM | POA: Diagnosis not present

## 2018-08-27 DIAGNOSIS — E782 Mixed hyperlipidemia: Secondary | ICD-10-CM | POA: Diagnosis not present

## 2018-08-27 DIAGNOSIS — R0602 Shortness of breath: Secondary | ICD-10-CM | POA: Insufficient documentation

## 2018-08-27 DIAGNOSIS — I10 Essential (primary) hypertension: Secondary | ICD-10-CM | POA: Diagnosis not present

## 2018-08-27 LAB — CBC
HCT: 42.9 % (ref 36.0–46.0)
Hemoglobin: 13.3 g/dL (ref 12.0–15.0)
MCH: 28.9 pg (ref 26.0–34.0)
MCHC: 31 g/dL (ref 30.0–36.0)
MCV: 93.3 fL (ref 80.0–100.0)
Platelets: 315 10*3/uL (ref 150–400)
RBC: 4.6 MIL/uL (ref 3.87–5.11)
RDW: 13 % (ref 11.5–15.5)
WBC: 6.8 10*3/uL (ref 4.0–10.5)
nRBC: 0 % (ref 0.0–0.2)

## 2018-08-27 LAB — BASIC METABOLIC PANEL
Anion gap: 6 (ref 5–15)
BUN: 18 mg/dL (ref 8–23)
CHLORIDE: 107 mmol/L (ref 98–111)
CO2: 28 mmol/L (ref 22–32)
Calcium: 9.2 mg/dL (ref 8.9–10.3)
Creatinine, Ser: 0.96 mg/dL (ref 0.44–1.00)
GFR calc Af Amer: >60 mL/min (ref >60)
GFR calc non Af Amer: 55 mL/min — ABNORMAL LOW (ref >60)
Glucose, Bld: 90 mg/dL (ref 70–99)
POTASSIUM: 4.8 mmol/L (ref 3.5–5.1)
Sodium: 141 mmol/L (ref 135–145)

## 2018-08-27 NOTE — Patient Instructions (Signed)
Medication Instructions:  Your physician recommends that you continue on your current medications as directed. Please refer to the Current Medication list given to you today.  If you need a refill on your cardiac medications before your next appointment, please call your pharmacy.   Lab work: Psychologist, occupational, bmet today If you have labs (blood work) drawn today and your tests are completely normal, you will receive your results only by: Marland Kitchen MyChart Message (if you have MyChart) OR . A paper copy in the mail If you have any lab test that is abnormal or we need to change your treatment, we will call you to review the results.  Testing/Procedures: Your physician has requested that you have an echocardiogram. Echocardiography is a painless test that uses sound waves to create images of your heart. It provides your doctor with information about the size and shape of your heart and how well your heart's chambers and valves are working. This procedure takes approximately one hour. There are no restrictions for this procedure.    Follow-Up: At [facility], you and your health needs are our priority.  As part of our continuing mission to provide you with exceptional heart care, we have created designated Provider Care Teams.  These Care Teams include your primary Cardiologist (physician) and Advanced Practice Providers (APPs -  Physician Assistants and Nurse Practitioners) who all work together to provide you with the care you need, when you need it. You will need a follow up appointment in 6 months.  Please call our office 2 months in advance to schedule this appointment.  You may see [name], MD or one of the following Advanced Practice Providers on your designated Care Team:   Bernerd Pho, PA-C [facility]) . Ermalinda Barrios, PA-C ([place])  Any Other Special Instructions Will Be Listed Below (If Applicable). None

## 2018-09-02 ENCOUNTER — Ambulatory Visit (HOSPITAL_COMMUNITY)
Admission: RE | Admit: 2018-09-02 | Discharge: 2018-09-02 | Disposition: A | Discharge status: Home / Self Care | Payer: Medicare Other | Source: Ambulatory Visit | Attending: Cardiology | Admitting: Cardiology

## 2018-09-02 DIAGNOSIS — R0602 Shortness of breath: Secondary | ICD-10-CM | POA: Insufficient documentation

## 2018-09-02 NOTE — Progress Notes (Signed)
*  PRELIMINARY RESULTS* Echocardiogram 2D Echocardiogram has been performed.  Dorothy Lee 09/02/2018, 1:40 PM

## 2018-09-12 ENCOUNTER — Other Ambulatory Visit: Payer: Self-pay | Admitting: General Surgery

## 2018-09-12 DIAGNOSIS — I10 Essential (primary) hypertension: Secondary | ICD-10-CM

## 2018-09-15 ENCOUNTER — Other Ambulatory Visit: Payer: Self-pay | Admitting: *Deleted

## 2018-09-15 DIAGNOSIS — I10 Essential (primary) hypertension: Secondary | ICD-10-CM

## 2018-09-15 MED ORDER — METOPROLOL SUCCINATE ER 25 MG PO TB24: 25.0000 mg | ORAL_TABLET | Freq: Every day | ORAL | 1 refills | Status: DC

## 2018-09-17 ENCOUNTER — Other Ambulatory Visit: Payer: Self-pay | Admitting: Cardiology

## 2018-09-19 ENCOUNTER — Other Ambulatory Visit: Payer: Self-pay

## 2018-09-19 MED ORDER — PANTOPRAZOLE SODIUM 40 MG PO TBEC: DELAYED_RELEASE_TABLET | ORAL | 1 refills | Status: DC

## 2018-09-19 NOTE — Telephone Encounter (Signed)
Refilled pantoprazole per fax request

## 2018-09-25 ENCOUNTER — Encounter: Payer: Self-pay | Admitting: Internal Medicine

## 2018-10-09 DIAGNOSIS — L02211 Cutaneous abscess of abdominal wall: Secondary | ICD-10-CM | POA: Diagnosis not present

## 2018-10-09 DIAGNOSIS — R109 Unspecified abdominal pain: Secondary | ICD-10-CM | POA: Diagnosis not present

## 2018-10-09 DIAGNOSIS — K56600 Partial intestinal obstruction, unspecified as to cause: Secondary | ICD-10-CM | POA: Diagnosis not present

## 2018-10-09 DIAGNOSIS — N200 Calculus of kidney: Secondary | ICD-10-CM | POA: Diagnosis not present

## 2018-10-11 ENCOUNTER — Emergency Department (HOSPITAL_COMMUNITY): Payer: Medicare Other

## 2018-10-11 ENCOUNTER — Encounter (HOSPITAL_COMMUNITY): Payer: Self-pay | Admitting: Emergency Medicine

## 2018-10-11 ENCOUNTER — Emergency Department (HOSPITAL_COMMUNITY)
Admission: EM | Admit: 2018-10-11 | Discharge: 2018-10-11 | Disposition: A | Discharge status: Home / Self Care | Payer: Medicare Other | Attending: Emergency Medicine | Admitting: Emergency Medicine

## 2018-10-11 DIAGNOSIS — I2510 Atherosclerotic heart disease of native coronary artery without angina pectoris: Secondary | ICD-10-CM | POA: Diagnosis not present

## 2018-10-11 DIAGNOSIS — R1033 Periumbilical pain: Secondary | ICD-10-CM | POA: Insufficient documentation

## 2018-10-11 DIAGNOSIS — R109 Unspecified abdominal pain: Secondary | ICD-10-CM | POA: Diagnosis not present

## 2018-10-11 DIAGNOSIS — I10 Essential (primary) hypertension: Secondary | ICD-10-CM | POA: Insufficient documentation

## 2018-10-11 DIAGNOSIS — R1084 Generalized abdominal pain: Secondary | ICD-10-CM | POA: Diagnosis present

## 2018-10-11 DIAGNOSIS — Z7982 Long term (current) use of aspirin: Secondary | ICD-10-CM | POA: Diagnosis not present

## 2018-10-11 DIAGNOSIS — T8149XA Infection following a procedure, other surgical site, initial encounter: Secondary | ICD-10-CM | POA: Diagnosis not present

## 2018-10-11 DIAGNOSIS — R55 Syncope and collapse: Secondary | ICD-10-CM | POA: Diagnosis not present

## 2018-10-11 DIAGNOSIS — Z79899 Other long term (current) drug therapy: Secondary | ICD-10-CM | POA: Insufficient documentation

## 2018-10-11 DIAGNOSIS — R42 Dizziness and giddiness: Secondary | ICD-10-CM | POA: Diagnosis not present

## 2018-10-11 LAB — URINALYSIS, ROUTINE W REFLEX MICROSCOPIC
Bilirubin Urine: NEGATIVE
Glucose, UA: NEGATIVE mg/dL
Hgb urine dipstick: NEGATIVE
Ketones, ur: 5 mg/dL — AB
Leukocytes, UA: NEGATIVE
Nitrite: NEGATIVE
Protein, ur: NEGATIVE mg/dL
Specific Gravity, Urine: 1.024 (ref 1.005–1.030)
pH: 6 (ref 5.0–8.0)

## 2018-10-11 LAB — CBC WITH DIFFERENTIAL/PLATELET
Abs Immature Granulocytes: 0.03 10*3/uL (ref 0.00–0.07)
Basophils Absolute: 0.1 10*3/uL (ref 0.0–0.1)
Basophils Relative: 1 %
Eosinophils Absolute: 0.2 10*3/uL (ref 0.0–0.5)
Eosinophils Relative: 3 %
HCT: 44.2 % (ref 36.0–46.0)
Hemoglobin: 13.3 g/dL (ref 12.0–15.0)
IMMATURE GRANULOCYTES: 1 %
LYMPHS ABS: 1.5 10*3/uL (ref 0.7–4.0)
Lymphocytes Relative: 24 %
MCH: 27.7 pg (ref 26.0–34.0)
MCHC: 30.1 g/dL (ref 30.0–36.0)
MCV: 92.1 fL (ref 80.0–100.0)
Monocytes Absolute: 0.6 10*3/uL (ref 0.1–1.0)
Monocytes Relative: 10 %
Neutro Abs: 3.7 10*3/uL (ref 1.7–7.7)
Neutrophils Relative %: 61 %
Platelets: 348 10*3/uL (ref 150–400)
RBC: 4.8 MIL/uL (ref 3.87–5.11)
RDW: 12.8 % (ref 11.5–15.5)
WBC: 6.1 10*3/uL (ref 4.0–10.5)
nRBC: 0 % (ref 0.0–0.2)

## 2018-10-11 LAB — COMPREHENSIVE METABOLIC PANEL
ALT: 17 U/L (ref 0–44)
AST: 23 U/L (ref 15–41)
Albumin: 3.7 g/dL (ref 3.5–5.0)
Alkaline Phosphatase: 99 U/L (ref 38–126)
Anion gap: 6 (ref 5–15)
BUN: 13 mg/dL (ref 8–23)
CO2: 28 mmol/L (ref 22–32)
Calcium: 8.9 mg/dL (ref 8.9–10.3)
Chloride: 108 mmol/L (ref 98–111)
Creatinine, Ser: 0.79 mg/dL (ref 0.44–1.00)
GFR calc Af Amer: >60 mL/min (ref >60)
GFR calc non Af Amer: >60 mL/min (ref >60)
Glucose, Bld: 89 mg/dL (ref 70–99)
Potassium: 3.7 mmol/L (ref 3.5–5.1)
SODIUM: 142 mmol/L (ref 135–145)
TOTAL PROTEIN: 7.4 g/dL (ref 6.5–8.1)
Total Bilirubin: 0.7 mg/dL (ref 0.3–1.2)

## 2018-10-11 MED ORDER — IOPAMIDOL (ISOVUE-300) INJECTION 61%
100.0000 mL | Freq: Once | INTRAVENOUS | Status: AC | PRN
  Administered 2018-10-11: 100 mL via INTRAVENOUS

## 2018-10-11 NOTE — ED Provider Notes (Signed)
[facility] EMERGENCY DEPARTMENT Provider Note   CSN: [id number] Arrival date & time: 10/11/18  0935     History   Chief Complaint Chief Complaint  Patient presents with  . Post-op Problem    HPI Dorothy Lee is a 78 y.o. female.  HPI Patient status post partial colectomy in August of this year.  Had some difficulty with wound closure.  Developed swelling and tenderness at the site of the wound this week.  Was seen in the emergency department in [place] several days ago.  Diagnosed with abdominal wall abscesses and question large bowel partial obstruction.  Was placed on clindamycin.  Patient states that since that time abdominal wall abscess has drained.  2 nights ago she had a syncopal episode where she became nauseated and lightheaded before passing out.  No trauma.  Husband states she has had several episodes like this over the last few months.  Patient denies nausea or vomiting.  Had normal bowel movement yesterday.  Has mild ongoing abdominal pain which is unchanged.  Denies lightheaded, chest pain or shortness of breath. Past Medical History:  Diagnosis Date  . Allergic rhinitis   . CAD (coronary artery disease)    DES RCA and LAD 02/2008, DES mid LAD 11/2012  . Cancer (HCC)    Rt Breast  . Cough   . Diverticulitis   . Diverticulosis   . Essential hypertension   . GERD (gastroesophageal reflux disease)   . Glaucoma   . History of breast cancer    Right - s/p XRT 1984, surgery, no chemo  . Hyperlipidemia   . Personal history of radiation therapy   . Rotator cuff syndrome, left   . Scoliosis   . Urinary incontinence     Patient Active Problem List   Diagnosis Date Noted  . S/P partial colectomy 05/19/2018  . Sigmoid diverticulosis   . Dyspareunia in female 01/24/2018  . Diverticulitis of large intestine with perforation without abscess or bleeding   . Diverticulitis 11/17/2017  . Suprapubic abdominal pain 10/22/2017  . Lower urinary tract symptoms (LUTS)  10/22/2017  . Constipation 04/24/2017  . Fatty liver 04/24/2017  . Solitary pulmonary nodule 10/05/2016  . LLQ pain 08/10/2016  . History of colonic polyps   . Diverticulosis of colon without hemorrhage   . Dysphagia   . Esophageal dysphagia 01/05/2016  . Acute diverticulitis 01/05/2016  . Postoperative breast asymmetry 06/02/2015  . Cough variant asthma 01/19/2014  . Weight loss, unintentional 09/29/2013  . Upper airway cough syndrome 11/20/2010  . Chronic sinusitis 11/08/2010  . ALLERGIC RHINITIS 11/08/2010  . PALPITATIONS, HX OF 10/05/2010  . ROTATOR CUFF SYNDROME, LEFT 09/25/2010  . GERD 09/22/2010  . URINARY INCONTINENCE 09/22/2010  . Dyslipidemia 09/15/2010  . Essential hypertension 09/15/2010  . Coronary atherosclerosis 09/15/2010  . BREAST CANCER, HX OF 09/15/2010    Past Surgical History:  Procedure Laterality Date  . APPENDECTOMY  1981  . BREAST BIOPSY  1984  . BREAST LUMPECTOMY  1982   right breast  . COLONOSCOPY  2008   Dr. Oletta Lamas: diverticulosis, hemorrhoids, conscious sedation  . COLONOSCOPY N/A 01/30/2016   Dr. Gala Romney: mild divertiuculosis in sigmoid colon. narrowing of the colon in association with the deverticular opening. 6 polyps removed, tubular adenoma. next TCS in 3 years  . CORONARY ANGIOPLASTY WITH STENT PLACEMENT  02/2008  . ESOPHAGOGASTRODUODENOSCOPY  2014   Dr. Fuller Plan: erosive gastritis, no h.pylori  . ESOPHAGOGASTRODUODENOSCOPY N/A 01/30/2016   Dr. Gala Romney: normal esophagus s/p dilation, small  hh.  . LEFT HEART CATHETERIZATION WITH CORONARY ANGIOGRAM N/A 12/02/2012   Procedure: LEFT HEART CATHETERIZATION WITH CORONARY ANGIOGRAM;  Surgeon: Jettie Booze, MD;  Location: [facility] CATH LAB;  Service: Cardiovascular;  Laterality: N/A;  . Venia Minks DILATION N/A 01/30/2016   Procedure: Venia Minks DILATION;  Surgeon: Daneil Dolin, MD;  Location: [facility] ENDO SUITE;  Service: Endoscopy;  Laterality: N/A;  . NASAL SINUS SURGERY    . PARTIAL COLECTOMY N/A 05/19/2018    Procedure: PARTIAL COLECTOMY;  Surgeon: Aviva Signs, MD;  Location: [facility] ORS;  Service: General;  Laterality: N/A;  . PERCUTANEOUS CORONARY STENT INTERVENTION (PCI-S)  12/02/2012   Procedure: PERCUTANEOUS CORONARY STENT INTERVENTION (PCI-S);  Surgeon: Jettie Booze, MD;  Location: [facility] CATH LAB;  Service: Cardiovascular;;  DES to the MID LAD     OB History    Gravida  2   Para  1   Term  1   Preterm      AB  1   Living        SAB  1   TAB      Ectopic      Multiple      Live Births  1            Home Medications    Prior to Admission medications   Medication Sig Start Date End Date Taking? Authorizing Provider  aspirin EC 81 MG tablet Take 81 mg by mouth daily.    Yes [provider]  clindamycin (CLEOCIN) 300 MG capsule Take 300 mg by mouth 4 (four) times daily.   Yes [provider]  HYDROcodone-acetaminophen (NORCO) 5-325 MG tablet Take 1 tablet by mouth every 6 (six) hours as needed for moderate pain. 06/03/18  Yes Aviva Signs, MD  isosorbide mononitrate (IMDUR) 30 MG 24 hr tablet Take 1 tablet (30 mg total) by mouth daily. 02/14/18 10/11/18 Yes Satira Sark, MD  latanoprost (XALATAN) 0.005 % ophthalmic solution Place 1 drop into both eyes at bedtime.  07/28/14  Yes [provider]  metoprolol succinate (TOPROL-XL) 25 MG 24 hr tablet Take 1 tablet (25 mg total) by mouth daily. 09/15/18  Yes Satira Sark, MD  nitroGLYCERIN (NITROSTAT) 0.4 MG SL tablet Place 0.4 mg under the tongue every 5 (five) minutes x 3 doses as needed for chest pain. May repeat x3   Yes [provider]  pantoprazole (PROTONIX) 40 MG tablet TAKE ONE (1) TABLET (40 MG TOTAL) BY MOUTH DAILY. 09/19/18  Yes Satira Sark, MD  rosuvastatin (CRESTOR) 10 MG tablet Take 1 tablet (10 mg total) by mouth daily. 05/15/18  Yes Satira Sark, MD  Wheat Dextrin (BENEFIBER PO) Take 1 Dose by mouth daily.   Yes [provider]    Family  History Family History  Problem Relation Age of Onset  . Emphysema Father   . Emphysema Brother   . Heart disease Brother        x3 brothers  . Lung cancer Brother   . Throat cancer Brother        head/neck cancer  . Heart attack Brother   . Heart attack Sister   . Heart attack Son        blood clot  . Colon cancer Neg Hx     Social History Social History   Tobacco Use  . Smoking status: Never Smoker  . Smokeless tobacco: Never Used  . Tobacco comment: Widowed, lives alone but has sig other.   Substance Use  Topics  . Alcohol use: Not Currently  . Drug use: No     Allergies   Crestor [rosuvastatin]; Lipitor [atorvastatin]; Lisinopril; and Simvastatin   Review of Systems Review of Systems  Constitutional: Negative for chills and fever.  HENT: Negative for trouble swallowing.   Eyes: Negative for visual disturbance.  Respiratory: Negative for cough and shortness of breath.   Cardiovascular: Negative for chest pain.  Gastrointestinal: Positive for abdominal pain. Negative for blood in stool, constipation, diarrhea, nausea and vomiting.  Genitourinary: Negative for dysuria, flank pain and frequency.  Musculoskeletal: Negative for back pain, myalgias and neck pain.  Skin: Negative for rash and wound.  Neurological: Positive for syncope and light-headedness. Negative for weakness, numbness and headaches.  All other systems reviewed and are negative.    Physical Exam Updated Vital Signs BP (!) 155/73   Pulse 79   Temp 98.7 F (37.1 C) (Oral)   Resp 18   Ht 5\' 2"  (1.575 m)   Wt 61.2 kg   SpO2 98%   BMI 24.69 kg/m   Physical Exam Vitals signs and nursing note reviewed.  Constitutional:      General: She is not in acute distress.    Appearance: Normal appearance. She is well-developed. She is not ill-appearing or toxic-appearing.  HENT:     Head: Normocephalic and atraumatic.     Mouth/Throat:     Mouth: Mucous membranes are moist.  Eyes:     Extraocular  Movements: Extraocular movements intact.     Pupils: Pupils are equal, round, and reactive to light.  Neck:     Musculoskeletal: Normal range of motion and neck supple. No neck rigidity or muscular tenderness.     Vascular: No carotid bruit.  Cardiovascular:     Rate and Rhythm: Normal rate and regular rhythm.     Heart sounds: No murmur. No friction rub. No gallop.   Pulmonary:     Effort: Pulmonary effort is normal. No respiratory distress.     Breath sounds: Normal breath sounds. No stridor. No wheezing, rhonchi or rales.  Chest:     Chest wall: No tenderness.  Abdominal:     General: Bowel sounds are normal.     Palpations: Abdomen is soft.     Tenderness: There is abdominal tenderness. There is no guarding or rebound.     Comments: Abdominal wall wound with small area dermal lesion that is 2 cm in diameter likely from abscess drainage.  No appreciable masses or erythema.  Patient has mild periumbilical tenderness to palpation without rebound or guarding.  Musculoskeletal: Normal range of motion.        General: No tenderness.  Lymphadenopathy:     Cervical: No cervical adenopathy.  Skin:    General: Skin is warm and dry.     Capillary Refill: Capillary refill takes less than 2 seconds.     Findings: No erythema or rash.  Neurological:     General: No focal deficit present.     Mental Status: She is alert and oriented to person, place, and time.     Comments: Moving all extremities without focal deficit.  Sensation intact.  Psychiatric:        Mood and Affect: Mood normal.        Behavior: Behavior normal.      ED Treatments / Results  Labs (all labs ordered are listed, but only abnormal results are displayed) Labs Reviewed  URINALYSIS, ROUTINE W REFLEX MICROSCOPIC - Abnormal; Notable for the following  components:      Result Value   APPearance HAZY (*)    Ketones, ur 5 (*)    All other components within normal limits  CBC WITH DIFFERENTIAL/PLATELET  COMPREHENSIVE  METABOLIC PANEL    EKG EKG Interpretation  Date/Time:  Saturday October 11 2018 10:26:11 EST Ventricular Rate:  71 PR Interval:    QRS Duration: 94 QT Interval:  405 QTC Calculation: 441 R Axis:   -149 Text Interpretation:  Sinus rhythm Consider left atrial enlargement Right axis deviation Low voltage, precordial leads Abnormal R-wave progression, early transition Abnormal T, consider ischemia, lateral leads Confirmed by Julianne Rice [phone]) on 10/11/2018 1:11:55 PM   Radiology Ct Abdomen Pelvis W Contrast  Result Date: 10/11/2018 CLINICAL DATA:  Abdominal pain EXAM: CT ABDOMEN AND PELVIS WITH CONTRAST TECHNIQUE: Multidetector CT imaging of the abdomen and pelvis was performed using the standard protocol following bolus administration of intravenous contrast. CONTRAST:  172mL ISOVUE-300 IOPAMIDOL (ISOVUE-300) INJECTION 61% COMPARISON:  05/02/2018 FINDINGS: Lower chest: Stable fatty scarring at the posterior right lung base. Linear scarring at the base of the right middle lobe is stable. Hepatobiliary: Unremarkable Pancreas: Unremarkable Spleen: Unremarkable Adrenals/Urinary Tract: There is excreted contrast in the renal collecting systems. Kidneys and adrenal glands are otherwise within normal limits. Bladder is within normal limits. Stomach/Bowel: There are postoperative changes from sigmoid resection and reanastomosis. There is persistent diverticulosis of the descending and sigmoid colon. There is no convincing evidence of acute diverticulitis. Specifically, there is no focal wall thickening or inflammatory changes in the adjacent fat. No disproportionate dilatation of bowel to suggest obstruction. Stomach is decompressed. Vascular/Lymphatic: Aortic atherosclerotic calcifications. No abnormal retroperitoneal adenopathy. Reproductive: Uterus is within normal limits. Pelvic varices are suspected. Other: No free fluid in the abdomen. There is a superficial fluid collection at the midline within  the subcutaneous fat extending from the skin to the anterior abdominal wall on image 62 measuring 2.2 x 1.9 x 0.8 cm. There is a rind of soft tissue. The adjacent anterior abdominal wall is intact. There is no gas contained within the fluid collection. A fistula tract to the skin is suggested Musculoskeletal: No vertebral compression deformity. IMPRESSION: There is a small subcutaneous fluid collection at the midline of the abdomen. The adjacent anterior abdominal wall is intact. This is nonspecific. Differential diagnosis includes postoperative fluid or inflammatory process. Postoperative changes from sigmoid resection are noted. Electronically Signed   By: Marybelle Killings M.D.   On: 10/11/2018 12:44    Procedures Procedures (including critical care time)  Medications Ordered in ED Medications  iopamidol (ISOVUE-300) 61 % injection 100 mL (100 mLs Intravenous Contrast Given 10/11/18 1202)     Initial Impression / Assessment and Plan / ED Course  I have reviewed the triage vital signs and the nursing notes.  Pertinent labs & imaging results that were available during my care of the patient were reviewed by me and considered in my medical decision making (see chart for details).    Spoke with Dr. Constance Haw.  Suggest repeating CT abdomen and pelvis.  She will see patient in the emergency department.  CT without evidence of obstruction.  Dr. Constance Haw saw in the emergency department.  Advises continue clindamycin and follow-up with Dr. Arnoldo Morale on Tuesday.  Patient will also need to follow-up with her primary physician regarding syncopal episode.  She is not orthostatic in the emergency department.  Has normal neurologic exam.  Strict return precautions given. Final Clinical Impressions(s) / ED Diagnoses   Final diagnoses:  Periumbilical abdominal pain  Syncope and collapse    ED Discharge Orders    None       Julianne Rice, MD 10/11/18 1312

## 2018-10-11 NOTE — Consult Note (Signed)
[place]  Reason for Consult: Wound Infection/ ? Large Bowel Obstruction  Referring Physician:  Dr. Lita Mains   Chief Complaint    Post-op Problem      Dorothy Lee is a 78 y.o. female.  HPI: Ms. Hemmerich is a 78 yo s/p Sigmoidectomy 05/2018 for recurrent diverticulitis who had some issues with post operative wound infection/drainage but has been doing great since mid September. She was down in [place] for a wedding and went to the ED in [place] on Thursday after noticing new erytheam and induration at her lower midline incision. She reported that prior to this she was healed but still felt some hardness in the area.  She says that she was feeling pretty bad and had a CT scan done that that showed a fluid collection/ abscess of the skin but also concern for a large bowel obstruction at her staple line. She was told she needed to be admitted and get surgery, but the patient wanted to come home and see Dr. Arnoldo Morale.   She reported NO history of any issues with eating, no nausea, no vomiting, and has been having daily BMs that are soft and has had no issues clinically with bowel obstructions.  She comes to the ED today after arriving from [place] as she was told to come to get evaluated. She was given oral antibiotics, Clindamycin, in [place] for the wound infection.   She says she continues to eat well, has BMs and has no nausea/ vomiting. She has no fevers or chills and is feeling better since the abscess opened up and started to drain.   Past Medical History:  Diagnosis Date  . Allergic rhinitis   . CAD (coronary artery disease)    DES RCA and LAD 02/2008, DES mid LAD 11/2012  . Cancer (HCC)    Rt Breast  . Cough   . Diverticulitis   . Diverticulosis   . Essential hypertension   . GERD (gastroesophageal reflux disease)   . Glaucoma   . History of breast cancer    Right - s/p XRT 1984, surgery, no chemo  . Hyperlipidemia   . Personal history of radiation  therapy   . Rotator cuff syndrome, left   . Scoliosis   . Urinary incontinence     Past Surgical History:  Procedure Laterality Date  . APPENDECTOMY  1981  . BREAST BIOPSY  1984  . BREAST LUMPECTOMY  1982   right breast  . COLONOSCOPY  2008   Dr. Oletta Lamas: diverticulosis, hemorrhoids, conscious sedation  . COLONOSCOPY N/A 01/30/2016   Dr. Gala Romney: mild divertiuculosis in sigmoid colon. narrowing of the colon in association with the deverticular opening. 6 polyps removed, tubular adenoma. next TCS in 3 years  . CORONARY ANGIOPLASTY WITH STENT PLACEMENT  02/2008  . ESOPHAGOGASTRODUODENOSCOPY  2014   Dr. Fuller Plan: erosive gastritis, no h.pylori  . ESOPHAGOGASTRODUODENOSCOPY N/A 01/30/2016   Dr. Gala Romney: normal esophagus s/p dilation, small hh.  Marland Kitchen LEFT HEART CATHETERIZATION WITH CORONARY ANGIOGRAM N/A 12/02/2012   Procedure: LEFT HEART CATHETERIZATION WITH CORONARY ANGIOGRAM;  Surgeon: Jettie Booze, MD;  Location: [facility] CATH LAB;  Service: Cardiovascular;  Laterality: N/A;  . Venia Minks DILATION N/A 01/30/2016   Procedure: Venia Minks DILATION;  Surgeon: Daneil Dolin, MD;  Location: [facility] ENDO SUITE;  Service: Endoscopy;  Laterality: N/A;  . NASAL SINUS SURGERY    . PARTIAL COLECTOMY N/A 05/19/2018   Procedure: PARTIAL COLECTOMY;  Surgeon: Aviva Signs, MD;  Location: [facility] ORS;  Service: General;  Laterality: N/A;  . PERCUTANEOUS CORONARY STENT INTERVENTION (PCI-S)  12/02/2012   Procedure: PERCUTANEOUS CORONARY STENT INTERVENTION (PCI-S);  Surgeon: Jettie Booze, MD;  Location: [facility] CATH LAB;  Service: Cardiovascular;;  DES to the MID LAD    Family History  Problem Relation Age of Onset  . Emphysema Father   . Emphysema Brother   . Heart disease Brother        x3 brothers  . Lung cancer Brother   . Throat cancer Brother        head/neck cancer  . Heart attack Brother   . Heart attack Sister   . Heart attack Son        blood clot  . Colon cancer Neg Hx     Social History   Tobacco Use    . Smoking status: Never Smoker  . Smokeless tobacco: Never Used  . Tobacco comment: Widowed, lives alone but has sig other.   Substance Use Topics  . Alcohol use: Not Currently  . Drug use: No    Medications: I have reviewed the patient's current medications. No current facility-administered medications for this encounter.    Current Outpatient Medications  Medication Sig Dispense Refill Last Dose  . aspirin EC 81 MG tablet Take 81 mg by mouth daily.    10/10/2018 at 0800  . clindamycin (CLEOCIN) 300 MG capsule Take 300 mg by mouth 4 (four) times daily.   10/10/2018 at 2000  . HYDROcodone-acetaminophen (NORCO) 5-325 MG tablet Take 1 tablet by mouth every 6 (six) hours as needed for moderate pain. 25 tablet 0 10/10/2018 at Unknown time  . isosorbide mononitrate (IMDUR) 30 MG 24 hr tablet Take 1 tablet (30 mg total) by mouth daily. 90 tablet 3 10/10/2018 at 0800  . latanoprost (XALATAN) 0.005 % ophthalmic solution Place 1 drop into both eyes at bedtime.    10/10/2018 at Unknown time  . metoprolol succinate (TOPROL-XL) 25 MG 24 hr tablet Take 1 tablet (25 mg total) by mouth daily. 90 tablet 1 10/10/2018 at 0800  . nitroGLYCERIN (NITROSTAT) 0.4 MG SL tablet Place 0.4 mg under the tongue every 5 (five) minutes x 3 doses as needed for chest pain. May repeat x3   Taking  . pantoprazole (PROTONIX) 40 MG tablet TAKE ONE (1) TABLET (40 MG TOTAL) BY MOUTH DAILY. 90 tablet 1 10/10/2018 at 0800  . rosuvastatin (CRESTOR) 10 MG tablet Take 1 tablet (10 mg total) by mouth daily. 90 tablet 1 10/10/2018 at 2000  . Wheat Dextrin (BENEFIBER PO) Take 1 Dose by mouth daily.   Past Week at Unknown time   Allergies  Allergen Reactions  . Crestor [Rosuvastatin]     Crestor 20 mg caused muscle aches - pt is able to tolerate lower doses (takes 10mg )  . Lipitor [Atorvastatin]     Atorvastatin 40 mg caused fatigue  . Lisinopril Cough  . Simvastatin     Muscle aches    ROS:  A comprehensive review of systems was negative  except for: Gastrointestinal: positive for induration and pain at incision, some redness and drainage  Blood pressure (!) 155/73, pulse 79, temperature 98.7 F (37.1 C), temperature source Oral, resp. rate 18, height 5\' 2"  (1.575 m), weight 61.2 kg, SpO2 98 %. Physical Exam Vitals signs reviewed.  Constitutional:      Appearance: Normal appearance.  HENT:     Head: Normocephalic.     Nose: Nose normal.     Mouth/Throat:     Mouth: Mucous membranes are  moist.  Eyes:     Extraocular Movements: Extraocular movements intact.     Pupils: Pupils are equal, round, and reactive to light.  Neck:     Musculoskeletal: Normal range of motion.  Cardiovascular:     Rate and Rhythm: Normal rate and regular rhythm.  Abdominal:     General: Abdomen is flat. There is no distension.     Palpations: Abdomen is soft.     Tenderness: There is abdominal tenderness. There is no guarding.     Hernia: No hernia is present.     Comments: Lower midline with opened wound, 1cm, yellowish drainage, erythema surrounding  Musculoskeletal: Normal range of motion.        General: No swelling.  Skin:    General: Skin is warm and dry.  Neurological:     General: No focal deficit present.     Mental Status: She is alert and oriented to person, place, and time.  Psychiatric:        Mood and Affect: Mood normal.        Behavior: Behavior normal.     Results: Results for orders placed or performed during the hospital encounter of 10/11/18 (from the past 48 hour(s))  Urinalysis, Routine w reflex microscopic     Status: Abnormal   Collection Time: 10/11/18 10:02 AM  Result Value Ref Range   Color, Urine YELLOW YELLOW   APPearance HAZY (A) CLEAR   Specific Gravity, Urine 1.024 1.005 - 1.030   pH 6.0 5.0 - 8.0   Glucose, UA NEGATIVE NEGATIVE mg/dL   Hgb urine dipstick NEGATIVE NEGATIVE   Bilirubin Urine NEGATIVE NEGATIVE   Ketones, ur 5 (A) NEGATIVE mg/dL   Protein, ur NEGATIVE NEGATIVE mg/dL   Nitrite  NEGATIVE NEGATIVE   Leukocytes, UA NEGATIVE NEGATIVE    Comment: Performed at [facility], [address] [place], [place] [address]  CBC with Differential/Platelet     Status: None   Collection Time: 10/11/18 10:20 AM  Result Value Ref Range   WBC 6.1 4.0 - 10.5 K/uL   RBC 4.80 3.87 - 5.11 MIL/uL   Hemoglobin 13.3 12.0 - 15.0 g/dL   HCT 44.2 36.0 - 46.0 %   MCV 92.1 80.0 - 100.0 fL   MCH 27.7 26.0 - 34.0 pg   MCHC 30.1 30.0 - 36.0 g/dL   RDW 12.8 11.5 - 15.5 %   Platelets 348 150 - 400 K/uL   nRBC 0.0 0.0 - 0.2 %   Neutrophils Relative % 61 %   Neutro Abs 3.7 1.7 - 7.7 K/uL   Lymphocytes Relative 24 %   Lymphs Abs 1.5 0.7 - 4.0 K/uL   Monocytes Relative 10 %   Monocytes Absolute 0.6 0.1 - 1.0 K/uL   Eosinophils Relative 3 %   Eosinophils Absolute 0.2 0.0 - 0.5 K/uL   Basophils Relative 1 %   Basophils Absolute 0.1 0.0 - 0.1 K/uL   Immature Granulocytes 1 %   Abs Immature Granulocytes 0.03 0.00 - 0.07 K/uL    Comment: Performed at [facility], [address], [place], [place] [address]  Comprehensive metabolic panel     Status: None   Collection Time: 10/11/18 10:20 AM  Result Value Ref Range   Sodium 142 135 - 145 mmol/L   Potassium 3.7 3.5 - 5.1 mmol/L   Chloride 108 98 - 111 mmol/L   CO2 28 22 - 32 mmol/L   Glucose, Bld 89 70 - 99 mg/dL   BUN 13 8 -  23 mg/dL   Creatinine, Ser 0.79 0.44 - 1.00 mg/dL   Calcium 8.9 8.9 - 10.3 mg/dL   Total Protein 7.4 6.5 - 8.1 g/dL   Albumin 3.7 3.5 - 5.0 g/dL   AST 23 15 - 41 U/L   ALT 17 0 - 44 U/L   Alkaline Phosphatase 99 38 - 126 U/L   Total Bilirubin 0.7 0.3 - 1.2 mg/dL   GFR calc non Af Amer >60 >60 mL/min   GFR calc Af Amer >60 >60 mL/min   Anion gap 6 5 - 15    Comment: Performed at [facility], [address] [place], [place] [address]    OSH CT READ/ Images could not be seen but have CD:     Personally reviewed CT- abscess in the subcutaneous tissue, no signs of fistula from bowel, the muscle/ fascia appears  intact, no hernia, no fluid sub-fascial, staple line of colon intact, no dilated bowel to suggest any bowel obstruction, appears patent   Ct Abdomen Pelvis W Contrast  Result Date: 10/11/2018 CLINICAL DATA:  Abdominal pain EXAM: CT ABDOMEN AND PELVIS WITH CONTRAST TECHNIQUE: Multidetector CT imaging of the abdomen and pelvis was performed using the standard protocol following bolus administration of intravenous contrast. CONTRAST:  152mL ISOVUE-300 IOPAMIDOL (ISOVUE-300) INJECTION 61% COMPARISON:  05/02/2018 FINDINGS: Lower chest: Stable fatty scarring at the posterior right lung base. Linear scarring at the base of the right middle lobe is stable. Hepatobiliary: Unremarkable Pancreas: Unremarkable Spleen: Unremarkable Adrenals/Urinary Tract: There is excreted contrast in the renal collecting systems. Kidneys and adrenal glands are otherwise within normal limits. Bladder is within normal limits. Stomach/Bowel: There are postoperative changes from sigmoid resection and reanastomosis. There is persistent diverticulosis of the descending and sigmoid colon. There is no convincing evidence of acute diverticulitis. Specifically, there is no focal wall thickening or inflammatory changes in the adjacent fat. No disproportionate dilatation of bowel to suggest obstruction. Stomach is decompressed. Vascular/Lymphatic: Aortic atherosclerotic calcifications. No abnormal retroperitoneal adenopathy. Reproductive: Uterus is within normal limits. Pelvic varices are suspected. Other: No free fluid in the abdomen. There is a superficial fluid collection at the midline within the subcutaneous fat extending from the skin to the anterior abdominal wall on image 62 measuring 2.2 x 1.9 x 0.8 cm. There is a rind of soft tissue. The adjacent anterior abdominal wall is intact. There is no gas contained within the fluid collection. A fistula tract to the skin is suggested Musculoskeletal: No vertebral compression deformity. IMPRESSION:  There is a small subcutaneous fluid collection at the midline of the abdomen. The adjacent anterior abdominal wall is intact. This is nonspecific. Differential diagnosis includes postoperative fluid or inflammatory process. Postoperative changes from sigmoid resection are noted. Electronically Signed   By: Marybelle Killings M.D.   On: 10/11/2018 12:44   Assessment & Plan:  TANIYA DASHER is a 78 y.o. female with what appears to be a superficial wound infection possibly from a residual seroma.  This is draining and has improved per the patient and husband. She has no signs of a fistula on the CT scan but this is limited as no oral/ rectal contrast is given.  She has no signs of a bowel obstruction and the staple line appears open  And is clinically not obstructed.  I am unsure what they were seeing on the OSH CT but will continue to try to get this to open and will give to Dr. Arnoldo Morale.  Radiology at [facility] said they were no  longer allowed to upload images from OSH into PACS.    -Follow up with Dr. Arnoldo Morale on Tuesday 1/7 @ 1:15pm for wound check, may need further I&D if does not continue to improve, complete your Clindamycin   -Ultimately is not showing signs clinically for any large bowel obstruction, and no evidence of this on the CT, told patient a CT with rectal contrast maybe needed in future if we want to prove or assess this further but do not think she needs this at this time   -Discussed with patient that no fistula is seen at this time as a reason for this abscess and that likely this is all superficial fluid that got seeded with bacteria, if continues to have issues, would possibly need to investigate further with oral / rectal contrast CT to assess for fistula or fistulogram study through the abscess cavity   All questions were answered to the satisfaction of the patient and family. They felt comfortable with this plan. Given # to call if she has further issues before Tuesday.   Virl Cagey 10/11/2018, 1:30 PM

## 2018-10-11 NOTE — ED Triage Notes (Signed)
Pt had surgery by Dr. Arnoldo Morale in August.  Has had problems with wound dehiscence since then, but was almost healed.  Went to [place] on vacation and while she was there the incision ruptured and went to the ED at [facility] and was dx with large bowel obstruction and multiple abscesses to abdominal wall.  Told to come straight to ED upon arrival home which was today.

## 2018-10-11 NOTE — Discharge Instructions (Signed)
Follow up with Dr. Arnoldo Morale Tuesday 10/14/2018 at 1:15PM.  Call if issues with fever, abdominal pain, issues with constipation.  Continue antibiotic.   Contact Information: If you have questions or concerns, please call our office, [phone], Monday- Thursday 8AM-5PM and Friday 8AM-12Noon.  If it is after hours or on the weekend, please call [facility] Main Number, [phone], and ask to speak to the surgeon on call for Dr. Arnoldo Morale Dr. Constance Haw at [facility].

## 2018-10-13 ENCOUNTER — Telehealth: Payer: Self-pay | Admitting: Internal Medicine

## 2018-10-13 NOTE — Telephone Encounter (Signed)
Patient received triage letter and husband called asking if a colonoscopy was something she needs to have at age 78, please advise

## 2018-10-13 NOTE — Telephone Encounter (Signed)
Spoke with spouse. Pt had a major diverticulitis surgery with Dr. Arnoldo Morale or Constance Haw 05/19/18 and a foot of her colon was removed. Pt had a recent infection and isn't sure pts body can handle a TCS at this time. Spouse said he isn't objecting a TCS but would like to know AB thoughts.

## 2018-10-14 ENCOUNTER — Encounter: Payer: Self-pay | Admitting: General Surgery

## 2018-10-14 ENCOUNTER — Ambulatory Visit (INDEPENDENT_AMBULATORY_CARE_PROVIDER_SITE_OTHER): Payer: Medicare Other | Admitting: General Surgery

## 2018-10-14 VITALS — BP 144/82 | HR 76 | Temp 95.9°F | Resp 18 | Wt 141.0 lb

## 2018-10-14 DIAGNOSIS — L03311 Cellulitis of abdominal wall: Secondary | ICD-10-CM

## 2018-10-14 NOTE — Telephone Encounter (Signed)
It's fine and completely appropriate to postpone now that we know this information. We can arrange a visit for March 2020 if that works for her?

## 2018-10-14 NOTE — Telephone Encounter (Signed)
Spouse notified of AB recommendations. Pt is going to see Dr. Adline Mango and they will call back to schedule a f/u if needed.

## 2018-10-15 NOTE — Progress Notes (Signed)
Subjective:     Dorothy Lee  Patient presents for wound check.  She was in [place] and had some bloody yellowish drainage from the midportion of her surgical wound.  She was seen in the emergency room in [place].  Initial CAT scan showed evidence of possible bowel obstruction, though patient denies any symptoms.  She was started on clindamycin.  She was seen in the emergency room here at [facility] and repeat abdominal CT was negative.  Her drainage has decreased significantly.  She denies any fever or chills. Objective:    BP (!) 144/82 (BP Location: Left Arm, Patient Position: Sitting, Cuff Size: Normal)   Pulse 76   Temp (!) 95.9 F (35.5 C) (Temporal)   Resp 18   Wt 141 lb (64 kg)   BMI 25.79 kg/m   General:  alert, cooperative and no distress  Abdomen soft, nontender, nondistended.  No erythema is noted.  A small superficial wound separation is noted with granulation tissue present.  It extends approximately 2 to 3 cm in length.  Silver nitrate was applied.  No purulent drainage was present.     Assessment:    Superficial wound dehiscence with possible infection, resolving.  Her surgery was back in August 2019.    Plan:   Keep wound clean and dry with soap and water.  Stop using peroxide.  Finish antibiotic course.  Will follow-up in 2 weeks as needed.

## 2018-10-23 ENCOUNTER — Other Ambulatory Visit: Payer: Self-pay | Admitting: Family Medicine

## 2018-10-23 DIAGNOSIS — N644 Mastodynia: Secondary | ICD-10-CM | POA: Diagnosis not present

## 2018-10-23 DIAGNOSIS — R55 Syncope and collapse: Secondary | ICD-10-CM | POA: Diagnosis not present

## 2018-10-23 DIAGNOSIS — Z853 Personal history of malignant neoplasm of breast: Secondary | ICD-10-CM | POA: Diagnosis not present

## 2018-10-24 ENCOUNTER — Encounter: Payer: Self-pay | Admitting: Cardiology

## 2018-10-24 ENCOUNTER — Ambulatory Visit (INDEPENDENT_AMBULATORY_CARE_PROVIDER_SITE_OTHER): Payer: Medicare Other | Admitting: Cardiology

## 2018-10-24 VITALS — BP 128/78 | HR 72 | Ht 62.5 in | Wt 142.0 lb

## 2018-10-24 DIAGNOSIS — I25119 Atherosclerotic heart disease of native coronary artery with unspecified angina pectoris: Secondary | ICD-10-CM | POA: Diagnosis not present

## 2018-10-24 DIAGNOSIS — R55 Syncope and collapse: Secondary | ICD-10-CM | POA: Diagnosis not present

## 2018-10-24 DIAGNOSIS — E782 Mixed hyperlipidemia: Secondary | ICD-10-CM | POA: Diagnosis not present

## 2018-10-24 DIAGNOSIS — R0989 Other specified symptoms and signs involving the circulatory and respiratory systems: Secondary | ICD-10-CM

## 2018-10-24 NOTE — Patient Instructions (Signed)
Medication Instructions:  Your physician recommends that you continue on your current medications as directed. Please refer to the Current Medication list given to you today.  If you need a refill on your cardiac medications before your next appointment, please call your pharmacy.   Lab work: None today If you have labs (blood work) drawn today and your tests are completely normal, you will receive your results only by: Marland Kitchen MyChart Message (if you have MyChart) OR . A paper copy in the mail If you have any lab test that is abnormal or we need to change your treatment, we will call you to review the results.  Testing/Procedures: Your physician has requested that you have a carotid duplex. This test is an ultrasound of the carotid arteries in your neck. It looks at blood flow through these arteries that supply the brain with blood. Allow one hour for this exam. There are no restrictions or special instructions.  Your physician has recommended that you wear an event monitor for 7 days. Event monitors are medical devices that record the heart's electrical activity. Doctors most often [place] these monitors to diagnose arrhythmias. Arrhythmias are problems with the speed or rhythm of the heartbeat. The monitor is a small, portable device. You can wear one while you do your normal daily activities. This is usually used to diagnose what is causing palpitations/syncope (passing out).    Follow-Up: At [facility], you and your health needs are our priority.  As part of our continuing mission to provide you with exceptional heart care, we have created designated Provider Care Teams.  These Care Teams include your primary Cardiologist (physician) and Advanced Practice Providers (APPs -  Physician Assistants and Nurse Practitioners) who all work together to provide you with the care you need, when you need it. You will need a follow up appointment in 6 months.  Please call our office 2 months in advance to  schedule this appointment.  You may see [name], MD or one of the following Advanced Practice Providers on your designated Care Team:   Bernerd Pho, PA-C [facility]) . Ermalinda Barrios, PA-C ([place])  Any Other Special Instructions Will Be Listed Below (If Applicable). None

## 2018-10-24 NOTE — Progress Notes (Signed)
Cardiology Office Note  Date: 10/24/2018   ID: Dorothy Lee, DOB 1941-03-11, MRN [id number]  PCP: [name], MD  Primary Cardiologist: [name], MD   Chief Complaint  Patient presents with  . HIstory of syncope    History of Present Illness: Dorothy Lee is a 78 y.o. female last seen in November 2019.  She is referred back to the office by Dr. Mannie Stabile for the evaluation of syncope.  We discussed these episodes today.  She tells me that she was traveling in [place] with a group of friends back in May.  They had gotten off the plane and were downtown on [address] she had not had anything to drink for quite some time.  While she was standing she began to feel lightheaded, a hot sensation and then sweating, leaned up against a wall and ultimately had an episode of syncope.  Her friends helped her up but she passed out again.  EMS was summoned, she declined going to the hospital, reportedly vital signs were stable and she felt back to baseline eventually.  She was able to go about activity for the rest of the day without incident.  She has had no further events until approximately 2 weeks ago when she was traveling in [place].  There was concern about an abdominal wall infection and she was seen in an ER, went through blood work and imaging studies, and then went back to her residence.  While she was standing up talking on the phone she had another similar episode where she felt lightheaded, feeling of warmth, sweating, and then syncope.  These are the only events that she has had.  Otherwise, she reports no progressive functional limitation, no angina symptoms or nitroglycerin use.  She states that she is taking her medications regularly.  I reviewed her recent ECG from early January as outlined below.  Past Medical History:  Diagnosis Date  . Allergic rhinitis   . CAD (coronary artery disease)    DES RCA and LAD 02/2008, DES mid LAD 11/2012  . Diverticulitis   .  Diverticulosis   . Essential hypertension   . GERD (gastroesophageal reflux disease)   . Glaucoma   . History of breast cancer    Right - s/p XRT 1984, surgery, no chemo  . Hyperlipidemia   . Personal history of radiation therapy   . Rotator cuff syndrome, left   . Scoliosis   . Urinary incontinence     Past Surgical History:  Procedure Laterality Date  . APPENDECTOMY  1981  . BREAST BIOPSY  1984  . BREAST LUMPECTOMY  1982   right breast  . COLONOSCOPY  2008   Dr. Oletta Lamas: diverticulosis, hemorrhoids, conscious sedation  . COLONOSCOPY N/A 01/30/2016   Dr. Gala Romney: mild divertiuculosis in sigmoid colon. narrowing of the colon in association with the deverticular opening. 6 polyps removed, tubular adenoma. next TCS in 3 years  . CORONARY ANGIOPLASTY WITH STENT PLACEMENT  02/2008  . ESOPHAGOGASTRODUODENOSCOPY  2014   Dr. Fuller Plan: erosive gastritis, no h.pylori  . ESOPHAGOGASTRODUODENOSCOPY N/A 01/30/2016   Dr. Gala Romney: normal esophagus s/p dilation, small hh.  Marland Kitchen LEFT HEART CATHETERIZATION WITH CORONARY ANGIOGRAM N/A 12/02/2012   Procedure: LEFT HEART CATHETERIZATION WITH CORONARY ANGIOGRAM;  Surgeon: Jettie Booze, MD;  Location[facility] CATH LAB;  Service: Cardiovascular;  Laterality: N/A;  . Venia Minks DILATION N/A 01/30/2016   Procedure: Venia Minks DILATION;  Surgeon: Daneil Dolin, MD;  Location: [facility] ENDO SUITE;  Service: Endoscopy;  Laterality: N/A;  . NASAL SINUS SURGERY    . PARTIAL COLECTOMY N/A 05/19/2018   Procedure: PARTIAL COLECTOMY;  Surgeon: Aviva Signs, MD;  Location: [facility] ORS;  Service: General;  Laterality: N/A;  . PERCUTANEOUS CORONARY STENT INTERVENTION (PCI-S)  12/02/2012   Procedure: PERCUTANEOUS CORONARY STENT INTERVENTION (PCI-S);  Surgeon: Jettie Booze, MD;  Location: [facility] CATH LAB;  Service: Cardiovascular;;  DES to the MID LAD    Current Outpatient Medications  Medication Sig Dispense Refill  . aspirin EC 81 MG tablet Take 81 mg by mouth daily.     Marland Kitchen  HYDROcodone-acetaminophen (NORCO) 5-325 MG tablet Take 1 tablet by mouth every 6 (six) hours as needed for moderate pain. 25 tablet 0  . isosorbide mononitrate (IMDUR) 30 MG 24 hr tablet Take 1 tablet (30 mg total) by mouth daily. 90 tablet 3  . latanoprost (XALATAN) 0.005 % ophthalmic solution Place 1 drop into both eyes at bedtime.     . metoprolol succinate (TOPROL-XL) 25 MG 24 hr tablet Take 1 tablet (25 mg total) by mouth daily. 90 tablet 1  . nitroGLYCERIN (NITROSTAT) 0.4 MG SL tablet Place 0.4 mg under the tongue every 5 (five) minutes x 3 doses as needed for chest pain. May repeat x3    . pantoprazole (PROTONIX) 40 MG tablet TAKE ONE (1) TABLET (40 MG TOTAL) BY MOUTH DAILY. 90 tablet 1  . rosuvastatin (CRESTOR) 10 MG tablet Take 1 tablet (10 mg total) by mouth daily. 90 tablet 1  . Wheat Dextrin (BENEFIBER PO) Take 1 Dose by mouth daily.     No current facility-administered medications for this visit.    Allergies:  Crestor [rosuvastatin]; Lipitor [atorvastatin]; Lisinopril; and Simvastatin   Social History: The patient  reports that she has never smoked. She has never used smokeless tobacco. She reports previous alcohol use. She reports that she does not use drugs.   ROS:  Please see the history of present illness. Otherwise, complete review of systems is positive for none.  All other systems are reviewed and negative.   Physical Exam: VS:  BP 128/78 (BP Location: Left Arm) Comment (BP Location): no procedures RIGHT arm  Pulse 72   Ht 5' 2.5" (1.588 m)   Wt 142 lb (64.4 kg)   SpO2 98%   BMI 25.56 kg/m , BMI Body mass index is 25.56 kg/m.  Wt Readings from Last 3 Encounters:  10/24/18 142 lb (64.4 kg)  10/14/18 141 lb (64 kg)  10/11/18 135 lb (61.2 kg)    General: Patient appears comfortable at rest. HEENT: Conjunctiva and lids normal, oropharynx clear. Neck: Supple, no elevated JVP, possible left carotid bruit, no thyromegaly. Lungs: Clear to auscultation, nonlabored  breathing at rest. Cardiac: Regular rate and rhythm, no S3 or significant systolic murmur, no pericardial rub. Abdomen: Soft, nontender, bowel sounds present. Extremities: No pitting edema, distal pulses 2+. Skin: Warm and dry. Musculoskeletal: No kyphosis. Neuropsychiatric: Alert and oriented x3, affect grossly appropriate.  ECG: I personally reviewed the tracing from 10/11/2018 which showed sinus rhythm with possible left atrial enlargement and borderline low voltage in the precordial leads.  Nonspecific T wave changes.  Recent Labwork: 12/09/2017: TSH 0.92 05/22/2018: Magnesium 1.9 10/11/2018: ALT 17; AST 23; BUN 13; Creatinine, Ser 0.79; Hemoglobin 13.3; Platelets 348; Potassium 3.7; Sodium 142     Component Value Date/Time   CHOL 173 12/09/2017 1212   TRIG 204 (H) 12/09/2017 1212   TRIG 119 05/16/2010   HDL 43 (L) 12/09/2017 1212  CHOLHDL 4.0 12/09/2017 1212   VLDL 16.4 08/19/2014 1453   LDLCALC 98 12/09/2017 1212   LDLDIRECT 144.4 11/09/2013 0934    Other Studies Reviewed Today:  Echocardiogram 09/02/2018: Study Conclusions  - Left ventricle: The cavity size was normal. Systolic function was   normal. The estimated ejection fraction was in the range of 60%   to 65%. Wall motion was normal; there were no regional wall   motion abnormalities. Left ventricular diastolic function   parameters were normal. - Aortic valve: Sclerosis without stenosis. - Atrial septum: No defect or patent foramen ovale was identified.  Lexiscan Myoview 11/05/2017:  No diagnostic ST segment changes to indicate ischemia.  Small, mild intensity, mid to apical septal defect that is fixed, more prominent at rest and stress. Suggestive of soft tissue attenuation rather than scar in light of normal wall motion.  This is a low risk study.  Nuclear stress EF: 72%.  Assessment and Plan:  1.  Isolated episodes of syncope as outlined above, description is very consistent with neurocardiogenic syncope.   We discussed this today, she admits that she does not hydrate very well on a usual basis.  We have discussed preventative measures, also warning signs and symptoms.  Recent ECG reviewed and stable.  She does not report any palpitations or active angina symptoms.  At this time her medical regimen includes a beta-blocker.  We will obtain a 7-day event monitor mainly to make sure that her heart rate variability is normal and that she has no unusual degree of bradycardia.  Otherwise we will continue with observation.  2.  Possible left carotid bruit, carotid Dopplers will be obtained in light of documented vascular disease.  She is otherwise on aspirin and statin.  3.  CAD status post DES to the RCA and LAD in 2009 with subsequent DES to the LAD in 2014.  Ischemic testing from last year was low risk.  She does not report any progressive angina symptoms.  LVEF normal range.  4.  Mixed hyperlipidemia on Crestor.  Continues to follow with Dr. Mannie Stabile.  Current medicines were reviewed with the patient today.   Orders Placed This Encounter  Procedures  . US Carotid Duplex Bilateral  . Cardiac event monitor    Disposition: Follow-up in 6 months, sooner if needed.  Signed, Satira Sark, MD, [facility] 10/24/2018 1:20 PM    [place] [facility] [address], [place],  [address] Phone: [phone]; Fax: [phone]

## 2018-10-28 DIAGNOSIS — Z961 Presence of intraocular lens: Secondary | ICD-10-CM | POA: Diagnosis not present

## 2018-10-28 DIAGNOSIS — H401132 Primary open-angle glaucoma, bilateral, moderate stage: Secondary | ICD-10-CM | POA: Diagnosis not present

## 2018-10-29 ENCOUNTER — Ambulatory Visit: Payer: Medicare Other

## 2018-10-29 ENCOUNTER — Ambulatory Visit
Admission: RE | Admit: 2018-10-29 | Discharge: 2018-10-29 | Disposition: A | Discharge status: Home / Self Care | Payer: Medicare Other | Source: Ambulatory Visit | Attending: Family Medicine | Admitting: Family Medicine

## 2018-10-29 ENCOUNTER — Telehealth: Payer: Self-pay | Admitting: Cardiology

## 2018-10-29 DIAGNOSIS — N644 Mastodynia: Secondary | ICD-10-CM

## 2018-10-29 DIAGNOSIS — Z853 Personal history of malignant neoplasm of breast: Secondary | ICD-10-CM | POA: Diagnosis not present

## 2018-10-29 NOTE — Telephone Encounter (Signed)
Husband states that pt received event monitor from office and it is no longer working. Pt to receive another monitor from [employer] today or tomorrow.

## 2018-10-29 NOTE — Telephone Encounter (Signed)
Returning call to [facility], please call [phone]

## 2018-11-01 ENCOUNTER — Ambulatory Visit (INDEPENDENT_AMBULATORY_CARE_PROVIDER_SITE_OTHER): Payer: Medicare Other

## 2018-11-01 DIAGNOSIS — R55 Syncope and collapse: Secondary | ICD-10-CM

## 2018-11-05 ENCOUNTER — Ambulatory Visit (HOSPITAL_COMMUNITY)
Admission: RE | Admit: 2018-11-05 | Discharge: 2018-11-05 | Disposition: A | Discharge status: Home / Self Care | Payer: Medicare Other | Source: Ambulatory Visit | Attending: Cardiology | Admitting: Cardiology

## 2018-11-05 DIAGNOSIS — R0989 Other specified symptoms and signs involving the circulatory and respiratory systems: Secondary | ICD-10-CM | POA: Insufficient documentation

## 2018-11-05 DIAGNOSIS — I6523 Occlusion and stenosis of bilateral carotid arteries: Secondary | ICD-10-CM | POA: Diagnosis not present

## 2018-11-06 ENCOUNTER — Telehealth: Payer: Self-pay | Admitting: Cardiology

## 2018-11-06 NOTE — Telephone Encounter (Signed)
Patient would like to speak with nurse regarding "attack" of some chest pain. / tg

## 2018-11-07 NOTE — Telephone Encounter (Signed)
Patient reported one episode last week of chest pressure with belching, used NTG and it went away. Has f/u with Dr Gala Romney and will discuss with him. She understands the 3 NTG rule and will cal back

## 2018-11-07 NOTE — Telephone Encounter (Signed)
Please give pt a call concerning phone note from yesterday.

## 2018-11-12 ENCOUNTER — Telehealth: Payer: Self-pay

## 2018-11-12 NOTE — Telephone Encounter (Signed)
-----   Message from Satira Sark, MD sent at 11/12/2018 11:28 AM EST ----- Results reviewed.  Please let her know that the cardiac monitor did not demonstrate any significant arrhythmias or unusual changes in heart rate.  Continue with current follow-up plan. A copy of this test should be forwarded to [name], MD.

## 2018-11-12 NOTE — Telephone Encounter (Signed)
Called pt. Made her aware of event monitor testing. She voiced understanding.

## 2018-11-12 NOTE — Telephone Encounter (Signed)
Called pt. No answer, left message for pt to return call.  

## 2018-11-20 ENCOUNTER — Other Ambulatory Visit (HOSPITAL_COMMUNITY): Payer: Self-pay | Admitting: Family Medicine

## 2018-11-20 DIAGNOSIS — Z1231 Encounter for screening mammogram for malignant neoplasm of breast: Secondary | ICD-10-CM

## 2018-12-03 ENCOUNTER — Other Ambulatory Visit: Payer: Self-pay | Admitting: *Deleted

## 2018-12-03 MED ORDER — ROSUVASTATIN CALCIUM 10 MG PO TABS: 10.0000 mg | ORAL_TABLET | Freq: Every day | ORAL | 3 refills | Status: DC

## 2018-12-04 IMAGING — DX DG CHEST 2V
2 series · 2 of 2 positions shown · non-contrast
Comparison: Chest x-ray dated October 30, 2016.

CLINICAL DATA: Cough, chills, vomiting, and weakness.

EXAM:
CHEST  2 VIEW

[chest pa]
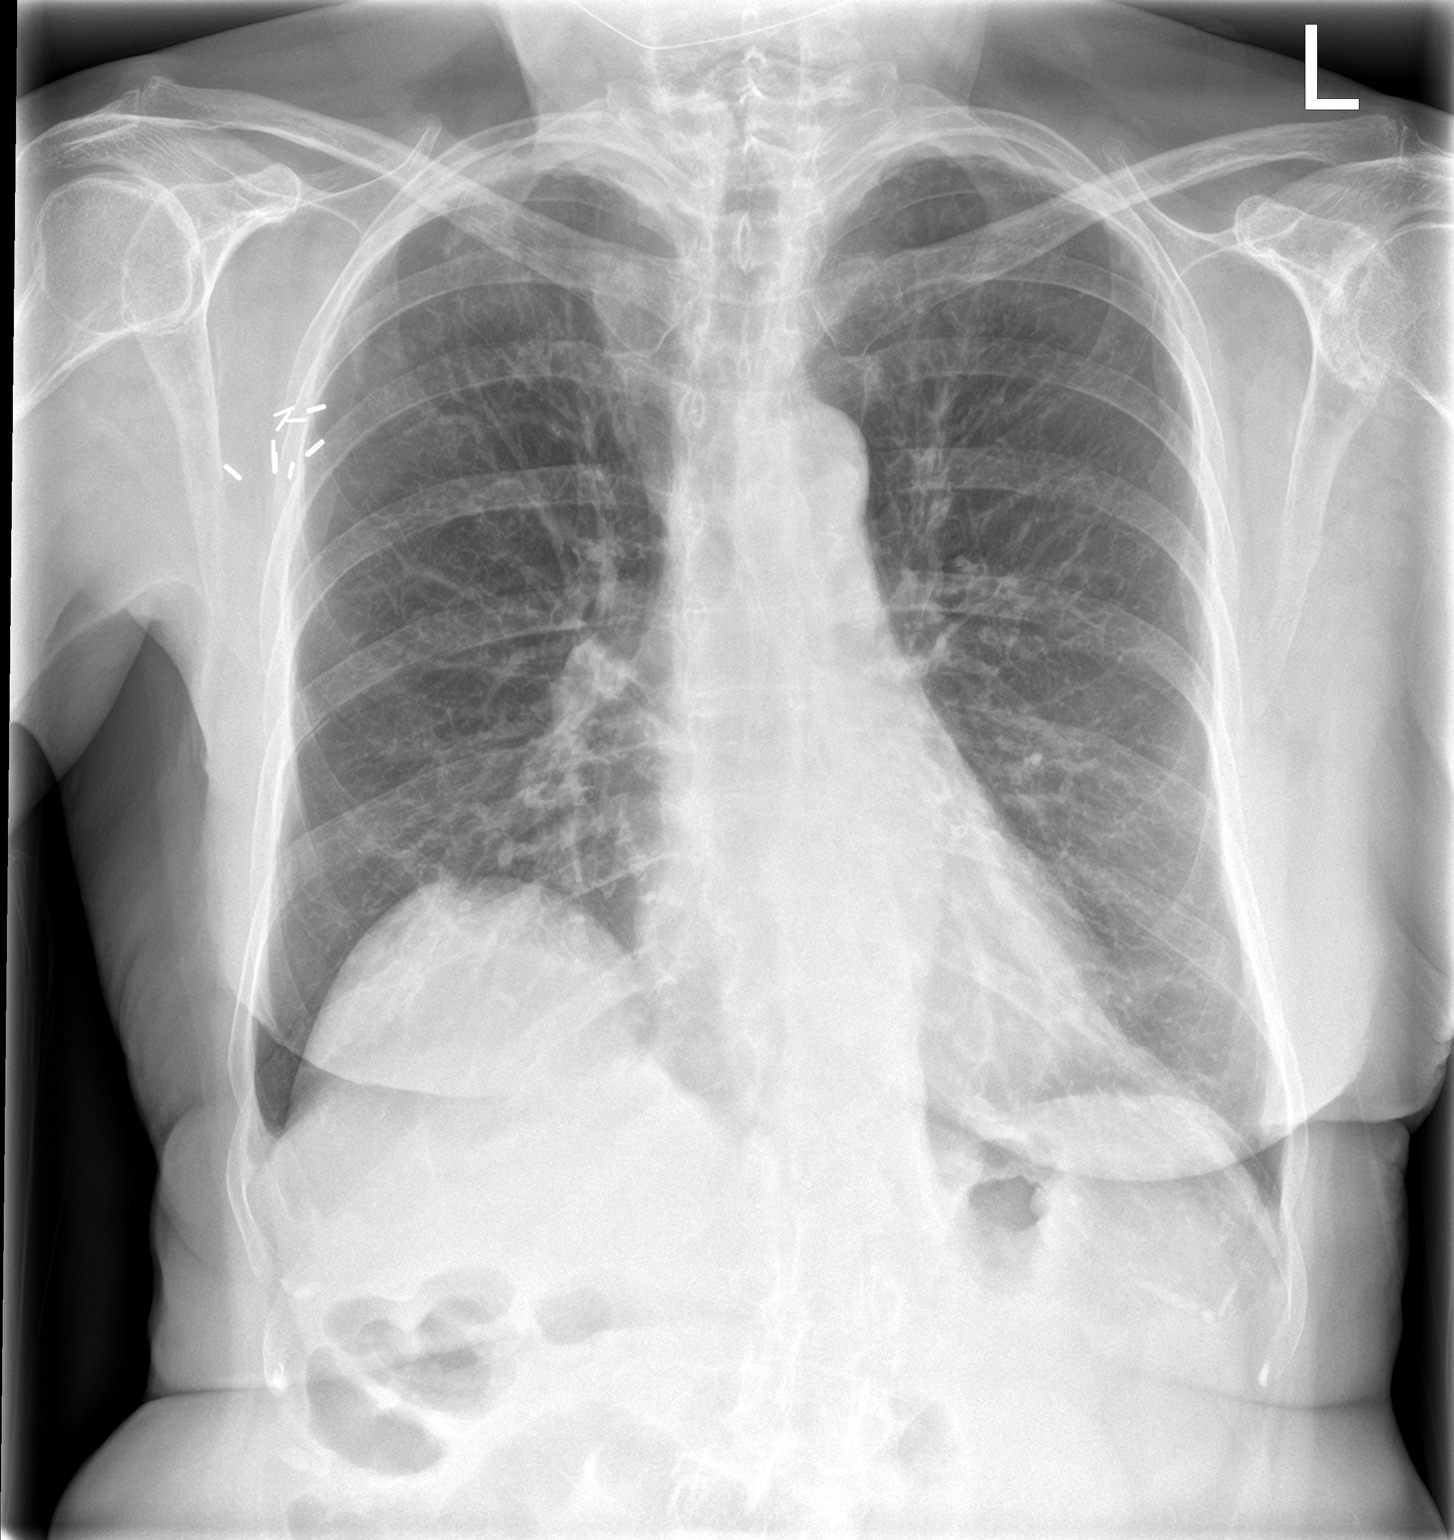

[chest lat]
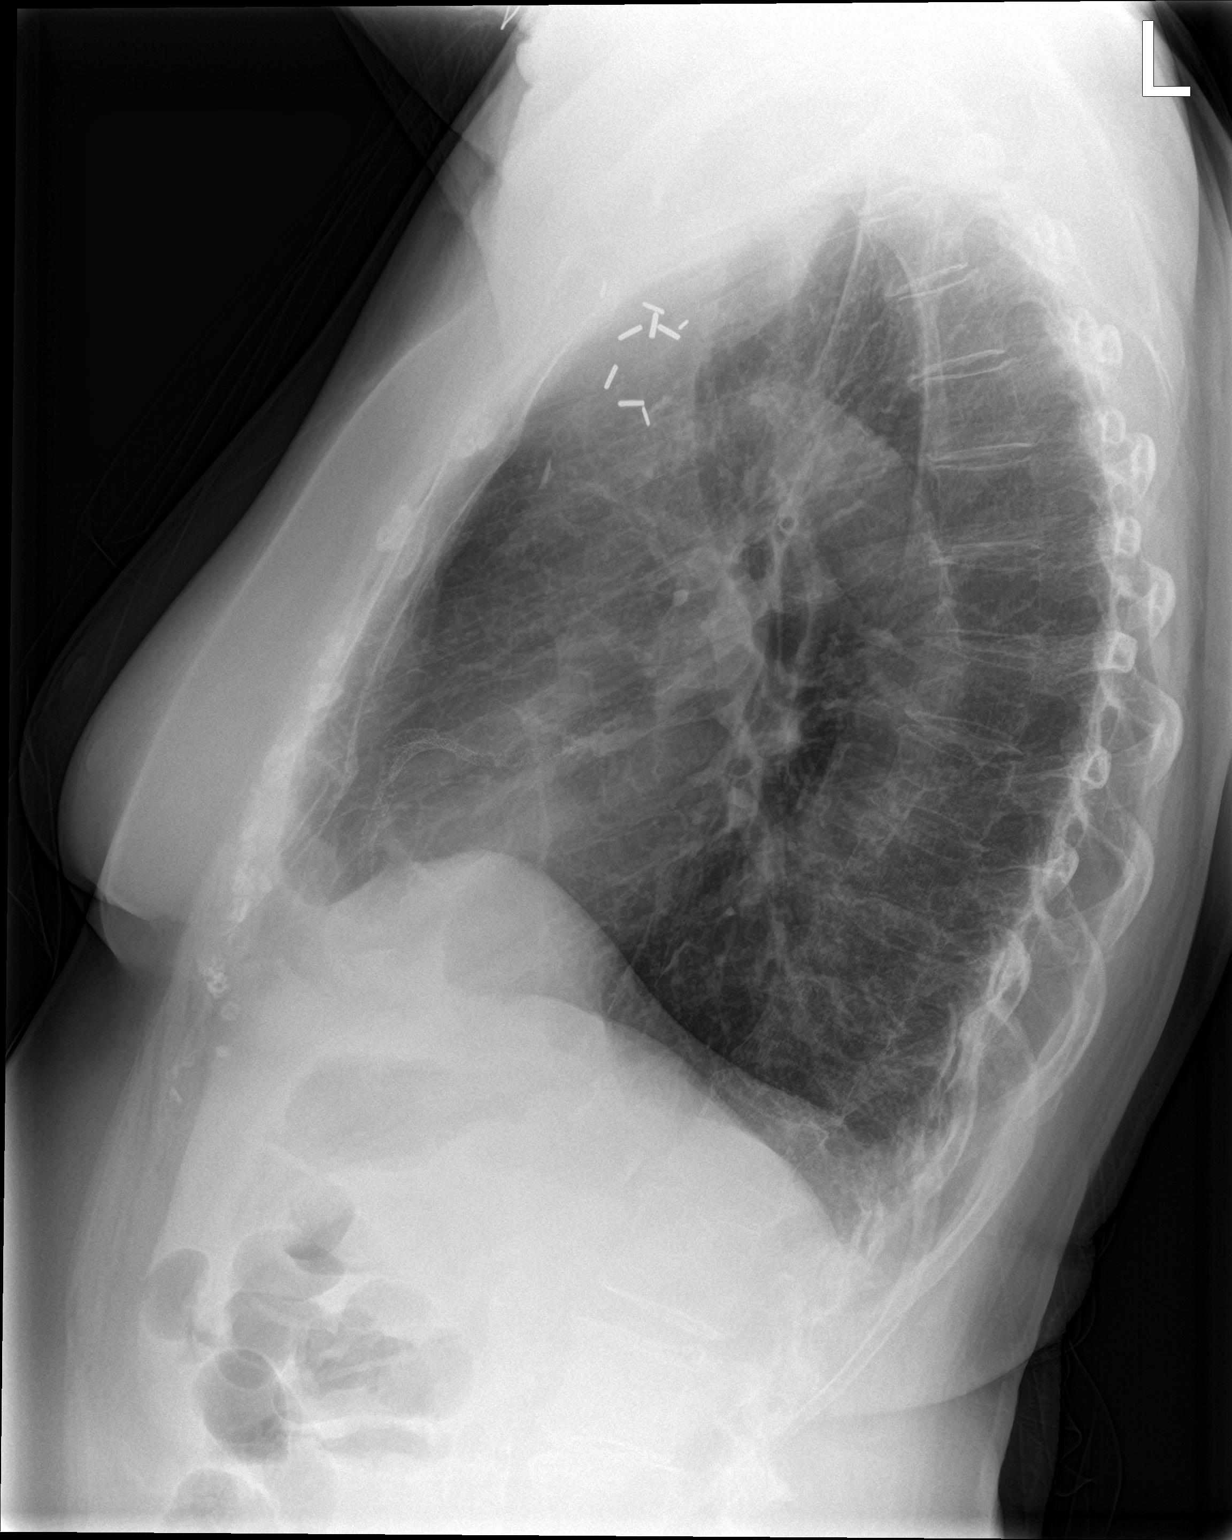

[2 of 2 positions shown; findings below may reference images not displayed]

FINDINGS: The heart size and mediastinal contours are within normal limits.
Multiple coronary stents again noted. Normal pulmonary vascularity.
Slightly more prominent interstitial markings when compared to prior
study. No focal consolidation, pleural effusion, or pneumothorax.
Unchanged eventration of the right hemidiaphragm. No acute osseous
abnormality.
IMPRESSION: New bronchitic changes.  No consolidation

## 2018-12-11 ENCOUNTER — Other Ambulatory Visit: Payer: Self-pay | Admitting: Cardiology

## 2018-12-17 ENCOUNTER — Other Ambulatory Visit: Payer: Self-pay

## 2018-12-17 ENCOUNTER — Encounter: Payer: Self-pay | Admitting: Internal Medicine

## 2018-12-17 ENCOUNTER — Ambulatory Visit (INDEPENDENT_AMBULATORY_CARE_PROVIDER_SITE_OTHER): Payer: Medicare Other | Admitting: Internal Medicine

## 2018-12-17 ENCOUNTER — Ambulatory Visit
Admission: RE | Admit: 2018-12-17 | Discharge: 2018-12-17 | Disposition: A | Discharge status: Home / Self Care | Payer: Medicare Other | Source: Ambulatory Visit | Attending: Internal Medicine | Admitting: Internal Medicine

## 2018-12-17 VITALS — BP 124/70 | HR 75 | Ht 62.5 in | Wt 143.4 lb

## 2018-12-17 DIAGNOSIS — R0789 Other chest pain: Secondary | ICD-10-CM

## 2018-12-17 DIAGNOSIS — I25119 Atherosclerotic heart disease of native coronary artery with unspecified angina pectoris: Secondary | ICD-10-CM | POA: Diagnosis not present

## 2018-12-17 NOTE — Assessment & Plan Note (Addendum)
Onset around 11/2018 s pleurtic or ex cp and T6-8 on L   When informed her symptoms were not likely "side pleurisy" she made the statement that "well that's all I came here to find out " and then gave an extremely vague hx from that point onward about any of her symptoms which she claimed "were not Chest pain at all" and I discovered a bit too late that she prefers to label her cp's by what she's been told or read or assumed  rather than answer specific questions that would help sort out a specific etiology for any of them and became increasing aggravated when I used more generic terms to understand the characteristics of the different cps she's experienced    I have her instructions for IBS and gerd diet and asked her to be on alert for fluctuations in L "side chest pain"  = the cp that actually started in her back and radiated to her L side in c shape c/w T spine compression around T6-8 level and rec a cxr but she left s instructions or cxr or informing us that she was leaving.   > 50% of this acute 40 min acute office visit addressing new pattern of cp was spent in counseling.

## 2018-12-17 NOTE — Patient Instructions (Addendum)
Pt left before being given written instructions which will be mailed to her   GERD (REFLUX)  is an extremely common cause of respiratory symptoms just like yours , many times with no obvious heartburn at all.    It can be treated with medication, but also with lifestyle changes including elevation of the head of your bed (ideally with 6 -8inch blocks under the headboard of your bed),  Smoking cessation, avoidance of late meals, excessive alcohol, and avoid fatty foods, chocolate, peppermint, colas, red wine, and acidic juices such as orange juice.  NO MINT OR MENTHOL PRODUCTS SO NO COUGH DROPS  USE SUGARLESS CANDY INSTEAD (Jolley ranchers or Stover's or Life Savers) or even ice chips will also do - the key is to swallow to prevent all throat clearing. NO OIL BASED VITAMINS - use powdered substitutes.  Avoid fish oil when coughing.    Classic subdiaphragmatic pain pattern suggests ibs:  Stereotypical, migratory with a very limited distribution of pain locations, daytime, not usually exacerbated by exercise  or coughing, worse in sitting position, frequently associated with generalized abd bloating, not as likely to be present supine due to the dome effect of the diaphragm which  is  canceled in that position. Frequently these patients have had multiple negative GI workups and CT scans.  Treatment consists of avoiding foods that cause gas (especially boiled eggs, mexcican food but especially  beans and undercooked vegetables like  spinach and some salads)  and citrucel 1 heaping tsp twice daily with a large glass of water.  Pain should improve w/in 2 weeks and if not then consider further GI work up.      Pay attention to the pattern of the pain - what makes it better /worse how long does it last when it's at its worst   Please remember to go to the  x-ray department  for your tests - we will call you with the results when they are available

## 2018-12-17 NOTE — Progress Notes (Signed)
Subjective:   Patient ID: Dorothy Lee, female    DOB: 1941-06-16    MRN: [id number]   Brief patient profile:  10 yowf never smoker with recurrent cough x 2011 responsive to prednisone for weeks at a time off steroids s the need for cough meds (during the day, still tessalon at hs)  referred for consultation by Dr Dorothy Lee having seen Dr Dorothy Lee previously with dx of pnds/cyclical coughing but requesting a second opinion as cough never resolved until started symbicort 80 in 11/2013 c/w cough variant asthma   History of Present Illness  08/24/2013 1st [facility] office visit/ [name]  Chief Complaint  Patient presents with  . Acute Visit    Pt states cough no better since last ov here in Sept. Cough is occ prod with foamy, white sputum.   no better with abx/ ent intervention/ always better with prednisone.  Presently finishing up round of abx/prednisone and "it's going away like it always does on prednisone" Tends to be worse when lies down with persistent sense of pnds which can disturb sleep as well but amount of mucus does not peak in ams and never purulent. No better/ worse on inhalers Assoc with urinary incont when severe rec Dexilant 60  Mg Take 30-60 min before first meal of the day  And at bedtime Pepcid 20 mg and chlortrimeton 4 mg until return Stop zyrtec  GERD (REFLUX)   For cough you can take the cough syrup or tramadol as needed  For flare return here with all medications in hand including all over the counters all inhalers    09/01/2013 Chief Complaint  Patient presents with  . Acute Visit    Cough with white and yellow mucus  Pt saw MW 11/17 and rec Arlyce Harman and cxr 11/2010 normal  Ct sinuses 05/2011: Acute and chronic sinusitis.  Full PFT's 11/2011: Normal  Dexilant 60 Mg Take 30-60 min before first meal of the day And at bedtime Pepcid 20 mg and chlortrimeton 4 mg until return  Stop zyrtec  Cough worse, now thick yellow. No real heartburn. No dyspnea.  Produces  mucus all the time.  rec Strict reflux diet Stay on Dexilant one daily and pepcid at night Prednisone 10mg  Take 4 for four days 3 for four days 2 for four days 1 for four days Augmentin twice daily for 10days Flonase two puff ea nostril Twice daily Stay on chlorpheniramine Follow cough protocol Stay off work ONE week, voice rest CT Sinus > declined       03/03/2014 f/u ov/Dorothy Lee re: best in years, attributes to starting symbicort 80 2bid/ not using med calendar, easily confused with details of care.   Chief Complaint  Patient presents with  . Follow-up    Pt states that her cough has resolved. No new co's today.    Only symptom is constant runny nose, watery, not waking at night at all - not needing any cough suppression at all  rec Continue symbicort 80 Take 2 puffs first thing in am and then another 2 puffs about 12 hours later.  Work on inhaler technique:    Stop all acid suppression and chlortimeton at bedtime to see what difference this makes     11/08/2014 f/u ov/Dorothy Lee re: chronic cough/ completely off symbiocrt x one month was on for cough variant asthma Chief Complaint  Patient presents with  . Acute Visit    pt c/o prod cough with yellow mucus, sinus congestion, sob when laying down X3 weeks.  symptom onset was gradual and w/in a week of stopping symbicort despite restarting gerd rx  rec Try dulera 100 Take 2 puffs first thing in am and then another 2 puffs about 12 hours later with all you medications and your drug formulary with your insurance company For cough use hydodan 1 tsp every 6 hours if needed       12/11/2017  f/u ov/Dorothy Lee re:  SPN /UACS with cough since 2011  Chief Complaint  Patient presents with  . Follow-up    Nodule   Dyspnea:  Not limited by breathing from desired activities  / back in gym p admit with flu then diverticulitis surgery  Cough: resolved  Sleep: ok  SABA use:  None needed rec No f/u needed    12/17/2018  f/u ov/Dorothy Lee re: new  Lpost/  lateral cp with sob not reproduced/ exac by ex or coughing.  Chief Complaint  Patient presents with  . Acute Visit    Pt c/o SOB x 1 month. She has also had some left side pain. She notices the SOB when she walks up stairs and when she wakes up in the mornings.   Dyspnea:  Ex tol is good, no problem at gym at all  Cough: remains resolved on maint gerd rx  Sleeping: on side / bed flat breathing never   Bothers her  while sleeping  SABA use: none 02: none Takes ppi daily before bfast   New L sided chest discomfort /  Rad around to underneath L breast in a C pattern  not worse with activity/ not worse with deep breathing or coughing or assoc n or v or Diaphoresis.  Onset was ? Abrupt a month prior to OV  And persistent since  with no obvious patterns in  day to day or daytime variability or assoc excess/ purulent sputum or mucus plugs or hemoptysis or chest tightness, subjective wheeze or overt sinus symptoms.   Cards note reviewed and did not mention any cp at that time though says has separate midline pain for years lasting up to 10 min "it's my heartburn so I didn't mention it to Dr Domenic Polite." during his w/u for unexplained syncope at last ov 10/24/18 .   Sleeping one pillow/ flat  without nocturnal  or early am exacerbation  of respiratory  c/o's or need for noct saba. Also denies any obvious fluctuation of symptoms with weather or environmental changes or other aggravating or alleviating factors except as outlined above   No unusual exposure hx or h/o childhood pna/ asthma or knowledge of premature birth.  Current Allergies, Complete Past Medical History, Past Surgical History, Family History, and Social History were reviewed in [employer] record.  ROS  The following are not active complaints unless bolded Hoarseness, sore throat, dysphagia, dental problems, itching, sneezing,  nasal congestion or discharge of excess mucus or purulent secretions, ear ache,   fever,  chills, sweats, unintended wt loss or wt gain, classically pleuritic or exertional cp,  orthopnea pnd or arm/hand swelling  or leg swelling, presyncope, palpitations, abdominal pain, anorexia, nausea, vomiting, diarrhea  or change in bowel habits or change in bladder habits, change in stools or change in urine, dysuria, hematuria,  rash, arthralgias/low back pain, visual complaints, headache, numbness, weakness or ataxia or problems with walking or coordination,  change in mood or  memory.        Current Meds - - NOTE:   Unable to verify as accurately reflecting what pt takes  Medication Sig  . aspirin EC 81 MG tablet Take 81 mg by mouth daily.   Marland Kitchen HYDROcodone-acetaminophen (NORCO) 5-325 MG tablet Denies taking   . isosorbide mononitrate (IMDUR) 30 MG 24 hr tablet TAKE 1 TABLET BY MOUTH EVERY DAY  . latanoprost (XALATAN) 0.005 % ophthalmic solution Place 1 drop into both eyes at bedtime.   . metoprolol succinate (TOPROL-XL) 25 MG 24 hr tablet Take 1 tablet (25 mg total) by mouth daily.  . nitroGLYCERIN (NITROSTAT) 0.4 MG SL tablet Place 0.4 mg under the tongue every 5 (five) minutes x 3 doses as needed for chest pain. May repeat x3  . pantoprazole (PROTONIX) 40 MG tablet TAKE ONE (1) TABLET (40 MG TOTAL) BY MOUTH DAILY.  . rosuvastatin (CRESTOR) 10 MG tablet Take 1 tablet (10 mg total) by mouth daily.  . Wheat Dextrin (BENEFIBER PO) Take 1 Dose by mouth daily.            Objective:   Physical Exam   03/03/2014 137 >  06/08/2014  137>  11/08/2014 144 > 10/04/2016   144 >  12/11/2017  138 > 12/17/2018  143   amb wf nad with somewhat of a belle affect who gets extremely confused when asked details of symptoms as has already labeled herself as having Pleurisy pain and heartburn pain and "NOT CP"  Vital signs reviewed - Note on arrival 02 sats  98% on RA    HEENT: nl dentition, turbinates bilaterally, and oropharynx. Nl external ear canals without cough reflex   NECK :  without JVD/Nodes/TM/ nl  carotid upstrokes bilaterally   LUNGS: no acc muscle use,  Nl contour chest wall s rash/tenderness or hyperesthesia, which is clear to A and P bilaterally without cough on insp or exp maneuvers   CV:  RRR  no s3 or murmur or increase in P2, and no edema   ABD:  soft and nontender with nl inspiratory excursion in the supine position. No bruits or organomegaly appreciated, bowel sounds nl  MS:  Nl gait/ ext warm without deformities, calf tenderness, cyanosis or clubbing No obvious joint restrictions   SKIN: warm and dry without lesions    NEURO:  alert, approp, nl sensorium with  no motor or cerebellar deficits apparent.         Studies ordered 12/17/2018   cxr req but not done (pt left before completing her eval        Assessment & Plan:

## 2018-12-31 ENCOUNTER — Ambulatory Visit (HOSPITAL_COMMUNITY): Payer: Medicare Other

## 2019-01-13 ENCOUNTER — Ambulatory Visit: Payer: Medicare Other | Admitting: Gastroenterology

## 2019-01-28 ENCOUNTER — Ambulatory Visit (HOSPITAL_COMMUNITY): Payer: Medicare Other

## 2019-02-19 ENCOUNTER — Ambulatory Visit (HOSPITAL_COMMUNITY)
Admission: RE | Admit: 2019-02-19 | Discharge: 2019-02-19 | Disposition: A | Discharge status: Home / Self Care | Payer: Medicare Other | Source: Ambulatory Visit | Attending: Family Medicine | Admitting: Family Medicine

## 2019-02-19 ENCOUNTER — Other Ambulatory Visit: Payer: Self-pay

## 2019-02-19 DIAGNOSIS — Z1231 Encounter for screening mammogram for malignant neoplasm of breast: Secondary | ICD-10-CM | POA: Insufficient documentation

## 2019-03-03 DIAGNOSIS — R911 Solitary pulmonary nodule: Secondary | ICD-10-CM | POA: Diagnosis not present

## 2019-03-03 DIAGNOSIS — N3001 Acute cystitis with hematuria: Secondary | ICD-10-CM | POA: Diagnosis not present

## 2019-03-03 DIAGNOSIS — R300 Dysuria: Secondary | ICD-10-CM | POA: Diagnosis not present

## 2019-03-04 ENCOUNTER — Other Ambulatory Visit: Payer: Self-pay | Admitting: Cardiology

## 2019-03-04 DIAGNOSIS — I10 Essential (primary) hypertension: Secondary | ICD-10-CM

## 2019-04-17 ENCOUNTER — Telehealth: Payer: Self-pay | Admitting: Cardiology

## 2019-04-17 NOTE — Telephone Encounter (Signed)
Called pt., no answer. Left message for pt to return call.  

## 2019-04-17 NOTE — Telephone Encounter (Signed)
Patient left VM asking for nurse to call. Did not give reason. / tg

## 2019-04-22 NOTE — Telephone Encounter (Signed)
Returned call, she thinks she may have called wrong number and left voicemail.

## 2019-04-29 DIAGNOSIS — I2510 Atherosclerotic heart disease of native coronary artery without angina pectoris: Secondary | ICD-10-CM | POA: Diagnosis not present

## 2019-04-29 DIAGNOSIS — R911 Solitary pulmonary nodule: Secondary | ICD-10-CM | POA: Diagnosis not present

## 2019-04-29 DIAGNOSIS — Z853 Personal history of malignant neoplasm of breast: Secondary | ICD-10-CM | POA: Diagnosis not present

## 2019-05-01 DIAGNOSIS — Z961 Presence of intraocular lens: Secondary | ICD-10-CM | POA: Diagnosis not present

## 2019-05-01 DIAGNOSIS — H401132 Primary open-angle glaucoma, bilateral, moderate stage: Secondary | ICD-10-CM | POA: Diagnosis not present

## 2019-05-04 NOTE — Progress Notes (Signed)
Cardiology Office Note  Date: 05/05/2019   ID: Dorothy Lee, DOB 09-29-1941, MRN [id number]  PCP:  [name], MD  Cardiologist:  [name], MD Electrophysiologist:  None   Chief Complaint  Patient presents with  . Cardiac follow-up    History of Present Illness: Dorothy Lee is a 78 y.o. female last seen in January.  She presents for a follow-up visit.  She describes 2 episodes of angina in the last few months, resolved with 1-2 nitroglycerin.  She has otherwise been stable with her cardiac medications reporting compliance.  She has not had any palpitations or syncope.  Cardiac monitor and carotid Dopplers from January are outlined below.  Over her medications and we also discussed her last stress test from January 2019 which was low risk.  She has not had a follow-up lipid panel.  Past Medical History:  Diagnosis Date  . Allergic rhinitis   . CAD (coronary artery disease)    DES RCA and LAD 02/2008, DES mid LAD 11/2012  . Diverticulitis   . Diverticulosis   . Essential hypertension   . GERD (gastroesophageal reflux disease)   . Glaucoma   . History of breast cancer    Right - s/p XRT 1984, surgery, no chemo  . Hyperlipidemia   . Personal history of radiation therapy   . Rotator cuff syndrome, left   . Scoliosis   . Urinary incontinence     Past Surgical History:  Procedure Laterality Date  . APPENDECTOMY  1981  . BREAST BIOPSY  1984  . BREAST LUMPECTOMY  1982   right breast  . COLONOSCOPY  2008   Dr. Oletta Lamas: diverticulosis, hemorrhoids, conscious sedation  . COLONOSCOPY N/A 01/30/2016   Dr. Gala Romney: mild divertiuculosis in sigmoid colon. narrowing of the colon in association with the deverticular opening. 6 polyps removed, tubular adenoma. next TCS in 3 years  . CORONARY ANGIOPLASTY WITH STENT PLACEMENT  02/2008  . ESOPHAGOGASTRODUODENOSCOPY  2014   Dr. Fuller Plan: erosive gastritis, no h.pylori  . ESOPHAGOGASTRODUODENOSCOPY N/A 01/30/2016   Dr. Gala Romney:  normal esophagus s/p dilation, small hh.  Marland Kitchen LEFT HEART CATHETERIZATION WITH CORONARY ANGIOGRAM N/A 12/02/2012   Procedure: LEFT HEART CATHETERIZATION WITH CORONARY ANGIOGRAM;  Surgeon: Jettie Booze, MD;  Location: [facility] CATH LAB;  Service: Cardiovascular;  Laterality: N/A;  . Venia Minks DILATION N/A 01/30/2016   Procedure: Venia Minks DILATION;  Surgeon: Daneil Dolin, MD;  Location: [facility] ENDO SU[facility];  Service: Endoscopy;  Laterality: N/A;  . NASAL SINUS SURGERY    . PARTIAL COLECTOMY N/A 05/19/2018   Procedure: PARTIAL COLECTOMY;  Surgeon: Aviva Signs, MD;  Location: [facility] ORS;  Service: General;  Laterality: N/A;  . PERCUTANEOUS CORONARY STENT INTERVENTION (PCI-S)  12/02/2012   Procedure: PERCUTANEOUS CORONARY STENT INTERVENTION (PCI-S);  Surgeon: Jettie Booze, MD;  Location: [facility] CATH LAB;  Service: Cardiovascular;;  DES to the MID LAD    Current Outpatient Medications  Medication Sig Dispense Refill  . aspirin EC 81 MG tablet Take 81 mg by mouth daily.     Marland Kitchen latanoprost (XALATAN) 0.005 % ophthalmic solution Place 1 drop into both eyes at bedtime.     . metoprolol succinate (TOPROL-XL) 25 MG 24 hr tablet TAKE ONE (1) TABLET (25 MG TOTAL) BY MOUTH DAILY.  90 tablet 1  . nitroGLYCERIN (NITROSTAT) 0.4 MG SL tablet Place 0.4 mg under the tongue every 5 (five) minutes x 3 doses as needed for chest pain. May repeat x3    . pantoprazole (  PROTONIX) 40 MG tablet Take 40 mg by mouth daily.    . rosuvastatin (CRESTOR) 10 MG tablet Take 1 tablet (10 mg total) by mouth daily. 90 tablet 3  . Wheat Dextrin (BENEFIBER PO) Take 1 Dose by mouth daily.    . isosorbide mononitrate (IMDUR) 60 MG 24 hr tablet Take 1 tablet (60 mg total) by mouth daily. 90 tablet 3   No current facility-administered medications for this visit.    Allergies:  Crestor [rosuvastatin], Lipitor [atorvastatin], Lisinopril, and Simvastatin   Social History: The patient  reports that she has never smoked. She has never used smokeless  tobacco. She reports previous alcohol use. She reports that she does not use drugs.   ROS:  Please see the history of present illness. Otherwise, complete review of systems is positive for none.  All other systems are reviewed and negative.   Physical Exam: VS:  BP 140/82   Pulse 75   Temp (!) 97.1 F (36.2 C)   Ht 5' 2.5" (1.588 m)   Wt 144 lb (65.3 kg)   BMI 25.92 kg/m , BMI Body mass index is 25.92 kg/m.  Wt Readings from Last 3 Encounters:  05/05/19 144 lb (65.3 kg)  12/17/18 143 lb 6.4 oz (65 kg)  10/24/18 142 lb (64.4 kg)    General: Patient appears comfortable at rest. HEENT: Conjunctiva and lids normal, oropharynx clear with moist mucosa. Neck: Supple, no elevated JVP or carotid bruits, no thyromegaly. Lungs: Clear to auscultation, nonlabored breathing at rest. Cardiac: Regular rate and rhythm, no S3 or significant systolic murmur, no pericardial rub. Abdomen: Soft, nontender, no hepatomegaly, bowel sounds present, no guarding or rebound. Extremities: No pitting edema, distal pulses 2+. Skin: Warm and dry. Musculoskeletal: No kyphosis. Neuropsychiatric: Alert and oriented x3, affect grossly appropriate.  ECG:  An ECG dated 10/11/2018 was personally reviewed today and demonstrated:  Sinus rhythm with possible left atrial enlargement and borderline low voltage in the precordial leads, nonspecific T wave changes.  Recent Labwork: 05/22/2018: Magnesium 1.9 10/11/2018: ALT 17; AST 23; BUN 13; Creatinine, Ser 0.79; Hemoglobin 13.3; Platelets 348; Potassium 3.7; Sodium 142     Component Value Date/Time   CHOL 173 12/09/2017 1212   TRIG 204 (H) 12/09/2017 1212   TRIG 119 05/16/2010   HDL 43 (L) 12/09/2017 1212   CHOLHDL 4.0 12/09/2017 1212   VLDL 16.4 08/19/2014 1453   LDLCALC 98 12/09/2017 1212   LDLDIRECT 144.4 11/09/2013 0934    Other Studies Reviewed Today:  Echocardiogram 09/02/2018: Study Conclusions  - Left ventricle: The cavity size was normal. Systolic  function was normal. The estimated ejection fraction was in the range of 60% to 65%. Wall motion was normal; there were no regional wall motion abnormalities. Left ventricular diastolic function parameters were normal. - Aortic valve: Sclerosis without stenosis. - Atrial septum: No defect or patent foramen ovale was identified.  Lexiscan Myoview 11/05/2017:  No diagnostic ST segment changes to indicate ischemia.  Small, mild intensity, mid to apical septal defect that is fixed, more prominent at rest and stress. Suggestive of soft tissue attenuation rather than scar in light of normal wall motion.  This is a low risk study.  Nuclear stress EF: 72%.  Carotid Dopplers 11/05/2018: IMPRESSION: Less than 50% stenosis in the right and left internal carotid arteries.  Event monitor 11/03/2018: 7-day event recorder reviewed.  Sinus rhythm was present throughout.  Heart rate ranged from 56 bpm up to 115 bpm with average heart rate 72 bpm.  There were no significant arrhythmias or pauses.  Assessment and Plan:  1.  CAD status post DES to the RCA and LAD in 2009 with subsequent DES to the LAD in 2014.  She has had intermittent angina symptoms and will plan to increase Imdur to 60 mg daily.  Refill provided for fresh bottle of nitroglycerin.  No change in medical regimen, will arrange follow-up for review of symptoms.  Last Myoview was in January 2019 and low risk.  2.  Mixed hyperlipidemia on Crestor.  She reports no intolerances.  Follow-up FLP and LFTs.  3.  Asymptomatic mild ICA stenoses by carotid Dopplers in January.  4.  History of neurocardiogenic syncope, no recurrences.  Medication Adjustments/Labs and Tests Ordered: Current medicines are reviewed at length with the patient today.  Concerns regarding medicines are outlined above.   Tests Ordered: Orders Placed This Encounter  Procedures  . Lipid Profile  . Hepatic function panel    Medication Changes: Meds ordered  this encounter  Medications  . isosorbide mononitrate (IMDUR) 60 MG 24 hr tablet    Sig: Take 1 tablet (60 mg total) by mouth daily.    Dispense:  90 tablet    Refill:  3    Disposition:  Follow up 3 months in the [place] office.  Signed, Satira Sark, MD, [facility] 05/05/2019 11:05 AM    [facility]. [address] [place], [place] [address] Phone: [phone]; Fax: [phone]

## 2019-05-05 ENCOUNTER — Ambulatory Visit (INDEPENDENT_AMBULATORY_CARE_PROVIDER_SITE_OTHER): Payer: Medicare Other | Admitting: Cardiology

## 2019-05-05 ENCOUNTER — Other Ambulatory Visit: Payer: Self-pay

## 2019-05-05 ENCOUNTER — Encounter: Payer: Self-pay | Admitting: Cardiology

## 2019-05-05 VITALS — BP 140/82 | HR 75 | Temp 97.1°F | Ht 62.5 in | Wt 144.0 lb

## 2019-05-05 DIAGNOSIS — I25119 Atherosclerotic heart disease of native coronary artery with unspecified angina pectoris: Secondary | ICD-10-CM | POA: Diagnosis not present

## 2019-05-05 DIAGNOSIS — I10 Essential (primary) hypertension: Secondary | ICD-10-CM | POA: Diagnosis not present

## 2019-05-05 DIAGNOSIS — R55 Syncope and collapse: Secondary | ICD-10-CM

## 2019-05-05 DIAGNOSIS — E782 Mixed hyperlipidemia: Secondary | ICD-10-CM | POA: Diagnosis not present

## 2019-05-05 MED ORDER — ISOSORBIDE MONONITRATE ER 60 MG PO TB24: 60.0000 mg | ORAL_TABLET | Freq: Every day | ORAL | 3 refills | Status: DC

## 2019-05-05 MED ORDER — NITROGLYCERIN 0.4 MG SL SUBL: 0.4000 mg | SUBLINGUAL_TABLET | SUBLINGUAL | 3 refills | Status: DC | PRN

## 2019-05-05 NOTE — Patient Instructions (Signed)
Medication Instructions: INCREASE Imdur to 60 mg daily   I refilled your nitroglycerin   Labwork: Fasting Lipid and liver function tests  Procedures/Testing: none  Follow-Up: 3 months with Dr.McDowell  Any Additional Special Instructions Will Be Listed Below (If Applicable).     If you need a refill on your cardiac medications before your next appointment, please call your pharmacy.

## 2019-05-06 LAB — LIPID PANEL
Cholesterol: 167 mg/dL (ref <200)
HDL: 45 mg/dL — ABNORMAL LOW (ref >=50)
LDL Cholesterol (Calc): 99 mg/dL (calc)
Non-HDL Cholesterol (Calc): 122 mg/dL (calc) (ref <130)
Total CHOL/HDL Ratio: 3.7 (calc) (ref <5.0)
Triglycerides: 129 mg/dL (ref <150)

## 2019-05-06 LAB — HEPATIC FUNCTION PANEL
AG Ratio: 1.6 (calc) (ref 1.0–2.5)
ALT: 14 U/L (ref 6–29)
AST: 22 U/L (ref 10–35)
Albumin: 4.1 g/dL (ref 3.6–5.1)
Alkaline phosphatase (APISO): 69 U/L (ref 37–153)
Bilirubin, Direct: 0.2 mg/dL (ref 0.0–0.2)
Globulin: 2.6 g/dL (calc) (ref 1.9–3.7)
Indirect Bilirubin: 0.5 mg/dL (calc) (ref 0.2–1.2)
Total Bilirubin: 0.7 mg/dL (ref 0.2–1.2)
Total Protein: 6.7 g/dL (ref 6.1–8.1)

## 2019-05-12 DIAGNOSIS — R002 Palpitations: Secondary | ICD-10-CM | POA: Diagnosis not present

## 2019-05-20 DIAGNOSIS — M47816 Spondylosis without myelopathy or radiculopathy, lumbar region: Secondary | ICD-10-CM | POA: Diagnosis not present

## 2019-05-20 DIAGNOSIS — M5136 Other intervertebral disc degeneration, lumbar region: Secondary | ICD-10-CM | POA: Diagnosis not present

## 2019-05-20 DIAGNOSIS — M4150 Other secondary scoliosis, site unspecified: Secondary | ICD-10-CM | POA: Diagnosis not present

## 2019-05-25 DIAGNOSIS — J320 Chronic maxillary sinusitis: Secondary | ICD-10-CM | POA: Diagnosis not present

## 2019-06-03 ENCOUNTER — Other Ambulatory Visit: Payer: Self-pay | Admitting: Cardiology

## 2019-06-06 ENCOUNTER — Other Ambulatory Visit: Payer: Self-pay | Admitting: Cardiology

## 2019-07-14 DIAGNOSIS — Z23 Encounter for immunization: Secondary | ICD-10-CM | POA: Diagnosis not present

## 2019-07-17 DIAGNOSIS — J342 Deviated nasal septum: Secondary | ICD-10-CM | POA: Diagnosis not present

## 2019-07-17 DIAGNOSIS — H8111 Benign paroxysmal vertigo, right ear: Secondary | ICD-10-CM | POA: Diagnosis not present

## 2019-07-29 ENCOUNTER — Telehealth: Payer: Self-pay | Admitting: Cardiology

## 2019-07-29 NOTE — Telephone Encounter (Signed)

## 2019-08-04 DIAGNOSIS — H401132 Primary open-angle glaucoma, bilateral, moderate stage: Secondary | ICD-10-CM | POA: Diagnosis not present

## 2019-08-07 ENCOUNTER — Telehealth: Payer: Medicare Other | Admitting: Cardiology

## 2019-08-17 DIAGNOSIS — H9201 Otalgia, right ear: Secondary | ICD-10-CM | POA: Diagnosis not present

## 2019-08-21 ENCOUNTER — Other Ambulatory Visit: Payer: Self-pay

## 2019-08-21 ENCOUNTER — Encounter: Payer: Self-pay | Admitting: Cardiology

## 2019-08-21 ENCOUNTER — Ambulatory Visit (INDEPENDENT_AMBULATORY_CARE_PROVIDER_SITE_OTHER): Payer: Medicare Other | Admitting: Cardiology

## 2019-08-21 ENCOUNTER — Telehealth: Payer: Self-pay

## 2019-08-21 VITALS — BP 161/84 | HR 63 | Temp 97.8°F | Ht 62.0 in | Wt 147.0 lb

## 2019-08-21 DIAGNOSIS — I10 Essential (primary) hypertension: Secondary | ICD-10-CM

## 2019-08-21 DIAGNOSIS — E782 Mixed hyperlipidemia: Secondary | ICD-10-CM

## 2019-08-21 DIAGNOSIS — R55 Syncope and collapse: Secondary | ICD-10-CM | POA: Diagnosis not present

## 2019-08-21 DIAGNOSIS — I25119 Atherosclerotic heart disease of native coronary artery with unspecified angina pectoris: Secondary | ICD-10-CM

## 2019-08-21 MED ORDER — EZETIMIBE 10 MG PO TABS: 10.0000 mg | ORAL_TABLET | Freq: Every day | ORAL | 3 refills | Status: DC

## 2019-08-21 NOTE — Telephone Encounter (Signed)
New message      Please call is there another medication she can be on this medication is too expensive for her to get , it is $50 for 30 pills , she is not going to take this prescription.   ezetimibe (ZETIA) 10 MG tablet Take 1 tablet (10 mg total) by mouth daily.

## 2019-08-21 NOTE — Progress Notes (Signed)
Cardiology Office Note  Date: 08/21/2019   ID: Dorothy Lee, DOB May 02, 1941, MRN [id number]  PCP:  [name], MD  Cardiologist:  [name], MD Electrophysiologist:  None   Chief Complaint  Patient presents with  . Cardiac follow-up    History of Present Illness: Dorothy Lee is a 78 y.o. female last seen in July.  She presents for a routine visit.  She tells me that she has been doing relatively well.  She did have a prolonged episode of atypical chest pain that lasted for about 3 days without precipitant.  She is not sure whether it was related to inflammation or reflux.  She remains functional with ADLs.  Not going to the gym during the pandemic but does walk her dog.  Follow-up lab work from July is outlined below.  LDL came down but still not at goal.  She has been on Crestor at 10 mg daily.  She did not want and decrease the dose further due to prior intolerances but agreed to starting Zetia.  Her blood pressure is elevated today.  We discussed this, talked about salt restrictions and increasing activity.  She is hesitant to add any medications at this time.  I reviewed her ECG from January as outlined below.  Past Medical History:  Diagnosis Date  . Allergic rhinitis   . CAD (coronary artery disease)    DES RCA and LAD 02/2008, DES mid LAD 11/2012  . Diverticulitis   . Diverticulosis   . Essential hypertension   . GERD (gastroesophageal reflux disease)   . Glaucoma   . History of breast cancer    Right - s/p XRT 1984, surgery, no chemo  . Hyperlipidemia   . Personal history of radiation therapy   . Rotator cuff syndrome, left   . Scoliosis   . Urinary incontinence     Past Surgical History:  Procedure Laterality Date  . APPENDECTOMY  1981  . BREAST BIOPSY  1984  . BREAST LUMPECTOMY  1982   right breast  . COLONOSCOPY  2008   Dr. Oletta Lamas: diverticulosis, hemorrhoids, conscious sedation  . COLONOSCOPY N/A 01/30/2016   Dr. Gala Romney: mild  divertiuculosis in sigmoid colon. narrowing of the colon in association with the deverticular opening. 6 polyps removed, tubular adenoma. next TCS in 3 years  . CORONARY ANGIOPLASTY WITH STENT PLACEMENT  02/2008  . ESOPHAGOGASTRODUODENOSCOPY  2014   Dr. Fuller Plan: erosive gastritis, no h.pylori  . ESOPHAGOGASTRODUODENOSCOPY N/A 01/30/2016   Dr. Gala Romney: normal esophagus s/p dilation, small hh.  Marland Kitchen LEFT HEART CATHETERIZATION WITH CORONARY ANGIOGRAM N/A 12/02/2012   Procedure: LEFT HEART CATHETERIZATION WITH CORONARY ANGIOGRAM;  Surgeon: Jettie Booze, MD;  [facility] LAB;  Service: Cardiovascular;  Laterality: N/A;  . Venia Minks DILATION N/A 01/30/2016   Procedure: Venia Minks DILATION;  Surgeon: Daneil Dolin, MD;  Location: [facility];  Service: Endoscopy;  Laterality: N/A;  . NASAL SINUS SURGERY    . PARTIAL COLECTOMY N/A 05/19/2018   Procedure: PARTIAL COLECTOMY;  Surgeon: Aviva Signs, MD;  Location: [facility] ORS;  Service: General;  Laterality: N/A;  . PERCUTANEOUS CORONARY STENT INTERVENTION (PCI-S)  12/02/2012   Procedure: PERCUTANEOUS CORONARY STENT INTERVENTION (PCI-S);  Surgeon: Jettie Booze, MD;  Location: [facility] CATH LAB;  Service: Cardiovascular;;  DES to the MID LAD    Current Outpatient Medications  Medication Sig Dispense Refill  . aspirin EC 81 MG tablet Take 81 mg by mouth daily.     . isosorbide mononitrate (  IMDUR) 60 MG 24 hr tablet Take 1 tablet (60 mg total) by mouth daily. 90 tablet 3  . latanoprost (XALATAN) 0.005 % ophthalmic solution Place 1 drop into both eyes at bedtime.     . metoprolol succinate (TOPROL-XL) 25 MG 24 hr tablet TAKE ONE (1) TABLET (25 MG TOTAL) BY MOUTH DAILY.  90 tablet 1  . nitroGLYCERIN (NITROSTAT) 0.4 MG SL tablet Place 1 tablet (0.4 mg total) under the tongue every 5 (five) minutes x 3 doses as needed for chest pain. May repeat x3 25 tablet 3  . pantoprazole (PROTONIX) 40 MG tablet TAKE ONE (1) TABLET (40 MG TOTAL) BY MOUTH DAILY.  90 tablet 1  .  rosuvastatin (CRESTOR) 10 MG tablet Take 1 tablet (10 mg total) by mouth daily. 90 tablet 3  . Wheat Dextrin (BENEFIBER PO) Take 1 Dose by mouth daily.    Marland Kitchen ezetimibe (ZETIA) 10 MG tablet Take 1 tablet (10 mg total) by mouth daily. 90 tablet 3   No current facility-administered medications for this visit.    Allergies:  Crestor [rosuvastatin], Lipitor [atorvastatin], Lisinopril, and Simvastatin   Social History: The patient  reports that she has never smoked. She has never used smokeless tobacco. She reports previous alcohol use. She reports that she does not use drugs.   ROS:  Please see the history of present illness. Otherwise, complete review of systems is positive for none.  All other systems are reviewed and negative.   Physical Exam: VS:  BP (!) 161/84   Pulse 63   Temp 97.8 F (36.6 C)   Ht 5\' 2"  (1.575 m)   Wt 147 lb (66.7 kg)   SpO2 92%   BMI 26.89 kg/m , BMI Body mass index is 26.89 kg/m.  Wt Readings from Last 3 Encounters:  08/21/19 147 lb (66.7 kg)  05/05/19 144 lb (65.3 kg)  12/17/18 143 lb 6.4 oz (65 kg)    General: Elderly woman, appears comfortable at rest. HEENT: Conjunctiva and lids normal, wearing a mask. Neck: Supple, no elevated JVP or carotid bruits, no thyromegaly. Lungs: Clear to auscultation, nonlabored breathing at rest. Cardiac: Regular rate and rhythm, no S3 or significant systolic murmur. Abdomen: Soft, nontender, bowel sounds present. Extremities: No pitting edema, distal pulses 2+. Skin: Warm and dry. Musculoskeletal: No kyphosis. Neuropsychiatric: Alert and oriented x3, affect grossly appropriate.  ECG:  An ECG dated 10/11/2018 was personally reviewed today and demonstrated:  Sinus rhythm with possible left atrial enlargement and borderline low voltage in the precordial leads, nonspecific T wave changes.  Recent Labwork: 10/11/2018: BUN 13; Creatinine, Ser 0.79; Hemoglobin 13.3; Platelets 348; Potassium 3.7; Sodium 142 05/05/2019: ALT 14; AST  22     Component Value Date/Time   CHOL 167 05/05/2019 1124   TRIG 129 05/05/2019 1124   TRIG 119 05/16/2010   HDL 45 (L) 05/05/2019 1124   CHOLHDL 3.7 05/05/2019 1124   VLDL 16.4 08/19/2014 1453   LDLCALC 99 05/05/2019 1124   LDLDIRECT 144.4 11/09/2013 0934    Other Studies Reviewed Today:  Echocardiogram 09/02/2018: Study Conclusions  - Left ventricle: The cavity size was normal. Systolic function was normal. The estimated ejection fraction was in the range of 60% to 65%. Wall motion was normal; there were no regional wall motion abnormalities. Left ventricular diastolic function parameters were normal. - Aortic valve: Sclerosis without stenosis. - Atrial septum: No defect or patent foramen ovale was identified.  Lexiscan Myoview 11/05/2017:  No diagnostic ST segment changes to indicate ischemia.  Small, mild intensity, mid to apical septal defect that is fixed, more prominent at rest and stress. Suggestive of soft tissue attenuation rather than scar in light of normal wall motion.  This is a low risk study.  Nuclear stress EF: 72%.  Carotid Dopplers 11/05/2018: IMPRESSION: Less than 50% stenosis in the right and left internal carotid arteries.  Event monitor 11/03/2018: 7-day event recorder reviewed. Sinus rhythm was present throughout. Heart rate ranged from 56 bpm up to 115 bpm with average heart rate 72 bpm. There were no significant arrhythmias or pauses.  Assessment and Plan:  1.  CAD status post DES to the RCA and LAD in 2009 with DES to the LAD in 2014.  We will continue medical therapy and observation.  I reviewed her ECG from earlier in the year.  She had a follow-up Myoview last year that was overall low risk.  Continue aspirin, beta-blocker, and statin.  2.  Mixed hyperlipidemia, LDL down to 99 on Crestor 10 mg daily.  She could not tolerate higher doses previously.  Adding Zetia 10 mg daily for now.  Check FLP for next visit.  3.   Asymptomatic mild carotid artery disease.  Continue aspirin and statin.  4.  Elevated blood pressure, we discussed salt restriction and increasing her walking plan.  She does check measurements at home.  She was hesitant to add any medications at this time.  5.  History of vasovagal syncope without recurrence.  Medication Adjustments/Labs and Tests Ordered: Current medicines are reviewed at length with the patient today.  Concerns regarding medicines are outlined above.   Tests Ordered: Orders Placed This Encounter  Procedures  . Lipid Profile    Medication Changes: Meds ordered this encounter  Medications  . ezetimibe (ZETIA) 10 MG tablet    Sig: Take 1 tablet (10 mg total) by mouth daily.    Dispense:  90 tablet    Refill:  3    Disposition:  Follow up 6 months in the [place] office.  Signed, Satira Sark, MD, [facility] 08/21/2019 9:45 AM    [facility] [address], [place], [place] [address] Phone: [phone]; Fax: [phone]

## 2019-08-21 NOTE — Telephone Encounter (Signed)
Pt notified and voiced understanding. GoodRx card mailed

## 2019-08-21 NOTE — Telephone Encounter (Signed)
Will forward to Dr. McDowell 

## 2019-08-21 NOTE — Telephone Encounter (Signed)
This is the only one I was considering adding, there is not necessarily a substitute for it.  Could consider checking with other pharmacies as sometimes the price is lower.  If she does not want to take this, we will hold off.

## 2019-08-21 NOTE — Patient Instructions (Signed)
Medication Instructions:  START Zetia 10 mg daily  *If you need a refill on your cardiac medications before your next appointment, please call your pharmacy*  Lab Work: Lipids in 6 months [place] next visit  If you have labs (blood work) drawn today and your tests are completely normal, you will receive your results only by: Marland Kitchen MyChart Message (if you have MyChart) OR . A paper copy in the mail If you have any lab test that is abnormal or we need to change your treatment, we will call you to review the results.  Testing/Procedures: None today  Follow-Up: At [facility], you and your health needs are our priority.  As part of our continuing mission to provide you with exceptional heart care, we have created designated Provider Care Teams.  These Care Teams include your primary Cardiologist (physician) and Advanced Practice Providers (APPs -  Physician Assistants and Nurse Practitioners) who all work together to provide you with the care you need, when you need it.  Your next appointment:   6 months  The format for your next appointment:   In Person  Provider:   [name], MD  Other Instructions None     Thank you for choosing [facility] !

## 2019-08-24 ENCOUNTER — Other Ambulatory Visit: Payer: Self-pay | Admitting: Cardiology

## 2019-08-24 DIAGNOSIS — I10 Essential (primary) hypertension: Secondary | ICD-10-CM

## 2019-09-11 ENCOUNTER — Telehealth: Payer: Self-pay | Admitting: Cardiology

## 2019-09-11 NOTE — Telephone Encounter (Signed)
Pt reports that she feels her Zetia is causing her to be dizzy and weak. States that she can not take more that 10 mg of any cholesterol med in total. Please advise.

## 2019-09-11 NOTE — Telephone Encounter (Signed)
I am sorry that she has not tolerated this.  There is not a lower dose of Zetia, so I would go ahead and stop it.

## 2019-09-11 NOTE — Telephone Encounter (Signed)
Patient stated she has had many side affects from new medication   ezetimibe (ZETIA) 10 MG tablet

## 2019-09-11 NOTE — Telephone Encounter (Signed)
Returned pt call. No answer. Left msg to call back.  

## 2019-09-11 NOTE — Telephone Encounter (Signed)
Pt.notified

## 2019-10-12 ENCOUNTER — Telehealth: Payer: Self-pay | Admitting: Cardiology

## 2019-10-12 NOTE — Telephone Encounter (Signed)
Called pt. No answer. Left detailed message on pt's private voicemail.

## 2019-10-12 NOTE — Telephone Encounter (Signed)
Would like to know if it's safe for her to get the COVID vaccine

## 2019-10-12 NOTE — Telephone Encounter (Signed)
Yes, she is someone that would be a good candidate for the COVID-19 vaccine.  Please let her know that I already got mine.

## 2019-10-12 NOTE — Telephone Encounter (Signed)
Will forward to Dr. McDowell to advise.  

## 2019-10-29 DIAGNOSIS — J324 Chronic pansinusitis: Secondary | ICD-10-CM | POA: Diagnosis not present

## 2019-11-06 ENCOUNTER — Other Ambulatory Visit: Payer: Self-pay | Admitting: Cardiology

## 2019-11-12 ENCOUNTER — Other Ambulatory Visit: Payer: Self-pay | Admitting: Cardiology

## 2019-11-25 ENCOUNTER — Other Ambulatory Visit: Payer: Self-pay | Admitting: Physician Assistant

## 2019-11-25 DIAGNOSIS — I25118 Atherosclerotic heart disease of native coronary artery with other forms of angina pectoris: Secondary | ICD-10-CM | POA: Diagnosis not present

## 2019-11-25 DIAGNOSIS — N644 Mastodynia: Secondary | ICD-10-CM | POA: Diagnosis not present

## 2019-11-25 DIAGNOSIS — R202 Paresthesia of skin: Secondary | ICD-10-CM | POA: Diagnosis not present

## 2019-11-25 DIAGNOSIS — R350 Frequency of micturition: Secondary | ICD-10-CM | POA: Diagnosis not present

## 2019-12-10 ENCOUNTER — Emergency Department (HOSPITAL_COMMUNITY)
Admission: EM | Admit: 2019-12-10 | Discharge: 2019-12-10 | Disposition: A | Discharge status: Home / Self Care | Payer: Medicare Other | Attending: Emergency Medicine | Admitting: Emergency Medicine

## 2019-12-10 ENCOUNTER — Other Ambulatory Visit: Payer: Self-pay

## 2019-12-10 ENCOUNTER — Emergency Department (HOSPITAL_COMMUNITY): Payer: Medicare Other

## 2019-12-10 DIAGNOSIS — Z79899 Other long term (current) drug therapy: Secondary | ICD-10-CM | POA: Insufficient documentation

## 2019-12-10 DIAGNOSIS — I10 Essential (primary) hypertension: Secondary | ICD-10-CM | POA: Insufficient documentation

## 2019-12-10 DIAGNOSIS — Z7982 Long term (current) use of aspirin: Secondary | ICD-10-CM | POA: Insufficient documentation

## 2019-12-10 DIAGNOSIS — I2510 Atherosclerotic heart disease of native coronary artery without angina pectoris: Secondary | ICD-10-CM | POA: Diagnosis not present

## 2019-12-10 DIAGNOSIS — R519 Headache, unspecified: Secondary | ICD-10-CM | POA: Diagnosis not present

## 2019-12-10 DIAGNOSIS — I159 Secondary hypertension, unspecified: Secondary | ICD-10-CM

## 2019-12-10 DIAGNOSIS — R079 Chest pain, unspecified: Secondary | ICD-10-CM | POA: Diagnosis not present

## 2019-12-10 LAB — CBC
HCT: 41.4 % (ref 36.0–46.0)
Hemoglobin: 13 g/dL (ref 12.0–15.0)
MCH: 29.3 pg (ref 26.0–34.0)
MCHC: 31.4 g/dL (ref 30.0–36.0)
MCV: 93.2 fL (ref 80.0–100.0)
Platelets: 263 10*3/uL (ref 150–400)
RBC: 4.44 MIL/uL (ref 3.87–5.11)
RDW: 12.5 % (ref 11.5–15.5)
WBC: 6.5 10*3/uL (ref 4.0–10.5)
nRBC: 0 % (ref 0.0–0.2)

## 2019-12-10 LAB — BASIC METABOLIC PANEL
Anion gap: 6 (ref 5–15)
BUN: 17 mg/dL (ref 8–23)
CO2: 27 mmol/L (ref 22–32)
Calcium: 8.5 mg/dL — ABNORMAL LOW (ref 8.9–10.3)
Chloride: 106 mmol/L (ref 98–111)
Creatinine, Ser: 0.91 mg/dL (ref 0.44–1.00)
GFR calc Af Amer: >60 mL/min (ref >60)
GFR calc non Af Amer: 60 mL/min — ABNORMAL LOW (ref >60)
Glucose, Bld: 95 mg/dL (ref 70–99)
Potassium: 3.3 mmol/L — ABNORMAL LOW (ref 3.5–5.1)
Sodium: 139 mmol/L (ref 135–145)

## 2019-12-10 LAB — TROPONIN I (HIGH SENSITIVITY): Troponin I (High Sensitivity): 9 ng/L (ref <18)

## 2019-12-10 MED ORDER — AMLODIPINE BESYLATE 5 MG PO TABS
10.0000 mg | ORAL_TABLET | Freq: Once | ORAL | Status: AC
  Administered 2019-12-10: 10 mg via ORAL
  Filled 2019-12-10: qty 2

## 2019-12-10 MED ORDER — AMLODIPINE BESYLATE 10 MG PO TABS: 10.0000 mg | ORAL_TABLET | Freq: Every evening | ORAL | 0 refills | Status: DC

## 2019-12-10 NOTE — Discharge Instructions (Addendum)
Your high blood pressure may be related to the sinus infection you are dealing with.  It is also possible that it is related to the medications that you are taking for your infection.  However you need to talk to your specialist before you quit taking these medications.  If you have a potentially life-threatening infection, you may need to be on these antibiotics.  I started you on another blood pressure medicine called Norvasc.  You will take this 1 time in the evening, in addition to your normal blood pressure medicines.  Measure your blood pressure daily.  If your systolic pressure (top number) drops below 120 mm hg, STOP taking the Norvasc.  Follow up with your ENT specialist as scheduled for your sinus infection, and your primary care doctor about your high blood pressure.

## 2019-12-10 NOTE — ED Provider Notes (Signed)
[facility] EMERGENCY DEPARTMENT Provider Note   CSN: [id number] Arrival date & time: 12/10/19  [place]     History Chief Complaint  Patient presents with  . Hypertension    Dorothy Lee is a 79 y.o. female with history of hypertension on isosorbide 60 mg daily as well as metoprolol 25 mg daily, presenting to the emergency with high blood pressure.  She reports she is being treated as an outpatient for severe sinus infection and says "my doctor thinks it may be MRSA."  She says she has been on 2 combination antibiotics, from the medicine list she provides it appears to be vancomycin and clindamycin combination, which she says she sprays in her nose with a saline solution.  She was concerned this was driving up her blood pressures she briefly stopped the medication, however ENT doctor asked her to resume it yesterday.  She has noted for the past few days her blood pressures been higher than normal.  She says she has an average blood pressure around [id number] to [id number] systolic.  However yesterday and today she noted a systolic pressure about 123456 mmhg (highest today 230/125).  She has been compliant with her blood pressure medicines.  She reports that time feeling some dizziness and lightheadedness and intermittent chest pains.  She also notes at times that she has had some blurring of her vision bilaterally.  She currently has none of the symptoms.  She denies any recent large salty meals or increased dietary salt intake.  She denies any other medication use and says she has not been using Afrin or any vasoconstricting medicines.  HPI     Past Medical History:  Diagnosis Date  . Allergic rhinitis   . CAD (coronary artery disease)    DES RCA and LAD 02/2008, DES mid LAD 11/2012  . Diverticulitis   . Diverticulosis   . Essential hypertension   . GERD (gastroesophageal reflux disease)   . Glaucoma   . History of breast cancer    Right - s/p XRT 1984, surgery, no chemo  . Hyperlipidemia   .  Personal history of radiation therapy   . Rotator cuff syndrome, left   . Scoliosis   . Urinary incontinence     Patient Active Problem List   Diagnosis Date Noted  . Chest pain, musculoskeletal 12/17/2018  . Wound infection after surgery   . S/P partial colectomy 05/19/2018  . Sigmoid diverticulosis   . Dyspareunia in female 01/24/2018  . Diverticulitis of large intestine with perforation without abscess or bleeding   . Diverticulitis 11/17/2017  . Suprapubic abdominal pain 10/22/2017  . Lower urinary tract symptoms (LUTS) 10/22/2017  . Constipation 04/24/2017  . Fatty liver 04/24/2017  . Solitary pulmonary nodule 10/05/2016  . LLQ pain 08/10/2016  . History of colonic polyps   . Diverticulosis of colon without hemorrhage   . Dysphagia   . Esophageal dysphagia 01/05/2016  . Acute diverticulitis 01/05/2016  . Postoperative breast asymmetry 06/02/2015  . Cough variant asthma 01/19/2014  . Weight loss, unintentional 09/29/2013  . Upper airway cough syndrome 11/20/2010  . Chronic sinusitis 11/08/2010  . ALLERGIC RHINITIS 11/08/2010  . PALPITATIONS, HX OF 10/05/2010  . ROTATOR CUFF SYNDROME, LEFT 09/25/2010  . GERD 09/22/2010  . URINARY INCONTINENCE 09/22/2010  . Dyslipidemia 09/15/2010  . Essential hypertension 09/15/2010  . Coronary atherosclerosis 09/15/2010  . BREAST CANCER, HX OF 09/15/2010    Past Surgical History:  Procedure Laterality Date  . APPENDECTOMY  1981  . BREAST BIOPSY  [place]   right breast  . COLONOSCOPY  2008   Dr. Oletta Lamas: diverticulosis, hemorrhoids, conscious sedation  . COLONOSCOPY N/A 01/30/2016   Dr. Gala Romney: mild divertiuculosis in sigmoid colon. narrowing of the colon in association with the deverticular opening. 6 polyps removed, tubular adenoma. next TCS in 3 years  . CORONARY ANGIOPLASTY WITH STENT PLACEMENT  02/2008  . ESOPHAGOGASTRODUODENOSCOPY  2014   Dr. Fuller Plan: erosive gastritis, no h.pylori  .  ESOPHAGOGASTRODUODENOSCOPY N/A 01/30/2016   Dr. Gala Romney: normal esophagus s/p dilation, small hh.  Marland Kitchen LEFT HEART CATHETERIZATION WITH CORONARY ANGIOGRAM N/A 12/02/2012   Procedure: LEFT HEART CATHETERIZATION WITH CORONARY ANGIOGRAM;  Surgeon: Jettie Booze, MD;  Location: [facility] CATH LAB;  Service: Cardiovascular;  Laterality: N/A;  . Venia Minks DILATION N/A 01/30/2016   Procedure: Venia Minks DILATION;  Surgeon: Daneil Dolin, MD;  Location: [facility] ENDO SUITE;  Service: Endoscopy;  Laterality: N/A;  . NASAL SINUS SURGERY    . PARTIAL COLECTOMY N/A 05/19/2018   Procedure: PARTIAL COLECTOMY;  Surgeon: Aviva Signs, MD;  Location: [facility] ORS;  Service: General;  Laterality: N/A;  . PERCUTANEOUS CORONARY STENT INTERVENTION (PCI-S)  12/02/2012   Procedure: PERCUTANEOUS CORONARY STENT INTERVENTION (PCI-S);  Surgeon: Jettie Booze, MD;  Location: [facility] CATH LAB;  Service: Cardiovascular;;  DES to the MID LAD     OB History    Gravida  2   Para  1   Term  1   Preterm      AB  1   Living        SAB  1   TAB      Ectopic      Multiple      Live Births  1           Family History  Problem Relation Age of Onset  . Emphysema Father   . Emphysema Brother   . Heart disease Brother        x3 brothers  . Lung cancer Brother   . Throat cancer Brother        head/neck cancer  . Heart attack Brother   . Heart attack Sister   . Heart attack Son        blood clot  . Colon cancer Neg Hx     Social History   Tobacco Use  . Smoking status: Never Smoker  . Smokeless tobacco: Never Used  . Tobacco comment: Widowed, lives alone but has sig other.   Substance Use Topics  . Alcohol use: Not Currently  . Drug use: No    Home Medications Prior to Admission medications   Medication Sig Start Date End Date Taking? Authorizing Provider  amLODipine (NORVASC) 10 MG tablet Take 1 tablet (10 mg total) by mouth every evening for 60 doses. 12/10/19 02/08/20  Wyvonnia Dusky, MD  aspirin EC 81 MG  tablet Take 81 mg by mouth daily.     [provider]  ipratropium (ATROVENT) 0.06 % nasal spray Place 2 sprays into both nostrils 2 (two) times daily. 08/26/19   [provider]  isosorbide mononitrate (IMDUR) 60 MG 24 hr tablet Take 1 tablet (60 mg total) by mouth daily. 05/05/19 08/21/19  Satira Sark, MD  latanoprost (XALATAN) 0.005 % ophthalmic solution Place 1 drop into both eyes at bedtime.  07/28/14   [provider]  meloxicam (MOBIC) 15 MG tablet Take 15 mg by mouth daily. 08/18/19   [provider]  metoprolol succinate (  TOPROL-XL) 25 MG 24 hr tablet TAKE ONE (1) TABLET (25 MG TOTAL) BY MOUTH DAILY.  08/24/19   Satira Sark, MD  nitroGLYCERIN (NITROSTAT) 0.4 MG SL tablet Place 1 tablet (0.4 mg total) under the tongue every 5 (five) minutes x 3 doses as needed for chest pain. May repeat x3 05/05/19   Satira Sark, MD  pantoprazole (PROTONIX) 40 MG tablet TAKE ONE (1) TABLET (40 MG TOTAL) BY MOUTH DAILY.  11/06/19   Satira Sark, MD  rosuvastatin (CRESTOR) 10 MG tablet TAKE 1 TABLET BY MOUTH EVERY DAY  11/12/19   Satira Sark, MD  Wheat Dextrin (BENEFIBER PO) Take 1 Dose by mouth daily.    [provider]    Allergies    Crestor [rosuvastatin], Lipitor [atorvastatin], Lisinopril, and Simvastatin  Review of Systems   Review of Systems  Constitutional: Negative for chills and fever.  Eyes: Positive for visual disturbance. Negative for pain.  Respiratory: Negative for cough and shortness of breath.   Cardiovascular: Positive for chest pain. Negative for palpitations.  Gastrointestinal: Negative for abdominal pain and vomiting.  Musculoskeletal: Negative for arthralgias and back pain.  Skin: Negative for color change and rash.  Neurological: Positive for light-headedness and headaches. Negative for syncope.  All other systems reviewed and are negative.   Physical Exam Updated Vital Signs BP (!) 154/99   Pulse 81    Temp 98 F (36.7 C) (Oral)   Resp 19   Wt 66.7 kg   SpO2 100%   BMI 26.90 kg/m   Physical Exam Vitals and nursing note reviewed.  Constitutional:      General: She is not in acute distress.    Appearance: She is well-developed.  HENT:     Head: Normocephalic and atraumatic.  Eyes:     Conjunctiva/sclera: Conjunctivae normal.  Cardiovascular:     Rate and Rhythm: Normal rate and regular rhythm.     Pulses: Normal pulses.     Heart sounds: No murmur.  Pulmonary:     Effort: Pulmonary effort is normal. No respiratory distress.     Breath sounds: Normal breath sounds.  Abdominal:     Palpations: Abdomen is soft.     Tenderness: There is no abdominal tenderness.  Musculoskeletal:     Cervical back: Neck supple.  Skin:    General: Skin is warm and dry.  Neurological:     General: No focal deficit present.     Mental Status: She is alert and oriented to person, place, and time. Mental status is at baseline.     GCS: GCS eye subscore is 4. GCS verbal subscore is 5. GCS motor subscore is 6.     Cranial Nerves: Cranial nerves are intact.     Sensory: Sensation is intact.     Motor: Motor function is intact.     Coordination: Coordination is intact.     Gait: Gait is intact.  Psychiatric:        Mood and Affect: Mood normal.        Behavior: Behavior normal.     ED Results / Procedures / Treatments   Labs (all labs ordered are listed, but only abnormal results are displayed) Labs Reviewed  BASIC METABOLIC PANEL - Abnormal; Notable for the following components:      Result Value   Potassium 3.3 (*)    Calcium 8.5 (*)    GFR calc non Af Amer 60 (*)    All other components within normal limits  CBC  TROPONIN I (HIGH SENSITIVITY)    EKG EKG Interpretation  Date/Time:  Thursday December 10 2019 21:03:10 EST Ventricular Rate:  73 PR Interval:    QRS Duration: 94 QT Interval:  420 QTC Calculation: 463 R Axis:   -36 Text Interpretation: Sinus rhythm Atrial premature  complex Left axis deviation Abnormal R-wave progression, early transition Nonspecific T abnormalities, lateral leads Confirmed by Thayer Jew [phone]) on 12/11/2019 8:07:13 AM   Radiology CT Head Wo Contrast  Result Date: 12/10/2019 CLINICAL DATA:  Headache EXAM: CT HEAD WITHOUT CONTRAST TECHNIQUE: Contiguous axial images were obtained from the base of the skull through the vertex without intravenous contrast. COMPARISON:  None. FINDINGS: Brain: No evidence of acute infarction, hemorrhage, hydrocephalus, extra-axial collection or mass lesion/mass effect. There is mild diffuse low-attenuation within the subcortical and periventricular white matter compatible with chronic microvascular disease. Prominence of the CSF spaces and ventricles are noted compatible with brain atrophy. Vascular: No hyperdense vessel or unexpected calcification. Skull: Normal. Negative for fracture or focal lesion. Sinuses/Orbits: Bilateral median antrectomy and ethmoidectomies have been performed. There is bilateral mucoperiosteal thickening involving the maxillary sinuses. Air-fluid levels are noted in both maxillary sinuses as well as the frontal sinus. Mastoid air cells are clear. Other: None IMPRESSION: 1. No acute intracranial abnormalities. 2. Chronic small vessel ischemic disease and brain atrophy. 3. Sinus disease. Electronically Signed   By: Kerby Moors M.D.   On: 12/10/2019 21:01    Procedures Procedures (including critical care time)  Medications Ordered in ED Medications  amLODipine (NORVASC) tablet 10 mg (10 mg Oral Given 12/10/19 2107)    ED Course  I have reviewed the triage vital signs and the nursing notes.  Pertinent labs & imaging results that were available during my care of the patient were reviewed by me and considered in my medical decision making (see chart for details).  79 yo female who is undergoing outpatient management for MRSA sinusitis with ENT, presenting to ED with high blood pressure.   Normally runs 123XX123 mm hg systolic, she reports.  Past few days has been as high as 123456 mm hg systolic.  She has been compliant with her home meds.    I think this may be pain-related to her infection, possibly a side effect of her antibiotics (appears to be vanco + clindamycin combination).  I would not have her discontinue her antibiotics without discussing with here ENT doctor, given the possibility of a serious sinus infection.    However we can manage her BP here.  We'll try to add another agent, norvasc, and see how she does.  With her other intermittent symptoms of headache, blurred vision, CP at times, I think a w/u for hypertensive urgency is warranted.     ECG nonischemic.  Trop minimally elevated at 9 with multiple days of symptoms, I don't think this is ACS and I don't think we need to repeat this level.  Electrolytes look okay, only mild hypoK and hypoCa.  CTH with no acute stroke findings, only chronic sinus infection.   Clinical Course as of Dec 11 1243  Thu Dec 10, 2019  2255 Patient is feeling somewhat better.  Blood pressure is improved.  Discussed starting her on a third blood pressure medicine which is Norvasc which she will take in the evenings.  She will monitor her blood pressure at home.  Suspect this may be a side effect or related to her infection or possibly her antibiotics.  Do not believe she is having  hypertensive emergency at this time.   [MT]    Clinical Course User Index [MT] Adonus Uselman, Carola Rhine, MD    Final Clinical Impression(s) / ED Diagnoses Final diagnoses:  Secondary hypertension    Rx / DC Orders ED Discharge Orders         Ordered    amLODipine (NORVASC) 10 MG tablet  Every evening     12/10/19 2300           Wyvonnia Dusky, MD 12/11/19 1245

## 2019-12-10 NOTE — ED Triage Notes (Addendum)
Pt from home c/o elevated BP today.  Took it several times and said the "bottom number was 180"  Pt is alert and oriented. Takes Metoprolol at home. Said that she took her normal dose today. Denies any CP. Numbness/tingling

## 2019-12-30 ENCOUNTER — Other Ambulatory Visit (HOSPITAL_COMMUNITY): Payer: Self-pay | Admitting: Family Medicine

## 2019-12-30 DIAGNOSIS — N644 Mastodynia: Secondary | ICD-10-CM

## 2019-12-31 DIAGNOSIS — L570 Actinic keratosis: Secondary | ICD-10-CM | POA: Diagnosis not present

## 2019-12-31 DIAGNOSIS — L821 Other seborrheic keratosis: Secondary | ICD-10-CM | POA: Diagnosis not present

## 2020-01-04 DIAGNOSIS — H9202 Otalgia, left ear: Secondary | ICD-10-CM | POA: Diagnosis not present

## 2020-01-04 DIAGNOSIS — J324 Chronic pansinusitis: Secondary | ICD-10-CM | POA: Diagnosis not present

## 2020-01-04 DIAGNOSIS — M26622 Arthralgia of left temporomandibular joint: Secondary | ICD-10-CM | POA: Diagnosis not present

## 2020-01-07 ENCOUNTER — Encounter: Payer: Self-pay | Admitting: Cardiology

## 2020-01-07 ENCOUNTER — Ambulatory Visit (INDEPENDENT_AMBULATORY_CARE_PROVIDER_SITE_OTHER): Payer: Medicare Other | Admitting: Cardiology

## 2020-01-07 VITALS — BP 140/74 | HR 82 | Temp 98.2°F | Ht 62.0 in | Wt 142.0 lb

## 2020-01-07 DIAGNOSIS — I10 Essential (primary) hypertension: Secondary | ICD-10-CM

## 2020-01-07 DIAGNOSIS — I25119 Atherosclerotic heart disease of native coronary artery with unspecified angina pectoris: Secondary | ICD-10-CM | POA: Diagnosis not present

## 2020-01-07 NOTE — Progress Notes (Signed)
Cardiology Office Note  Date: 01/07/2020   ID: [name], DOB 13-Aug-1941, MRN [id number]  PCP:  [name], MD  Cardiologist:  [name], MD Electrophysiologist:  None   Chief Complaint  Patient presents with  . Cardiac follow-up    History of Present Illness: Dorothy Lee is an 79 y.o. female last seen in November 2020.  She presents for a follow-up visit.  I reviewed interval records, she was seen in the ER in early March with elevated blood pressure in the setting of an apparent sinus infection.  Norvasc was added to her baseline medical regimen.  She brought in subsequent blood pressure checks from home, trend looks much better.  She is tolerating Norvasc 10 mg daily and otherwise continues on Toprol-XL and Imdur as before.  She does not describe any angina symptoms.  I reviewed her ECG and lab work obtained at [place] visit.  Past Medical History:  Diagnosis Date  . Allergic rhinitis   . CAD (coronary artery disease)    DES RCA and LAD 02/2008, DES mid LAD 11/2012  . Diverticulitis   . Diverticulosis   . Essential hypertension   . GERD (gastroesophageal reflux disease)   . Glaucoma   . History of breast cancer    Right - s/p XRT 1984, surgery, no chemo  . Hyperlipidemia   . Personal history of radiation therapy   . Rotator cuff syndrome, left   . Scoliosis   . Urinary incontinence     Past Surgical History:  Procedure Laterality Date  . APPENDECTOMY  1981  . BREAST BIOPSY  1984  . BREAST LUMPECTOMY  1982   right breast  . COLONOSCOPY  2008   Dr. Oletta Lamas: diverticulosis, hemorrhoids, conscious sedation  . COLONOSCOPY N/A 01/30/2016   Dr. Gala Romney: mild divertiuculosis in sigmoid colon. narrowing of the colon in association with the deverticular opening. 6 polyps removed, tubular adenoma. next TCS in 3 years  . CORONARY ANGIOPLASTY WITH STENT PLACEMENT  02/2008  . ESOPHAGOGASTRODUODENOSCOPY  2014   Dr. Fuller Plan: erosive gastritis, no h.pylori  .  ESOPHAGOGASTRODUODENOSCOPY N/A 01/30/2016   Dr. Gala Romney: normal esophagus s/p dilation, small hh.  Marland Kitchen LEFT HEART CATHETERIZATION WITH CORONARY ANGIOGRAM N/A 12/02/2012   Procedure: LEFT HEART CATHETERIZATION WITH CORONARY ANGIOGRAM;  Surgeon: Jettie Booze, MD;  Location: [facility] CATH LAB;  Service: Cardiovascular;  Laterality: N/A;  . Venia Minks DILATION N/A 01/30/2016   Procedure: Venia Minks DILATION;  Surgeon: Daneil Dolin, MD;  Location: [facility] ENDO SUITE;  Service: Endoscopy;  Laterality: N/A;  . NASAL SINUS SURGERY    . PARTIAL COLECTOMY N/A 05/19/2018   Procedure: PARTIAL COLECTOMY;  Surgeon: Aviva Signs, MD;  Location: [facility] ORS;  Service: General;  Laterality: N/A;  . PERCUTANEOUS CORONARY STENT INTERVENTION (PCI-S)  12/02/2012   Procedure: PERCUTANEOUS CORONARY STENT INTERVENTION (PCI-S);  Surgeon: Jettie Booze, MD;  Location: [facility] CATH LAB;  Service: Cardiovascular;;  DES to the MID LAD    Current Outpatient Medications  Medication Sig Dispense Refill  . amLODipine (NORVASC) 10 MG tablet Take 1 tablet (10 mg total) by mouth every evening for 60 doses. 60 tablet 0  . aspirin EC 81 MG tablet Take 81 mg by mouth daily.     Marland Kitchen ipratropium (ATROVENT) 0.06 % nasal spray Place 2 sprays into both nostrils 2 (two) times daily.    . isosorbide mononitrate (IMDUR) 60 MG 24 hr tablet Take 1 tablet (60 mg total) by mouth daily. 90 tablet 3  .  latanoprost (XALATAN) 0.005 % ophthalmic solution Place 1 drop into both eyes at bedtime.     . meloxicam (MOBIC) 15 MG tablet Take 15 mg by mouth daily.    . metoprolol succinate (TOPROL-XL) 25 MG 24 hr tablet TAKE ONE (1) TABLET (25 MG TOTAL) BY MOUTH DAILY.  90 tablet 2  . nitroGLYCERIN (NITROSTAT) 0.4 MG SL tablet Place 1 tablet (0.4 mg total) under the tongue every 5 (five) minutes x 3 doses as needed for chest pain. May repeat x3 25 tablet 3  . pantoprazole (PROTONIX) 40 MG tablet TAKE ONE (1) TABLET (40 MG TOTAL) BY MOUTH DAILY.  90 tablet 1  . rosuvastatin  (CRESTOR) 10 MG tablet TAKE 1 TABLET BY MOUTH EVERY DAY  90 tablet 3  . Wheat Dextrin (BENEFIBER PO) Take 1 Dose by mouth daily.     No current facility-administered medications for this visit.   Allergies:  Crestor [rosuvastatin], Lipitor [atorvastatin], Lisinopril, and Simvastatin   ROS:   No nitroglycerin use.  Physical Exam: VS:  BP 140/74   Pulse 82   Temp 98.2 F (36.8 C)   Ht 5\' 2"  (1.575 m)   Wt 142 lb (64.4 kg)   SpO2 94%   BMI 25.97 kg/m , BMI Body mass index is 25.97 kg/m.  Wt Readings from Last 3 Encounters:  01/07/20 142 lb (64.4 kg)  12/10/19 147 lb 0.8 oz (66.7 kg)  08/21/19 147 lb (66.7 kg)    General: Elderly woman, appears comfortable at rest. HEENT: Conjunctiva and lids normal, wearing a mask. Neck: Supple, no elevated JVP or carotid bruits, no thyromegaly. Lungs: Clear to auscultation, nonlabored breathing at rest. Cardiac: Regular rate and rhythm, no S3 or significant systolic murmur, no pericardial rub. Abdomen: Soft, bowel sounds present. Extremities: No pitting edema, distal pulses 2+.  ECG:  An ECG dated 12/10/2019 was personally reviewed today and demonstrated:  Sinus rhythm with PAC, nonspecific ST-T changes.  Recent Labwork: 05/05/2019: ALT 14; AST 22 12/10/2019: BUN 17; Creatinine, Ser 0.91; Hemoglobin 13.0; Platelets 263; Potassium 3.3; Sodium 139     Component Value Date/Time   CHOL 167 05/05/2019 1124   TRIG 129 05/05/2019 1124   TRIG 119 05/16/2010 0000   HDL 45 (L) 05/05/2019 1124   CHOLHDL 3.7 05/05/2019 1124   VLDL 16.4 08/19/2014 1453   LDLCALC 99 05/05/2019 1124   LDLDIRECT 144.4 11/09/2013 0934    Other Studies Reviewed Today:  Echocardiogram 09/02/2018: Study Conclusions  - Left ventricle: The cavity size was normal. Systolic function was normal. The estimated ejection fraction was in the range of 60% to 65%. Wall motion was normal; there were no regional wall motion abnormalities. Left ventricular diastolic  function parameters were normal. - Aortic valve: Sclerosis without stenosis. - Atrial septum: No defect or patent foramen ovale was identified.  Lexiscan Myoview 11/05/2017:  No diagnostic ST segment changes to indicate ischemia.  Small, mild intensity, mid to apical septal defect that is fixed, more prominent at rest and stress. Suggestive of soft tissue attenuation rather than scar in light of normal wall motion.  This is a low risk study.  Nuclear stress EF: 72%.  Carotid Dopplers 11/05/2018: IMPRESSION: Less than 50% stenosis in the right and left internal carotid arteries.  Event monitor 11/03/2018: 7-day event recorder reviewed. Sinus rhythm was present throughout. Heart rate ranged from 56 bpm up to 115 bpm with average heart rate 72 bpm. There were no significant arrhythmias or pauses.  Assessment and Plan:  1.  Essential hypertension.  Agree with addition of Norvasc which she is currently tolerating, otherwise continue Toprol-XL.  2.  CAD status post DES to the RCA and LAD in 2009 with DES to the LAD in 2014.  No active angina reported.  Myoview from 2019 was low risk.  I reviewed her recent ECG.  Continue medical therapy including aspirin, Toprol-XL, Imdur and Crestor.  Medication Adjustments/Labs and Tests Ordered: Current medicines are reviewed at length with the patient today.  Concerns regarding medicines are outlined above.   Tests Ordered: No orders of the defined types were placed in this encounter.   Medication Changes: No orders of the defined types were placed in this encounter.   Disposition:  Follow up 6 months in the [place] office.  Signed, Satira Sark, MD, [facility] 01/07/2020 11:56 AM    [facility] [place]. [address], [place], [place] [address] Phone: [phone]; Fax: [phone]

## 2020-01-07 NOTE — Patient Instructions (Signed)
Medication Instructions:  °Your physician recommends that you continue on your current medications as directed. Please refer to the Current Medication list given to you today. ° °*If you need a refill on your cardiac medications before your next appointment, please call your pharmacy* ° ° °Lab Work: °None today °If you have labs (blood work) drawn today and your tests are completely normal, you will receive your results only by: °• MyChart Message (if you have MyChart) OR °• A paper copy in the mail °If you have any lab test that is abnormal or we need to change your treatment, we will call you to review the results. ° ° °Testing/Procedures: °None today ° ° °Follow-Up: °At [facility], you and your health needs are our priority.  As part of our continuing mission to provide you with exceptional heart care, we have created designated Provider Care Teams.  These Care Teams include your primary Cardiologist (physician) and Advanced Practice Providers (APPs -  Physician Assistants and Nurse Practitioners) who all work together to provide you with the care you need, when you need it. ° °We recommend signing up for the patient portal called "MyChart".  Sign up information is provided on this After Visit Summary.  MyChart is used to connect with patients for Virtual Visits (Telemedicine).  Patients are able to view lab/test results, encounter notes, upcoming appointments, etc.  Non-urgent messages can be sent to your provider as well.   °To learn more about what you can do with MyChart, go to [email or web address].   ° °Your next appointment:   °6 month(s) ° °The format for your next appointment:   °In Person ° °Provider:   °Samuel McDowell, MD ° ° °Other Instructions °None ° ° ° ° °Thank you for choosing [facility] ! ° ° ° ° ° ° ° ° °

## 2020-02-02 DIAGNOSIS — D485 Neoplasm of uncertain behavior of skin: Secondary | ICD-10-CM | POA: Diagnosis not present

## 2020-02-02 DIAGNOSIS — L812 Freckles: Secondary | ICD-10-CM | POA: Diagnosis not present

## 2020-02-02 DIAGNOSIS — D229 Melanocytic nevi, unspecified: Secondary | ICD-10-CM | POA: Diagnosis not present

## 2020-02-02 DIAGNOSIS — L570 Actinic keratosis: Secondary | ICD-10-CM | POA: Diagnosis not present

## 2020-02-02 DIAGNOSIS — L821 Other seborrheic keratosis: Secondary | ICD-10-CM | POA: Diagnosis not present

## 2020-02-02 DIAGNOSIS — L989 Disorder of the skin and subcutaneous tissue, unspecified: Secondary | ICD-10-CM | POA: Diagnosis not present

## 2020-02-02 DIAGNOSIS — D1801 Hemangioma of skin and subcutaneous tissue: Secondary | ICD-10-CM | POA: Diagnosis not present

## 2020-02-03 ENCOUNTER — Telehealth: Payer: Self-pay

## 2020-02-03 DIAGNOSIS — H401132 Primary open-angle glaucoma, bilateral, moderate stage: Secondary | ICD-10-CM | POA: Diagnosis not present

## 2020-02-03 MED ORDER — AMLODIPINE BESYLATE 5 MG PO TABS: 5.0000 mg | ORAL_TABLET | Freq: Every day | ORAL | 3 refills | Status: DC

## 2020-02-03 NOTE — Telephone Encounter (Signed)
Pt asking if she may take amlodipine every other day d/t feeling dizzy and having low BP's at times. Pt reports 102/72. States this does not happen daily just sometimes. Pt will continue to monitor BP. Please advise

## 2020-02-03 NOTE — Telephone Encounter (Signed)
Instead of taking Norvasc 10 mg every other day, I would suggest that we change the dose to 5 mg daily.

## 2020-02-03 NOTE — Telephone Encounter (Signed)
Pt has questions about her amlodipine   Please call [phone]   Thanks renee

## 2020-02-03 NOTE — Telephone Encounter (Signed)
Pt notified and voiced understanding 

## 2020-02-08 ENCOUNTER — Telehealth: Payer: Self-pay

## 2020-02-08 NOTE — Telephone Encounter (Signed)
Pt's husband came in for OV, requested for pharmacy to be changed to [facility] on [address] in [place]. [place] were in chart. [place] removed per his request.

## 2020-02-18 DIAGNOSIS — Z9109 Other allergy status, other than to drugs and biological substances: Secondary | ICD-10-CM | POA: Diagnosis not present

## 2020-02-22 ENCOUNTER — Ambulatory Visit: Payer: Medicare Other | Admitting: Cardiology

## 2020-02-23 ENCOUNTER — Ambulatory Visit: Payer: Medicare Other | Admitting: Cardiology

## 2020-02-24 DIAGNOSIS — I2510 Atherosclerotic heart disease of native coronary artery without angina pectoris: Secondary | ICD-10-CM | POA: Diagnosis not present

## 2020-02-24 DIAGNOSIS — I10 Essential (primary) hypertension: Secondary | ICD-10-CM | POA: Diagnosis not present

## 2020-02-24 DIAGNOSIS — Z7689 Persons encountering health services in other specified circumstances: Secondary | ICD-10-CM | POA: Diagnosis not present

## 2020-02-24 DIAGNOSIS — J320 Chronic maxillary sinusitis: Secondary | ICD-10-CM | POA: Diagnosis not present

## 2020-02-24 DIAGNOSIS — A4902 Methicillin resistant Staphylococcus aureus infection, unspecified site: Secondary | ICD-10-CM | POA: Diagnosis not present

## 2020-02-24 DIAGNOSIS — J479 Bronchiectasis, uncomplicated: Secondary | ICD-10-CM | POA: Diagnosis not present

## 2020-02-24 DIAGNOSIS — N644 Mastodynia: Secondary | ICD-10-CM | POA: Diagnosis not present

## 2020-02-25 DIAGNOSIS — H401132 Primary open-angle glaucoma, bilateral, moderate stage: Secondary | ICD-10-CM | POA: Diagnosis not present

## 2020-02-26 DIAGNOSIS — R9389 Abnormal findings on diagnostic imaging of other specified body structures: Secondary | ICD-10-CM | POA: Diagnosis not present

## 2020-03-08 DIAGNOSIS — S0993XS Unspecified injury of face, sequela: Secondary | ICD-10-CM | POA: Diagnosis not present

## 2020-03-08 DIAGNOSIS — W19XXXS Unspecified fall, sequela: Secondary | ICD-10-CM | POA: Diagnosis not present

## 2020-03-16 DIAGNOSIS — J324 Chronic pansinusitis: Secondary | ICD-10-CM | POA: Diagnosis not present

## 2020-03-16 DIAGNOSIS — I2510 Atherosclerotic heart disease of native coronary artery without angina pectoris: Secondary | ICD-10-CM | POA: Diagnosis not present

## 2020-03-16 DIAGNOSIS — R05 Cough: Secondary | ICD-10-CM | POA: Diagnosis not present

## 2020-03-16 DIAGNOSIS — T5994XS Toxic effect of unspecified gases, fumes and vapors, undetermined, sequela: Secondary | ICD-10-CM | POA: Diagnosis not present

## 2020-03-16 DIAGNOSIS — Z853 Personal history of malignant neoplasm of breast: Secondary | ICD-10-CM | POA: Diagnosis not present

## 2020-03-16 DIAGNOSIS — R9389 Abnormal findings on diagnostic imaging of other specified body structures: Secondary | ICD-10-CM | POA: Diagnosis not present

## 2020-03-16 DIAGNOSIS — J449 Chronic obstructive pulmonary disease, unspecified: Secondary | ICD-10-CM | POA: Diagnosis not present

## 2020-03-22 DIAGNOSIS — I10 Essential (primary) hypertension: Secondary | ICD-10-CM | POA: Diagnosis not present

## 2020-03-22 DIAGNOSIS — N3946 Mixed incontinence: Secondary | ICD-10-CM | POA: Diagnosis not present

## 2020-03-22 DIAGNOSIS — Z0000 Encounter for general adult medical examination without abnormal findings: Secondary | ICD-10-CM | POA: Diagnosis not present

## 2020-03-22 DIAGNOSIS — S0993XS Unspecified injury of face, sequela: Secondary | ICD-10-CM | POA: Diagnosis not present

## 2020-04-26 ENCOUNTER — Other Ambulatory Visit: Payer: Self-pay | Admitting: Cardiology

## 2020-05-05 DIAGNOSIS — J324 Chronic pansinusitis: Secondary | ICD-10-CM | POA: Diagnosis not present

## 2020-05-16 DIAGNOSIS — M19011 Primary osteoarthritis, right shoulder: Secondary | ICD-10-CM | POA: Diagnosis not present

## 2020-05-16 DIAGNOSIS — M47816 Spondylosis without myelopathy or radiculopathy, lumbar region: Secondary | ICD-10-CM | POA: Diagnosis not present

## 2020-05-16 DIAGNOSIS — M5136 Other intervertebral disc degeneration, lumbar region: Secondary | ICD-10-CM | POA: Diagnosis not present

## 2020-05-16 DIAGNOSIS — M4180 Other forms of scoliosis, site unspecified: Secondary | ICD-10-CM | POA: Diagnosis not present

## 2020-05-24 DIAGNOSIS — H401133 Primary open-angle glaucoma, bilateral, severe stage: Secondary | ICD-10-CM | POA: Diagnosis not present

## 2020-05-30 ENCOUNTER — Encounter: Payer: Self-pay | Admitting: Physician Assistant

## 2020-05-30 ENCOUNTER — Ambulatory Visit (INDEPENDENT_AMBULATORY_CARE_PROVIDER_SITE_OTHER): Payer: Medicare Other | Admitting: Physician Assistant

## 2020-05-30 ENCOUNTER — Telehealth: Payer: Self-pay | Admitting: Cardiology

## 2020-05-30 ENCOUNTER — Other Ambulatory Visit (HOSPITAL_COMMUNITY)
Admission: RE | Admit: 2020-05-30 | Discharge: 2020-05-30 | Disposition: A | Discharge status: Home / Self Care | Payer: Medicare Other | Source: Ambulatory Visit | Attending: Physician Assistant | Admitting: Physician Assistant

## 2020-05-30 ENCOUNTER — Other Ambulatory Visit: Payer: Self-pay

## 2020-05-30 VITALS — BP 140/80 | HR 74 | Ht 62.0 in | Wt 142.0 lb

## 2020-05-30 DIAGNOSIS — I10 Essential (primary) hypertension: Secondary | ICD-10-CM | POA: Diagnosis not present

## 2020-05-30 DIAGNOSIS — I2510 Atherosclerotic heart disease of native coronary artery without angina pectoris: Secondary | ICD-10-CM

## 2020-05-30 DIAGNOSIS — E785 Hyperlipidemia, unspecified: Secondary | ICD-10-CM | POA: Diagnosis not present

## 2020-05-30 DIAGNOSIS — I25119 Atherosclerotic heart disease of native coronary artery with unspecified angina pectoris: Secondary | ICD-10-CM

## 2020-05-30 LAB — COMPREHENSIVE METABOLIC PANEL
ALT: 25 U/L (ref 0–44)
AST: 26 U/L (ref 15–41)
Albumin: 4.1 g/dL (ref 3.5–5.0)
Alkaline Phosphatase: 62 U/L (ref 38–126)
Anion gap: 10 (ref 5–15)
BUN: 18 mg/dL (ref 8–23)
CO2: 28 mmol/L (ref 22–32)
Calcium: 9.1 mg/dL (ref 8.9–10.3)
Chloride: 103 mmol/L (ref 98–111)
Creatinine, Ser: 0.89 mg/dL (ref 0.44–1.00)
GFR calc Af Amer: >60 mL/min (ref >60)
GFR calc non Af Amer: >60 mL/min (ref >60)
Glucose, Bld: 91 mg/dL (ref 70–99)
Potassium: 3.9 mmol/L (ref 3.5–5.1)
Sodium: 141 mmol/L (ref 135–145)
Total Bilirubin: 0.8 mg/dL (ref 0.3–1.2)
Total Protein: 7.3 g/dL (ref 6.5–8.1)

## 2020-05-30 LAB — CBC
HCT: 43.9 % (ref 36.0–46.0)
Hemoglobin: 14 g/dL (ref 12.0–15.0)
MCH: 30 pg (ref 26.0–34.0)
MCHC: 31.9 g/dL (ref 30.0–36.0)
MCV: 94 fL (ref 80.0–100.0)
Platelets: 257 10*3/uL (ref 150–400)
RBC: 4.67 MIL/uL (ref 3.87–5.11)
RDW: 12.7 % (ref 11.5–15.5)
WBC: 7.5 10*3/uL (ref 4.0–10.5)
nRBC: 0 % (ref 0.0–0.2)

## 2020-05-30 LAB — LIPID PANEL
Cholesterol: 181 mg/dL (ref 0–200)
HDL: 59 mg/dL (ref >40)
LDL Cholesterol: 99 mg/dL (ref 0–99)
Total CHOL/HDL Ratio: 3.1 RATIO
Triglycerides: 115 mg/dL (ref <150)
VLDL: 23 mg/dL (ref 0–40)

## 2020-05-30 LAB — TSH: TSH: 1.583 u[IU]/mL (ref 0.350–4.500)

## 2020-05-30 NOTE — Patient Instructions (Signed)
Medication Instructions:  Your physician recommends that you continue on your current medications as directed. Please refer to the Current Medication list given to you today.  *If you need a refill on your cardiac medications before your next appointment, please call your pharmacy*   Lab Work: Your physician recommends that you return for lab work in: Fasting   If you have labs (blood work) drawn today and your tests are completely normal, you will receive your results only by:  [place] (if you have [place]) OR  A paper copy in the mail If you have any lab test that is abnormal or we need to change your treatment, we will call you to review the results.   Testing/Procedures: NONE     Follow-Up: At [facility], you and your health needs are our priority.  As part of our continuing mission to provide you with exceptional heart care, we have created designated Provider Care Teams.  These Care Teams include your primary Cardiologist (physician) and Advanced Practice Providers (APPs -  Physician Assistants and Nurse Practitioners) who all work together to provide you with the care you need, when you need it.  We recommend signing up for the patient portal called "MyChart".  Sign up information is provided on this After Visit Summary.  MyChart is used to connect with patients for Virtual Visits (Telemedicine).  Patients are able to view lab/test results, encounter notes, upcoming appointments, etc.  Non-urgent messages can be sent to your provider as well.   To learn more about what you can do with MyChart, go to [email or web address].    Your next appointment:   3 week(s)  The format for your next appointment:   In Person  Provider:   Ermalinda Barrios, PA-C   Other Instructions Thank you for choosing [facility]!

## 2020-05-30 NOTE — Telephone Encounter (Signed)
New message   Pt c/o BP issue: STAT if pt c/o blurred vision, one-sided weakness or slurred speech  1. What are your last 5 BP readings? 119/64 106/? 131/? 107/? 124/? 93/68  2. Are you having any other symptoms (ex. Dizziness, headache, blurred vision, passed out)? No feels a little woozy and she has no energy  3. What is your BP issue? bp keeps going up and down they are getting ready to leave for [place] and doesn't want to go without getting this addressed

## 2020-05-30 NOTE — Progress Notes (Signed)
Cardiology Office Note    Date:  05/30/2020   ID:  Dorothy Lee, DOB [birth year], MRN [id number]  PCP:  [name], MD  Cardiologist: [name], MD EPS: None  Chief Complaint  Patient presents with  . Hypertension  . Hypotension    History of Present Illness:  Dorothy Lee is a 79 y.o. female with history of CAD S/P DES RCA and LAD 02/2008, DES mid LAD 11/2012, low risk myoview 2019, HTN, HLD  Saw Dr. Domenic Polite 01/2020 and doing weill on amlodipine which was added for elevated BP in setting of sinus infection.  Patient says past 2 days BP running low and has no energy but today BP up this am 167/80's. Says 2 yrs ago when she and a little dizzy. Similar to when she was in [place] walking in the heat she went into cool air and passed out. It happened in [place] same situation 2 yrs ago. & day monitor without arrhythmia.  Says she doesn't drink enough water, but eats well. No recent weight loss, GI symptoms, bleeding problems, palpitations, chest pain     Past Medical History:  Diagnosis Date  . Allergic rhinitis   . CAD (coronary artery disease)    DES RCA and LAD 02/2008, DES mid LAD 11/2012  . Diverticulitis   . Diverticulosis   . Essential hypertension   . GERD (gastroesophageal reflux disease)   . Glaucoma   . History of breast cancer    Right - s/p XRT 1984, surgery, no chemo  . Hyperlipidemia   . Personal history of radiation therapy   . Rotator cuff syndrome, left   . Scoliosis   . Urinary incontinence     Past Surgical History:  Procedure Laterality Date  . APPENDECTOMY  1981  . BREAST BIOPSY  1984  . BREAST LUMPECTOMY  1982   right breast  . COLONOSCOPY  2008   Dr. Oletta Lamas: diverticulosis, hemorrhoids, conscious sedation  . COLONOSCOPY N/A 01/30/2016   Dr. Gala Romney: mild divertiuculosis in sigmoid colon. narrowing of the colon in association with the deverticular opening. 6 polyps removed, tubular adenoma. next TCS in 3 years  . CORONARY ANGIOPLASTY WITH  STENT PLACEMENT  02/2008  . ESOPHAGOGASTRODUODENOSCOPY  2014   Dr. Fuller Plan: erosive gastritis, no h.pylori  . ESOPHAGOGASTRODUODENOSCOPY N/A 01/30/2016   Dr. Gala Romney: normal esophagus s/p dilation, small hh.  Marland Kitchen LEFT HEART CATHETERIZATION WITH CORONARY ANGIOGRAM N/A 12/02/2012   Procedure: LEFT HEART CATHETERIZATION WITH CORONARY ANGIOGRAM;  Surgeon: Jettie Booze, MD;  Location: [facility] CATH LAB;  Service: Cardiovascular;  Laterality: N/A;  . Venia Minks DILATION N/A 01/30/2016   Procedure: Venia Minks DILATION;  Surgeon: Daneil Dolin, MD;  Location: [facility] ENDO SUITE;  Service: Endoscopy;  Laterality: N/A;  . NASAL SINUS SURGERY    . PARTIAL COLECTOMY N/A 05/19/2018   Procedure: PARTIAL COLECTOMY;  Surgeon: Aviva Signs, MD;  Location: [facility] ORS;  Service: General;  Laterality: N/A;  . PERCUTANEOUS CORONARY STENT INTERVENTION (PCI-S)  12/02/2012   Procedure: PERCUTANEOUS CORONARY STENT INTERVENTION (PCI-S);  Surgeon: Jettie Booze, MD;  Location: [facility] CATH LAB;  Service: Cardiovascular;;  DES to the MID LAD    Current Medications: Current Meds  Medication Sig  . amLODipine (NORVASC) 5 MG tablet Take 1 tablet (5 mg total) by mouth daily.  Marland Kitchen aspirin EC 81 MG tablet Take 81 mg by mouth daily.   Marland Kitchen ipratropium (ATROVENT) 0.06 % nasal spray Place 2 sprays into both nostrils 2 (two) times daily.  Marland Kitchen  isosorbide mononitrate (IMDUR) 60 MG 24 hr tablet Take 1 tablet (60 mg total) by mouth daily.  Marland Kitchen latanoprost (XALATAN) 0.005 % ophthalmic solution Place 1 drop into both eyes at bedtime.   . meloxicam (MOBIC) 15 MG tablet Take 15 mg by mouth daily.  . metoprolol succinate (TOPROL-XL) 25 MG 24 hr tablet TAKE ONE (1) TABLET (25 MG TOTAL) BY MOUTH DAILY.   . nitroGLYCERIN (NITROSTAT) 0.4 MG SL tablet Place 1 tablet (0.4 mg total) under the tongue every 5 (five) minutes x 3 doses as needed for chest pain. May repeat x3  . pantoprazole (PROTONIX) 40 MG tablet TAKE 1 TABLET BY MOUTH DAILY  . rosuvastatin (CRESTOR) 10 MG  tablet TAKE 1 TABLET BY MOUTH EVERY DAY   . Wheat Dextrin (BENEFIBER PO) Take 1 Dose by mouth daily.     Allergies:   Crestor [rosuvastatin], Lipitor [atorvastatin], Lisinopril, and Simvastatin   Social History   Socioeconomic History  . Marital status: Married    Spouse name: Not on file  . Number of children: 1  . Years of education: Not on file  . Highest education level: Not on file  Occupational History  . Occupation: Probation officer  . Occupation: COSMETOLOGY    Employer: [employer]  Tobacco Use  . Smoking status: Never Smoker  . Smokeless tobacco: Never Used  . Tobacco comment: Widowed, lives alone but has sig other.   Vaping Use  . Vaping Use: Never used  Substance and Sexual Activity  . Alcohol use: Not Currently  . Drug use: No  . Sexual activity: Yes    Birth control/protection: Post-menopausal  Other Topics Concern  . Not on file  Social History Narrative   Widowed, husband died.    Lives alone but has significant other, married in 01-24-2016 to [place].    Had one son, but he passed away.    Retired as Probation officer - Looking ahead [employer] and part-time at [employer]   Son died in 01-24-2016 of AMI. Has two grandsons.       Social Determinants of Health   Financial Resource Strain:   . Difficulty of Paying Living Expenses: Not on file  Food Insecurity:   . Worried About Charity fundraiser in the Last Year: Not on file  . Ran Out of Food in the Last Year: Not on file  Transportation Needs:   . Lack of Transportation (Medical): Not on file  . Lack of Transportation (Non-Medical): Not on file  Physical Activity:   . Days of Exercise per Week: Not on file  . Minutes of Exercise per Session: Not on file  Stress:   . Feeling of Stress : Not on file  Social Connections:   . Frequency of Communication with Friends and Family: Not on file  . Frequency of Social Gatherings with Friends and Family: Not on file  . Attends Religious Services: Not on file    . Active Member of Clubs or Organizations: Not on file  . Attends Archivist Meetings: Not on file  . Marital Status: Not on file     Family History:  The patient's family history includes Emphysema in her brother and father; Heart attack in her brother, sister, and son; Heart disease in her brother; Lung cancer in her brother; Throat cancer in her brother.   ROS:   Please see the history of present illness.    ROS All other systems reviewed and are negative.  PHYSICAL EXAM:   VS:  BP 140/80   Pulse 74   Ht 5\' 2"  (1.575 m)   Wt 142 lb (64.4 kg)   SpO2 96%   BMI 25.97 kg/m   Physical Exam  GEN: Well nourished, well developed, in no acute distress  Neck: no JVD, carotid bruits, or masses Cardiac:RRR; no murmurs, rubs, or gallops  Respiratory:  clear to auscultation bilaterally, normal work of breathing GI: soft, nontender, nondistended, + BS Ext: without cyanosis, clubbing, or edema, Good distal pulses bilaterally Neuro:  Alert and Oriented x 3 Psych: euthymic mood, full affect  Wt Readings from Last 3 Encounters:  05/30/20 142 lb (64.4 kg)  01/07/20 142 lb (64.4 kg)  12/10/19 147 lb 0.8 oz (66.7 kg)      Studies/Labs Reviewed:   EKG:  EKG is not ordered today.   Recent Labs: 12/10/2019: BUN 17; Creatinine, Ser 0.91; Hemoglobin 13.0; Platelets 263; Potassium 3.3; Sodium 139   Lipid Panel    Component Value Date/Time   CHOL 167 05/05/2019 1124   TRIG 129 05/05/2019 1124   TRIG 119 05/16/2010 0000   HDL 45 (L) 05/05/2019 1124   CHOLHDL 3.7 05/05/2019 1124   VLDL 16.4 08/19/2014 1453   LDLCALC 99 05/05/2019 1124   LDLDIRECT 144.4 11/09/2013 0934    Additional studies/ records that were reviewed today include:  Echocardiogram 09/02/2018: Study Conclusions   - Left ventricle: The cavity size was normal. Systolic function was   normal. The estimated ejection fraction was in the range of 60%   to 65%. Wall motion was normal; there were no regional  wall   motion abnormalities. Left ventricular diastolic function   parameters were normal. - Aortic valve: Sclerosis without stenosis. - Atrial septum: No defect or patent foramen ovale was identified.   Lexiscan Myoview 11/05/2017:  No diagnostic ST segment changes to indicate ischemia.  Small, mild intensity, mid to apical septal defect that is fixed, more prominent at rest and stress. Suggestive of soft tissue attenuation rather than scar in light of normal wall motion.  This is a low risk study.  Nuclear stress EF: 72%.   Carotid Dopplers 11/05/2018: IMPRESSION: Less than 50% stenosis in the right and left internal carotid arteries.   Event monitor 11/03/2018: 7-day event recorder reviewed.  Sinus rhythm was present throughout. Heart rate ranged from 56 bpm up to 115 bpm with average heart rate 72 bpm.  There were no significant arrhythmias or pauses.       ASSESSMENT:    1. Essential hypertension   2. Coronary artery disease involving native coronary artery of native heart without angina pectoris   3. Hyperlipidemia, unspecified hyperlipidemia type      PLAN:  In order of problems listed above:  Essential HTN BP up today but runs low at times at home with associated decrease in energy and feeling whoozy. Not orthostatic today. Doesn't drink enough water. I asked her to keep track of BP's, increase water intake, hold amlodipine if BP running below 024 systolic and check labs today. F/u in a couple weeks when she returns from [place].  CAD DES RCA and LAD 02/2008, DES mid LAD 11/2012, low risk myoview 2019-no angina  HLD on crestor   Medication Adjustments/Labs and Tests Ordered: Current medicines are reviewed at length with the patient today.  Concerns regarding medicines are outlined above.  Medication changes, Labs and Tests ordered today are listed in the Patient Instructions below. Patient Instructions  Medication Instructions:  Your physician  recommends that you  continue on your current medications as directed. Please refer to the Current Medication list given to you today.  *If you need a refill on your cardiac medications before your next appointment, please call your pharmacy*   Lab Work: Your physician recommends that you return for lab work in: Fasting   If you have labs (blood work) drawn today and your tests are completely normal, you will receive your results only by: Marland Kitchen MyChart Message (if you have MyChart) OR . A paper copy in the mail If you have any lab test that is abnormal or we need to change your treatment, we will call you to review the results.   Testing/Procedures: NONE     Follow-Up: At [facility], you and your health needs are our priority.  As part of our continuing mission to provide you with exceptional heart care, we have created designated Provider Care Teams.  These Care Teams include your primary Cardiologist (physician) and Advanced Practice Providers (APPs -  Physician Assistants and Nurse Practitioners) who all work together to provide you with the care you need, when you need it.  We recommend signing up for the patient portal called "MyChart".  Sign up information is provided on this After Visit Summary.  MyChart is used to connect with patients for Virtual Visits (Telemedicine).  Patients are able to view lab/test results, encounter notes, upcoming appointments, etc.  Non-urgent messages can be sent to your provider as well.   To learn more about what you can do with MyChart, go to [email or web address].    Your next appointment:   3 week(s)  The format for your next appointment:   In Person  Provider:   Ermalinda Barrios, PA-C   Other Instructions Thank you for choosing [facility]!       Sumner Boast, PA-C  05/30/2020 12:03 PM    [facility] [place], [place], [place]  [address] Phone: [phone]; Fax: [phone]

## 2020-05-30 NOTE — Telephone Encounter (Signed)
Pt coming in to see Ermalinda Barrios today

## 2020-05-31 ENCOUNTER — Telehealth: Payer: Self-pay | Admitting: *Deleted

## 2020-05-31 DIAGNOSIS — E785 Hyperlipidemia, unspecified: Secondary | ICD-10-CM

## 2020-05-31 MED ORDER — ROSUVASTATIN CALCIUM 20 MG PO TABS: 20.0000 mg | ORAL_TABLET | Freq: Every day | ORAL | 3 refills | Status: DC

## 2020-05-31 NOTE — Telephone Encounter (Signed)
Pt notified and order placed 

## 2020-05-31 NOTE — Telephone Encounter (Signed)
Pt seen in office

## 2020-05-31 NOTE — Telephone Encounter (Signed)
-----   Message from Imogene Burn, PA-C sent at 05/30/2020  2:32 PM EDT ----- Labs all normal except LDL 99 above goal of 70.  Increase Crestor to 20 mg once daily repeat fasting lipids in 3 months

## 2020-06-03 ENCOUNTER — Telehealth: Payer: Self-pay | Admitting: Student

## 2020-06-03 NOTE — Telephone Encounter (Signed)
Returned pt call. No answer. Left msg to call back.  

## 2020-06-03 NOTE — Telephone Encounter (Signed)
Pt states med changes (cholesterol) is making her dizzy. Pt states she feels as though she may pass out.    Please call [phone]    thanks

## 2020-06-06 NOTE — Telephone Encounter (Signed)
New message     Patient returning call - she wants to know if her cholesterol being high , would that make her dizzy

## 2020-06-07 NOTE — Telephone Encounter (Signed)
Left message to return call 

## 2020-06-07 NOTE — Telephone Encounter (Signed)
Pt notified that cholesterol is not the cause of her dizziness.

## 2020-06-08 ENCOUNTER — Other Ambulatory Visit: Payer: Self-pay | Admitting: Cardiology

## 2020-06-11 ENCOUNTER — Other Ambulatory Visit: Payer: Self-pay | Admitting: Cardiology

## 2020-06-15 ENCOUNTER — Telehealth: Payer: Self-pay | Admitting: *Deleted

## 2020-06-15 NOTE — Telephone Encounter (Signed)
Please make sure she is drinking enough water and staying hydrated. Her BP was high at office visit and she wasn't orthostatic. She should see her PCP about other causes of dizziness. Her BP is good today and not low. She could try to decrease amlodipine to 5 mg 1/2 tablet to see if it makes any difference but needs to keep a close check on her BP since it tends to go up. Thanks. Selinda Eon

## 2020-06-15 NOTE — Telephone Encounter (Signed)
Pt was concerned with dizziness - had recent labs done that were normal other than cholesterol which was addressed in previous phone note - BP 112/52 HR 75 this afternoon - denies any other symptoms and was wondering if she could decrease BP medication to see if this would help symptoms - requested this message be sent to Estella Husk, PA

## 2020-06-15 NOTE — Telephone Encounter (Signed)
Pt voiced understanding and will continue to monitor BP and contact pcp if decreasing amlodipine doesn't help - updated medication list

## 2020-06-21 DIAGNOSIS — R42 Dizziness and giddiness: Secondary | ICD-10-CM | POA: Diagnosis not present

## 2020-06-21 DIAGNOSIS — Z23 Encounter for immunization: Secondary | ICD-10-CM | POA: Diagnosis not present

## 2020-07-05 DIAGNOSIS — J302 Other seasonal allergic rhinitis: Secondary | ICD-10-CM | POA: Diagnosis not present

## 2020-07-05 DIAGNOSIS — R42 Dizziness and giddiness: Secondary | ICD-10-CM | POA: Diagnosis not present

## 2020-07-05 DIAGNOSIS — G450 Vertebro-basilar artery syndrome: Secondary | ICD-10-CM | POA: Diagnosis not present

## 2020-07-05 DIAGNOSIS — Z23 Encounter for immunization: Secondary | ICD-10-CM | POA: Diagnosis not present

## 2020-07-05 DIAGNOSIS — K219 Gastro-esophageal reflux disease without esophagitis: Secondary | ICD-10-CM | POA: Diagnosis not present

## 2020-07-05 DIAGNOSIS — I951 Orthostatic hypotension: Secondary | ICD-10-CM | POA: Diagnosis not present

## 2020-07-05 DIAGNOSIS — I25118 Atherosclerotic heart disease of native coronary artery with other forms of angina pectoris: Secondary | ICD-10-CM | POA: Diagnosis not present

## 2020-07-07 ENCOUNTER — Telehealth: Payer: Self-pay | Admitting: Cardiology

## 2020-07-07 NOTE — Telephone Encounter (Signed)
New message     Patient wanted to let Dr Domenic Polite that she is having a lot of issues with dizziness the last 2 days, she brought it up to PCP and they said to speak to Dr Domenic Polite about it   STAT if patient feels like he/she is going to faint   1) Are you dizzy now? yes  2) Do you feel faint or have you passed out? No - she sits down or lays down and the dizziness will eventually go away  3) Do you have any other symptoms? No other symptoms -it started when she sat up this morning to get out of bed , she said they episodes havent been as bad after she took her meds   4) Have you checked your HR and BP (record if available)? 164/84 89 150/78 90 140/81 88

## 2020-07-07 NOTE — Telephone Encounter (Signed)
I spoke with patient. She states she has chronic sinus infections, has had sinus CT's add had has sinus surgery. She describes room spinning around her for past two days.Is schedule by ENT to have hearing test. She is going to call ENT and let him know of these new symptoms.

## 2020-07-08 ENCOUNTER — Telehealth: Payer: Self-pay | Admitting: Cardiology

## 2020-07-08 ENCOUNTER — Encounter: Payer: Self-pay | Admitting: Cardiology

## 2020-07-08 ENCOUNTER — Telehealth (INDEPENDENT_AMBULATORY_CARE_PROVIDER_SITE_OTHER): Payer: Medicare Other | Admitting: Cardiology

## 2020-07-08 ENCOUNTER — Other Ambulatory Visit: Payer: Self-pay

## 2020-07-08 VITALS — BP 140/81 | HR 88 | Ht 62.5 in | Wt 140.0 lb

## 2020-07-08 DIAGNOSIS — I25119 Atherosclerotic heart disease of native coronary artery with unspecified angina pectoris: Secondary | ICD-10-CM | POA: Diagnosis not present

## 2020-07-08 DIAGNOSIS — I10 Essential (primary) hypertension: Secondary | ICD-10-CM | POA: Diagnosis not present

## 2020-07-08 DIAGNOSIS — E782 Mixed hyperlipidemia: Secondary | ICD-10-CM

## 2020-07-08 MED ORDER — AMLODIPINE BESYLATE 2.5 MG PO TABS: 2.5000 mg | ORAL_TABLET | Freq: Every day | ORAL | 3 refills | Status: DC

## 2020-07-08 MED ORDER — NITROGLYCERIN 0.4 MG SL SUBL: 0.4000 mg | SUBLINGUAL_TABLET | SUBLINGUAL | 3 refills | Status: DC | PRN

## 2020-07-08 NOTE — Progress Notes (Signed)
Virtual Visit via Telephone Note   This visit type was conducted due to national recommendations for restrictions regarding the COVID-19 Pandemic (e.g. social distancing) in an effort to limit this patient's exposure and mitigate transmission in our community.  Due to her co-morbid illnesses, this patient is at least at moderate risk for complications without adequate follow up.  This format is felt to be most appropriate for this patient at this time.  The patient did not have access to video technology/had technical difficulties with video requiring transitioning to audio format only (telephone).  All issues noted in this document were discussed and addressed.  No physical exam could be performed with this format.  Please refer to the patient's chart for her  consent to telehealth for [facility].    Date:  07/08/2020   ID:  Dorothy Lee, DOB 1940/12/05, MRN [id number] The patient was identified using 2 identifiers.  Patient Location: Home Provider Location: Office/Clinic  PCP:  [name], MD  Cardiologist:  [name], MD Electrophysiologist:  None   Evaluation Performed:  Follow-Up Visit  Chief Complaint:  Cardiac follow-up  History of Present Illness:    Dorothy Lee is a 79 y.o. female last seen in August by Ms. Bonnell Public PA-C.  We spoke by phone today.  She is in the midst of a bout of vertigo, has a call in to her ENT specialist.  She describes a sense of spinning when she turns her head and nausea, has had no palpitations or syncope.  From a cardiac perspective she does not describe any obvious angina symptoms and has not used nitroglycerin.  Prior to vertigo symptoms she was exercising at the gym 2 to 3 days a week, mainly doing light weights.  We went over her medications, she had inadvertently stopped Norvasc which we will plan to resume at 2.5 mg daily based on review of her home blood pressure readings.  Crestor has been uptitrated.  I reviewed her recent lab  work from August as noted below.   Past Medical History:  Diagnosis Date  . Allergic rhinitis   . CAD (coronary artery disease)    DES RCA and LAD 02/2008, DES mid LAD 11/2012  . Diverticulitis   . Diverticulosis   . Essential hypertension   . GERD (gastroesophageal reflux disease)   . Glaucoma   . History of breast cancer    Right - s/p XRT 1984, surgery, no chemo  . Hyperlipidemia   . Personal history of radiation therapy   . Rotator cuff syndrome, left   . Scoliosis   . Urinary incontinence    Past Surgical History:  Procedure Laterality Date  . APPENDECTOMY  1981  . BREAST BIOPSY  1984  . BREAST LUMPECTOMY  1982   right breast  . COLONOSCOPY  2008   Dr. Oletta Lamas: diverticulosis, hemorrhoids, conscious sedation  . COLONOSCOPY N/A 01/30/2016   Dr. Gala Romney: mild divertiuculosis in sigmoid colon. narrowing of the colon in association with the deverticular opening. 6 polyps removed, tubular adenoma. next TCS in 3 years  . CORONARY ANGIOPLASTY WITH STENT PLACEMENT  02/2008  . ESOPHAGOGASTRODUODENOSCOPY  2014   Dr. Fuller Plan: erosive gastritis, no h.pylori  . ESOPHAGOGASTRODUODENOSCOPY N/A 01/30/2016   Dr. Gala Romney: normal esophagus s/p dilation, small hh.  Marland Kitchen LEFT HEART CATHETERIZATION WITH CORONARY ANGIOGRAM N/A 12/02/2012   Procedure: LEFT HEART CATHETERIZATION WITH CORONARY ANGIOGRAM;  Surgeon: Jettie Booze, MD;  Location: [facility] CATH LAB;  Service: Cardiovascular;  Laterality: N/A;  .  MALONEY DILATION N/A 01/30/2016   Procedure: Venia Minks DILATION;  Surgeon: Daneil Dolin, MD;  Location: [facility] ENDO SUITE;  Service: Endoscopy;  Laterality: N/A;  . NASAL SINUS SURGERY    . PARTIAL COLECTOMY N/A 05/19/2018   Procedure: PARTIAL COLECTOMY;  Surgeon: Aviva Signs, MD;  Location: [facility] ORS;  Service: General;  Laterality: N/A;  . PERCUTANEOUS CORONARY STENT INTERVENTION (PCI-S)  12/02/2012   Procedure: PERCUTANEOUS CORONARY STENT INTERVENTION (PCI-S);  Surgeon: Jettie Booze, MD;  Location: [facility]  CATH LAB;  Service: Cardiovascular;;  DES to the MID LAD     Current Meds  Medication Sig  . aspirin EC 81 MG tablet Take 81 mg by mouth daily.   . isosorbide mononitrate (IMDUR) 60 MG 24 hr tablet TAKE 1 TABLET BY MOUTH DAILY  . latanoprost (XALATAN) 0.005 % ophthalmic solution Place 1 drop into both eyes at bedtime.   . metoprolol succinate (TOPROL-XL) 25 MG 24 hr tablet TAKE ONE (1) TABLET (25 MG TOTAL) BY MOUTH DAILY.   . nitroGLYCERIN (NITROSTAT) 0.4 MG SL tablet Place 1 tablet (0.4 mg total) under the tongue every 5 (five) minutes x 3 doses as needed for chest pain. May repeat x3  . pantoprazole (PROTONIX) 40 MG tablet TAKE 1 TABLET BY MOUTH DAILY  . rosuvastatin (CRESTOR) 20 MG tablet Take 1 tablet (20 mg total) by mouth daily.  . Wheat Dextrin (BENEFIBER PO) Take 1 Dose by mouth daily.  . [DISCONTINUED] brimonidine (ALPHAGAN) 0.15 % ophthalmic solution 1 drop 2 (two) times daily.  . [DISCONTINUED] nitroGLYCERIN (NITROSTAT) 0.4 MG SL tablet Place 1 tablet (0.4 mg total) under the tongue every 5 (five) minutes x 3 doses as needed for chest pain. May repeat x3     Allergies:   Crestor [rosuvastatin], Lipitor [atorvastatin], Lisinopril, and Simvastatin   ROS:   Reports chronic recurring sinus trouble and vertigo.  Prior CV studies:   The following studies were reviewed today:  Echocardiogram 09/02/2018: Study Conclusions  - Left ventricle: The cavity size was normal. Systolic function was normal. The estimated ejection fraction was in the range of 60% to 65%. Wall motion was normal; there were no regional wall motion abnormalities. Left ventricular diastolic function parameters were normal. - Aortic valve: Sclerosis without stenosis. - Atrial septum: No defect or patent foramen ovale was identified.  Lexiscan Myoview 11/05/2017:  No diagnostic ST segment changes to indicate ischemia.  Small, mild intensity, mid to apical septal defect that is fixed, more prominent  at rest and stress. Suggestive of soft tissue attenuation rather than scar in light of normal wall motion.  This is a low risk study.  Nuclear stress EF: 72%.  Carotid Dopplers 11/05/2018: IMPRESSION: Less than 50% stenosis in the right and left internal carotid arteries.  Event monitor 11/03/2018: 7-day event recorder reviewed. Sinus rhythm was present throughout. Heart rate ranged from 56 bpm up to 115 bpm with average heart rate 72 bpm. There were no significant arrhythmias or pauses.  Labs/Other Tests and Data Reviewed:    EKG:  An ECG dated 12/10/2019 was personally reviewed today and demonstrated:  Sinus rhythm with PAC, leftward axis, nonspecific ST-T changes.  Recent Labs: 05/30/2020: ALT 25; BUN 18; Creatinine, Ser 0.89; Hemoglobin 14.0; Platelets 257; Potassium 3.9; Sodium 141; TSH 1.583   Recent Lipid Panel Lab Results  Component Value Date/Time   CHOL 181 05/30/2020 12:32 PM   TRIG 115 05/30/2020 12:32 PM   TRIG 119 05/16/2010 12:00 AM   HDL 59 05/30/2020 12:32 PM  CHOLHDL 3.1 05/30/2020 12:32 PM   LDLCALC 99 05/30/2020 12:32 PM   LDLCALC 99 05/05/2019 11:24 AM   LDLDIRECT 144.4 11/09/2013 09:34 AM    Wt Readings from Last 3 Encounters:  07/08/20 140 lb (63.5 kg)  05/30/20 142 lb (64.4 kg)  01/07/20 142 lb (64.4 kg)     Objective:    Vital Signs:  BP 140/81   Pulse 88   Ht 5' 2.5" (1.588 m)   Wt 140 lb (63.5 kg)   BMI 25.20 kg/m    Patient spoke in full sentences, not short of breath on the phone.  ASSESSMENT & PLAN:    1.  CAD status post DES to the RCA and LAD in 2009 as well as DES to the LAD in 20,014.  She reports no active angina at this time, follow-up Myoview in 2019 was low risk.  Plan to continue regular exercise plan and medical therapy.  She is on aspirin, Imdur, Toprol-XL, Crestor, and resuming Norvasc at 2.5 mg daily.  2.  Mixed hyperlipidemia, recent LDL 99, agree with increase in Crestor to 20 mg daily.  3.  Essential hypertension,  resuming Norvasc at 2.5 mg daily.  Continue to track at home.  Time:   Today, I have spent 8 minutes with the patient with telehealth technology discussing the above problems.     Medication Adjustments/Labs and Tests Ordered: Current medicines are reviewed at length with the patient today.  Concerns regarding medicines are outlined above.   Tests Ordered: No orders of the defined types were placed in this encounter.   Medication Changes: Meds ordered this encounter  Medications  . amLODipine (NORVASC) 2.5 MG tablet    Sig: Take 1 tablet (2.5 mg total) by mouth daily.    Dispense:  90 tablet    Refill:  3  . nitroGLYCERIN (NITROSTAT) 0.4 MG SL tablet    Sig: Place 1 tablet (0.4 mg total) under the tongue every 5 (five) minutes x 3 doses as needed for chest pain. May repeat x3    Dispense:  25 tablet    Refill:  3    Follow Up:  In Person 6 months in the [place] office.  Signed, [name], MD  07/08/2020 1:03 PM    [place]

## 2020-07-08 NOTE — Telephone Encounter (Signed)
Patient has 1 pm apt today with Dr.McDowell. Will address issues then.

## 2020-07-08 NOTE — Telephone Encounter (Signed)
°  Patient Consent for Virtual Visit         Dorothy Lee has provided verbal consent on 07/08/2020 for a virtual visit (video or telephone).   CONSENT FOR VIRTUAL VISIT FOR:  Dorothy Lee  By participating in this virtual visit I agree to the following:  I hereby voluntarily request, consent and authorize [facility] and its employed or contracted physicians, physician assistants, nurse practitioners or other licensed health care professionals (the Practitioner), to provide me with telemedicine health care services (the Services") as deemed necessary by the treating Practitioner. I acknowledge and consent to receive the Services by the Practitioner via telemedicine. I understand that the telemedicine visit will involve communicating with the Practitioner through live audiovisual communication technology and the disclosure of certain medical information by electronic transmission. I acknowledge that I have been given the opportunity to request an in-person assessment or other available alternative prior to the telemedicine visit and am voluntarily participating in the telemedicine visit.  I understand that I have the right to withhold or withdraw my consent to the use of telemedicine in the course of my care at any time, without affecting my right to future care or treatment, and that the Practitioner or I may terminate the telemedicine visit at any time. I understand that I have the right to inspect all information obtained and/or recorded in the course of the telemedicine visit and may receive copies of available information for a reasonable fee.  I understand that some of the potential risks of receiving the Services via telemedicine include:   Delay or interruption in medical evaluation due to technological equipment failure or disruption;  Information transmitted may not be sufficient (e.g. poor resolution of images) to allow for appropriate medical decision making by the Practitioner;  and/or   In rare instances, security protocols could fail, causing a breach of personal health information.  Furthermore, I acknowledge that it is my responsibility to provide information about my medical history, conditions and care that is complete and accurate to the best of my ability. I acknowledge that Practitioner's advice, recommendations, and/or decision may be based on factors not within their control, such as incomplete or inaccurate data provided by me or distortions of diagnostic images or specimens that may result from electronic transmissions. I understand that the practice of medicine is not an exact science and that Practitioner makes no warranties or guarantees regarding treatment outcomes. I acknowledge that a copy of this consent can be made available to me via my patient portal ([place]), or I can request a printed copy by calling the office of [facility].    I understand that my insurance will be billed for this visit.   I have read or had this consent read to me.  I understand the contents of this consent, which adequately explains the benefits and risks of the Services being provided via telemedicine.   I have been provided ample opportunity to ask questions regarding this consent and the Services and have had my questions answered to my satisfaction.  I give my informed consent for the services to be provided through the use of telemedicine in my medical care

## 2020-07-08 NOTE — Patient Instructions (Signed)
Medication Instructions:  Re-Start Amlodipine 2.5 mg daily  I refilled your nitroglycerin   *If you need a refill on your cardiac medications before your next appointment, please call your pharmacy*   Lab Work: None today If you have labs (blood work) drawn today and your tests are completely normal, you will receive your results only by:  [place] (if you have MyChart) OR  A paper copy in the mail If you have any lab test that is abnormal or we need to change your treatment, we will call you to review the results.   Testing/Procedures: None today   Follow-Up: At [facility], you and your health needs are our priority.  As part of our continuing mission to provide you with exceptional heart care, we have created designated Provider Care Teams.  These Care Teams include your primary Cardiologist (physician) and Advanced Practice Providers (APPs -  Physician Assistants and Nurse Practitioners) who all work together to provide you with the care you need, when you need it.  We recommend signing up for the patient portal called "MyChart".  Sign up information is provided on this After Visit Summary.  MyChart is used to connect with patients for Virtual Visits (Telemedicine).  Patients are able to view lab/test results, encounter notes, upcoming appointments, etc.  Non-urgent messages can be sent to your provider as well.   To learn more about what you can do with MyChart, go to [email or web address].    Your next appointment:   6 month(s)  The format for your next appointment:   In Person  Provider:   [name], MD   Other Instructions None  Thank you for choosing [facility] !

## 2020-07-14 DIAGNOSIS — G450 Vertebro-basilar artery syndrome: Secondary | ICD-10-CM | POA: Diagnosis not present

## 2020-07-14 DIAGNOSIS — H8109 Meniere's disease, unspecified ear: Secondary | ICD-10-CM | POA: Diagnosis not present

## 2020-07-14 DIAGNOSIS — H903 Sensorineural hearing loss, bilateral: Secondary | ICD-10-CM | POA: Diagnosis not present

## 2020-07-14 DIAGNOSIS — R42 Dizziness and giddiness: Secondary | ICD-10-CM | POA: Diagnosis not present

## 2020-07-14 DIAGNOSIS — I951 Orthostatic hypotension: Secondary | ICD-10-CM | POA: Diagnosis not present

## 2020-07-20 ENCOUNTER — Ambulatory Visit: Payer: Medicare Other | Admitting: Student

## 2020-07-21 ENCOUNTER — Other Ambulatory Visit: Payer: Self-pay | Admitting: Cardiology

## 2020-07-21 DIAGNOSIS — I10 Essential (primary) hypertension: Secondary | ICD-10-CM

## 2020-07-25 DIAGNOSIS — Z23 Encounter for immunization: Secondary | ICD-10-CM | POA: Diagnosis not present

## 2020-08-04 DIAGNOSIS — R42 Dizziness and giddiness: Secondary | ICD-10-CM | POA: Diagnosis not present

## 2020-08-04 DIAGNOSIS — M62838 Other muscle spasm: Secondary | ICD-10-CM | POA: Diagnosis not present

## 2020-08-04 DIAGNOSIS — R519 Headache, unspecified: Secondary | ICD-10-CM | POA: Diagnosis not present

## 2020-08-05 ENCOUNTER — Other Ambulatory Visit: Payer: Self-pay | Admitting: Family Medicine

## 2020-08-10 ENCOUNTER — Other Ambulatory Visit: Payer: Self-pay | Admitting: Family Medicine

## 2020-08-10 DIAGNOSIS — R42 Dizziness and giddiness: Secondary | ICD-10-CM

## 2020-08-10 DIAGNOSIS — R519 Headache, unspecified: Secondary | ICD-10-CM

## 2020-08-23 ENCOUNTER — Ambulatory Visit (HOSPITAL_COMMUNITY)
Admission: RE | Admit: 2020-08-23 | Discharge: 2020-08-23 | Disposition: A | Discharge status: Home / Self Care | Payer: Medicare Other | Source: Ambulatory Visit | Attending: Family Medicine | Admitting: Family Medicine

## 2020-08-23 ENCOUNTER — Other Ambulatory Visit: Payer: Self-pay

## 2020-08-23 DIAGNOSIS — J3489 Other specified disorders of nose and nasal sinuses: Secondary | ICD-10-CM | POA: Diagnosis not present

## 2020-08-23 DIAGNOSIS — I739 Peripheral vascular disease, unspecified: Secondary | ICD-10-CM | POA: Diagnosis not present

## 2020-08-23 DIAGNOSIS — R519 Headache, unspecified: Secondary | ICD-10-CM | POA: Insufficient documentation

## 2020-08-23 DIAGNOSIS — R42 Dizziness and giddiness: Secondary | ICD-10-CM | POA: Insufficient documentation

## 2020-08-23 DIAGNOSIS — I6389 Other cerebral infarction: Secondary | ICD-10-CM | POA: Diagnosis not present

## 2020-08-23 MED ORDER — GADOBUTROL 1 MMOL/ML IV SOLN
7.0000 mL | Freq: Once | INTRAVENOUS | Status: AC | PRN
  Administered 2020-08-23: 7 mL via INTRAVENOUS

## 2020-10-04 DIAGNOSIS — H26492 Other secondary cataract, left eye: Secondary | ICD-10-CM | POA: Diagnosis not present

## 2020-10-04 DIAGNOSIS — H524 Presbyopia: Secondary | ICD-10-CM | POA: Diagnosis not present

## 2020-10-04 DIAGNOSIS — H401132 Primary open-angle glaucoma, bilateral, moderate stage: Secondary | ICD-10-CM | POA: Diagnosis not present

## 2020-10-06 DIAGNOSIS — M47816 Spondylosis without myelopathy or radiculopathy, lumbar region: Secondary | ICD-10-CM | POA: Diagnosis not present

## 2020-10-06 DIAGNOSIS — M4180 Other forms of scoliosis, site unspecified: Secondary | ICD-10-CM | POA: Diagnosis not present

## 2020-10-06 DIAGNOSIS — M5136 Other intervertebral disc degeneration, lumbar region: Secondary | ICD-10-CM | POA: Diagnosis not present

## 2020-10-25 ENCOUNTER — Other Ambulatory Visit: Payer: Self-pay | Admitting: Cardiology

## 2020-11-23 ENCOUNTER — Ambulatory Visit (INDEPENDENT_AMBULATORY_CARE_PROVIDER_SITE_OTHER): Payer: Medicare Other | Admitting: Cardiology

## 2020-11-23 ENCOUNTER — Other Ambulatory Visit: Payer: Self-pay

## 2020-11-23 ENCOUNTER — Encounter: Payer: Self-pay | Admitting: Cardiology

## 2020-11-23 VITALS — BP 140/82 | HR 70 | Ht 60.0 in | Wt 140.0 lb

## 2020-11-23 DIAGNOSIS — I25119 Atherosclerotic heart disease of native coronary artery with unspecified angina pectoris: Secondary | ICD-10-CM

## 2020-11-23 DIAGNOSIS — E782 Mixed hyperlipidemia: Secondary | ICD-10-CM

## 2020-11-23 NOTE — Progress Notes (Signed)
Cardiology Office Note  Date: 11/23/2020   ID: Dorothy Lee, DOB 09-06-1941, MRN [id number]  PCP:  [name], MD  Cardiologist:  [name], MD Electrophysiologist:  None   Chief Complaint  Patient presents with  . Cardiac follow-up    History of Present Illness: Dorothy Lee is an 80 y.o. female last assessed via telehealth encounter October 2021.  She presents for a follow-up visit.  Reports an episode of intense angina while she was at a wedding down in [place] recently, took nitroglycerin, ultimately symptoms resolved.  She has had a few of these types of episodes since I saw her.  Reports compliance with her medications otherwise.  No episodes of dizziness to the point of near syncope or syncope however.  She does state that blood pressure tends to fluctuate somewhat.  Last ischemic evaluation was via a [place] in January 2019 as noted below.  Past Medical History:  Diagnosis Date  . Allergic rhinitis   . CAD (coronary artery disease)    DES RCA and LAD 02/2008, DES mid LAD 11/2012  . Diverticulitis   . Diverticulosis   . Essential hypertension   . GERD (gastroesophageal reflux disease)   . Glaucoma   . History of breast cancer    Right - s/p XRT 1984, surgery, no chemo  . Hyperlipidemia   . Personal history of radiation therapy   . Rotator cuff syndrome, left   . Scoliosis   . Urinary incontinence     Past Surgical History:  Procedure Laterality Date  . APPENDECTOMY  1981  . BREAST BIOPSY  1984  . BREAST LUMPECTOMY  1982   right breast  . COLONOSCOPY  2008   Dr. Oletta Lamas: diverticulosis, hemorrhoids, conscious sedation  . COLONOSCOPY N/A 01/30/2016   Dr. Gala Romney: mild divertiuculosis in sigmoid colon. narrowing of the colon in association with the deverticular opening. 6 polyps removed, tubular adenoma. next TCS in 3 years  . CORONARY ANGIOPLASTY WITH STENT PLACEMENT  02/2008  . ESOPHAGOGASTRODUODENOSCOPY  2014   Dr. Fuller Plan: erosive gastritis,  no h.pylori  . ESOPHAGOGASTRODUODENOSCOPY N/A 01/30/2016   Dr. Gala Romney: normal esophagus s/p dilation, small hh.  Marland Kitchen LEFT HEART CATHETERIZATION WITH CORONARY ANGIOGRAM N/A 12/02/2012   Procedure: LEFT HEART CATHETERIZATION WITH CORONARY ANGIOGRAM;  Surgeon: Jettie Booze, MD;  Location[facility] CATH LAB;  Service: Cardiovascular;  Laterality: N/A;  . Venia Minks DILATION N/A 01/30/2016   Procedure: Venia Minks DILATION;  Surgeon: Daneil Dolin, MD;  Location: [facility] ENDO SUITE;  Service: Endoscopy;  Laterality: N/A;  . NASAL SINUS SURGERY    . PARTIAL COLECTOMY N/A 05/19/2018   Procedure: PARTIAL COLECTOMY;  Surgeon: Aviva Signs, MD;  Location: [facility] ORS;  Service: General;  Laterality: N/A;  . PERCUTANEOUS CORONARY STENT INTERVENTION (PCI-S)  12/02/2012   Procedure: PERCUTANEOUS CORONARY STENT INTERVENTION (PCI-S);  Surgeon: Jettie Booze, MD;  Location: [facility] CATH LAB;  Service: Cardiovascular;;  DES to the MID LAD    Current Outpatient Medications  Medication Sig Dispense Refill  . amLODipine (NORVASC) 2.5 MG tablet Take 1 tablet (2.5 mg total) by mouth daily. 90 tablet 3  . aspirin EC 81 MG tablet Take 81 mg by mouth daily.     . isosorbide mononitrate (IMDUR) 60 MG 24 hr tablet TAKE 1 TABLET BY MOUTH DAILY 90 tablet 3  . latanoprost (XALATAN) 0.005 % ophthalmic solution Place 1 drop into both eyes at bedtime.     . meloxicam (MOBIC) 15 MG tablet 1 tablet    .  metoprolol succinate (TOPROL-XL) 25 MG 24 hr tablet TAKE 1 TABLET BY MOUTH DAILY 90 tablet 2  . montelukast (SINGULAIR) 10 MG tablet 1 tablet    . nitroGLYCERIN (NITROSTAT) 0.4 MG SL tablet Place 1 tablet (0.4 mg total) under the tongue every 5 (five) minutes x 3 doses as needed for chest pain. May repeat x3 25 tablet 3  . pantoprazole (PROTONIX) 40 MG tablet TAKE 1 TABLET BY MOUTH DAILY 90 tablet 3  . rosuvastatin (CRESTOR) 20 MG tablet Take 1 tablet (20 mg total) by mouth daily. 90 tablet 3  . Wheat Dextrin (BENEFIBER PO) Take 1 Dose by mouth  daily.     No current facility-administered medications for this visit.   Allergies:  Crestor [rosuvastatin], Lipitor [atorvastatin], Lisinopril, and Simvastatin   ROS: No palpitations.  No frank syncope.  Physical Exam: VS:  BP 140/82   Pulse 70   Ht 5' (1.524 m)   Wt 140 lb (63.5 kg)   SpO2 97%   BMI 27.34 kg/m , BMI Body mass index is 27.34 kg/m.  Wt Readings from Last 3 Encounters:  11/23/20 140 lb (63.5 kg)  07/08/20 140 lb (63.5 kg)  05/30/20 142 lb (64.4 kg)    General: Elderly woman, appears comfortable at rest. HEENT: Conjunctiva and lids normal, wearing a mask. Neck: Supple, no elevated JVP or carotid bruits, no thyromegaly. Lungs: Clear to auscultation, nonlabored breathing at rest. Cardiac: Regular rate and rhythm, no S3 or significant systolic murmur, no pericardial rub. Extremities: No pitting edema.  ECG:  An ECG dated 12/10/2019 was personally reviewed today and demonstrated:  Sinus rhythm with PAC, leftward axis, nonspecific ST-T changes.  Recent Labwork: 05/30/2020: ALT 25; AST 26; BUN 18; Creatinine, Ser 0.89; Hemoglobin 14.0; Platelets 257; Potassium 3.9; Sodium 141; TSH 1.583     Component Value Date/Time   CHOL 181 05/30/2020 1232   TRIG 115 05/30/2020 1232   TRIG 119 05/16/2010 0000   HDL 59 05/30/2020 1232   CHOLHDL 3.1 05/30/2020 1232   VLDL 23 05/30/2020 1232   LDLCALC 99 05/30/2020 1232   LDLCALC 99 05/05/2019 1124   LDLDIRECT 144.4 11/09/2013 0934    Other Studies Reviewed Today:  Echocardiogram 09/02/2018: Study Conclusions  - Left ventricle: The cavity size was normal. Systolic function was normal. The estimated ejection fraction was in the range of 60% to 65%. Wall motion was normal; there were no regional wall motion abnormalities. Left ventricular diastolic function parameters were normal. - Aortic valve: Sclerosis without stenosis. - Atrial septum: No defect or patent foramen ovale was identified.  Lexiscan Myoview  11/05/2017:  No diagnostic ST segment changes to indicate ischemia.  Small, mild intensity, mid to apical septal defect that is fixed, more prominent at rest and stress. Suggestive of soft tissue attenuation rather than scar in light of normal wall motion.  This is a low risk study.  Nuclear stress EF: 72%.  Carotid Dopplers 11/05/2018: IMPRESSION: Less than 50% stenosis in the right and left internal carotid arteries.  Event monitor 11/03/2018: 7-day event recorder reviewed. Sinus rhythm was present throughout. Heart rate ranged from 56 bpm up to 115 bpm with average heart rate 72 bpm. There were no significant arrhythmias or pauses.  Assessment and Plan:  1.  He with history of DES to the RCA and LAD in 2009 as well as DES to the LAD in 2014.  She has had intermittent angina symptoms as discussed above on stable medical therapy.  We will follow up with  a [employer] for reassessment of ischemic burden.  For now continue current regimen including aspirin, Norvasc, Imdur, Toprol-XL, Crestor, and as needed nitroglycerin.  2.  Mixed hyperlipidemia, on Crestor.  Medication Adjustments/Labs and Tests Ordered: Current medicines are reviewed at length with the patient today.  Concerns regarding medicines are outlined above.   Tests Ordered: Orders Placed This Encounter  Procedures  . NM Myocar Multi W/Spect W/Wall Motion / EF    Medication Changes: No orders of the defined types were placed in this encounter.   Disposition:  Follow up test results.  Signed, Satira Sark, MD, [facility] 11/23/2020 4:28 PM    [facility] at [facility] [address], [place], [place] [address] Phone: [phone]; Fax: [phone]

## 2020-11-23 NOTE — Patient Instructions (Signed)
Medication Instructions:  Your physician recommends that you continue on your current medications as directed. Please refer to the Current Medication list given to you today.  *If you need a refill on your cardiac medications before your next appointment, please call your pharmacy*   Lab Work: None today If you have labs (blood work) drawn today and your tests are completely normal, you will receive your results only by: [facility] MyChart Message (if you have MyChart) OR . A paper copy in the mail If you have any lab test that is abnormal or we need to change your treatment, we will call you to review the results.   Testing/Procedures: Your physician has requested that you have a lexiscan myoview. For further information please visit [email or web address]. Please follow instruction sheet, as given.    Follow-Up: At [facility], you and your health needs are our priority.  As part of our continuing mission to provide you with exceptional heart care, we have created designated Provider Care Teams.  These Care Teams include your primary Cardiologist (physician) and Advanced Practice Providers (APPs -  Physician Assistants and Nurse Practitioners) who all work together to provide you with the care you need, when you need it.  We recommend signing up for the patient portal called "MyChart".  Sign up information is provided on this After Visit Summary.  MyChart is used to connect with patients for Virtual Visits (Telemedicine).  Patients are able to view lab/test results, encounter notes, upcoming appointments, etc.  Non-urgent messages can be sent to your provider as well.   To learn more about what you can do with MyChart, go to [email or web address].    Your next appointment:   6 month(s)  The format for your next appointment:   In Person  Provider:   [name], MD   Other Instructions None       Thank you for choosing [facility] !

## 2020-11-30 ENCOUNTER — Encounter (HOSPITAL_COMMUNITY): Payer: Medicare Other

## 2020-11-30 ENCOUNTER — Ambulatory Visit (HOSPITAL_COMMUNITY): Payer: Medicare Other

## 2020-12-09 ENCOUNTER — Ambulatory Visit (HOSPITAL_COMMUNITY)
Admission: RE | Admit: 2020-12-09 | Discharge: 2020-12-09 | Disposition: A | Discharge status: Home / Self Care | Payer: Medicare Other | Source: Ambulatory Visit | Attending: Cardiology | Admitting: Cardiology

## 2020-12-09 ENCOUNTER — Ambulatory Visit (HOSPITAL_BASED_OUTPATIENT_CLINIC_OR_DEPARTMENT_OTHER)
Admission: RE | Admit: 2020-12-09 | Discharge: 2020-12-09 | Disposition: A | Discharge status: Home / Self Care | Payer: Medicare Other | Source: Ambulatory Visit | Attending: Cardiology | Admitting: Cardiology

## 2020-12-09 ENCOUNTER — Telehealth: Payer: Self-pay

## 2020-12-09 ENCOUNTER — Other Ambulatory Visit: Payer: Self-pay

## 2020-12-09 ENCOUNTER — Encounter (HOSPITAL_COMMUNITY): Payer: Self-pay

## 2020-12-09 DIAGNOSIS — I25119 Atherosclerotic heart disease of native coronary artery with unspecified angina pectoris: Secondary | ICD-10-CM | POA: Insufficient documentation

## 2020-12-09 LAB — NM MYOCAR MULTI W/SPECT W/WALL MOTION / EF
LV dias vol: 40 mL (ref 46–106)
LV sys vol: 6 mL
Peak HR: 96 {beats}/min
RATE: 0.28
Rest HR: 65 {beats}/min
SDS: 3
SRS: 3
SSS: 6
TID: 1.09

## 2020-12-09 MED ORDER — REGADENOSON 0.4 MG/5ML IV SOLN
INTRAVENOUS | Status: AC
  Administered 2020-12-09: 0.4 mg via INTRAVENOUS
  Filled 2020-12-09: qty 5

## 2020-12-09 MED ORDER — AMLODIPINE BESYLATE 5 MG PO TABS: 5.0000 mg | ORAL_TABLET | Freq: Every day | ORAL | 3 refills | Status: DC

## 2020-12-09 MED ORDER — TECHNETIUM TC 99M TETROFOSMIN IV KIT
30.0000 | PACK | Freq: Once | INTRAVENOUS | Status: AC | PRN
  Administered 2020-12-09: 28.6 via INTRAVENOUS

## 2020-12-09 MED ORDER — SODIUM CHLORIDE FLUSH 0.9 % IV SOLN
INTRAVENOUS | Status: AC
  Administered 2020-12-09: 10 mL via INTRAVENOUS
  Filled 2020-12-09: qty 10

## 2020-12-09 MED ORDER — TECHNETIUM TC 99M TETROFOSMIN IV KIT
10.0000 | PACK | Freq: Once | INTRAVENOUS | Status: AC | PRN
  Administered 2020-12-09: 9.8 via INTRAVENOUS

## 2020-12-09 NOTE — Telephone Encounter (Signed)
Results given to patient, e-scribed amlodipine 5 mg qd to [facility]

## 2020-12-09 NOTE — Telephone Encounter (Signed)
-----   Message from Satira Sark, MD sent at 12/09/2020  3:20 PM EST ----- Results reviewed.  Stress test was overall low risk, mild ischemic territory in the mid to basal inferoseptal wall.  Since symptoms have resolved, would continue medical therapy, increase Norvasc to 5 mg daily for now.  Schedule office visit for 6 months, sooner if symptoms continue to recur.

## 2020-12-26 ENCOUNTER — Other Ambulatory Visit: Payer: Self-pay | Admitting: Cardiology

## 2021-01-11 ENCOUNTER — Other Ambulatory Visit (HOSPITAL_COMMUNITY): Payer: Self-pay | Admitting: Family Medicine

## 2021-01-11 DIAGNOSIS — Z780 Asymptomatic menopausal state: Secondary | ICD-10-CM

## 2021-01-11 DIAGNOSIS — Z1231 Encounter for screening mammogram for malignant neoplasm of breast: Secondary | ICD-10-CM

## 2021-01-13 ENCOUNTER — Other Ambulatory Visit (HOSPITAL_COMMUNITY): Payer: Self-pay | Admitting: Physician Assistant

## 2021-01-13 DIAGNOSIS — N644 Mastodynia: Secondary | ICD-10-CM

## 2021-01-24 ENCOUNTER — Other Ambulatory Visit (HOSPITAL_COMMUNITY): Payer: Self-pay | Admitting: Family Medicine

## 2021-01-25 ENCOUNTER — Other Ambulatory Visit (HOSPITAL_COMMUNITY): Payer: Self-pay | Admitting: Family Medicine

## 2021-01-25 ENCOUNTER — Inpatient Hospital Stay
Admission: RE | Admit: 2021-01-25 | Discharge: 2021-01-25 | Disposition: A | Discharge status: Home / Self Care | Payer: Self-pay | Source: Ambulatory Visit | Attending: Family Medicine | Admitting: Family Medicine

## 2021-01-25 DIAGNOSIS — Z1231 Encounter for screening mammogram for malignant neoplasm of breast: Secondary | ICD-10-CM

## 2021-02-07 ENCOUNTER — Other Ambulatory Visit: Payer: Self-pay

## 2021-02-07 ENCOUNTER — Ambulatory Visit (INDEPENDENT_AMBULATORY_CARE_PROVIDER_SITE_OTHER): Payer: Medicare Other | Admitting: Gastroenterology

## 2021-02-07 ENCOUNTER — Telehealth: Payer: Self-pay | Admitting: *Deleted

## 2021-02-07 ENCOUNTER — Encounter: Payer: Self-pay | Admitting: Gastroenterology

## 2021-02-07 VITALS — BP 143/69 | HR 70 | Temp 96.9°F | Ht 62.0 in | Wt 139.0 lb

## 2021-02-07 DIAGNOSIS — K219 Gastro-esophageal reflux disease without esophagitis: Secondary | ICD-10-CM | POA: Diagnosis not present

## 2021-02-07 DIAGNOSIS — Z86010 Personal history of colonic polyps: Secondary | ICD-10-CM | POA: Diagnosis not present

## 2021-02-07 DIAGNOSIS — R1310 Dysphagia, unspecified: Secondary | ICD-10-CM

## 2021-02-07 DIAGNOSIS — I25119 Atherosclerotic heart disease of native coronary artery with unspecified angina pectoris: Secondary | ICD-10-CM | POA: Diagnosis not present

## 2021-02-07 NOTE — Telephone Encounter (Signed)
Awaiting Dr. Roseanne Kaufman procedure schedule for July to call and schedule for procedures.

## 2021-02-07 NOTE — Patient Instructions (Signed)
We are arranging a colonoscopy, upper endoscopy, and dilation in the near future.  We will see you back in 6 months!  It was great to see you again!  I enjoyed seeing you again today! As you know, I value our relationship and want to provide genuine, compassionate, and quality care. I welcome your feedback. If you receive a survey regarding your visit,  I greatly appreciate you taking time to fill this out. See you next time!  Annitta Needs, PhD, ANP-BC [facility]

## 2021-02-07 NOTE — Progress Notes (Signed)
Referring Provider: [name], MD Primary Care Physician:  [name], MD Primary GI: Dr. Gala Romney   Chief Complaint  Patient presents with  . Abdominal Pain    Occ, mid upper abd and lower abd    HPI:   Dorothy Lee is an 80 y.o. female presenting today with a history constipation, GERD  perforated diverticulitis Jan 2019, undergoing elective colectomy in Aug 2019. She is actually due for surveillance colonoscopy with history of multiple polyps.   Has chronic intermittent upper abdominal pain that is not new. Sometimes if laying up against something like a counter will have mild tenderness lower abdomen. Taking Benefiber 2 teaspoons daily, which helps to keep her regular. Protonix once per day. Gets choked easily. Last EGD in 2017 with empiric dilation and improvement after that.   Past Medical History:  Diagnosis Date  . Allergic rhinitis   . CAD (coronary artery disease)    DES RCA and LAD 02/2008, DES mid LAD 11/2012  . Cancer ([place])   . Diverticulitis   . Diverticulosis   . Essential hypertension   . GERD (gastroesophageal reflux disease)   . Glaucoma   . History of breast cancer    Right - s/p XRT 1984, surgery, no chemo  . Hyperlipidemia   . Personal history of radiation therapy   . Rotator cuff syndrome, left   . Scoliosis   . Urinary incontinence     Past Surgical History:  Procedure Laterality Date  . APPENDECTOMY  1981  . BREAST BIOPSY  1984  . BREAST LUMPECTOMY  1982   right breast  . COLONOSCOPY  2008   Dr. Oletta Lamas: diverticulosis, hemorrhoids, conscious sedation  . COLONOSCOPY N/A 01/30/2016   Dr. Gala Romney: mild divertiuculosis in sigmoid colon. narrowing of the colon in association with the deverticular opening. 6 polyps removed, tubular adenoma. next TCS in 3 years  . CORONARY ANGIOPLASTY WITH STENT PLACEMENT  02/2008  . ESOPHAGOGASTRODUODENOSCOPY  2014   Dr. Fuller Plan: erosive gastritis, no h.pylori  . ESOPHAGOGASTRODUODENOSCOPY N/A 01/30/2016   Dr.  Gala Romney: normal esophagus s/p dilation, small hh.  Marland Kitchen LEFT HEART CATHETERIZATION WITH CORONARY ANGIOGRAM N/A 12/02/2012   Procedure: LEFT HEART CATHETERIZATION WITH CORONARY ANGIOGRAM;  Surgeon: Jettie Booze, MD;  Location[facility] CATH LAB;  Service: Cardiovascular;  Laterality: N/A;  . Venia Minks DILATION N/A 01/30/2016   Procedure: Venia Minks DILATION;  Surgeon: Daneil Dolin, MD;  Location: [facility] ENDO SUITE;  Service: Endoscopy;  Laterality: N/A;  . NASAL SINUS SURGERY    . PARTIAL COLECTOMY N/A 05/19/2018   Procedure: PARTIAL COLECTOMY;  Surgeon: Aviva Signs, MD;  Location: [facility] ORS;  Service: General;  Laterality: N/A;  . PERCUTANEOUS CORONARY STENT INTERVENTION (PCI-S)  12/02/2012   Procedure: PERCUTANEOUS CORONARY STENT INTERVENTION (PCI-S);  Surgeon: Jettie Booze, MD;  Location: [facility] CATH LAB;  Service: Cardiovascular;;  DES to the MID LAD    Current Outpatient Medications  Medication Sig Dispense Refill  . amLODipine (NORVASC) 5 MG tablet Take 1 tablet (5 mg total) by mouth daily. 90 tablet 3  . aspirin EC 81 MG tablet Take 81 mg by mouth daily.     . isosorbide mononitrate (IMDUR) 60 MG 24 hr tablet TAKE 1 TABLET BY MOUTH DAILY 90 tablet 3  . latanoprost (XALATAN) 0.005 % ophthalmic solution Place 1 drop into both eyes at bedtime.     . metoprolol succinate (TOPROL-XL) 25 MG 24 hr tablet TAKE 1 TABLET BY MOUTH DAILY 90 tablet 2  .  montelukast (SINGULAIR) 10 MG tablet Take 10 mg by mouth as needed.    . nitroGLYCERIN (NITROSTAT) 0.4 MG SL tablet Place 1 tablet (0.4 mg total) under the tongue every 5 (five) minutes x 3 doses as needed for chest pain. May repeat x3 25 tablet 3  . pantoprazole (PROTONIX) 40 MG tablet TAKE 1 TABLET BY MOUTH DAILY 90 tablet 3  . rosuvastatin (CRESTOR) 20 MG tablet Take 1 tablet (20 mg total) by mouth daily. 90 tablet 3  . Wheat Dextrin (BENEFIBER PO) Take 1 Dose by mouth daily.     No current facility-administered medications for this visit.    Allergies as  of 02/07/2021 - Review Complete 02/07/2021  Allergen Reaction Noted  . Crestor [rosuvastatin]  08/11/2013  . Lipitor [atorvastatin]  08/11/2013  . Lisinopril Cough 02/16/2013  . Simvastatin  08/11/2013    Family History  Problem Relation Age of Onset  . Emphysema Father   . Emphysema Brother   . Heart disease Brother        x3 brothers  . Lung cancer Brother   . Throat cancer Brother        head/neck cancer  . Heart attack Brother   . Heart attack Sister   . Heart attack Son        blood clot  . Colon cancer Neg Hx     Social History   Socioeconomic History  . Marital status: Married    Spouse name: Not on file  . Number of children: 1  . Years of education: Not on file  . Highest education level: Not on file  Occupational History  . Occupation: Probation officer  . Occupation: COSMETOLOGY    Employer: [employer]  Tobacco Use  . Smoking status: Never Smoker  . Smokeless tobacco: Never Used  . Tobacco comment: Widowed, lives alone but has sig other.   Vaping Use  . Vaping Use: Never used  Substance and Sexual Activity  . Alcohol use: Not Currently  . Drug use: No  . Sexual activity: Yes    Birth control/protection: Post-menopausal  Other Topics Concern  . Not on file  Social History Narrative   Widowed, husband died.    Lives alone but has significant other, married in 02/07/16 to [place].    Had one son, but he passed away.    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 Has two grandsons.       Social Determinants of Health   Financial Resource Strain: Not on file  Food Insecurity: Not on file  Transportation Needs: Not on file  Physical Activity: Not on file  Stress: Not on file  Social Connections: Not on file    Review of Systems: Gen: Denies fever, chills, anorexia. Denies fatigue, weakness, weight loss.  CV: Denies chest pain, palpitations, syncope, peripheral edema, and  claudication. Resp: Denies dyspnea at rest, cough, wheezing, coughing up blood, and pleurisy. GI: see HPI Derm: Denies rash, itching, dry skin Psych: Denies depression, anxiety, memory loss, confusion. No homicidal or suicidal ideation.  Heme: Denies bruising, bleeding, and enlarged lymph nodes.  Physical Exam: BP (!) 143/69   Pulse 70   Temp (!) 96.9 F (36.1 C) (Temporal)   Ht 5\' 2"  (1.575 m)   Wt 139 lb (63 kg)   BMI 25.42 kg/m  General:   Alert and oriented. No distress noted. Pleasant and cooperative.  Head:  Normocephalic and atraumatic. Eyes:  Conjuctiva clear without scleral icterus. Mouth:  Mask in place Cardiac: S1 S2 present without murmurs Lungs: clear bilaterally Abdomen:  +BS, soft, non-tender and non-distended. No rebound or guarding. No HSM or masses noted. Msk:  Symmetrical without gross deformities. Normal posture. Extremities:  Without edema. Neurologic:  Alert and  oriented x4 Psych:  Alert and cooperative. Normal mood and affect.  ASSESSMENT: Dorothy Lee is a delightful 80 y.o. female presenting today with a history constipation, GERD  perforated diverticulitis Jan 2019, undergoing elective partial colectomy in Aug 2019. She is actually due for surveillance colonoscopy with history of multiple polyps.   Notes intermittent solid food and liquid dysphagia; she did note improvement with dysphagia in 2017 after empiric dilation. We discussed pursuing EGD/dilation in the near future.  She also desires surveillance colonoscopy at this time. Although she is 86, she is in good health, active, and would tolerate this well.   PLAN:  Proceed with colonoscopy/EGD/dilation by Dr. Gala Romney in near future: the risks, benefits, and alternatives have been discussed with the patient in detail. The patient states understanding and desires to proceed.  Continue Protonix daily  Continue Benefiber daily  Return 6 months  Annitta Needs, PhD, [facility]  Gastroenterology

## 2021-02-14 ENCOUNTER — Encounter (HOSPITAL_COMMUNITY): Payer: Medicare Other

## 2021-02-14 ENCOUNTER — Other Ambulatory Visit (HOSPITAL_COMMUNITY): Payer: Medicare Other

## 2021-03-09 MED ORDER — NA SULFATE-K SULFATE-MG SULF 17.5-3.13-1.6 GM/177ML PO SOLN: 1.0000 | Freq: Once | ORAL | 0 refills | Status: AC

## 2021-03-09 NOTE — Telephone Encounter (Signed)
Called pt. She has been scheduled for tcs/egd/dil with propofol, ASA 3, Dr. Gala Romney on 7/11 at 11:00am. She is aware will mail prep instructions to her with pre-op appt. Confirmed address. Confirmed pharmacy.

## 2021-03-09 NOTE — Addendum Note (Signed)
Addended by: Cheron Every on: 03/09/2021 03:39 PM   Modules accepted: Orders

## 2021-03-13 ENCOUNTER — Encounter: Payer: Self-pay | Admitting: *Deleted

## 2021-03-27 ENCOUNTER — Ambulatory Visit (HOSPITAL_COMMUNITY): Payer: Medicare Other

## 2021-03-27 ENCOUNTER — Other Ambulatory Visit (HOSPITAL_COMMUNITY): Payer: Medicare Other

## 2021-04-06 ENCOUNTER — Telehealth: Payer: Self-pay | Admitting: Internal Medicine

## 2021-04-06 NOTE — Telephone Encounter (Signed)
Tried to call pt, LMOVM for return call. Procedure is scheduled for 04/17/21.

## 2021-04-06 NOTE — Telephone Encounter (Signed)
Patient called and said that she has been having sinus trouble and congestion, wanted to know if it was ok for her to do her procedures

## 2021-04-12 NOTE — Patient Instructions (Signed)
[place]  04/12/2021     @PREFPERIOPPHARMACY @   Your procedure is scheduled on  04/17/2021.   Report to [facility] at  [place].   Call this number if you have problems the morning of surgery:  [phone]   Remember:  Follow the diet and prep instructions given to you by the office.    Take these medicines the morning of surgery with A SIP OF WATER      amlodipine, isosorbide, mobic (if needed), metoprolol, singulair, protonix.     Do not wear jewelry, make-up or nail polish.  Do not wear lotions, powders, or perfumes, or deodorant.  Do not shave 48 hours prior to surgery.  Men may shave face and neck.  Do not bring valuables to the hospital.  [facility] is not responsible for any belongings or valuables.  Contacts, dentures or bridgework may not be worn into surgery.  Leave your suitcase in the car.  After surgery it may be brought to your room.  For patients admitted to the hospital, discharge time will be determined by your treatment team.  Patients discharged the day of surgery will not be allowed to drive home and must have someone with them for 24 hours.    Special instructions:   DO NOT smoke tobacco or vape for 24 hours before your procedure.  Please read over the following fact sheets that you were given. Anesthesia Post-op Instructions and Care and Recovery After Surgery      Upper Endoscopy, Adult, Care After This sheet gives you information about how to care for yourself after your procedure. Your health care provider may also give you more specific instructions. If you have problems or questions, contact your health careprovider. What can I expect after the procedure? After the procedure, it is common to have: A sore throat. Mild stomach pain or discomfort. Bloating. Nausea. Follow these instructions at home:  Follow instructions from your health care provider about what to eat or drink after your procedure. Return to your  normal activities as told by your health care provider. Ask your health care provider what activities are safe for you. Take over-the-counter and prescription medicines only as told by your health care provider. If you were given a sedative during the procedure, it can affect you for several hours. Do not drive or operate machinery until your health care provider says that it is safe. Keep all follow-up visits as told by your health care provider. This is important. Contact a health care provider if you have: A sore throat that lasts longer than one day. Trouble swallowing. Get help right away if: You vomit blood or your vomit looks like coffee grounds. You have: A fever. Bloody, black, or tarry stools. A severe sore throat or you cannot swallow. Difficulty breathing. Severe pain in your chest or abdomen. Summary After the procedure, it is common to have a sore throat, mild stomach discomfort, bloating, and nausea. If you were given a sedative during the procedure, it can affect you for several hours. Do not drive or operate machinery until your health care provider says that it is safe. Follow instructions from your health care provider about what to eat or drink after your procedure. Return to your normal activities as told by your health care provider. This information is not intended to replace advice given to you by your health care provider. Make sure you discuss any questions you have  with your healthcare provider. Document Revised: 09/22/2019 Document Reviewed: 02/24/2018 Elsevier Patient Education  2022 [place]. https://www.asge.org/home/for-patients/patient-information/understanding-eso-dilation-updated">  Esophageal Dilatation Esophageal dilatation, also called esophageal dilation, is a procedure to widen or open a blocked or narrowed part of the esophagus. The esophagus is the part of the body that moves food and liquid from the mouth to the stomach. You may need this  procedure if: You have a buildup of scar tissue in your esophagus that makes it difficult, painful, or impossible to swallow. This can be caused by gastroesophageal reflux disease (GERD). You have cancer of the esophagus. There is a problem with how food moves through your esophagus. In some cases, you may need this procedure repeated at a later time to dilatethe esophagus gradually. Tell a health care provider about: Any allergies you have. All medicines you are taking, including vitamins, herbs, eye drops, creams, and over-the-counter medicines. Any problems you or family members have had with anesthetic medicines. Any blood disorders you have. Any surgeries you have had. Any medical conditions you have. Any antibiotic medicines you are required to take before dental procedures. Whether you are pregnant or may be pregnant. What are the risks? Generally, this is a safe procedure. However, problems may occur, including: Bleeding due to a tear in the lining of the esophagus. A hole, or perforation, in the esophagus. What happens before the procedure? Ask your health care provider about: Changing or stopping your regular medicines. This is especially important if you are taking diabetes medicines or blood thinners. Taking medicines such as aspirin and ibuprofen. These medicines can thin your blood. Do not take these medicines unless your health care provider tells you to take them. Taking over-the-counter medicines, vitamins, herbs, and supplements. Follow instructions from your health care provider about eating or drinking restrictions. Plan to have a responsible adult take you home from the hospital or clinic. Plan to have a responsible adult care for you for the time you are told after you leave the hospital or clinic. This is important. What happens during the procedure? You may be given a medicine to help you relax (sedative). A numbing medicine may be sprayed into the back of your  throat, or you may gargle the medicine. Your health care provider may perform the dilatation using various surgical instruments, such as: Simple dilators. This instrument is carefully placed in the esophagus to stretch it. Guided wire bougies. This involves using an endoscope to insert a wire into the esophagus. A dilator is passed over this wire to enlarge the esophagus. Then the wire is removed. Balloon dilators. An endoscope with a small balloon is inserted into the esophagus. The balloon is inflated to stretch the esophagus and open it up. The procedure may vary among health care providers and hospitals. What can I expect after the procedure? Your blood pressure, heart rate, breathing rate, and blood oxygen level will be monitored until you leave the hospital or clinic. Your throat may feel slightly sore and numb. This will get better over time. You will not be allowed to eat or drink until your throat is no longer numb. When you are able to drink, urinate, and sit on the edge of the bed without nausea or dizziness, you may be able to return home. Follow these instructions at home: Take over-the-counter and prescription medicines only as told by your health care provider. If you were given a sedative during the procedure, it can affect you for several hours. Do not drive or operate machinery  until your health care provider says that it is safe. Plan to have a responsible adult care for you for the time you are told. This is important. Follow instructions from your health care provider about any eating or drinking restrictions. Do not use any products that contain nicotine or tobacco, such as cigarettes, e-cigarettes, and chewing tobacco. If you need help quitting, ask your health care provider. Keep all follow-up visits. This is important. Contact a health care provider if: You have a fever. You have pain that is not relieved by medicine. Get help right away if: You have chest pain. You  have trouble breathing. You have trouble swallowing. You vomit blood. You have black, tarry, or bloody stools. These symptoms may represent a serious problem that is an emergency. Do not wait to see if the symptoms will go away. Get medical help right away. Call your local emergency services (911 in the [place] Do not drive yourself to the hospital. Summary Esophageal dilatation, also called esophageal dilation, is a procedure to widen or open a blocked or narrowed part of the esophagus. Plan to have a responsible adult take you home from the hospital or clinic. For this procedure, a numbing medicine may be sprayed into the back of your throat, or you may gargle the medicine. Do not drive or operate machinery until your health care provider says that it is safe. This information is not intended to replace advice given to you by your health care provider. Make sure you discuss any questions you have with your healthcare provider. Document Revised: 02/10/2020 Document Reviewed: 02/10/2020 Elsevier Patient Education  [place]. Colonoscopy, Adult, Care After This sheet gives you information about how to care for yourself after your procedure. Your health care provider may also give you more specific instructions. If you have problems or questions, contact your health careprovider. What can I expect after the procedure? After the procedure, it is common to have: A small amount of blood in your stool for 24 hours after the procedure. Some gas. Mild cramping or bloating of your abdomen. Follow these instructions at home: Eating and drinking  Drink enough fluid to keep your urine pale yellow. Follow instructions from your health care provider about eating or drinking restrictions. Resume your normal diet as instructed by your health care provider. Avoid heavy or fried foods that are hard to digest.  Activity Rest as told by your health care provider. Avoid sitting for a long time  without moving. Get up to take short walks every 1-2 hours. This is important to improve blood flow and breathing. Ask for help if you feel weak or unsteady. Return to your normal activities as told by your health care provider. Ask your health care provider what activities are safe for you. Managing cramping and bloating  Try walking around when you have cramps or feel bloated. Apply heat to your abdomen as told by your health care provider. Use the heat source that your health care provider recommends, such as a moist heat pack or a heating pad. Place a towel between your skin and the heat source. Leave the heat on for 20-30 minutes. Remove the heat if your skin turns bright red. This is especially important if you are unable to feel pain, heat, or cold. You may have a greater risk of getting burned.  General instructions If you were given a sedative during the procedure, it can affect you for several hours. Do not drive or operate machinery until your health  care provider says that it is safe. For the first 24 hours after the procedure: Do not sign important documents. Do not drink alcohol. Do your regular daily activities at a slower pace than normal. Eat soft foods that are easy to digest. Take over-the-counter and prescription medicines only as told by your health care provider. Keep all follow-up visits as told by your health care provider. This is important. Contact a health care provider if: You have blood in your stool 2-3 days after the procedure. Get help right away if you have: More than a small spotting of blood in your stool. Large blood clots in your stool. Swelling of your abdomen. Nausea or vomiting. A fever. Increasing pain in your abdomen that is not relieved with medicine. Summary After the procedure, it is common to have a small amount of blood in your stool. You may also have mild cramping and bloating of your abdomen. If you were given a sedative during the  procedure, it can affect you for several hours. Do not drive or operate machinery until your health care provider says that it is safe. Get help right away if you have a lot of blood in your stool, nausea or vomiting, a fever, or increased pain in your abdomen. This information is not intended to replace advice given to you by your health care provider. Make sure you discuss any questions you have with your healthcare provider. Document Revised: 09/18/2019 Document Reviewed: 04/20/2019 Elsevier Patient Education  [place] After This sheet gives you information about how to care for yourself after your procedure. Your health care provider may also give you more specific instructions. If you have problems or questions, contact your health careprovider. What can I expect after the procedure? After the procedure, it is common to have: Tiredness. Forgetfulness about what happened after the procedure. Impaired judgment for important decisions. Nausea or vomiting. Some difficulty with balance. Follow these instructions at home: For the time period you were told by your health care provider:     Rest as needed. Do not participate in activities where you could fall or become injured. Do not drive or use machinery. Do not drink alcohol. Do not take sleeping pills or medicines that cause drowsiness. Do not make important decisions or sign legal documents. Do not take care of children on your own. Eating and drinking Follow the diet that is recommended by your health care provider. Drink enough fluid to keep your urine pale yellow. If you vomit: Drink water, juice, or soup when you can drink without vomiting. Make sure you have little or no nausea before eating solid foods. General instructions Have a responsible adult stay with you for the time you are told. It is important to have someone help care for you until you are awake and alert. Take  over-the-counter and prescription medicines only as told by your health care provider. If you have sleep apnea, surgery and certain medicines can increase your risk for breathing problems. Follow instructions from your health care provider about wearing your sleep device: Anytime you are sleeping, including during daytime naps. While taking prescription pain medicines, sleeping medicines, or medicines that make you drowsy. Avoid smoking. Keep all follow-up visits as told by your health care provider. This is important. Contact a health care provider if: You keep feeling nauseous or you keep vomiting. You feel light-headed. You are still sleepy or having trouble with balance after 24 hours. You develop a rash. You have  a fever. You have redness or swelling around the IV site. Get help right away if: You have trouble breathing. You have new-onset confusion at home. Summary For several hours after your procedure, you may feel tired. You may also be forgetful and have poor judgment. Have a responsible adult stay with you for the time you are told. It is important to have someone help care for you until you are awake and alert. Rest as told. Do not drive or operate machinery. Do not drink alcohol or take sleeping pills. Get help right away if you have trouble breathing, or if you suddenly become confused. This information is not intended to replace advice given to you by your health care provider. Make sure you discuss any questions you have with your healthcare provider. Document Revised: 06/09/2020 Document Reviewed: 08/27/2019 Elsevier Patient Education  2022 [employer].

## 2021-04-13 ENCOUNTER — Encounter (HOSPITAL_COMMUNITY): Payer: Self-pay

## 2021-04-13 ENCOUNTER — Other Ambulatory Visit (HOSPITAL_COMMUNITY): Payer: Medicare Other

## 2021-04-13 ENCOUNTER — Other Ambulatory Visit: Payer: Self-pay

## 2021-04-13 ENCOUNTER — Encounter (HOSPITAL_COMMUNITY)
Admission: RE | Admit: 2021-04-13 | Discharge: 2021-04-13 | Disposition: A | Discharge status: Home / Self Care | Payer: Medicare Other | Source: Ambulatory Visit | Attending: Internal Medicine | Admitting: Internal Medicine

## 2021-04-13 DIAGNOSIS — Z01818 Encounter for other preprocedural examination: Secondary | ICD-10-CM | POA: Insufficient documentation

## 2021-04-17 ENCOUNTER — Ambulatory Visit (HOSPITAL_COMMUNITY): Payer: Medicare Other

## 2021-04-17 ENCOUNTER — Other Ambulatory Visit (HOSPITAL_COMMUNITY): Payer: Medicare Other

## 2021-04-17 ENCOUNTER — Other Ambulatory Visit: Payer: Self-pay

## 2021-04-17 ENCOUNTER — Encounter (HOSPITAL_COMMUNITY)
Admission: RE | Disposition: A | Discharge status: Home / Self Care | Payer: Self-pay | Source: Home / Self Care | Attending: Internal Medicine

## 2021-04-17 ENCOUNTER — Ambulatory Visit (HOSPITAL_COMMUNITY): Payer: Medicare Other | Admitting: Certified Registered Nurse Anesthetist

## 2021-04-17 ENCOUNTER — Ambulatory Visit (HOSPITAL_COMMUNITY)
Admission: RE | Admit: 2021-04-17 | Discharge: 2021-04-17 | Disposition: A | Discharge status: Home / Self Care | Payer: Medicare Other | Attending: Internal Medicine | Admitting: Internal Medicine

## 2021-04-17 ENCOUNTER — Encounter (HOSPITAL_COMMUNITY): Payer: Self-pay | Admitting: Internal Medicine

## 2021-04-17 DIAGNOSIS — Z1211 Encounter for screening for malignant neoplasm of colon: Secondary | ICD-10-CM | POA: Insufficient documentation

## 2021-04-17 DIAGNOSIS — Z7982 Long term (current) use of aspirin: Secondary | ICD-10-CM | POA: Insufficient documentation

## 2021-04-17 DIAGNOSIS — R1314 Dysphagia, pharyngoesophageal phase: Secondary | ICD-10-CM | POA: Insufficient documentation

## 2021-04-17 DIAGNOSIS — K219 Gastro-esophageal reflux disease without esophagitis: Secondary | ICD-10-CM | POA: Insufficient documentation

## 2021-04-17 DIAGNOSIS — D124 Benign neoplasm of descending colon: Secondary | ICD-10-CM | POA: Insufficient documentation

## 2021-04-17 DIAGNOSIS — K449 Diaphragmatic hernia without obstruction or gangrene: Secondary | ICD-10-CM | POA: Insufficient documentation

## 2021-04-17 DIAGNOSIS — Z9049 Acquired absence of other specified parts of digestive tract: Secondary | ICD-10-CM | POA: Insufficient documentation

## 2021-04-17 DIAGNOSIS — K222 Esophageal obstruction: Secondary | ICD-10-CM | POA: Insufficient documentation

## 2021-04-17 DIAGNOSIS — Z888 Allergy status to other drugs, medicaments and biological substances status: Secondary | ICD-10-CM | POA: Insufficient documentation

## 2021-04-17 DIAGNOSIS — R1013 Epigastric pain: Secondary | ICD-10-CM | POA: Insufficient documentation

## 2021-04-17 DIAGNOSIS — Z79899 Other long term (current) drug therapy: Secondary | ICD-10-CM | POA: Insufficient documentation

## 2021-04-17 DIAGNOSIS — Z86010 Personal history of colonic polyps: Secondary | ICD-10-CM | POA: Diagnosis not present

## 2021-04-17 HISTORY — PX: COLONOSCOPY WITH PROPOFOL: SHX5780

## 2021-04-17 HISTORY — PX: POLYPECTOMY: SHX5525

## 2021-04-17 HISTORY — PX: ESOPHAGOGASTRODUODENOSCOPY (EGD) WITH PROPOFOL: SHX5813

## 2021-04-17 HISTORY — PX: MALONEY DILATION: SHX5535

## 2021-04-17 SURGERY — COLONOSCOPY WITH PROPOFOL
Anesthesia: General

## 2021-04-17 MED ORDER — PROPOFOL 10 MG/ML IV BOLUS
INTRAVENOUS | Status: DC | PRN
  Administered 2021-04-17: 80 mg via INTRAVENOUS
  Administered 2021-04-17: 20 mg via INTRAVENOUS

## 2021-04-17 MED ORDER — PROPOFOL 10 MG/ML IV BOLUS
INTRAVENOUS | Status: AC
  Filled 2021-04-17: qty 20

## 2021-04-17 MED ORDER — PHENYLEPHRINE 40 MCG/ML (10ML) SYRINGE FOR IV PUSH (FOR BLOOD PRESSURE SUPPORT)
PREFILLED_SYRINGE | INTRAVENOUS | Status: AC
  Filled 2021-04-17: qty 20

## 2021-04-17 MED ORDER — LACTATED RINGERS IV SOLN
INTRAVENOUS | Status: DC
  Administered 2021-04-17: 1000 mL via INTRAVENOUS

## 2021-04-17 MED ORDER — PROPOFOL 500 MG/50ML IV EMUL
INTRAVENOUS | Status: DC | PRN
  Administered 2021-04-17: 150 ug/kg/min via INTRAVENOUS

## 2021-04-17 MED ORDER — STERILE WATER FOR IRRIGATION IR SOLN
Status: DC | PRN
  Administered 2021-04-17: 1.5 mL

## 2021-04-17 MED ORDER — PHENYLEPHRINE HCL (PRESSORS) 10 MG/ML IV SOLN
INTRAVENOUS | Status: DC | PRN
  Administered 2021-04-17 (×2): 80 ug via INTRAVENOUS

## 2021-04-17 MED ORDER — PROPOFOL 1000 MG/100ML IV EMUL
INTRAVENOUS | Status: AC
  Filled 2021-04-17: qty 300

## 2021-04-17 NOTE — Op Note (Signed)
[facility] Patient Name: Dorothy Lee Procedure Date: 04/17/2021 11:31 AM MRN: [id number] Date of Birth: [birth year] Attending MD: Norvel Richards , MD CSN: [id number] Age: 80 Admit Type: Outpatient Procedure:                Colonoscopy Indications:              High risk colon cancer surveillance: Personal                            history of colonic polyps Providers:                Norvel Richards, MD, Caprice Kluver, Nelma Rothman,                            Technician Referring MD:              Medicines:                Propofol per Anesthesia Complications:            No immediate complications. Estimated Blood Loss:     Estimated blood loss was minimal. Procedure:                Pre-Anesthesia Assessment:                           - Prior to the procedure, a History and Physical                            was performed, and patient medications and                            allergies were reviewed. The patient's tolerance of                            previous anesthesia was also reviewed. The risks                            and benefits of the procedure and the sedation                            options and risks were discussed with the patient.                            All questions were answered, and informed consent                            was obtained. Prior Anticoagulants: The patient has                            taken no previous anticoagulant or antiplatelet                            agents. ASA Grade Assessment: III - A patient with  severe systemic disease. After reviewing the risks                            and benefits, the patient was deemed in                            satisfactory condition to undergo the procedure.                           After obtaining informed consent, the colonoscope                            was passed under direct vision. Throughout the                            procedure, the patient's  blood pressure, pulse, and                            oxygen saturations were monitored continuously. The                            [id number]-[id number] ([id number]) scope was introduced through                            the anus and advanced to the the cecum, identified                            by appendiceal orifice and ileocecal valve. The                            colonoscopy was performed without difficulty. The                            patient tolerated the procedure well. The quality                            of the bowel preparation was adequate. Scope In: 11:35:46 AM Scope Out: 11:48:37 AM Scope Withdrawal Time: 0 hours 7 minutes 19 seconds  Total Procedure Duration: 0 hours 12 minutes 51 seconds  Findings:      The perianal and digital rectal examinations were normal.      A 4 mm polyp was found in the splenic flexure. The polyp was sessile.       The polyp was removed with a cold snare. Resection and retrieval were       complete. Estimated blood loss was minimal. Evidence of prior surgical       anastomosis at 20 to 25 cm.      The exam was otherwise without abnormality on direct and retroflexion       views. Impression:               - One 4 mm polyp at the splenic flexure, removed                            with a cold snare. Resected and retrieved. Evidence  of prior segmental risk section as described                           - The examination was otherwise normal on direct                            and retroflexion views. Moderate Sedation:      Moderate (conscious) sedation was personally administered by an       anesthesia professional. The following parameters were monitored: oxygen       saturation, heart rate, blood pressure, respiratory rate, EKG, adequacy       of pulmonary ventilation, and response to care. Recommendation:           - Patient has a contact number available for                            emergencies. The signs and symptoms  of potential                            delayed complications were discussed with the                            patient. Return to normal activities tomorrow.                            Written discharge instructions were provided to the                            patient.                           - Advance diet as tolerated.                           - Continue present medications.                           - Repeat colonoscopy date to be determined after                            pending pathology results are reviewed for                            surveillance based on pathology results.                           - Return to GI office in 1 year. Procedure Code(s):        --- Professional ---                           [phone], Colonoscopy, flexible; with removal of                            tumor(s), polyp(s), or other lesion(s) by snare  technique Diagnosis Code(s):        --- Professional ---                           Z86.010, Personal history of colonic polyps                           K63.5, Polyp of colon CPT copyright 2019 American Medical Association. All rights reserved. The codes documented in this report are preliminary and upon coder review may  be revised to meet current compliance requirements. Cristopher Estimable. Gaelan Glennon, MD Norvel Richards, MD 04/17/2021 12:00:00 PM This report has been signed electronically. Number of Addenda: 0

## 2021-04-17 NOTE — Transfer of Care (Signed)
Immediate Anesthesia Transfer of Care Note  Patient: Dorothy Lee  Procedure(s) Performed: COLONOSCOPY WITH PROPOFOL ESOPHAGOGASTRODUODENOSCOPY (EGD) WITH PROPOFOL [place]IL[place] POLYPECTOMY  Patient Location: Short Stay  Anesthesia Type:General  Level of Consciousness: drowsy  Airway & Oxygen Therapy: Patient Spontanous Breathing  Post-op Assessment: Report given to RN and Post -op Vital signs reviewed and stable  Post vital signs: Reviewed and stable  Last Vitals:  Vitals Value Taken Time  BP    Temp    Pulse    Resp    SpO2      Last Pain:  Vitals:   04/17/21 1115  TempSrc:   PainSc: 0-No pain      Patients Stated Pain Goal: 10 (81/15/72 6203)  Complications: No notable events documented.

## 2021-04-17 NOTE — Anesthesia Preprocedure Evaluation (Signed)
Anesthesia Evaluation  Patient identified by MRN, date of birth, ID band Patient awake    Reviewed: Allergy & Precautions, H&P , NPO status , Patient's Chart, lab work & pertinent test results, reviewed documented beta blocker date and time   Airway Mallampati: II  TM Distance: >3 FB Neck ROM: full    Dental no notable dental hx.    Pulmonary asthma ,    Pulmonary exam normal breath sounds clear to auscultation       Cardiovascular Exercise Tolerance: Good hypertension, + CAD and + Cardiac Stents   Rhythm:regular Rate:Normal     Neuro/Psych negative neurological ROS  negative psych ROS   GI/Hepatic Neg liver ROS, GERD  Medicated,  Endo/Other  negative endocrine ROS  Renal/GU negative Renal ROS  negative genitourinary   Musculoskeletal   Abdominal   Peds  Hematology negative hematology ROS (+)   Anesthesia Other Findings ? No diagnostic ST segment changes to indicate ischemia. ? Small, mild intensity, mid to basal inferoseptal defect that is partially reversible suggesting mild ischemic territory with possible component of scar. ? This is a low risk study. ? Nuclear stress EF: 86%.   Reproductive/Obstetrics negative OB ROS                             Anesthesia Physical Anesthesia Plan  ASA: 3  Anesthesia Plan: General   Post-op Pain Management:    Induction:   PONV Risk Score and Plan: Propofol infusion  Airway Management Planned:   Additional Equipment:   Intra-op Plan:   Post-operative Plan:   Informed Consent: I have reviewed the patients History and Physical, chart, labs and discussed the procedure including the risks, benefits and alternatives for the proposed anesthesia with the patient or authorized representative who has indicated his/her understanding and acceptance.     Dental Advisory Given  Plan Discussed with: CRNA  Anesthesia Plan Comments:          Anesthesia Quick Evaluation

## 2021-04-17 NOTE — Anesthesia Postprocedure Evaluation (Signed)
Anesthesia Post Note  Patient: Dorothy Lee  Procedure(s) Performed: COLONOSCOPY WITH PROPOFOL ESOPHAGOGASTRODUODENOSCOPY (EGD) WITH PROPOFOL [place] POLYPECTOMY  Patient location during evaluation: Phase II Anesthesia Type: General Level of consciousness: awake Pain management: pain level controlled Vital Signs Assessment: post-procedure vital signs reviewed and stable Respiratory status: spontaneous breathing and respiratory function stable Cardiovascular status: blood pressure returned to baseline and stable Postop Assessment: no headache and no apparent nausea or vomiting Anesthetic complications: no Comments: Late entry   No notable events documented.   Last Vitals:  Vitals:   04/17/21 1155 04/17/21 1201  BP: (!) 96/52 114/62  Pulse: 71   Resp: 18   Temp: 36.7 C   SpO2: 97%     Last Pain:  Vitals:   04/17/21 1158  TempSrc:   PainSc: 0-No pain                 Louann Sjogren

## 2021-04-17 NOTE — H&P (Signed)
@LOGO @   Primary Care Physician:  [name], MD Primary Gastroenterologist:  Dr. Gala Romney  Pre-Procedure History & Physical: HPI:  Dorothy Lee is a 80 y.o. female here for esophageal dysphagia and vague epigastric pain.  Also, history of multiple colonic adenomas previously.  Here for both an EGD with possible esophageal dilation and colonoscopy per plan. GERD well controlled on Protonix 40 mg once daily Past Medical History:  Diagnosis Date   Allergic rhinitis    CAD (coronary artery disease)    DES RCA and LAD 02/2008, DES mid LAD 11/2012   Cancer [facility])    Diverticulitis    Diverticulosis    Essential hypertension    GERD (gastroesophageal reflux disease)    Glaucoma    History of breast cancer    Right - s/p XRT 1984, surgery, no chemo   Hyperlipidemia    Personal history of radiation therapy    Rotator cuff syndrome, left    Scoliosis    Urinary incontinence     Past Surgical History:  Procedure Laterality Date   [place]   right breast   COLONOSCOPY  2008   Dr. Oletta Lamas: diverticulosis, hemorrhoids, conscious sedation   COLONOSCOPY N/A 01/30/2016   Dr. Gala Romney: mild divertiuculosis in sigmoid colon. narrowing of the colon in association with the deverticular opening. 6 polyps removed, tubular adenoma. next TCS in 3 years   CORONARY ANGIOPLASTY WITH STENT PLACEMENT  02/2008   ESOPHAGOGASTRODUODENOSCOPY  2014   Dr. Fuller Plan: erosive gastritis, no h.pylori   ESOPHAGOGASTRODUODENOSCOPY N/A 01/30/2016   Dr. Gala Romney: normal esophagus s/p dilation, small hh.   LEFT HEART CATHETERIZATION WITH CORONARY ANGIOGRAM N/A 12/02/2012   Procedure: LEFT HEART CATHETERIZATION WITH CORONARY ANGIOGRAM;  Surgeon: Jettie Booze, MD;  Location: [facility] CATH LAB;  Service: Cardiovascular;  Laterality: N/A;   MALONEY DILATION N/A 01/30/2016   Procedure: Venia Minks DILATION;  Surgeon: Daneil Dolin, MD;  Location: [facility] ENDO SUITE;  Service:  Endoscopy;  Laterality: N/A;   NASAL SINUS SURGERY     PARTIAL COLECTOMY N/A 05/19/2018   Procedure: PARTIAL COLECTOMY;  Surgeon: Aviva Signs, MD;  Location: [facility] ORS;  Service: General;  Laterality: N/A;   PERCUTANEOUS CORONARY STENT INTERVENTION (PCI-S)  12/02/2012   Procedure: PERCUTANEOUS CORONARY STENT INTERVENTION (PCI-S);  Surgeon: Jettie Booze, MD;  Location: [facility] CATH LAB;  Service: Cardiovascular;;  DES to the MID LAD    Prior to Admission medications   Medication Sig Start Date End Date Taking? Authorizing Provider  amLODipine (NORVASC) 5 MG tablet Take 5 mg by mouth daily.   Yes [provider]  aspirin EC 81 MG tablet Take 81 mg by mouth daily.    Yes [provider]  isosorbide mononitrate (IMDUR) 60 MG 24 hr tablet TAKE 1 TABLET BY MOUTH DAILY Patient taking differently: Take 30 mg by mouth daily. 06/14/20  Yes Satira Sark, MD  latanoprost (XALATAN) 0.005 % ophthalmic solution Place 1 drop into both eyes at bedtime.  07/28/14  Yes [provider]  meloxicam (MOBIC) 15 MG tablet Take 15 mg by mouth daily.   Yes [provider]  metoprolol succinate (TOPROL-XL) 25 MG 24 hr tablet TAKE 1 TABLET BY MOUTH DAILY Patient taking differently: Take 25 mg by mouth daily. 07/21/20  Yes Satira Sark, MD  montelukast (SINGULAIR) 10 MG tablet Take 10 mg by mouth daily.   Yes [provider]  nitroGLYCERIN (NITROSTAT)  0.4 MG SL tablet Place 1 tablet (0.4 mg total) under the tongue every 5 (five) minutes x 3 doses as needed for chest pain. May repeat x3 07/08/20  Yes Satira Sark, MD  pantoprazole (PROTONIX) 40 MG tablet TAKE 1 TABLET BY MOUTH DAILY Patient taking differently: Take 40 mg by mouth daily. 10/25/20  Yes Satira Sark, MD  rosuvastatin (CRESTOR) 10 MG tablet Take 10 mg by mouth daily.   Yes [provider]  Wheat Dextrin (BENEFIBER PO) Take 1 Dose by mouth daily.   Yes [provider]     Allergies as of 03/09/2021 - Review Complete 02/07/2021  Allergen Reaction Noted   Crestor [rosuvastatin]  08/11/2013   Lipitor [atorvastatin]  08/11/2013   Lisinopril Cough 02/16/2013   Simvastatin  08/11/2013    Family History  Problem Relation Age of Onset   Emphysema Father    Emphysema Brother    Heart disease Brother        x3 brothers   Lung cancer Brother    Throat cancer Brother        head/neck cancer   Heart attack Brother    Heart attack Sister    Heart attack Son        blood clot   Colon cancer Neg Hx     Social History   Socioeconomic History   Marital status: Married    Spouse name: Not on file   Number of children: 1   Years of education: Not on file   Highest education level: Not on file  Occupational History   Occupation: Probation officer   Occupation: COSMETOLOGY    Employer: [employer]  Tobacco Use   Smoking status: Never   Smokeless tobacco: Never   Tobacco comments:    Widowed, lives alone but has sig other.   Vaping Use   Vaping Use: Never used  Substance and Sexual Activity   Alcohol use: Not Currently   Drug use: No   Sexual activity: Yes    Birth control/protection: Post-menopausal  Other Topics Concern   Not on file  Social History Narrative   Widowed, husband died.    Lives alone but has significant other, married in 01/07/16 to [place].    Had one son, but he passed away.    Retired as Probation officer - [employer] and part-time at [employer]   Son died in 2016/01/07 of AMI. Has two grandsons.       Social Determinants of Health   Financial Resource Strain: Not on file  Food Insecurity: Not on file  Transportation Needs: Not on file  Physical Activity: Not on file  Stress: Not on file  Social Connections: Not on file  Intimate Partner Violence: Not on file    Review of Systems: See HPI, otherwise negative ROS  Physical Exam: BP 136/77   Pulse 96   Temp 98 F (36.7 C) (Oral)   Resp 16   SpO2  98%  General:   Alert,  Well-developed, well-nourished, pleasant and cooperative in NAD SNeck:  Supple; no masses or thyromegaly. No significant cervical adenopathy. Lungs:  Clear throughout to auscultation.   No wheezes, crackles, or rhonchi. No acute distress. Heart:  Regular rate and rhythm; no murmurs, clicks, rubs,  or gallops. Abdomen: Non-distended, normal bowel sounds.  Soft and nontender without appreciable mass or hepatosplenomegaly.  Pulses:  Normal pulses noted. Extremities:  Without clubbing or edema.  Impression/Plan: 80 year old lady here with esophageal dysphagia  and history of colon polyps.  I have offered her both an EGD with possible esophageal dilation as feasible/appropriate and surveillance colonoscopy per plan.  The risks, benefits, limitations, imponderables and alternatives regarding both EGD and colonoscopy have been reviewed with the patient. Questions have been answered. All parties agreeable.       Notice: This dictation was prepared with Dragon dictation along with smaller phrase technology. Any transcriptional errors that result from this process are unintentional and may not be corrected upon review.

## 2021-04-17 NOTE — Op Note (Signed)
[facility] Patient Name: Dorothy Lee Procedure Date: 04/17/2021 10:54 AM MRN: [id number] Date of Birth: Aug 15, 1941 Attending MD: Norvel Richards , MD CSN: [id number] Age: 80 Admit Type: Outpatient Procedure:                Upper GI endoscopy Indications:              Dysphagia Providers:                Norvel Richards, MD, Caprice Kluver, Nelma Rothman,                            Technician Referring MD:              Medicines:                Propofol per Anesthesia Complications:            No immediate complications. Estimated Blood Loss:     Estimated blood loss was minimal. Procedure:                Pre-Anesthesia Assessment:                           - Prior to the procedure, a History and Physical                            was performed, and patient medications and                            allergies were reviewed. The patient's tolerance of                            previous anesthesia was also reviewed. The risks                            and benefits of the procedure and the sedation                            options and risks were discussed with the patient.                            All questions were answered, and informed consent                            was obtained. Prior Anticoagulants: The patient has                            taken no previous anticoagulant or antiplatelet                            agents. ASA Grade Assessment: III - A patient with                            severe systemic disease. After reviewing the risks  and benefits, the patient was deemed in                            satisfactory condition to undergo the procedure.                           After obtaining informed consent, the endoscope was                            passed under direct vision. Throughout the                            procedure, the patient's blood pressure, pulse, and                            oxygen saturations were monitored  continuously. The                            GIF-[id number] ([id number]) was introduced through the                            mouth, and advanced to the second part of duodenum.                            The upper GI endoscopy was accomplished without                            difficulty. The patient tolerated the procedure                            well. Scope In: 11:19:48 AM Scope Out: 11:28:38 AM Total Procedure Duration: 0 hours 8 minutes 50 seconds  Findings:      A mild Schatzki ring was found at the gastroesophageal junction.       Esophagus otherwise appeared normal.      A small hiatal hernia was present. Otherwise, normal-appearing stomach      The duodenal bulb and second portion of the duodenum were normal. The       scope was withdrawn. Dilation was performed with a Maloney dilator with       mild resistance at 63 Fr. The scope was withdrawn. Dilation was       performed with a Maloney dilator with moderate resistance at 56 Fr. The       dilation site was examined following endoscope reinsertion and showed no       change. Estimated blood loss: none. Ring remained intact. Subsequently,       utilizing biopsy forceps, four-quadrant bites of the ring were taken to       disrupt it. This was done effectively. Impression:               - Mild Schatzki ring. Dilated /disrupted.                           - Small hiatal hernia.                           -  Normal duodenal bulb and second portion of the                            duodenum.                           - No specimens collected. Moderate Sedation:      Moderate (conscious) sedation was personally administered by an       anesthesia professional. The following parameters were monitored: oxygen       saturation, heart rate, blood pressure, respiratory rate, EKG, adequacy       of pulmonary ventilation, and response to care. Recommendation:           - Patient has a contact number available for                             emergencies. The signs and symptoms of potential                            delayed complications were discussed with the                            patient. Return to normal activities tomorrow.                            Written discharge instructions were provided to the                            patient.                           - Resume previous diet.                           - Continue present medications. Continue Protonix                            40 mg daily. Office visit with [place] in 1 year. See                            colonoscopy report. Procedure Code(s):        --- Professional ---                           [phone], Esophagogastroduodenoscopy, flexible,                            transoral; diagnostic, including collection of                            specimen(s) by brushing or washing, when performed                            (separate procedure)                           62703, Dilation of esophagus,  by unguided sound or                            bougie, single or multiple passes Diagnosis Code(s):        --- Professional ---                           K22.2, Esophageal obstruction                           K44.9, Diaphragmatic hernia without obstruction or                            gangrene                           R13.10, Dysphagia, unspecified CPT copyright 2019 American Medical Association. All rights reserved. The codes documented in this report are preliminary and upon coder review may  be revised to meet current compliance requirements. Cristopher Estimable. Romey Mathieson, MD Norvel Richards, MD 04/17/2021 11:34:29 AM This report has been signed electronically. Number of Addenda: 0

## 2021-04-18 ENCOUNTER — Encounter: Payer: Self-pay | Admitting: Internal Medicine

## 2021-04-18 LAB — SURGICAL PATHOLOGY

## 2021-04-21 ENCOUNTER — Encounter (HOSPITAL_COMMUNITY): Payer: Self-pay | Admitting: Internal Medicine

## 2021-05-03 ENCOUNTER — Ambulatory Visit (HOSPITAL_COMMUNITY)
Admission: RE | Admit: 2021-05-03 | Discharge: 2021-05-03 | Disposition: A | Discharge status: Home / Self Care | Payer: Medicare Other | Source: Ambulatory Visit | Attending: Family Medicine | Admitting: Family Medicine

## 2021-05-03 ENCOUNTER — Other Ambulatory Visit: Payer: Self-pay

## 2021-05-03 DIAGNOSIS — Z1231 Encounter for screening mammogram for malignant neoplasm of breast: Secondary | ICD-10-CM | POA: Insufficient documentation

## 2021-05-06 ENCOUNTER — Other Ambulatory Visit: Payer: Self-pay

## 2021-05-06 ENCOUNTER — Encounter (HOSPITAL_COMMUNITY): Payer: Self-pay | Admitting: Emergency Medicine

## 2021-05-06 ENCOUNTER — Emergency Department (HOSPITAL_COMMUNITY)
Admission: EM | Admit: 2021-05-06 | Discharge: 2021-05-06 | Disposition: A | Discharge status: Home / Self Care | Payer: Medicare Other | Attending: Emergency Medicine | Admitting: Emergency Medicine

## 2021-05-06 ENCOUNTER — Emergency Department (HOSPITAL_COMMUNITY): Payer: Medicare Other

## 2021-05-06 DIAGNOSIS — J0100 Acute maxillary sinusitis, unspecified: Secondary | ICD-10-CM | POA: Insufficient documentation

## 2021-05-06 DIAGNOSIS — Z79899 Other long term (current) drug therapy: Secondary | ICD-10-CM | POA: Insufficient documentation

## 2021-05-06 DIAGNOSIS — I2510 Atherosclerotic heart disease of native coronary artery without angina pectoris: Secondary | ICD-10-CM | POA: Diagnosis not present

## 2021-05-06 DIAGNOSIS — R42 Dizziness and giddiness: Secondary | ICD-10-CM | POA: Insufficient documentation

## 2021-05-06 DIAGNOSIS — Z955 Presence of coronary angioplasty implant and graft: Secondary | ICD-10-CM | POA: Insufficient documentation

## 2021-05-06 DIAGNOSIS — Z7982 Long term (current) use of aspirin: Secondary | ICD-10-CM | POA: Diagnosis not present

## 2021-05-06 DIAGNOSIS — Z923 Personal history of irradiation: Secondary | ICD-10-CM | POA: Insufficient documentation

## 2021-05-06 DIAGNOSIS — J45909 Unspecified asthma, uncomplicated: Secondary | ICD-10-CM | POA: Diagnosis not present

## 2021-05-06 DIAGNOSIS — I10 Essential (primary) hypertension: Secondary | ICD-10-CM | POA: Insufficient documentation

## 2021-05-06 DIAGNOSIS — Z853 Personal history of malignant neoplasm of breast: Secondary | ICD-10-CM | POA: Diagnosis not present

## 2021-05-06 DIAGNOSIS — R0789 Other chest pain: Secondary | ICD-10-CM | POA: Insufficient documentation

## 2021-05-06 LAB — TROPONIN I (HIGH SENSITIVITY)
Troponin I (High Sensitivity): 8 ng/L (ref <18)
Troponin I (High Sensitivity): 8 ng/L (ref <18)

## 2021-05-06 LAB — CBC
HCT: 42.1 % (ref 36.0–46.0)
Hemoglobin: 13.6 g/dL (ref 12.0–15.0)
MCH: 30.6 pg (ref 26.0–34.0)
MCHC: 32.3 g/dL (ref 30.0–36.0)
MCV: 94.6 fL (ref 80.0–100.0)
Platelets: 244 10*3/uL (ref 150–400)
RBC: 4.45 MIL/uL (ref 3.87–5.11)
RDW: 13 % (ref 11.5–15.5)
WBC: 5.7 10*3/uL (ref 4.0–10.5)
nRBC: 0 % (ref 0.0–0.2)

## 2021-05-06 LAB — HEPATIC FUNCTION PANEL
ALT: 23 U/L (ref 0–44)
AST: 27 U/L (ref 15–41)
Albumin: 3.8 g/dL (ref 3.5–5.0)
Alkaline Phosphatase: 60 U/L (ref 38–126)
Bilirubin, Direct: 0.1 mg/dL (ref 0.0–0.2)
Indirect Bilirubin: 0.8 mg/dL (ref 0.3–0.9)
Total Bilirubin: 0.9 mg/dL (ref 0.3–1.2)
Total Protein: 6.7 g/dL (ref 6.5–8.1)

## 2021-05-06 LAB — BASIC METABOLIC PANEL
Anion gap: 5 (ref 5–15)
BUN: 12 mg/dL (ref 8–23)
CO2: 27 mmol/L (ref 22–32)
Calcium: 8.6 mg/dL — ABNORMAL LOW (ref 8.9–10.3)
Chloride: 107 mmol/L (ref 98–111)
Creatinine, Ser: 0.81 mg/dL (ref 0.44–1.00)
GFR, Estimated: >60 mL/min (ref >60)
Glucose, Bld: 91 mg/dL (ref 70–99)
Potassium: 3.6 mmol/L (ref 3.5–5.1)
Sodium: 139 mmol/L (ref 135–145)

## 2021-05-06 MED ORDER — MECLIZINE HCL 25 MG PO TABS: 25.0000 mg | ORAL_TABLET | Freq: Three times a day (TID) | ORAL | 0 refills | Status: DC | PRN

## 2021-05-06 MED ORDER — SODIUM CHLORIDE 0.9 % IV BOLUS
500.0000 mL | Freq: Once | INTRAVENOUS | Status: AC
  Administered 2021-05-06: 500 mL via INTRAVENOUS

## 2021-05-06 MED ORDER — METHYLPREDNISOLONE SODIUM SUCC 125 MG IJ SOLR
125.0000 mg | Freq: Once | INTRAMUSCULAR | Status: AC
  Administered 2021-05-06: 125 mg via INTRAVENOUS
  Filled 2021-05-06: qty 2

## 2021-05-06 MED ORDER — AMOXICILLIN-POT CLAVULANATE 875-125 MG PO TABS: 1.0000 | ORAL_TABLET | Freq: Two times a day (BID) | ORAL | 0 refills | Status: DC

## 2021-05-06 MED ORDER — ONDANSETRON 4 MG PO TBDP
ORAL_TABLET | ORAL | 0 refills | Status: DC
Start: 2021-05-06 — End: 2021-08-10

## 2021-05-06 MED ORDER — MECLIZINE HCL 12.5 MG PO TABS
25.0000 mg | ORAL_TABLET | Freq: Once | ORAL | Status: AC
  Administered 2021-05-06: 25 mg via ORAL
  Filled 2021-05-06: qty 2

## 2021-05-06 NOTE — ED Notes (Signed)
Patient transported to X-ray 

## 2021-05-06 NOTE — ED Provider Notes (Signed)
[facility] EMERGENCY DEPARTMENT Provider Note   CSN: [id number] Arrival date & time: 05/06/21  0900     History Chief Complaint  Patient presents with   Chest Pain    Dorothy Lee is a 80 y.o. female.  Patient complains of vertigo symptoms.  She has a history of sinus infection.  She also had some chest discomfort  The history is provided by the patient and medical records. No language interpreter was used.  Dizziness Quality:  Head spinning Severity:  Mild Onset quality:  Sudden Duration:  4 days Timing:  Constant Progression:  Worsening Chronicity:  New Context: bending over   Relieved by:  Nothing Worsened by:  Nothing Ineffective treatments:  None tried Associated symptoms: no chest pain, no diarrhea and no headaches       Past Medical History:  Diagnosis Date   Allergic rhinitis    CAD (coronary artery disease)    DES RCA and LAD 02/2008, DES mid LAD 11/2012   Cancer [facility] [place])    Diverticulitis    Diverticulosis    Essential hypertension    GERD (gastroesophageal reflux disease)    Glaucoma    History of breast cancer    Right - s/p XRT 1984, surgery, no chemo   Hyperlipidemia    Personal history of radiation therapy    Rotator cuff syndrome, left    Scoliosis    Urinary incontinence     Patient Active Problem List   Diagnosis Date Noted   Chest pain, musculoskeletal 12/17/2018   Wound infection after surgery    S/P partial colectomy 05/19/2018   Sigmoid diverticulosis    Dyspareunia in female 01/24/2018   Diverticulitis of large intestine with perforation without abscess or bleeding    Diverticulitis 11/17/2017   Suprapubic abdominal pain 10/22/2017   Lower urinary tract symptoms (LUTS) 10/22/2017   Constipation 04/24/2017   Fatty liver 04/24/2017   Solitary pulmonary nodule 10/05/2016   LLQ pain 08/10/2016   History of colonic polyps    Diverticulosis of colon without hemorrhage    Dysphagia    Esophageal dysphagia 01/05/2016   Acute  diverticulitis 01/05/2016   Postoperative breast asymmetry 06/02/2015   Cough variant asthma 01/19/2014   Weight loss, unintentional 09/29/2013   Upper airway cough syndrome 11/20/2010   Chronic sinusitis 11/08/2010   ALLERGIC RHINITIS 11/08/2010   PALPITATIONS, HX OF 10/05/2010   ROTATOR CUFF SYNDROME, LEFT 09/25/2010   GERD 09/22/2010   URINARY INCONTINENCE 09/22/2010   Dyslipidemia 09/15/2010   Essential hypertension 09/15/2010   Coronary atherosclerosis 09/15/2010   BREAST CANCER, HX OF 09/15/2010    Past Surgical History:  Procedure Laterality Date   [place]   BREAST LUMPECTOMY  1982   right breast   COLONOSCOPY  2008   Dr. Oletta Lamas: diverticulosis, hemorrhoids, conscious sedation   COLONOSCOPY N/A 01/30/2016   Dr. Gala Romney: mild divertiuculosis in sigmoid colon. narrowing of the colon in association with the deverticular opening. 6 polyps removed, tubular adenoma. next TCS in 3 years   COLONOSCOPY WITH PROPOFOL N/A 04/17/2021   Procedure: COLONOSCOPY WITH PROPOFOL;  Surgeon: Daneil Dolin, MD;  [facility]: [facility] ENDO [facility];  Service: Endoscopy;  Laterality: N/A;  11:00am   CORONARY ANGIOPLASTY WITH STENT PLACEMENT  02/2008   ESOPHAGOGASTRODUODENOSCOPY  2014   Dr. Fuller Plan: erosive gastritis, no h.pylori   ESOPHAGOGASTRODUODENOSCOPY N/A 01/30/2016   Dr. Gala Romney: normal esophagus s/p dilation, small hh.   ESOPHAGOGASTRODUODENOSCOPY (EGD) WITH PROPOFOL N/A 04/17/2021  Procedure: ESOPHAGOGASTRODUODENOSCOPY (EGD) WITH PROPOFOL;  Surgeon: Daneil Dolin, MD;  Location: [facility] ENDO [facility]ITE;  Service: Endoscopy;  Laterality: N/A;   LEFT HEART CATHETERIZATION WITH CORONARY ANGIOGRAM N/A 12/02/2012   Procedure: LEFT HEART CATHETERIZATION WITH CORONARY ANGIOGRAM;  Surgeon: Jettie Booze, MD;  Location: [facility] CATH LAB;  Service: Cardiovascular;  Laterality: N/A;   MALONEY DILATION N/A 01/30/2016   Procedure: Venia Minks DILATION;  Surgeon: Daneil Dolin, MD;  Location: [facility]  ENDO SUITE;  Service: Endoscopy;  Laterality: N/A;   MALONEY DILATION N/A 04/17/2021   Procedure: Venia Minks DILATION;  Surgeon: Daneil Dolin, MD;  Location: [facility] ENDO SU[facility];  Service: Endoscopy;  Laterality: N/A;   NASAL SINUS SURGERY     PARTIAL COLECTOMY N/A 05/19/2018   Procedure: PARTIAL COLECTOMY;  Surgeon: Aviva Signs, MD;  Location: [facility] ORS;  Service: General;  Laterality: N/A;   PERCUTANEOUS CORONARY STENT INTERVENTION (PCI-S)  12/02/2012   Procedure: PERCUTANEOUS CORONARY STENT INTERVENTION (PCI-S);  Surgeon: Jettie Booze, MD;  Location: [facility] CATH LAB;  Service: Cardiovascular;;  DES to the MID LAD   POLYPECTOMY  04/17/2021   Procedure: POLYPECTOMY;  Surgeon: Daneil Dolin, MD;  Location: [facility] ENDO SU[facility];  Service: Endoscopy;;     OB History     Gravida  2   Para  1   Term  1   Preterm      AB  1   Living         SAB  1   IAB      Ectopic      Multiple      Live Births  1           Family History  Problem Relation Age of Onset   Emphysema Father    Emphysema Brother    Heart disease Brother        x3 brothers   Lung cancer Brother    Throat cancer Brother        head/neck cancer   Heart attack Brother    Heart attack Sister    Heart attack Son        blood clot   Colon cancer Neg Hx     Social History   Tobacco Use   Smoking status: Never   Smokeless tobacco: Never   Tobacco comments:    Widowed, lives alone but has sig other.   Vaping Use   Vaping Use: Never used  Substance Use Topics   Alcohol use: Not Currently   Drug use: No    Home Medications Prior to Admission medications   Medication Sig Start Date End Date Taking? Authorizing Provider  amoxicillin-clavulanate (AUGMENTIN) 875-125 MG tablet Take 1 tablet by mouth every 12 (twelve) hours. 05/06/21  Yes Milton Ferguson, MD  meclizine (ANTIVERT) 25 MG tablet Take 1 tablet (25 mg total) by mouth 3 (three) times daily as needed for dizziness. 05/06/21  Yes Milton Ferguson, MD   ondansetron (ZOFRAN ODT) 4 MG disintegrating tablet '4mg'$  ODT q4 hours prn nausea/vomit 05/06/21  Yes Milton Ferguson, MD  amLODipine (NORVASC) 5 MG tablet Take 5 mg by mouth daily.    [provider]  aspirin EC 81 MG tablet Take 81 mg by mouth daily.     [provider]  isosorbide mononitrate (IMDUR) 60 MG 24 hr tablet TAKE 1 TABLET BY MOUTH DAILY Patient taking differently: Take 30 mg by mouth daily. 06/14/20   Satira Sark, MD  latanoprost (XALATAN) 0.005 % ophthalmic solution Place 1  drop into both eyes at bedtime.  07/28/14   [provider]  meloxicam (MOBIC) 15 MG tablet Take 15 mg by mouth daily.    [provider]  metoprolol succinate (TOPROL-XL) 25 MG 24 hr tablet TAKE 1 TABLET BY MOUTH DAILY Patient taking differently: Take 25 mg by mouth daily. 07/21/20   Satira Sark, MD  montelukast (SINGULAIR) 10 MG tablet Take 10 mg by mouth daily.    [provider]  nitroGLYCERIN (NITROSTAT) 0.4 MG SL tablet Place 1 tablet (0.4 mg total) under the tongue every 5 (five) minutes x 3 doses as needed for chest pain. May repeat x3 07/08/20   Satira Sark, MD  pantoprazole (PROTONIX) 40 MG tablet TAKE 1 TABLET BY MOUTH DAILY Patient taking differently: Take 40 mg by mouth daily. 10/25/20   Satira Sark, MD  rosuvastatin (CRESTOR) 10 MG tablet Take 10 mg by mouth daily.    [provider]  Wheat Dextrin (BENEFIBER PO) Take 1 Dose by mouth daily.    [provider]    Allergies    Crestor [rosuvastatin], Lipitor [atorvastatin], Lisinopril, and Simvastatin  Review of Systems   Review of Systems  Constitutional:  Negative for appetite change and fatigue.  HENT:  Negative for congestion, ear discharge and sinus pressure.   Eyes:  Negative for discharge.  Respiratory:  Negative for cough.   Cardiovascular:  Negative for chest pain.       Chest pressure  Gastrointestinal:  Negative for abdominal pain and diarrhea.   Genitourinary:  Negative for frequency and hematuria.  Musculoskeletal:  Negative for back pain.  Skin:  Negative for rash.  Neurological:  Positive for dizziness. Negative for seizures and headaches.  Psychiatric/Behavioral:  Negative for hallucinations.    Physical Exam Updated Vital Signs BP (!) 141/72 (BP Location: Left Arm)   Pulse 71   Temp 98 F (36.7 C) (Oral)   Resp 17   Ht '5\' 1"'$  (1.549 m)   Wt 60.8 kg   SpO2 97%   BMI 25.33 kg/m   Physical Exam Vitals reviewed.  Constitutional:      Appearance: She is well-developed.  HENT:     Head: Normocephalic.     Nose: Nose normal.  Eyes:     General: No scleral icterus.    Conjunctiva/sclera: Conjunctivae normal.  Neck:     Thyroid: No thyromegaly.  Cardiovascular:     Rate and Rhythm: Normal rate and regular rhythm.     Heart sounds: No murmur heard.   No friction rub. No gallop.  Pulmonary:     Breath sounds: No stridor. No wheezing or rales.  Chest:     Chest wall: No tenderness.  Abdominal:     General: There is no distension.     Tenderness: There is no abdominal tenderness. There is no rebound.  Musculoskeletal:        General: Normal range of motion.     Cervical back: Neck supple.  Lymphadenopathy:     Cervical: No cervical adenopathy.  Skin:    Findings: No erythema or rash.  Neurological:     Mental Status: She is oriented to person, place, and time.     Motor: No abnormal muscle tone.     Coordination: Coordination normal.  Psychiatric:        Behavior: Behavior normal.    ED Results / Procedures / Treatments   Labs (all labs ordered are listed, but only abnormal results are displayed) Labs Reviewed  BASIC METABOLIC PANEL - Abnormal; Notable for the following components:      Result Value   Calcium 8.6 (*)    All other components within normal limits  CBC  HEPATIC FUNCTION PANEL  TROPONIN I (HIGH SENSITIVITY)  TROPONIN I (HIGH SENSITIVITY)    EKG None  Radiology DG Chest 2  View  Result Date: 05/06/2021 CLINICAL DATA:  Dizziness and chest pain. EXAM: CHEST - 2 VIEW COMPARISON:  Chest radiograph 11/14/2017. FINDINGS: Multiple monitoring leads overlie the patient. Stable cardiac and mediastinal contours. Elevation of the right hemidiaphragm. Bibasilar atelectasis. No large area pulmonary consolidation. Biapical pleuroparenchymal thickening. Right axillary surgical clips. Thoracic spine degenerative changes. Left shoulder joint degenerative changes. IMPRESSION: No acute cardiopulmonary process. Electronically Signed   By: Lovey Newcomer M.D.   On: 05/06/2021 10:25   CT Head Wo Contrast  Result Date: 05/06/2021 CLINICAL DATA:  Dizziness and chest pain for 3 days. History of hypertension. Evaluate for hemorrhage. EXAM: CT HEAD WITHOUT CONTRAST TECHNIQUE: Contiguous axial images were obtained from the base of the skull through the vertex without intravenous contrast. COMPARISON:  Brain CT 12/10/2019 FINDINGS: Brain: Ventricles and sulci are appropriate for patient's age. No evidence for acute cortically based infarct, intracranial hemorrhage, mass lesion or mass-effect. Periventricular and subcortical white matter hypodensities compatible with chronic microvascular ischemic changes. Old left cerebellar infarct. Vascular: Unremarkable Skull: Intact. Sinuses/Orbits: Fluid level within the right maxillary sinus. Fluid level within the right greater than left sphenoid sinus. Mastoid air cells are unremarkable. Other: None. IMPRESSION: No acute intracranial process. Fluid level within the right maxillary and right sphenoid sinuses as can be seen with acute sinusitis. Electronically Signed   By: Lovey Newcomer M.D.   On: 05/06/2021 10:48    Procedures Procedures   Medications Ordered in ED Medications  meclizine (ANTIVERT) tablet 25 mg (25 mg Oral Given 05/06/21 1002)  methylPREDNISolone sodium succinate (SOLU-MEDROL) 125 mg/2 mL injection 125 mg (125 mg Intravenous Given 05/06/21 1002)   sodium chloride 0.9 % bolus 500 mL (0 mLs Intravenous Stopped 05/06/21 1110)    ED Course  I have reviewed the triage vital signs and the nursing notes.  Pertinent labs & imaging results that were available during my care of the patient were reviewed by me and considered in my medical decision making (see chart for details).    MDM Rules/Calculators/A&P                           Patient with sinusitis and vertigo she is given Augmentin and Antivert and Zofran.  Also patient with chest pain atypical.  She will follow-up with her family doctor for that or follow-up with cardiology Final Clinical Impression(s) / ED Diagnoses Final diagnoses:  Atypical chest pain  Acute maxillary sinusitis, recurrence not specified    Rx / DC Orders ED Discharge Orders          Ordered    amoxicillin-clavulanate (AUGMENTIN) 875-125 MG tablet  Every 12 hours        05/06/21 1330    ondansetron (ZOFRAN ODT) 4 MG disintegrating tablet        05/06/21 1330    meclizine (ANTIVERT) 25 MG tablet  3 times daily PRN        05/06/21 1330             Milton Ferguson, MD 05/08/21 1048

## 2021-05-06 NOTE — Discharge Instructions (Addendum)
Drink plenty of fluids.  Follow-up with your ENT doctor as scheduled.  You also have been referred to Dr. Redmond Baseman and you can see him or any of his colleagues.  Return if problem.  He also has been referred to cardiology for the chest discomfort you have been having.  Continue to follow-up with him next week also

## 2021-05-06 NOTE — ED Triage Notes (Signed)
Pt to the ED with c/o dizziness and chest pain for the past 3 days.  Pt states the pain feels like pressure and does not radiate.

## 2021-05-06 NOTE — ED Notes (Signed)
Patient transported to CT 

## 2021-05-15 ENCOUNTER — Telehealth: Payer: Self-pay | Admitting: Gastroenterology

## 2021-05-15 NOTE — Telephone Encounter (Signed)
Pt states that she is experiencing abd pain and feels bloated. Pt states that she has been experiencing constipation since her colonoscopy on 04/17/2021. Pt denies any rectal bleeding, nausea, vomiting or fever. Pt had on very small bm this morning, and states that it was two small balls and very hard. Pt is taking 2 teaspoons of benefiber once daily in the morning. Pt also denies taking any other meds for constipation.

## 2021-05-16 NOTE — Telephone Encounter (Signed)
Pt verbalized understanding and will call back with an update on how it is going.

## 2021-05-16 NOTE — Telephone Encounter (Signed)
She can add 1 capful of Miralax with 8 ounces of water mixed well once to twice a day to get things going, then take each evening as needed. Have her give an update with how that is going!

## 2021-05-17 ENCOUNTER — Other Ambulatory Visit: Payer: Self-pay | Admitting: Cardiology

## 2021-05-17 DIAGNOSIS — I10 Essential (primary) hypertension: Secondary | ICD-10-CM

## 2021-06-09 ENCOUNTER — Encounter: Payer: Self-pay | Admitting: Cardiology

## 2021-06-09 ENCOUNTER — Ambulatory Visit (INDEPENDENT_AMBULATORY_CARE_PROVIDER_SITE_OTHER): Payer: Medicare Other | Admitting: Cardiology

## 2021-06-09 VITALS — BP 120/66 | HR 75 | Ht 60.0 in | Wt 137.2 lb

## 2021-06-09 DIAGNOSIS — E782 Mixed hyperlipidemia: Secondary | ICD-10-CM

## 2021-06-09 DIAGNOSIS — R42 Dizziness and giddiness: Secondary | ICD-10-CM

## 2021-06-09 DIAGNOSIS — I25119 Atherosclerotic heart disease of native coronary artery with unspecified angina pectoris: Secondary | ICD-10-CM | POA: Diagnosis not present

## 2021-06-09 DIAGNOSIS — H81393 Other peripheral vertigo, bilateral: Secondary | ICD-10-CM

## 2021-06-09 NOTE — Progress Notes (Signed)
Cardiology Office Note  Date: 06/09/2021   ID: [name], DOB 1941-04-05, MRN [id number]  PCP:  [name], MD  Cardiologist:  [name], MD Electrophysiologist:  None   Chief Complaint  Patient presents with   Cardiac follow-up    History of Present Illness: Dorothy Lee is an 80 y.o. female last seen in February.  She is here for a routine visit.  She does not describe any worsening angina on a regular basis with typical activities or increasing nitroglycerin use.  I reviewed her records.  She was seen in the ER in July for evaluation of chest pain, cardiac enzymes were normal at that time and ECG showed no acute changes.  Her main complaint however was vertigo and she was diagnosed with sinusitis at that time.  She tells me that she has continued to have significant vertigo symptoms, has not yet been able to see her PCP.  States that when she sits up from supine position she feels like the room is spinning, cannot get up quickly without significant balance problems.  She has been taking meclizine with some help but not consistently.  No headache or visual changes.  No fevers or chills.  Follow-up Lexiscan Myoview in March of this year was low risk with a mid to basal inferoseptal defect that was partially reversible suggesting component of scar with mild peri-infarct ischemia, vigorous LVEF at 86%.  I reviewed her medications which are noted below.  Past Medical History:  Diagnosis Date   Allergic rhinitis    CAD (coronary artery disease)    DES RCA and LAD 02/2008, DES mid LAD 11/2012   Cancer [facility])    Diverticulitis    Diverticulosis    Essential hypertension    GERD (gastroesophageal reflux disease)    Glaucoma    History of breast cancer    Right - s/p XRT 1984, surgery, no chemo   Hyperlipidemia    Personal history of radiation therapy    Rotator cuff syndrome, left    Scoliosis    Urinary incontinence     Past Surgical History:  Procedure  Laterality Date   [place]   right breast   COLONOSCOPY  2008   Dr. Oletta Lamas: diverticulosis, hemorrhoids, conscious sedation   COLONOSCOPY N/A 01/30/2016   Dr. Gala Romney: mild divertiuculosis in sigmoid colon. narrowing of the colon in association with the deverticular opening. 6 polyps removed, tubular adenoma. next TCS in 3 years   COLONOSCOPY WITH PROPOFOL N/A 04/17/2021   Procedure: COLONOSCOPY WITH PROPOFOL;  Surgeon: Daneil Dolin, MD;  [facility];  Service: Endoscopy;  Laterality: N/A;  11:00am   CORONARY ANGIOPLASTY WITH STENT PLACEMENT  02/2008   ESOPHAGOGASTRODUODENOSCOPY  2014   Dr. Fuller Plan: erosive gastritis, no h.pylori   ESOPHAGOGASTRODUODENOSCOPY N/A 01/30/2016   Dr. Gala Romney: normal esophagus s/p dilation, small hh.   ESOPHAGOGASTRODUODENOSCOPY (EGD) WITH PROPOFOL N/A 04/17/2021   Procedure: ESOPHAGOGASTRODUODENOSCOPY (EGD) WITH PROPOFOL;  Surgeon: Daneil Dolin, MD;  Location: [facility];  Service: Endoscopy;  Laterality: N/A;   LEFT HEART CATHETERIZATION WITH CORONARY ANGIOGRAM N/A 12/02/2012   Procedure: LEFT HEART CATHETERIZATION WITH CORONARY ANGIOGRAM;  Surgeon: Jettie Booze, MD;  Location: [facility] CATH LAB;  Service: Cardiovascular;  Laterality: N/A;   MALONEY DILATION N/A 01/30/2016   Procedure: Venia Minks DILATION;  Surgeon: Daneil Dolin, MD;  [facility];  Service: Endoscopy;  Laterality:  N/A;   Venia Minks DILATION N/A 04/17/2021   Procedure: Venia Minks DILATION;  Surgeon: Daneil Dolin, MD;  [facility];  Service: Endoscopy;  Laterality: N/A;   NASAL SINUS SURGERY     PARTIAL COLECTOMY N/A 05/19/2018   Procedure: PARTIAL COLECTOMY;  Surgeon: Aviva Signs, MD;  Location: [facility] ORS;  Service: General;  Laterality: N/A;   PERCUTANEOUS CORONARY STENT INTERVENTION (PCI-S)  12/02/2012   Procedure: PERCUTANEOUS CORONARY STENT INTERVENTION (PCI-S);  Surgeon: Jettie Booze, MD;  Location:  [facility] CATH LAB;  Service: Cardiovascular;;  DES to the MID LAD   POLYPECTOMY  04/17/2021   Procedure: POLYPECTOMY;  Surgeon: Daneil Dolin, MD;  [facility];  Service: Endoscopy;;    Current Outpatient Medications  Medication Sig Dispense Refill   amLODipine (NORVASC) 5 MG tablet Take 5 mg by mouth daily.     aspirin EC 81 MG tablet Take 81 mg by mouth daily.      isosorbide mononitrate (IMDUR) 60 MG 24 hr tablet TAKE 1 TABLET BY MOUTH DAILY (Patient taking differently: Take 30 mg by mouth daily.) 90 tablet 3   latanoprost (XALATAN) 0.005 % ophthalmic solution Place 1 drop into both eyes at bedtime.      meclizine (ANTIVERT) 25 MG tablet Take 1 tablet (25 mg total) by mouth 3 (three) times daily as needed for dizziness. 30 tablet 0   meloxicam (MOBIC) 15 MG tablet Take 15 mg by mouth daily.     metoprolol succinate (TOPROL-XL) 25 MG 24 hr tablet TAKE 1 TABLET BY MOUTH DAILY 90 tablet 2   montelukast (SINGULAIR) 10 MG tablet Take 10 mg by mouth daily.     nitroGLYCERIN (NITROSTAT) 0.4 MG SL tablet Place 1 tablet (0.4 mg total) under the tongue every 5 (five) minutes x 3 doses as needed for chest pain. May repeat x3 25 tablet 3   pantoprazole (PROTONIX) 40 MG tablet TAKE 1 TABLET BY MOUTH DAILY (Patient taking differently: Take 40 mg by mouth daily.) 90 tablet 3   rosuvastatin (CRESTOR) 10 MG tablet Take 10 mg by mouth daily.     amoxicillin-clavulanate (AUGMENTIN) 875-125 MG tablet Take 1 tablet by mouth every 12 (twelve) hours. (Patient not taking: Reported on 06/09/2021) 28 tablet 0   ondansetron (ZOFRAN ODT) 4 MG disintegrating tablet '4mg'$  ODT q4 hours prn nausea/vomit (Patient not taking: Reported on 06/09/2021) 12 tablet 0   Wheat Dextrin (BENEFIBER PO) Take 1 Dose by mouth daily. (Patient not taking: Reported on 06/09/2021)     No current facility-administered medications for this visit.   Allergies:  Crestor [rosuvastatin], Lipitor [atorvastatin], Lisinopril, and Simvastatin   ROS:  No palpitations or syncope.  Physical Exam: VS:  BP 120/66   Pulse 75   Ht 5' (1.524 m)   Wt 137 lb 3.2 oz (62.2 kg)   SpO2 97%   BMI 26.80 kg/m , BMI Body mass index is 26.8 kg/m.  Wt Readings from Last 3 Encounters:  06/09/21 137 lb 3.2 oz (62.2 kg)  05/06/21 134 lb 0.6 oz (60.8 kg)  04/13/21 134 lb (60.8 kg)    General: Elderly woman, appears comfortable at rest. HEENT: Conjunctiva and lids normal, wearing a mask. Neck: Supple, no elevated JVP or carotid bruits, no thyromegaly. Lungs: Clear to auscultation, nonlabored breathing at rest. Cardiac: Regular rate and rhythm, no S3 or significant systolic murmur, no pericardial rub. Extremities: No pitting edema.  ECG:  An ECG dated 05/06/2021 was personally reviewed today and demonstrated:  Sinus  rhythm with incomplete right bundle branch block and left anterior fascicular block.  Recent Labwork: 05/06/2021: ALT 23; AST 27; BUN 12; Creatinine, Ser 0.81; Hemoglobin 13.6; Platelets 244; Potassium 3.6; Sodium 139     Component Value Date/Time   CHOL 181 05/30/2020 1232   TRIG 115 05/30/2020 1232   TRIG 119 05/16/2010 0000   HDL 59 05/30/2020 1232   CHOLHDL 3.1 05/30/2020 1232   VLDL 23 05/30/2020 1232   LDLCALC 99 05/30/2020 1232   LDLCALC 99 05/05/2019 1124   LDLDIRECT 144.4 11/09/2013 0934    Other Studies Reviewed Today:  Lexiscan Myoview 12/09/2020: No diagnostic ST segment changes to indicate ischemia. Small, mild intensity, mid to basal inferoseptal defect that is partially reversible suggesting mild ischemic territory with possible component of scar. This is a low risk study. Nuclear stress EF: 86%.  Chest x-ray 05/06/2021: FINDINGS: Multiple monitoring leads overlie the patient. Stable cardiac and mediastinal contours. Elevation of the right hemidiaphragm. Bibasilar atelectasis. No large area pulmonary consolidation. Biapical pleuroparenchymal thickening. Right axillary surgical clips. Thoracic spine degenerative  changes. Left shoulder joint degenerative changes.   IMPRESSION: No acute cardiopulmonary process.  Assessment and Plan:  1.  CAD status post DES to the RCA and LAD in 2009 with subsequent DES to the LAD in 2014.  Follow-up Myoview in March of this year showed a mild ischemic territory in the mid to basal inferoseptal wall with vigorous LVEF.  She does have intermittent angina symptoms on medical therapy, not accelerating however.  ER visit noted in July with normal cardiac enzymes and no acute ST segment changes.  Plan to continue current regimen including aspirin, Imdur, Norvasc, Toprol-XL, and Crestor.  2.  Persistent vertigo as discussed above.  She is very bothered by her symptoms, has not been able to see PCP as yet.  Head CT in July showed probable right maxillary and right sphenoid sinusitis.  She does not report any headaches at this time.  Referral being made to ENT.  3.  Mixed hyperlipidemia, on Crestor.  Medication Adjustments/Labs and Tests Ordered: Current medicines are reviewed at length with the patient today.  Concerns regarding medicines are outlined above.   Tests Ordered: Orders Placed This Encounter  Procedures   Ambulatory referral to ENT     Medication Changes: No orders of the defined types were placed in this encounter.   Disposition:  Follow up  6 months.  Signed, Satira Sark, MD, [facility] 06/09/2021 1:09 PM    [place] [facility] [address] [place], [place] [address] Phone: [phone]; Fax: [phone]

## 2021-06-09 NOTE — Patient Instructions (Signed)
Medication Instructions:  Your physician recommends that you continue on your current medications as directed. Please refer to the Current Medication list given to you today.  *If you need a refill on your cardiac medications before your next appointment, please call your pharmacy*   Lab Work: None today  If you have labs (blood work) drawn today and your tests are completely normal, you will receive your results only by: [place] (if you have MyChart) OR A paper copy in the mail If you have any lab test that is abnormal or we need to change your treatment, we will call you to review the results.   Testing/Procedures: None today    Follow-Up: At [facility], you and your health needs are our priority.  As part of our continuing mission to provide you with exceptional heart care, we have created designated Provider Care Teams.  These Care Teams include your primary Cardiologist (physician) and Advanced Practice Providers (APPs -  Physician Assistants and Nurse Practitioners) who all work together to provide you with the care you need, when you need it.  We recommend signing up for the patient portal called "MyChart".  Sign up information is provided on this After Visit Summary.  MyChart is used to connect with patients for Virtual Visits (Telemedicine).  Patients are able to view lab/test results, encounter notes, upcoming appointments, etc.  Non-urgent messages can be sent to your provider as well.   To learn more about what you can do with MyChart, go to [email or web address].    Your next appointment:   6 month(s)  The format for your next appointment:   In Person  Provider:   You may see [name], MD   Other Instructions You have been referred to  ENT

## 2021-06-14 ENCOUNTER — Other Ambulatory Visit: Payer: Self-pay

## 2021-06-14 DIAGNOSIS — H81393 Other peripheral vertigo, bilateral: Secondary | ICD-10-CM

## 2021-06-29 ENCOUNTER — Ambulatory Visit: Payer: Medicare Other | Admitting: Cardiology

## 2021-08-04 ENCOUNTER — Telehealth: Payer: Self-pay | Admitting: Cardiology

## 2021-08-04 NOTE — Telephone Encounter (Signed)
STAT if HR is under 50 or over 120 (normal HR is 60-100 beats per minute)  What is your heart rate?  This morning BP 186/99 HR 101 Later on in the morning 133/85 HR 107  Do you have a log of your heart rate readings (document readings)? no  Do you have any other symptoms? She felt fizzy she thought it might have been her BP.  No other symptoms.  She is concerned about her HR and is wondering what she should do.

## 2021-08-04 NOTE — Telephone Encounter (Signed)
Spoke to pt who stated that she has been sick the last couple of days and started taking Tylenol- Severe Cold and Flu w/ decongestant. Pt is also consuming caffeine. Verbalized to pt that caffeine needs to be cut back and she should take alternative medication w/o decongestant I.e Coricidin Cold & Flu or Mucinex w/o the D. Pt stated that she took her bp again around 10:30 am with bp at 140/87 hr- 106. Pt verbalized understanding. Will fwd to provider- FYI

## 2021-08-08 ENCOUNTER — Telehealth: Payer: Self-pay | Admitting: Cardiology

## 2021-08-08 NOTE — Telephone Encounter (Signed)
STAT if HR is under 50 or over 120 (normal HR is 60-100 beats per minute)  What is your heart rate? 108-109  Do you have a log of your heart rate readings (document readings)? no  Do you have any other symptoms? No have any energy. Bp 162/98; 146/85  Pt c/o Shortness Of Breath: STAT if SOB developed within the last 24 hours or pt is noticeably SOB on the phone  1. Are you currently SOB (can you hear that pt is SOB on the phone)? no  2. How long have you been experiencing SOB? Week  3. Are you SOB when sitting or when up moving around? Moving around  4. Are you currently experiencing any other symptoms? Patient is feeling extremely tired.

## 2021-08-08 NOTE — Telephone Encounter (Signed)
Pt called to say that she has been experiencing SOB and feeling weak for the last week. She states that she is no longer taking the Tylenol Cold and Flu. She states that she still feels weak and SOB. Pt denies having a fever at this time. Pt will call PCP and notify of sx. Please advise.

## 2021-08-09 NOTE — Progress Notes (Signed)
Referring Provider: [name], MD Primary Care Physician:  [name], MD Primary GI: Dr. Gala Romney   Chief Complaint  Patient presents with   Abdominal Pain    Better now     HPI:   Dorothy Lee is a delightful 80 y.o. female presenting today with a history constipation, GERD  perforated diverticulitis Jan 2019, undergoing elective colectomy in Aug 2019. She has a history of multiple polyps, with surveillance just completed in July 2022 with one tubular adenoma. No further surveillance due to age. She also underwent EGD July 2022 with mild Schatzki ring, s/p dilation. Small hiatal hernia. Normal duodenum.   Patient notes she has felt fatigued for a few days. She has noted dyspnea on exertion. No chest pain. HR was around 118 at rest. Feels better with shortness of breath today. Noted lower abdominal pain about 2-3 days ago when pressed up against an object but none now. Continues to note dysphagia and points to neck, stating this is where peanut butter and rice cause issues. Taking Protonix once daily. No overt GI bleeding.   Past Medical History:  Diagnosis Date   Allergic rhinitis    CAD (coronary artery disease)    DES RCA and LAD 02/2008, DES mid LAD 11/2012   Cancer [facility])    Diverticulitis    Diverticulosis    Essential hypertension    GERD (gastroesophageal reflux disease)    Glaucoma    History of breast cancer    Right - s/p XRT 1984, surgery, no chemo   Hyperlipidemia    Personal history of radiation therapy    Rotator cuff syndrome, left    Scoliosis    Urinary incontinence     Past Surgical History:  Procedure Laterality Date   [place]   right breast   COLONOSCOPY  2008   Dr. Oletta Lamas: diverticulosis, hemorrhoids, conscious sedation   COLONOSCOPY N/A 01/30/2016   Dr. Gala Romney: mild divertiuculosis in sigmoid colon. narrowing of the colon in association with the deverticular opening. 6  polyps removed, tubular adenoma. next TCS in 3 years   COLONOSCOPY WITH PROPOFOL N/A 04/17/2021   one 4 mm polyp at splenic flexure, otherwise normal. Tubular adenoma.   CORONARY ANGIOPLASTY WITH STENT PLACEMENT  02/2008   ESOPHAGOGASTRODUODENOSCOPY  2014   Dr. Fuller Plan: erosive gastritis, no h.pylori   ESOPHAGOGASTRODUODENOSCOPY N/A 01/30/2016   Dr. Gala Romney: normal esophagus s/p dilation, small hh.   ESOPHAGOGASTRODUODENOSCOPY (EGD) WITH PROPOFOL N/A 04/17/2021   mild Schatzki ring, s/p dilation. Small hiatal hernia. Normal duodenum.   LEFT HEART CATHETERIZATION WITH CORONARY ANGIOGRAM N/A 12/02/2012   Procedure: LEFT HEART CATHETERIZATION WITH CORONARY ANGIOGRAM;  Surgeon: Jettie Booze, MD;  Location[facility] C[facility] LAB;  Service: Cardiovascular;  Laterality: N/A;   MALONEY DILATION N/A 01/30/2016   Procedure: Venia Minks DILATION;  Surgeon: Daneil Dolin, MD;  [facility] ENDO [facility];  Service: Endoscopy;  Laterality: N/A;   MALONEY DILATION N/A 04/17/2021   Procedure: Venia Minks DILATION;  Surgeon: Daneil Dolin, MD;  [facility] ENDO [facility];  Service: Endoscopy;  Laterality: N/A;   NASAL SINUS SURGERY     PARTIAL COLECTOMY N/A 05/19/2018   Procedure: PARTIAL COLECTOMY;  Surgeon: Aviva Signs, MD;  Location: [facility] ORS;  Service: General;  Laterality: N/A;   PERCUTANEOUS CORONARY STENT INTERVENTION (PCI-S)  12/02/2012   Procedure: PERCUTANEOUS CORONARY STENT INTERVENTION (PCI-S);  Surgeon: Jettie Booze, MD;  Location: [place] CATH LAB;  Service: Cardiovascular;;  DES to the MID LAD   POLYPECTOMY  04/17/2021   Procedure: POLYPECTOMY;  Surgeon: Daneil Dolin, MD;  [facility] END[facility];  Service: Endoscopy;;    Current Outpatient Medications  Medication Sig Dispense Refill   amLODipine (NORVASC) 5 MG tablet Take 2.5 mg by mouth daily.     aspirin EC 81 MG tablet Take 81 mg by mouth daily.      isosorbide mononitrate (IMDUR) 60 MG 24 hr tablet TAKE 1 TABLET BY MOUTH DAILY (Patient taking  differently: Take 30 mg by mouth daily.) 90 tablet 3   latanoprost (XALATAN) 0.005 % ophthalmic solution Place 1 drop into both eyes at bedtime.      meclizine (ANTIVERT) 25 MG tablet Take 1 tablet (25 mg total) by mouth 3 (three) times daily as needed for dizziness. 30 tablet 0   meloxicam (MOBIC) 15 MG tablet Take 15 mg by mouth daily.     metoprolol succinate (TOPROL-XL) 25 MG 24 hr tablet TAKE 1 TABLET BY MOUTH DAILY 90 tablet 2   montelukast (SINGULAIR) 10 MG tablet Take 10 mg by mouth daily.     nitroGLYCERIN (NITROSTAT) 0.4 MG SL tablet Place 1 tablet (0.4 mg total) under the tongue every 5 (five) minutes x 3 doses as needed for chest pain. May repeat x3 25 tablet 3   pantoprazole (PROTONIX) 40 MG tablet TAKE 1 TABLET BY MOUTH DAILY (Patient taking differently: Take 40 mg by mouth daily.) 90 tablet 3   rosuvastatin (CRESTOR) 10 MG tablet Take 10 mg by mouth daily.     Wheat Dextrin (BENEFIBER PO) Take 1 Dose by mouth daily.     amoxicillin-clavulanate (AUGMENTIN) 875-125 MG tablet Take 1 tablet by mouth every 12 (twelve) hours. (Patient not taking: No sig reported) 28 tablet 0   ondansetron (ZOFRAN ODT) 4 MG disintegrating tablet 4mg  ODT q4 hours prn nausea/vomit (Patient not taking: No sig reported) 12 tablet 0   No current facility-administered medications for this visit.    Allergies as of 08/10/2021 - Review Complete 08/10/2021  Allergen Reaction Noted   Crestor [rosuvastatin]  08/11/2013   Lipitor [atorvastatin]  08/11/2013   Lisinopril Cough 02/16/2013   Simvastatin  08/11/2013    Family History  Problem Relation Age of Onset   Emphysema Father    Emphysema Brother    Heart disease Brother        x3 brothers   Lung cancer Brother    Throat cancer Brother        head/neck cancer   Heart attack Brother    Heart attack Sister    Heart attack Son        blood clot   Colon cancer Neg Hx     Social History   Socioeconomic History   Marital status: Married    Spouse  name: Not on file   Number of children: 1   Years of education: Not on file   Highest education level: Not on file  Occupational History   Occupation: Probation officer   Occupation: COSMETOLOGY    Employer: [employer]  Tobacco Use   Smoking status: Never   Smokeless tobacco: Never   Tobacco comments:    Widowed, lives alone but has sig other.   Vaping Use   Vaping Use: Never used  Substance and Sexual Activity   Alcohol use: Not Currently   Drug use: No   Sexual activity: Yes    Birth control/protection:  Post-menopausal  Other Topics Concern   Not on file  Social History Narrative   Widowed, husband died.    Lives alone but has significant other, married in 24-Dec-2015 to [place].    Had one son, but he passed away.    Retired as Probation officer - [employer] and part-time at [employer]   Son died in 2015/12/24 of AMI. Has two grandsons.       Social Determinants of Health   Financial Resource Strain: Not on file  Food Insecurity: Not on file  Transportation Needs: Not on file  Physical Activity: Not on file  Stress: Not on file  Social Connections: Not on file    Review of Systems: Gen: Denies fever, chills, anorexia. Denies fatigue, weakness, weight loss.  CV: Denies chest pain, palpitations, syncope, peripheral edema, and claudication. Resp: Denies dyspnea at rest, cough, wheezing, coughing up blood, and pleurisy. GI: see HPI Derm: Denies rash, itching, dry skin Psych: Denies depression, anxiety, memory loss, confusion. No homicidal or suicidal ideation.  Heme: Denies bruising, bleeding, and enlarged lymph nodes.  Physical Exam: BP (!) 147/83   Pulse 87   Temp (!) 97.1 F (36.2 C) (Temporal)   Ht 5\' 2"  (1.575 m)   Wt 133 lb 3.2 oz (60.4 kg)   BMI 24.36 kg/m  General:   Alert and oriented. No distress noted. Pleasant and cooperative.  Head:  Normocephalic and atraumatic. Eyes:  Conjuctiva clear without scleral icterus. Mouth:  mask in  place Abdomen:  +BS, soft, mild TTP lower and non-distended. No rebound or guarding. No HSM or masses noted. Msk:  Symmetrical without gross deformities. Normal posture. Extremities:  Without edema. Neurologic:  Alert and  oriented x4 Psych:  Alert and cooperative. Normal mood and affect.  When sitting up from exam, patient noted epigastric pain that extended up into jaw. BP remained in the 140s/70s, HR in the 70s. O2 sats 97% on room air.   ASSESSMENT/PLAN: SHALENE GALLEN is a delightful 80 y.o. female presenting today with a history constipation, GERD  perforated diverticulitis Jan 2019, undergoing elective colectomy in Aug 2019. She has a history of multiple polyps, with surveillance just completed in July 2022 with one tubular adenoma. No further surveillance due to age. She also underwent EGD July 2022 with mild Schatzki ring, s/p dilation. Small hiatal hernia. Normal duodenum.   Dysphagia: continues with certain solid foods. I suspect motility disorder. Will arrange BPE.  Dypsnea on exertion and fatigue: no lower extremity edema. Unclear etiology at this point. Denies chest pain. However, after sitting up from exam table, she noted a pain radiating from epigastric area up to jaw. Vitals stable. This quickly subsided. With her constellation of symptoms and known cardiac history, I have advised ED evaluation. She has declined this multiple times. I am ordering CBC, CMP, lipase now. She will call 911 if any future symptoms. Negative COVID at home.   Continue PPI daily.  Further recommendations to follow.  Annitta Needs, PhD, ANP-BC [facility]

## 2021-08-10 ENCOUNTER — Ambulatory Visit (INDEPENDENT_AMBULATORY_CARE_PROVIDER_SITE_OTHER): Payer: Medicare Other | Admitting: Gastroenterology

## 2021-08-10 ENCOUNTER — Encounter: Payer: Self-pay | Admitting: Gastroenterology

## 2021-08-10 ENCOUNTER — Other Ambulatory Visit: Payer: Self-pay

## 2021-08-10 VITALS — BP 140/70 | HR 83 | Temp 97.1°F | Ht 62.0 in | Wt 133.2 lb

## 2021-08-10 DIAGNOSIS — R1310 Dysphagia, unspecified: Secondary | ICD-10-CM | POA: Diagnosis not present

## 2021-08-10 DIAGNOSIS — I25119 Atherosclerotic heart disease of native coronary artery with unspecified angina pectoris: Secondary | ICD-10-CM | POA: Diagnosis not present

## 2021-08-10 DIAGNOSIS — R531 Weakness: Secondary | ICD-10-CM | POA: Diagnosis not present

## 2021-08-10 DIAGNOSIS — R1013 Epigastric pain: Secondary | ICD-10-CM | POA: Diagnosis not present

## 2021-08-10 LAB — COMPLETE METABOLIC PANEL WITH GFR
AG Ratio: 1.8 (calc) (ref 1.0–2.5)
ALT: 16 U/L (ref 6–29)
AST: 20 U/L (ref 10–35)
Albumin: 4.2 g/dL (ref 3.6–5.1)
Alkaline phosphatase (APISO): 68 U/L (ref 37–153)
BUN/Creatinine Ratio: 19 (calc) (ref 6–22)
BUN: 19 mg/dL (ref 7–25)
CO2: 27 mmol/L (ref 20–32)
Calcium: 9.1 mg/dL (ref 8.6–10.4)
Chloride: 104 mmol/L (ref 98–110)
Creat: 1 mg/dL — ABNORMAL HIGH (ref 0.60–0.95)
Globulin: 2.4 g/dL (calc) (ref 1.9–3.7)
Glucose, Bld: 86 mg/dL (ref 65–99)
Potassium: 4.1 mmol/L (ref 3.5–5.3)
Sodium: 139 mmol/L (ref 135–146)
Total Bilirubin: 0.8 mg/dL (ref 0.2–1.2)
Total Protein: 6.6 g/dL (ref 6.1–8.1)
eGFR: 57 mL/min/{1.73_m2} — ABNORMAL LOW (ref >=60)

## 2021-08-10 LAB — CBC WITH DIFFERENTIAL/PLATELET
Absolute Monocytes: 710 cells/uL (ref 200–950)
Basophils Absolute: 47 cells/uL (ref 0–200)
Basophils Relative: 0.6 %
Eosinophils Absolute: 187 cells/uL (ref 15–500)
Eosinophils Relative: 2.4 %
HCT: 42.1 % (ref 35.0–45.0)
Hemoglobin: 14 g/dL (ref 11.7–15.5)
Lymphs Abs: 1833 cells/uL (ref 850–3900)
MCH: 30.2 pg (ref 27.0–33.0)
MCHC: 33.3 g/dL (ref 32.0–36.0)
MCV: 90.7 fL (ref 80.0–100.0)
MPV: 9.7 fL (ref 7.5–12.5)
Monocytes Relative: 9.1 %
Neutro Abs: 5023 cells/uL (ref 1500–7800)
Neutrophils Relative %: 64.4 %
Platelets: 230 10*3/uL (ref 140–400)
RBC: 4.64 10*6/uL (ref 3.80–5.10)
RDW: 11.9 % (ref 11.0–15.0)
Total Lymphocyte: 23.5 %
WBC: 7.8 10*3/uL (ref 3.8–10.8)

## 2021-08-10 LAB — LIPASE: Lipase: 33 U/L (ref 7–60)

## 2021-08-10 NOTE — Patient Instructions (Signed)
I have ordered labs.  I am recommending emergency room visit due to your symptoms. I am concerned about the fatigue, shortness of breath, and sometimes women can have different symptoms for heart issues than men can. If you have any further pain or worsening symptoms, please call 911.   I have arranged a swallow xray to see how your esophagus moves as you are still having issues with swallowing.  Further recommendations to follow!  I enjoyed seeing you again today! As you know, I value our relationship and want to provide genuine, compassionate, and quality care. I welcome your feedback. If you receive a survey regarding your visit,  I greatly appreciate you taking time to fill this out. See you next time!  Annitta Needs, PhD, ANP-BC [facility]

## 2021-08-14 ENCOUNTER — Telehealth: Payer: Self-pay | Admitting: Internal Medicine

## 2021-08-14 NOTE — Telephone Encounter (Signed)
See result note.  

## 2021-08-14 NOTE — Telephone Encounter (Signed)
Pt returning call. [phone]

## 2021-08-14 NOTE — H&P (View-Only) (Signed)
Cardiology Office Note  Date: 08/15/2021   ID: Dorothy Lee, DOB [birth year], MRN [id number]  PCP:  [name], MD  Cardiologist:  [name], MD Electrophysiologist:  None   Chief Complaint  Patient presents with   Cardiac follow-up    History of Present Illness: Dorothy Lee is an 80 y.o. female last seen in September.  She is referred back to the office with recent reported fatigue and shortness of breath.  She tells me that over the last month she has had episodes of fatigue and breathlessness with typical ADLs, also discomfort in her epigastric region, most recently radiated up to the right arm and neck.  In the last few days she has not had progressive symptoms.  She reports compliance with her medications as noted below, uses nitroglycerin intermittently.  I note that she recently saw Ms. Cyndi Bender NP with gastroenterology on November 3 also reporting the above symptoms, I reviewed the note.  Lab work reviewed below was unremarkable.  Lexiscan Myoview in March of this year was low risk demonstrating a mild ischemic territory with mixed component of scar in the mid to basal inferoseptal wall.  We have been managing her medically.  Last PCI was DES to the LAD in 2014, prior to that DES to the RCA and LAD in 2009.  Personally reviewed her ECG today which shows sinus rhythm with leftward axis rule out old inferoposterior infarct pattern.  Past Medical History:  Diagnosis Date   Allergic rhinitis    CAD (coronary artery disease)    DES RCA and LAD 02/2008, DES mid LAD 11/2012   Cancer [facility])    Diverticulitis    Diverticulosis    Essential hypertension    GERD (gastroesophageal reflux disease)    Glaucoma    History of breast cancer    Right - s/p XRT 1984, surgery, no chemo   Hyperlipidemia    Personal history of radiation therapy    Rotator cuff syndrome, left    Scoliosis    Urinary incontinence     Past Surgical History:  Procedure Laterality Date    [place]   right breast   COLONOSCOPY  2008   Dr. Oletta Lamas: diverticulosis, hemorrhoids, conscious sedation   COLONOSCOPY N/A 01/30/2016   Dr. Gala Romney: mild divertiuculosis in sigmoid colon. narrowing of the colon in association with the deverticular opening. 6 polyps removed, tubular adenoma. next TCS in 3 years   COLONOSCOPY WITH PROPOFOL N/A 04/17/2021   one 4 mm polyp at splenic flexure, otherwise normal. Tubular adenoma.   CORONARY ANGIOPLASTY WITH STENT PLACEMENT  02/2008   ESOPHAGOGASTRODUODENOSCOPY  2014   Dr. Fuller Plan: erosive gastritis, no h.pylori   ESOPHAGOGASTRODUODENOSCOPY N/A 01/30/2016   Dr. Gala Romney: normal esophagus s/p dilation, small hh.   ESOPHAGOGASTRODUODENOSCOPY (EGD) WITH PROPOFOL N/A 04/17/2021   mild Schatzki ring, s/p dilation. Small hiatal hernia. Normal duodenum.   LEFT HEART CATHETERIZATION WITH CORONARY ANGIOGRAM N/A 12/02/2012   Procedure: LEFT HEART CATHETERIZATION WITH CORONARY ANGIOGRAM;  Surgeon: Jettie Booze, MD;  Location: [facility] CATH LAB;  Service: Cardiovascular;  Laterality: N/A;   MALONEY DILATION N/A 01/30/2016   Procedure: Venia Minks DILATION;  Surgeon: Daneil Dolin, MD;  [facility] ENDO [facility];  Service: Endoscopy;  Laterality: N/A;   MALONEY DILATION N/A 04/17/2021   Procedure: Venia Minks DILATION;  Surgeon: Daneil Dolin, MD;  [facility] END[facility];  Service: Endoscopy;  Laterality: N/A;  NASAL SINUS SURGERY     PARTIAL COLECTOMY N/A 05/19/2018   Procedure: PARTIAL COLECTOMY;  Surgeon: Aviva Signs, MD;  Location: [facility] ORS;  Service: General;  Laterality: N/A;   PERCUTANEOUS CORONARY STENT INTERVENTION (PCI-S)  12/02/2012   Procedure: PERCUTANEOUS CORONARY STENT INTERVENTION (PCI-S);  Surgeon: Jettie Booze, MD;  Location: [facility] CATH LAB;  Service: Cardiovascular;;  DES to the MID LAD   POLYPECTOMY  04/17/2021   Procedure: POLYPECTOMY;  Surgeon: Daneil Dolin, MD;  [facility] ENDO  [facility];  Service: Endoscopy;;    Current Outpatient Medications  Medication Sig Dispense Refill   amLODipine (NORVASC) 5 MG tablet Take 2.5 mg by mouth daily.     aspirin EC 81 MG tablet Take 81 mg by mouth daily.      isosorbide mononitrate (IMDUR) 60 MG 24 hr tablet TAKE 1 TABLET BY MOUTH DAILY (Patient taking differently: Take 30 mg by mouth daily.) 90 tablet 3   latanoprost (XALATAN) 0.005 % ophthalmic solution Place 1 drop into both eyes at bedtime.      meclizine (ANTIVERT) 25 MG tablet Take 1 tablet (25 mg total) by mouth 3 (three) times daily as needed for dizziness. 30 tablet 0   meloxicam (MOBIC) 15 MG tablet Take 15 mg by mouth daily.     metoprolol succinate (TOPROL-XL) 25 MG 24 hr tablet TAKE 1 TABLET BY MOUTH DAILY 90 tablet 2   montelukast (SINGULAIR) 10 MG tablet Take 10 mg by mouth daily.     pantoprazole (PROTONIX) 40 MG tablet TAKE 1 TABLET BY MOUTH DAILY (Patient taking differently: Take 40 mg by mouth daily.) 90 tablet 3   rosuvastatin (CRESTOR) 10 MG tablet Take 10 mg by mouth daily.     Wheat Dextrin (BENEFIBER PO) Take 1 Dose by mouth daily.     nitroGLYCERIN (NITROSTAT) 0.4 MG SL tablet Place 1 tablet (0.4 mg total) under the tongue every 5 (five) minutes x 3 doses as needed for chest pain. May repeat x3 25 tablet 3   No current facility-administered medications for this visit.   Allergies:  Crestor [rosuvastatin], Lipitor [atorvastatin], Lisinopril, and Simvastatin   Social History: The patient  reports that she has never smoked. She has never used smokeless tobacco. She reports that she does not currently use alcohol. She reports that she does not use drugs.   Family History: The patient's family history includes Emphysema in her brother and father; Heart attack in her brother, sister, and son; Heart disease in her brother; Lung cancer in her brother; Throat cancer in her brother.   ROS: No palpitations or syncope.  Intermittent mild ankle edema.  Physical  Exam: VS:  BP 130/82 (BP Location: Left Arm, Patient Position: Sitting, Cuff Size: Normal)   Pulse 80   Ht 5' (1.524 m)   Wt 133 lb 12.8 oz (60.7 kg)   SpO2 96%   BMI 26.13 kg/m , BMI Body mass index is 26.13 kg/m.  Wt Readings from Last 3 Encounters:  08/15/21 133 lb 12.8 oz (60.7 kg)  08/10/21 133 lb 3.2 oz (60.4 kg)  06/09/21 137 lb 3.2 oz (62.2 kg)    General: Pleasant elderly woman, appears comfortable at rest. HEENT: Conjunctiva and lids normal, wearing a mask. Neck: Supple, no elevated JVP or carotid bruits, no thyromegaly. Lungs: Clear to auscultation, nonlabored breathing at rest. Cardiac: Regular rate and rhythm, no S3 or significant systolic murmur, no pericardial rub. Abdomen: Soft, nontender, bowel sounds present. Extremities: No pitting edema, distal pulses 2+.  Skin: Warm and dry. Musculoskeletal: No kyphosis. Neuropsychiatric: Alert and oriented x3, affect grossly appropriate.  ECG:  An ECG dated 05/06/2021 was personally reviewed today and demonstrated:  Sinus rhythm with right bundle branch block and left anterior fascicular block.  Recent Labwork: 08/10/2021: ALT 16; AST 20; BUN 19; Creat 1.00; Hemoglobin 14.0; Platelets 230; Potassium 4.1; Sodium 139     Component Value Date/Time   CHOL 181 05/30/2020 1232   TRIG 115 05/30/2020 1232   TRIG 119 05/16/2010 0000   HDL 59 05/30/2020 1232   CHOLHDL 3.1 05/30/2020 1232   VLDL 23 05/30/2020 1232   LDLCALC 99 05/30/2020 1232   LDLCALC 99 05/05/2019 1124   LDLDIRECT 144.4 11/09/2013 0934    Other Studies Reviewed Today:  Lexiscan Myoview 12/09/2020: No diagnostic ST segment changes to indicate ischemia. Small, mild intensity, mid to basal inferoseptal defect that is partially reversible suggesting mild ischemic territory with possible component of scar. This is a low risk study. Nuclear stress EF: 86%.  Assessment and Plan:  1.  Dyspnea on exertion, fatigue, and accelerating angina in an 80 year old woman  with history of CAD status post DES to the LAD and RCA in 2009 with more recent DES intervention to the LAD in 2014.  Lexiscan Myoview in March was low risk showing mild ischemic territory with associated scar in the mid to basal inferoseptal wall.  She reports compliance with medical therapy including aspirin, Imdur, Norvasc, Toprol-XL, and Crestor.  ECG reviewed.  We discussed the risks and benefits of a diagnostic cardiac catheterization to evaluate coronary anatomy and assess for revascularization options.  She is in agreement to proceed.  This will be scheduled for this week.  2.  Mixed hyperlipidemia, tolerating Crestor.  Medication Adjustments/Labs and Tests Ordered: Current medicines are reviewed at length with the patient today.  Concerns regarding medicines are outlined above.   Tests Ordered: Orders Placed This Encounter  Procedures   EKG 12-Lead     Medication Changes: No orders of the defined types were placed in this encounter.   Disposition:  Follow up  after procedure.  Signed, Satira Sark, MD, [facility] 08/15/2021 9:34 AM    [facility] [place], [place], [place] [address] Phone: [phone]; Fax: [phone]

## 2021-08-14 NOTE — Progress Notes (Signed)
Cardiology Office Note  Date: 08/15/2021   ID: Dorothy Lee, DOB 1940/11/26, MRN [id number]  PCP:  [name], MD  Cardiologist:  [name], MD Electrophysiologist:  None   Chief Complaint  Patient presents with   Cardiac follow-up    History of Present Illness: Dorothy Lee is an 80 y.o. female last seen in September.  She is referred back to the office with recent reported fatigue and shortness of breath.  She tells me that over the last month she has had episodes of fatigue and breathlessness with typical ADLs, also discomfort in her epigastric region, most recently radiated up to the right arm and neck.  In the last few days she has not had progressive symptoms.  She reports compliance with her medications as noted below, uses nitroglycerin intermittently.  I note that she recently saw Ms. Dorothy Bender NP with gastroenterology on November 3 also reporting the above symptoms, I reviewed the note.  Lab work reviewed below was unremarkable.  Lexiscan Myoview in March of this year was low risk demonstrating a mild ischemic territory with mixed component of scar in the mid to basal inferoseptal wall.  We have been managing her medically.  Last PCI was DES to the LAD in 2014, prior to that DES to the RCA and LAD in 2009.  Personally reviewed her ECG today which shows sinus rhythm with leftward axis rule out old inferoposterior infarct pattern.  Past Medical History:  Diagnosis Date   Allergic rhinitis    CAD (coronary artery disease)    DES RCA and LAD 02/2008, DES mid LAD 11/2012   Cancer [facility])    Diverticulitis    Diverticulosis    Essential hypertension    GERD (gastroesophageal reflux disease)    Glaucoma    History of breast cancer    Right - s/p XRT 1984, surgery, no chemo   Hyperlipidemia    Personal history of radiation therapy    Rotator cuff syndrome, left    Scoliosis    Urinary incontinence     Past Surgical History:  Procedure Laterality Date    [place]   right breast   COLONOSCOPY  2008   Dr. Oletta Lamas: diverticulosis, hemorrhoids, conscious sedation   COLONOSCOPY N/A 01/30/2016   Dr. Gala Romney: mild divertiuculosis in sigmoid colon. narrowing of the colon in association with the deverticular opening. 6 polyps removed, tubular adenoma. next TCS in 3 years   COLONOSCOPY WITH PROPOFOL N/A 04/17/2021   one 4 mm polyp at splenic flexure, otherwise normal. Tubular adenoma.   CORONARY ANGIOPLASTY WITH STENT PLACEMENT  02/2008   ESOPHAGOGASTRODUODENOSCOPY  2014   Dr. Fuller Plan: erosive gastritis, no h.pylori   ESOPHAGOGASTRODUODENOSCOPY N/A 01/30/2016   Dr. Gala Romney: normal esophagus s/p dilation, small hh.   ESOPHAGOGASTRODUODENOSCOPY (EGD) WITH PROPOFOL N/A 04/17/2021   mild Schatzki ring, s/p dilation. Small hiatal hernia. Normal duodenum.   LEFT HEART CATHETERIZATION WITH CORONARY ANGIOGRAM N/A 12/02/2012   Procedure: LEFT HEART CATHETERIZATION WITH CORONARY ANGIOGRAM;  Surgeon: Jettie Booze, MD;  Location: [facility] CATH LAB;  Service: Cardiovascular;  Laterality: N/A;   MALONEY DILATION N/A 01/30/2016   Procedure: Venia Minks DILATION;  Surgeon: Daneil Dolin, MD;  Location: [facility] ENDO SUITE;  Service: Endoscopy;  Laterality: N/A;   MALONEY DILATION N/A 04/17/2021   Procedure: Venia Minks DILATION;  Surgeon: Daneil Dolin, MD;  Location: [facility] ENDO SUITE;  Service: Endoscopy;  Laterality: N/A;  NASAL SINUS SURGERY     PARTIAL COLECTOMY N/A 05/19/2018   Procedure: PARTIAL COLECTOMY;  Surgeon: Aviva Signs, MD;  Location: [facility] ORS;  Service: General;  Laterality: N/A;   PERCUTANEOUS CORONARY STENT INTERVENTION (PCI-S)  12/02/2012   Procedure: PERCUTANEOUS CORONARY STENT INTERVENTION (PCI-S);  Surgeon: Jettie Booze, MD;  Location: [facility] CATH LAB;  Service: Cardiovascular;;  DES to the MID LAD   POLYPECTOMY  04/17/2021   Procedure: POLYPECTOMY;  Surgeon: Daneil Dolin, MD;  Location: [facility] ENDO  SUITE;  Service: Endoscopy;;    Current Outpatient Medications  Medication Sig Dispense Refill   amLODipine (NORVASC) 5 MG tablet Take 2.5 mg by mouth daily.     aspirin EC 81 MG tablet Take 81 mg by mouth daily.      isosorbide mononitrate (IMDUR) 60 MG 24 hr tablet TAKE 1 TABLET BY MOUTH DAILY (Patient taking differently: Take 30 mg by mouth daily.) 90 tablet 3   latanoprost (XALATAN) 0.005 % ophthalmic solution Place 1 drop into both eyes at bedtime.      meclizine (ANTIVERT) 25 MG tablet Take 1 tablet (25 mg total) by mouth 3 (three) times daily as needed for dizziness. 30 tablet 0   meloxicam (MOBIC) 15 MG tablet Take 15 mg by mouth daily.     metoprolol succinate (TOPROL-XL) 25 MG 24 hr tablet TAKE 1 TABLET BY MOUTH DAILY 90 tablet 2   montelukast (SINGULAIR) 10 MG tablet Take 10 mg by mouth daily.     pantoprazole (PROTONIX) 40 MG tablet TAKE 1 TABLET BY MOUTH DAILY (Patient taking differently: Take 40 mg by mouth daily.) 90 tablet 3   rosuvastatin (CRESTOR) 10 MG tablet Take 10 mg by mouth daily.     Wheat Dextrin (BENEFIBER PO) Take 1 Dose by mouth daily.     nitroGLYCERIN (NITROSTAT) 0.4 MG SL tablet Place 1 tablet (0.4 mg total) under the tongue every 5 (five) minutes x 3 doses as needed for chest pain. May repeat x3 25 tablet 3   No current facility-administered medications for this visit.   Allergies:  Crestor [rosuvastatin], Lipitor [atorvastatin], Lisinopril, and Simvastatin   Social History: The patient  reports that she has never smoked. She has never used smokeless tobacco. She reports that she does not currently use alcohol. She reports that she does not use drugs.   Family History: The patient's family history includes Emphysema in her brother and father; Heart attack in her brother, sister, and son; Heart disease in her brother; Lung cancer in her brother; Throat cancer in her brother.   ROS: No palpitations or syncope.  Intermittent mild ankle edema.  Physical  Exam: VS:  BP 130/82 (BP Location: Left Arm, Patient Position: Sitting, Cuff Size: Normal)   Pulse 80   Ht 5' (1.524 m)   Wt 133 lb 12.8 oz (60.7 kg)   SpO2 96%   BMI 26.13 kg/m , BMI Body mass index is 26.13 kg/m.  Wt Readings from Last 3 Encounters:  08/15/21 133 lb 12.8 oz (60.7 kg)  08/10/21 133 lb 3.2 oz (60.4 kg)  06/09/21 137 lb 3.2 oz (62.2 kg)    General: Pleasant elderly woman, appears comfortable at rest. HEENT: Conjunctiva and lids normal, wearing a mask. Neck: Supple, no elevated JVP or carotid bruits, no thyromegaly. Lungs: Clear to auscultation, nonlabored breathing at rest. Cardiac: Regular rate and rhythm, no S3 or significant systolic murmur, no pericardial rub. Abdomen: Soft, nontender, bowel sounds present. Extremities: No pitting edema, distal pulses 2+.  Skin: Warm and dry. Musculoskeletal: No kyphosis. Neuropsychiatric: Alert and oriented x3, affect grossly appropriate.  ECG:  An ECG dated 05/06/2021 was personally reviewed today and demonstrated:  Sinus rhythm with right bundle branch block and left anterior fascicular block.  Recent Labwork: 08/10/2021: ALT 16; AST 20; BUN 19; Creat 1.00; Hemoglobin 14.0; Platelets 230; Potassium 4.1; Sodium 139     Component Value Date/Time   CHOL 181 05/30/2020 1232   TRIG 115 05/30/2020 1232   TRIG 119 05/16/2010 0000   HDL 59 05/30/2020 1232   CHOLHDL 3.1 05/30/2020 1232   VLDL 23 05/30/2020 1232   LDLCALC 99 05/30/2020 1232   LDLCALC 99 05/05/2019 1124   LDLDIRECT 144.4 11/09/2013 0934    Other Studies Reviewed Today:  Lexiscan Myoview 12/09/2020: No diagnostic ST segment changes to indicate ischemia. Small, mild intensity, mid to basal inferoseptal defect that is partially reversible suggesting mild ischemic territory with possible component of scar. This is a low risk study. Nuclear stress EF: 86%.  Assessment and Plan:  1.  Dyspnea on exertion, fatigue, and accelerating angina in an 80 year old woman  with history of CAD status post DES to the LAD and RCA in 2009 with more recent DES intervention to the LAD in 2014.  Lexiscan Myoview in March was low risk showing mild ischemic territory with associated scar in the mid to basal inferoseptal wall.  She reports compliance with medical therapy including aspirin, Imdur, Norvasc, Toprol-XL, and Crestor.  ECG reviewed.  We discussed the risks and benefits of a diagnostic cardiac catheterization to evaluate coronary anatomy and assess for revascularization options.  She is in agreement to proceed.  This will be scheduled for this week.  2.  Mixed hyperlipidemia, tolerating Crestor.  Medication Adjustments/Labs and Tests Ordered: Current medicines are reviewed at length with the patient today.  Concerns regarding medicines are outlined above.   Tests Ordered: Orders Placed This Encounter  Procedures   EKG 12-Lead     Medication Changes: No orders of the defined types were placed in this encounter.   Disposition:  Follow up  after procedure.  Signed, Satira Sark, MD, [facility] 08/15/2021 9:34 AM    [facility] [place], [place], [place] [address] Phone: [phone]; Fax: [phone]

## 2021-08-15 ENCOUNTER — Other Ambulatory Visit: Payer: Self-pay | Admitting: Cardiology

## 2021-08-15 ENCOUNTER — Ambulatory Visit (INDEPENDENT_AMBULATORY_CARE_PROVIDER_SITE_OTHER): Payer: Medicare Other | Admitting: Cardiology

## 2021-08-15 ENCOUNTER — Telehealth: Payer: Self-pay | Admitting: Cardiology

## 2021-08-15 ENCOUNTER — Encounter: Payer: Self-pay | Admitting: Cardiology

## 2021-08-15 VITALS — BP 130/82 | HR 80 | Ht 60.0 in | Wt 133.8 lb

## 2021-08-15 DIAGNOSIS — I25119 Atherosclerotic heart disease of native coronary artery with unspecified angina pectoris: Secondary | ICD-10-CM

## 2021-08-15 DIAGNOSIS — I200 Unstable angina: Secondary | ICD-10-CM

## 2021-08-15 DIAGNOSIS — E782 Mixed hyperlipidemia: Secondary | ICD-10-CM

## 2021-08-15 MED ORDER — SODIUM CHLORIDE 0.9% FLUSH: 3.0000 mL | Freq: Two times a day (BID) | INTRAVENOUS | Status: DC

## 2021-08-15 NOTE — Patient Instructions (Signed)
Medication Instructions:  Your physician recommends that you continue on your current medications as directed. Please refer to the Current Medication list given to you today.  Labwork: none  Testing/Procedures: Your physician has requested that you have a cardiac catheterization. Cardiac catheterization is used to diagnose and/or treat various heart conditions. Doctors may recommend this procedure for a number of different reasons. The most common reason is to evaluate chest pain. Chest pain can be a symptom of coronary artery disease (CAD), and cardiac catheterization can show whether plaque is narrowing or blocking your heart's arteries. This procedure is also used to evaluate the valves, as well as measure the blood flow and oxygen levels in different parts of your heart. For further information please visit [email or web address]. Please follow instruction sheet, as given.  Follow-Up: Your physician recommends that you schedule a follow-up appointment in: 1 month  Any Other Special Instructions Will Be Listed Below (If Applicable).  If you need a refill on your cardiac medications before your next appointment, please call your pharmacy.   [place] [address] Dept: [phone] Loc: [place]  08/15/2021  You are scheduled for a Cardiac Catheterization on Thursday, November 10 with Dr. Shelva Majestic.  1. Please arrive at the [facility] (Main Entrance A) at [facility]: [address] [place], [place] [address] at 7:00 AM (This time is two hours before your procedure to ensure your preparation). Free valet parking service is available.   Special note: Every effort is made to have your procedure done on time. Please understand that emergencies sometimes delay scheduled procedures.  2. Diet: Do not eat solid foods after midnight.  The  patient may have clear liquids until 5am upon the day of the procedure.  3. Labs: done on 08/10/2021-available in chart  4. Medication instructions in preparation for your procedure:   Contrast Allergy: No  On the morning of your procedure, take your Aspirin 81 mg and any morning medicines NOT listed above.  You may use sips of water.  5. Plan for one night stay--bring personal belongings. 6. Bring a current list of your medications and current insurance cards. 7. You MUST have a responsible person to drive you home. 8. Someone MUST be with you the first 24 hours after you arrive home or your discharge will be delayed. 9. Please wear clothes that are easy to get on and off and wear slip-on shoes.  Thank you for allowing [place] to care for you!   -- [facility]

## 2021-08-15 NOTE — Telephone Encounter (Signed)
PERCERT:  Left heart cath dx: accelerating angina (Thursday, 08/17/21 @9 :00 am with Dr. Claiborne Billings)

## 2021-08-16 ENCOUNTER — Telehealth: Payer: Self-pay | Admitting: *Deleted

## 2021-08-16 NOTE — Telephone Encounter (Signed)
Cardiac catheterization scheduled at [facility] for: Thursday August 17, 2021 [facility] Main Entrance A [facility]) at: 7 AM   No solid food after midnight prior to cath, clear liquids until 5 AM day of procedure.  Usual morning medications can be taken pre-cath with sips of water including aspirin 81 mg.    Confirmed patient has responsible adult to drive home post procedure and be with patient first 24 hours after arriving home.  [facility] does allow one visitor to accompany you and wait in the hospital waiting room while you are there for your procedure. You and your visitor will be asked to wear a mask once you enter the hospital.   Patient reports does not currently have any new symptoms concerning for COVID-19 and no household members with COVID-19 like illness.         Reviewed procedure/mask/visitor instructions with patient.

## 2021-08-17 ENCOUNTER — Ambulatory Visit (HOSPITAL_COMMUNITY)
Admission: RE | Disposition: A | Discharge status: Home / Self Care | Payer: Self-pay | Source: Home / Self Care | Attending: Cardiovascular Disease

## 2021-08-17 ENCOUNTER — Other Ambulatory Visit: Payer: Self-pay

## 2021-08-17 ENCOUNTER — Ambulatory Visit (HOSPITAL_COMMUNITY)
Admission: RE | Admit: 2021-08-17 | Discharge: 2021-08-18 | Disposition: A | Discharge status: Home / Self Care | Payer: Medicare Other | Attending: Cardiovascular Disease | Admitting: Cardiovascular Disease

## 2021-08-17 DIAGNOSIS — Z79899 Other long term (current) drug therapy: Secondary | ICD-10-CM | POA: Insufficient documentation

## 2021-08-17 DIAGNOSIS — Z7982 Long term (current) use of aspirin: Secondary | ICD-10-CM | POA: Diagnosis not present

## 2021-08-17 DIAGNOSIS — Z9861 Coronary angioplasty status: Secondary | ICD-10-CM

## 2021-08-17 DIAGNOSIS — I25110 Atherosclerotic heart disease of native coronary artery with unstable angina pectoris: Secondary | ICD-10-CM | POA: Diagnosis not present

## 2021-08-17 DIAGNOSIS — I25118 Atherosclerotic heart disease of native coronary artery with other forms of angina pectoris: Secondary | ICD-10-CM | POA: Insufficient documentation

## 2021-08-17 DIAGNOSIS — I2510 Atherosclerotic heart disease of native coronary artery without angina pectoris: Secondary | ICD-10-CM | POA: Diagnosis present

## 2021-08-17 DIAGNOSIS — R5383 Other fatigue: Secondary | ICD-10-CM | POA: Diagnosis not present

## 2021-08-17 DIAGNOSIS — Z955 Presence of coronary angioplasty implant and graft: Secondary | ICD-10-CM | POA: Diagnosis not present

## 2021-08-17 DIAGNOSIS — E785 Hyperlipidemia, unspecified: Secondary | ICD-10-CM | POA: Diagnosis present

## 2021-08-17 DIAGNOSIS — I10 Essential (primary) hypertension: Secondary | ICD-10-CM | POA: Insufficient documentation

## 2021-08-17 DIAGNOSIS — E782 Mixed hyperlipidemia: Secondary | ICD-10-CM | POA: Insufficient documentation

## 2021-08-17 DIAGNOSIS — R0609 Other forms of dyspnea: Secondary | ICD-10-CM | POA: Diagnosis not present

## 2021-08-17 DIAGNOSIS — I25119 Atherosclerotic heart disease of native coronary artery with unspecified angina pectoris: Secondary | ICD-10-CM

## 2021-08-17 DIAGNOSIS — I200 Unstable angina: Secondary | ICD-10-CM

## 2021-08-17 HISTORY — PX: CORONARY BALLOON ANGIOPLASTY: CATH118233

## 2021-08-17 HISTORY — PX: LEFT HEART CATH AND CORONARY ANGIOGRAPHY: CATH118249

## 2021-08-17 LAB — POCT ACTIVATED CLOTTING TIME
Activated Clotting Time: 283 seconds
Activated Clotting Time: 283 seconds
Activated Clotting Time: 335 seconds
Activated Clotting Time: 208 seconds
Activated Clotting Time: 184 seconds
Activated Clotting Time: 179 seconds

## 2021-08-17 SURGERY — LEFT HEART CATH AND CORONARY ANGIOGRAPHY
Anesthesia: LOCAL

## 2021-08-17 MED ORDER — SODIUM CHLORIDE 0.9 % IV SOLN: INTRAVENOUS | Status: AC

## 2021-08-17 MED ORDER — FENTANYL CITRATE (PF) 100 MCG/2ML IJ SOLN
INTRAMUSCULAR | Status: DC | PRN
  Administered 2021-08-17 (×3): 25 ug via INTRAVENOUS

## 2021-08-17 MED ORDER — IOHEXOL 350 MG/ML SOLN
INTRAVENOUS | Status: DC | PRN
  Administered 2021-08-17: 195 mL

## 2021-08-17 MED ORDER — NITROGLYCERIN 1 MG/10 ML FOR IR/CATH LAB
INTRA_ARTERIAL | Status: AC
  Filled 2021-08-17: qty 10

## 2021-08-17 MED ORDER — SODIUM CHLORIDE 0.9 % WEIGHT BASED INFUSION
3.0000 mL/kg/h | INTRAVENOUS | Status: DC
  Administered 2021-08-17: 3 mL/kg/h via INTRAVENOUS

## 2021-08-17 MED ORDER — ROSUVASTATIN CALCIUM 5 MG PO TABS
10.0000 mg | ORAL_TABLET | Freq: Every day | ORAL | Status: DC
  Administered 2021-08-18: 10 mg via ORAL
  Filled 2021-08-17: qty 2

## 2021-08-17 MED ORDER — SODIUM CHLORIDE 0.9% FLUSH: 3.0000 mL | INTRAVENOUS | Status: DC | PRN

## 2021-08-17 MED ORDER — LIDOCAINE HCL (PF) 1 % IJ SOLN
INTRAMUSCULAR | Status: AC
  Filled 2021-08-17: qty 30

## 2021-08-17 MED ORDER — CLOPIDOGREL BISULFATE 300 MG PO TABS
ORAL_TABLET | ORAL | Status: DC | PRN
  Administered 2021-08-17: 600 mg via ORAL

## 2021-08-17 MED ORDER — NITROGLYCERIN 1 MG/10 ML FOR IR/CATH LAB
INTRA_ARTERIAL | Status: DC | PRN
  Administered 2021-08-17 (×4): 200 ug via INTRACORONARY

## 2021-08-17 MED ORDER — MONTELUKAST SODIUM 10 MG PO TABS
10.0000 mg | ORAL_TABLET | Freq: Every day | ORAL | Status: DC
  Administered 2021-08-18: 10 mg via ORAL
  Filled 2021-08-17: qty 1

## 2021-08-17 MED ORDER — ASPIRIN EC 81 MG PO TBEC
81.0000 mg | DELAYED_RELEASE_TABLET | Freq: Every day | ORAL | Status: DC
  Administered 2021-08-18: 81 mg via ORAL
  Filled 2021-08-17: qty 1

## 2021-08-17 MED ORDER — CLOPIDOGREL BISULFATE 75 MG PO TABS
75.0000 mg | ORAL_TABLET | Freq: Every day | ORAL | Status: DC
  Administered 2021-08-18: 75 mg via ORAL
  Filled 2021-08-17: qty 1

## 2021-08-17 MED ORDER — ACETAMINOPHEN 325 MG PO TABS
650.0000 mg | ORAL_TABLET | ORAL | Status: DC | PRN
  Administered 2021-08-17: 650 mg via ORAL
  Filled 2021-08-17: qty 2

## 2021-08-17 MED ORDER — SODIUM CHLORIDE 0.9 % IV SOLN: 250.0000 mL | INTRAVENOUS | Status: DC | PRN

## 2021-08-17 MED ORDER — MIDAZOLAM HCL 2 MG/2ML IJ SOLN
INTRAMUSCULAR | Status: AC
  Filled 2021-08-17: qty 2

## 2021-08-17 MED ORDER — ASPIRIN 81 MG PO CHEW: 81.0000 mg | CHEWABLE_TABLET | ORAL | Status: DC

## 2021-08-17 MED ORDER — FENTANYL CITRATE (PF) 100 MCG/2ML IJ SOLN
INTRAMUSCULAR | Status: AC
  Filled 2021-08-17: qty 2

## 2021-08-17 MED ORDER — AMLODIPINE BESYLATE 2.5 MG PO TABS
2.5000 mg | ORAL_TABLET | Freq: Every day | ORAL | Status: DC
  Administered 2021-08-18: 2.5 mg via ORAL
  Filled 2021-08-17: qty 1

## 2021-08-17 MED ORDER — LABETALOL HCL 5 MG/ML IV SOLN: 10.0000 mg | INTRAVENOUS | Status: AC | PRN

## 2021-08-17 MED ORDER — NITROGLYCERIN 0.4 MG SL SUBL: 0.4000 mg | SUBLINGUAL_TABLET | SUBLINGUAL | Status: DC | PRN

## 2021-08-17 MED ORDER — ASPIRIN 81 MG PO CHEW: 81.0000 mg | CHEWABLE_TABLET | Freq: Every day | ORAL | Status: DC

## 2021-08-17 MED ORDER — SODIUM CHLORIDE 0.9% FLUSH
3.0000 mL | Freq: Two times a day (BID) | INTRAVENOUS | Status: DC
  Administered 2021-08-17 – 2021-08-18 (×2): 3 mL via INTRAVENOUS

## 2021-08-17 MED ORDER — ONDANSETRON HCL 4 MG/2ML IJ SOLN: 4.0000 mg | Freq: Four times a day (QID) | INTRAMUSCULAR | Status: DC | PRN

## 2021-08-17 MED ORDER — LIDOCAINE HCL (PF) 1 % IJ SOLN
INTRAMUSCULAR | Status: DC | PRN
  Administered 2021-08-17: 2 mL
  Administered 2021-08-17: 15 mL

## 2021-08-17 MED ORDER — HEPARIN (PORCINE) IN NACL 1000-0.9 UT/500ML-% IV SOLN
INTRAVENOUS | Status: AC
  Filled 2021-08-17: qty 500

## 2021-08-17 MED ORDER — HEPARIN SODIUM (PORCINE) 1000 UNIT/ML IJ SOLN
INTRAMUSCULAR | Status: AC
  Filled 2021-08-17: qty 1

## 2021-08-17 MED ORDER — METOPROLOL SUCCINATE ER 25 MG PO TB24
25.0000 mg | ORAL_TABLET | Freq: Every day | ORAL | Status: DC
  Administered 2021-08-18: 25 mg via ORAL
  Filled 2021-08-17: qty 1

## 2021-08-17 MED ORDER — HYDRALAZINE HCL 20 MG/ML IJ SOLN: 10.0000 mg | INTRAMUSCULAR | Status: AC | PRN

## 2021-08-17 MED ORDER — PANTOPRAZOLE SODIUM 40 MG PO TBEC
40.0000 mg | DELAYED_RELEASE_TABLET | Freq: Every day | ORAL | Status: DC
  Administered 2021-08-18: 40 mg via ORAL
  Filled 2021-08-17: qty 1

## 2021-08-17 MED ORDER — SODIUM CHLORIDE 0.9 % WEIGHT BASED INFUSION: 1.0000 mL/kg/h | INTRAVENOUS | Status: DC

## 2021-08-17 MED ORDER — VERAPAMIL HCL 2.5 MG/ML IV SOLN
INTRAVENOUS | Status: AC
  Filled 2021-08-17: qty 2

## 2021-08-17 MED ORDER — HEPARIN SODIUM (PORCINE) 1000 UNIT/ML IJ SOLN
INTRAMUSCULAR | Status: DC | PRN
  Administered 2021-08-17: 1000 [IU] via INTRAVENOUS
  Administered 2021-08-17: 7000 [IU] via INTRAVENOUS
  Administered 2021-08-17: 1500 [IU] via INTRAVENOUS

## 2021-08-17 MED ORDER — MIDAZOLAM HCL 2 MG/2ML IJ SOLN
INTRAMUSCULAR | Status: DC | PRN
  Administered 2021-08-17 (×3): 1 mg via INTRAVENOUS

## 2021-08-17 MED ORDER — HEPARIN (PORCINE) IN NACL 1000-0.9 UT/500ML-% IV SOLN
INTRAVENOUS | Status: DC | PRN
  Administered 2021-08-17 (×4): 500 mL

## 2021-08-17 MED ORDER — HEPARIN (PORCINE) IN NACL 1000-0.9 UT/500ML-% IV SOLN
INTRAVENOUS | Status: AC
  Filled 2021-08-17: qty 1000

## 2021-08-17 MED ORDER — CLOPIDOGREL BISULFATE 300 MG PO TABS
ORAL_TABLET | ORAL | Status: AC
  Filled 2021-08-17: qty 2

## 2021-08-17 MED ORDER — ISOSORBIDE MONONITRATE ER 60 MG PO TB24
60.0000 mg | ORAL_TABLET | Freq: Every day | ORAL | Status: DC
  Administered 2021-08-18: 60 mg via ORAL
  Filled 2021-08-17: qty 1

## 2021-08-17 SURGICAL SUPPLY — 19 items
BALLN SAPPHIRE ~~LOC~~ 2.75X15 (BALLOONS) ×1 IMPLANT
BALLN SCOREFLEX 2.50X10 (BALLOONS) ×2
BALLOON SCOREFLEX 2.50X10 (BALLOONS) IMPLANT
CATH INFINITI 5FR MULTPACK ANG (CATHETERS) ×1 IMPLANT
CATH VISTA GUIDE 6FR JR4 (CATHETERS) ×1 IMPLANT
CATH VISTA GUIDE 6FR XBLAD3.5 (CATHETERS) ×1 IMPLANT
GLIDESHEATH SLEND SS 6F .021 (SHEATH) ×2 IMPLANT
GUIDEWIRE INQWIRE 1.5J.035X260 (WIRE) IMPLANT
INQWIRE 1.5J .035X260CM (WIRE) ×2
KIT ENCORE 26 ADVANTAGE (KITS) ×1 IMPLANT
KIT HEART LEFT (KITS) ×2 IMPLANT
PACK CARDIAC CATHETERIZATION (CUSTOM PROCEDURE TRAY) ×2 IMPLANT
SHEATH PINNACLE 5F 10CM (SHEATH) ×1 IMPLANT
SHEATH PINNACLE 6F 10CM (SHEATH) ×1 IMPLANT
SYR MEDRAD MARK 7 150ML (SYRINGE) ×2 IMPLANT
TRANSDUCER W/STOPCOCK (MISCELLANEOUS) ×2 IMPLANT
TUBING CIL FLEX 10 FLL-RA (TUBING) ×2 IMPLANT
WIRE COUGAR XT STRL 190CM (WIRE) ×1 IMPLANT
WIRE EMERALD 3MM-J .035X150CM (WIRE) ×1 IMPLANT

## 2021-08-17 NOTE — Interval H&P Note (Signed)
Cath Lab Visit (complete for each Cath Lab visit)  Clinical Evaluation Leading to the Procedure:   ACS: No.  Non-ACS:    Anginal Classification: CCS III  Anti-ischemic medical therapy: Maximal Therapy (2 or more classes of medications)  Non-Invasive Test Results: No non-invasive testing performed  Prior CABG: No previous CABG      History and Physical Interval Note:  08/17/2021 9:25 AM  Dorothy Lee  has presented today for surgery, with the diagnosis of angina.  The various methods of treatment have been discussed with the patient and family. After consideration of risks, benefits and other options for treatment, the patient has consented to  Procedure(s): LEFT HEART CATH AND CORONARY ANGIOGRAPHY (N/A) as a surgical intervention.  The patient's history has been reviewed, patient examined, no change in status, stable for surgery.  I have reviewed the patient's chart and labs.  Questions were answered to the patient's satisfaction.     Shelva Majestic

## 2021-08-17 NOTE — Progress Notes (Signed)
ACT 208. Carroll Kinds RN

## 2021-08-17 NOTE — Progress Notes (Signed)
6 Fr sheath aspirated and pulled from right femoral artery. Manual pressure applied for 20 minutes. Site level 0. Tegaderm dressing applied. Bed rest instructions given. Bilateral DP and PT pulse palpated.    Bedrest begins at 1725.

## 2021-08-17 NOTE — Plan of Care (Signed)
  Problem: Clinical Measurements: Goal: Ability to maintain clinical measurements within normal limits will improve Outcome: Progressing Goal: Will remain free from infection Outcome: Progressing Goal: Respiratory complications will improve Outcome: Progressing Goal: Cardiovascular complication will be avoided Outcome: Progressing   

## 2021-08-17 NOTE — Progress Notes (Signed)
ACT 184. Carroll Kinds RN

## 2021-08-18 ENCOUNTER — Encounter (HOSPITAL_COMMUNITY): Payer: Self-pay | Admitting: Cardiovascular Disease

## 2021-08-18 DIAGNOSIS — E782 Mixed hyperlipidemia: Secondary | ICD-10-CM | POA: Diagnosis not present

## 2021-08-18 DIAGNOSIS — I10 Essential (primary) hypertension: Secondary | ICD-10-CM | POA: Diagnosis not present

## 2021-08-18 DIAGNOSIS — I25118 Atherosclerotic heart disease of native coronary artery with other forms of angina pectoris: Secondary | ICD-10-CM | POA: Diagnosis not present

## 2021-08-18 DIAGNOSIS — R0609 Other forms of dyspnea: Secondary | ICD-10-CM | POA: Diagnosis not present

## 2021-08-18 DIAGNOSIS — I25119 Atherosclerotic heart disease of native coronary artery with unspecified angina pectoris: Secondary | ICD-10-CM | POA: Diagnosis not present

## 2021-08-18 LAB — CBC
HCT: 36.4 % (ref 36.0–46.0)
Hemoglobin: 11.8 g/dL — ABNORMAL LOW (ref 12.0–15.0)
MCH: 29.6 pg (ref 26.0–34.0)
MCHC: 32.4 g/dL (ref 30.0–36.0)
MCV: 91.5 fL (ref 80.0–100.0)
Platelets: 222 10*3/uL (ref 150–400)
RBC: 3.98 MIL/uL (ref 3.87–5.11)
RDW: 12.8 % (ref 11.5–15.5)
WBC: 5.7 10*3/uL (ref 4.0–10.5)
nRBC: 0 % (ref 0.0–0.2)

## 2021-08-18 LAB — BASIC METABOLIC PANEL
Anion gap: 7 (ref 5–15)
BUN: 11 mg/dL (ref 8–23)
CO2: 24 mmol/L (ref 22–32)
Calcium: 8.3 mg/dL — ABNORMAL LOW (ref 8.9–10.3)
Chloride: 107 mmol/L (ref 98–111)
Creatinine, Ser: 0.91 mg/dL (ref 0.44–1.00)
GFR, Estimated: >60 mL/min (ref >60)
Glucose, Bld: 82 mg/dL (ref 70–99)
Potassium: 3.9 mmol/L (ref 3.5–5.1)
Sodium: 138 mmol/L (ref 135–145)

## 2021-08-18 MED ORDER — CLOPIDOGREL BISULFATE 75 MG PO TABS: 75.0000 mg | ORAL_TABLET | Freq: Every day | ORAL | 2 refills | Status: DC

## 2021-08-18 MED FILL — Verapamil HCl IV Soln 2.5 MG/ML: INTRAVENOUS | Qty: 2 | Status: AC

## 2021-08-18 NOTE — Discharge Summary (Addendum)
Discharge Summary    Patient ID: Dorothy Lee MRN: [id number]; DOB: 04/26/1941  Admit date: 08/17/2021 Discharge date: 08/18/2021  PCP:  [name], MD   [facility] Providers Cardiologist:  [name], MD     Discharge Diagnoses    Principal Problem:   Coronary artery disease involving native coronary artery of native heart with angina pectoris [facility]) Active Problems:   Dyslipidemia   Essential hypertension   Accelerating angina [facility])   Diagnostic Studies/Procedures    Cath: 08/17/21  Prox RCA lesion is 90% stenosed.   Mid RCA lesion is 20% stenosed.   Prox LAD to Mid LAD lesion is 70% stenosed.   Ost LAD to Prox LAD lesion is 80% stenosed.   Post intervention, there is a 0% residual stenosis.   Post intervention, there is a 0% residual stenosis.   Post intervention, there is a 0% residual stenosis.   The left ventricular systolic function is normal.   LV end diastolic pressure is normal.   The left ventricular ejection fraction is 55-65% by visual estimate.   Two-vessel coronary obstructive disease with in-stent restenosis of the proximal to mid LAD stent with narrowing of 80 and 70%,  and 90% in-stent restenosis of the previously placed proximal to mid RCA stent and a dominant RCA.  The left circumflex coronary artery is a large caliber normal vessel.   Normal LV function with EF estimated 65% without focal segmental wall motion abnormalities.  LVEDP 14 mmHg   Successful two-vessel coronary intervention utilizing a ScorFlex 2.5 x 10 mm cutting balloon and post stent dilatation with a 2.75 x 15 mm [place] balloon utilized in both the RCA and LAD vessels with the in-stent  stenosis in both vessels being reduced to 0% and brisk TIMI-3 flow.   RECOMMENDATION: DAPT for 6 to 12 months.  Medical therapy with optimal blood pressure control aggressive lipid management.  If patient cannot tolerate high potency statin therapy with possible concomitant Zetia, consider  PCSK9 inhibition.  Diagnostic Dominance: Right Intervention   _____________   History of Present Illness     Dorothy Lee is a 80 y.o. female with past medical history of CAD status post DES to RCA/LAD ' 09, DES to mid LAD '14, hypertension, hyperlipidemia, GERD, history of breast cancer who was recently seen in the office with reports of fatigue and shortness of breath.  Noted she had a recent [place] back in 12/2020 which was low risk demonstrating mild ischemic territory with mixed component of scar in the mid to basal inferior septal wall.  She has been treated medically.  She had been compliant with her medications and was using nitroglycerin intermittently.  Given her symptoms she was set up for outpatient cardiac catheterization.  Hospital Course     Underwent cardiac catheterization noted above with two-vessel obstructive coronary disease with in-stent restenosis of the proximal/mid LAD stent of 80% along with 90% in-stent gnosis of the proximal/mid RCA stent.  Successful two-vessel intervention with [place] Balloon post dilatation, both vessels being reduced to 0% lesion with brisk TIMI-3 flow.  Recommendations for DAPT with aspirin/Plavix for at least 6 months ideally 12 months. Unable to tolerate higher dose statin 2/2 myalgias. Tolerating Crestor 10mg  daily. Will refer to lipid clinic at discharge. She is advised to stop mobic in the setting of DAPT. Worked with CR without recurrent chest pain.    General: Well developed, well nourished, female appearing in no acute distress. Head: Normocephalic, atraumatic.  Neck: Supple without  bruits, JVD. Lungs:  Resp regular and unlabored, CTA. Heart: RRR, S1, S2, no S3, S4, or murmur; no rub. Abdomen: Soft, non-tender, non-distended with normoactive bowel sounds. No hepatomegaly. No rebound/guarding. No obvious abdominal masses. Extremities: No clubbing, cyanosis, edema. Distal pedal pulses are 2+ bilaterally. Right femoral cath site  stable without bruising or hematoma Neuro: Alert and oriented X 3. Moves all extremities spontaneously. Psych: Normal affect.  Patient was seen by Dr. Burt Knack and deemed stable for discharge home. Follow up arranged in the office. Medications sent to the pharmacy. Educated by PharmD prior to discharge.   Did the patient have an acute coronary syndrome (MI, NSTEMI, STEMI, etc) this admission?:  No                               Did the patient have a percutaneous coronary intervention (stent / angioplasty)?:  Yes.     Cath/PCI Registry Performance & Quality Measures: Aspirin prescribed? - Yes ADP Receptor Inhibitor (Plavix/Clopidogrel, Brilinta/Ticagrelor or Effient/Prasugrel) prescribed (includes medically managed patients)? - Yes High Intensity Statin (Lipitor 40-80mg  or Crestor 20-40mg ) prescribed? - No - intolerant For EF <40%, was ACEI/ARB prescribed? - Not Applicable (EF >/= 95%) For EF <40%, Aldosterone Antagonist (Spironolactone or Eplerenone) prescribed? - Not Applicable (EF >/= 62%) Cardiac Rehab Phase II ordered? - Yes   The patient will be scheduled for a TOC follow up appointment in 10-14 days.  A message has been sent to the [facility] and Scheduling Pool at the office where the patient should be seen for follow up.  _____________  Discharge Vitals Blood pressure 121/61, pulse 66, temperature 98.7 F (37.1 C), temperature source Oral, resp. rate 17, height 5' (1.524 m), weight 60.3 kg, SpO2 98 %.  Filed Weights   08/17/21 0654  Weight: 60.3 kg    Labs & Radiologic Studies    CBC Recent Labs    08/18/21 0337  WBC 5.7  HGB 11.8*  HCT 36.4  MCV 91.5  PLT 130   Basic Metabolic Panel Recent Labs    08/18/21 0337  NA 138  K 3.9  CL 107  CO2 24  GLUCOSE 82  BUN 11  CREATININE 0.91  CALCIUM 8.3*   Liver Function Tests No results for input(s): AST, ALT, ALKPHOS, BILITOT, PROT, ALBUMIN in the last 72 hours. No results for input(s): LIPASE, AMYLASE in the last 72  hours. High Sensitivity Troponin:   No results for input(s): TROPONINIHS in the last 720 hours.  BNP Invalid input(s): POCBNP D-Dimer No results for input(s): DDIMER in the last 72 hours. Hemoglobin A1C No results for input(s): HGBA1C in the last 72 hours. Fasting Lipid Panel No results for input(s): CHOL, HDL, LDLCALC, TRIG, CHOLHDL, LDLDIRECT in the last 72 hours. Thyroid Function Tests No results for input(s): TSH, T4TOTAL, T3FREE, THYROIDAB in the last 72 hours.  Invalid input(s): FREET3 _____________  CARDIAC CATHETERIZATION  Result Date: 08/17/2021   Prox RCA lesion is 90% stenosed.   Mid RCA lesion is 20% stenosed.   Prox LAD to Mid LAD lesion is 70% stenosed.   Ost LAD to Prox LAD lesion is 80% stenosed.   Post intervention, there is a 0% residual stenosis.   Post intervention, there is a 0% residual stenosis.   Post intervention, there is a 0% residual stenosis.   The left ventricular systolic function is normal.   LV end diastolic pressure is normal.   The left ventricular ejection  fraction is 55-65% by visual estimate. Two-vessel coronary obstructive disease with in-stent restenosis of the proximal to mid LAD stent with narrowing of 80 and 70%,  and 90% in-stent restenosis of the previously placed proximal to mid RCA stent and a dominant RCA.  The left circumflex coronary artery is a large caliber normal vessel. Normal LV function with EF estimated 65% without focal segmental wall motion abnormalities.  LVEDP 14 mmHg Successful two-vessel coronary intervention utilizing a ScorFlex 2.5 x 10 mm cutting balloon and post stent dilatation with a 2.75 x 15 mm [place] balloon utilized in both the RCA and LAD vessels with the in-stent  stenosis in both vessels being reduced to 0% and brisk TIMI-3 flow. RECOMMENDATION: DAPT for 6 to 12 months.  Medical therapy with optimal blood pressure control aggressive lipid management.  If patient cannot tolerate high potency statin therapy with possible  concomitant Zetia, consider PCSK9 inhibition.   Disposition   Pt is being discharged home today in good condition.  Follow-up Plans & Appointments     Follow-up Information     Erma Heritage, PA-C Follow up on 09/15/2021.   Specialties: Physician Assistant, Cardiology Why: at 3:30pm for your follow up appt. Contact information: [address] [place] [address] [phone]                Discharge Instructions     AMB Referral to Advanced Lipid Disorders Clinic   Complete by: As directed    Internal Lipid Clinic Referral Scheduling  Internal lipid clinic referrals are providers within [facility], who wish to refer established patients for routine management (help in starting PCSK9 inhibitor therapy) or advanced therapies.  Internal MD referral criteria:              1. All patients with LDL>190 mg/dL  2. All patients with Triglycerides >500 mg/dL  3. Patients with suspected or confirmed heterozygous familial hyperlipidemia (HeFH) or homozygous familial hyperlipidemia (HoFH)  4. Patients with family history of suspicious for genetic dyslipidemia desiring genetic testing  5. Patients refractory to standard guideline based therapy  6. Patients with statin intolerance (failed 2 statins, one of which must be a high potency statin)  7. Patients who the provider desires to be seen by MD   Internal PharmD referral criteria:   1. Follow-up patients for medication management  2. Follow-up for compliance monitoring  3. Patients for drug education  4. Patients with statin intolerance  5. PCSK9 inhibitor education and prior authorization approvals  6. Patients with triglycerides <500 mg/dL  External Lipid Clinic Referral  External lipid clinic referrals are for providers outside of [facility], considered new clinic patients - automatically routed to MD schedule   Amb Referral to Cardiac Rehabilitation   Complete by: As directed    To [place]   Diagnosis: PTCA    After initial evaluation and assessments completed: Virtual Based Care may be provided alone or in conjunction with Phase 2 Cardiac Rehab based on patient barriers.: Yes   Call MD for:  difficulty breathing, headache or visual disturbances   Complete by: As directed    Call MD for:  persistant dizziness or light-headedness   Complete by: As directed    Call MD for:  redness, tenderness, or signs of infection (pain, swelling, redness, odor or green/yellow discharge around incision site)   Complete by: As directed    Diet - low sodium heart healthy   Complete by: As directed    Discharge instructions  Complete by: As directed    Groin Site Care Refer to this sheet in the next few weeks. These instructions provide you with information on caring for yourself after your procedure. Your caregiver may also give you more specific instructions. Your treatment has been planned according to current medical practices, but problems sometimes occur. Call your caregiver if you have any problems or questions after your procedure. HOME CARE INSTRUCTIONS You may shower 24 hours after the procedure. Remove the bandage (dressing) and gently wash the site with plain soap and water. Gently pat the site dry.  Do not apply powder or lotion to the site.  Do not sit in a bathtub, swimming pool, or whirlpool for 5 to 7 days.  No bending, squatting, or lifting anything over 10 pounds (4.5 kg) as directed by your caregiver.  Inspect the site at least twice daily.  Do not drive home if you are discharged the same day of the procedure. Have someone else drive you.  You may drive 24 hours after the procedure unless otherwise instructed by your caregiver.  What to expect: Any bruising will usually fade within 1 to 2 weeks.  Blood that collects in the tissue (hematoma) may be painful to the touch. It should usually decrease in size and tenderness within 1 to 2 weeks.  SEEK IMMEDIATE MEDICAL CARE IF: You have unusual pain at  the groin site or down the affected leg.  You have redness, warmth, swelling, or pain at the groin site.  You have drainage (other than a small amount of blood on the dressing).  You have chills.  You have a fever or persistent symptoms for more than 72 hours.  You have a fever and your symptoms suddenly get worse.  Your leg becomes pale, cool, tingly, or numb.  You have heavy bleeding from the site. Hold pressure on the site. Marland Kitchen  PLEASE DO NOT MISS ANY DOSES OF YOUR PLAVIX!!!!! Also keep a log of you blood pressures and bring back to your follow up appt. Please call the office with any questions.   Patients taking blood thinners should generally stay away from medicines like ibuprofen, Advil, Motrin, naproxen, and Aleve due to risk of stomach bleeding. You may take Tylenol as directed or talk to your primary doctor about alternatives.  PLEASE ENSURE THAT YOU DO NOT RUN OUT OF YOUR PLAVIX. This medication is very important to remain on for at least one year. IF you have issues obtaining this medication due to cost please CALL the office 3-5 business days prior to running out in order to prevent missing doses of this medication.   Increase activity slowly   Complete by: As directed        Discharge Medications   Allergies as of 08/18/2021       Reactions   Crestor [rosuvastatin]    Crestor 20 mg caused muscle aches - pt is able to tolerate lower doses (takes 10mg )   Lipitor [atorvastatin]    Atorvastatin 40 mg caused fatigue   Lisinopril Cough   Simvastatin    Muscle aches        Medication List     STOP taking these medications    meloxicam 15 MG tablet Commonly known as: MOBIC       TAKE these medications    amLODipine 5 MG tablet Commonly known as: NORVASC Take 2.5 mg by mouth daily.   aspirin EC 81 MG tablet Take 81 mg by mouth daily.   BENEFIBER PO  Take 1 Dose by mouth daily.   clopidogrel 75 MG tablet Commonly known as: PLAVIX Take 1 tablet (75 mg  total) by mouth daily with breakfast. Start taking on: August 19, 2021   isosorbide mononitrate 60 MG 24 hr tablet Commonly known as: IMDUR TAKE 1 TABLET BY MOUTH DAILY What changed: how much to take   latanoprost 0.005 % ophthalmic solution Commonly known as: XALATAN Place 1 drop into both eyes at bedtime.   meclizine 25 MG tablet Commonly known as: ANTIVERT Take 1 tablet (25 mg total) by mouth 3 (three) times daily as needed for dizziness.   metoprolol succinate 25 MG 24 hr tablet Commonly known as: TOPROL-XL TAKE 1 TABLET BY MOUTH DAILY   montelukast 10 MG tablet Commonly known as: SINGULAIR Take 10 mg by mouth daily.   nitroGLYCERIN 0.4 MG SL tablet Commonly known as: NITROSTAT Place 1 tablet (0.4 mg total) under the tongue every 5 (five) minutes x 3 doses as needed for chest pain. May repeat x3   pantoprazole 40 MG tablet Commonly known as: PROTONIX TAKE 1 TABLET BY MOUTH DAILY   rosuvastatin 10 MG tablet Commonly known as: CRESTOR Take 10 mg by mouth daily.        Outstanding Labs/Studies   N/a   Duration of Discharge Encounter   Greater than 30 minutes including physician time.  Signed, Reino Bellis, NP 08/18/2021, 10:02 AM  Patient seen, examined. Available data reviewed. Agree with findings, assessment, and plan as outlined by Reino Bellis, NP.  The patient is independently interviewed and examined.  She is alert and oriented in no distress.  Lungs are clear, heart is regular rate and rhythm without murmur gallop, abdomen soft nontender, extremities have no edema.  I reviewed her cardiac catheterization films from yesterday's procedure.  The patient is feeling well with no recurrent angina.  She has tolerated clopidogrel well in the past with her coronary interventions.  She understands the importance of adherence to dual antiplatelet therapy for at least 6 months.  Patient is medically stable for hospital discharge today with follow-up as outlined  above.  Sherren Mocha, M.D. 08/18/2021 10:52 AM

## 2021-08-18 NOTE — Progress Notes (Signed)
CARDIAC REHAB PHASE I   PRE:  Rate/Rhythm: 86 SR    BP: sitting 127/47    SaO2: 99 RA  MODE:  Ambulation: 400 ft   POST:  Rate/Rhythm: 105 ST    BP: sitting 145/59     SaO2: 100 RA  Tolerated well, no c/o. Discussed PTCA, restrictions, exercise, NTG, Plavix, and CRPII. Pt goes to gym regularly, doing well with exercise. Will refer to [facility] as pt is thinking of program. [place], ACSM 08/18/2021 9:07 AM

## 2021-08-21 ENCOUNTER — Telehealth: Payer: Self-pay | Admitting: Cardiology

## 2021-08-21 NOTE — Telephone Encounter (Signed)
Patient states she had a procedure Thursday and would like to know if she can have her last covid and flu shot today.

## 2021-08-21 NOTE — Telephone Encounter (Signed)
Pt aware that she can get her vaccines today. Pt verbalized understanding

## 2021-08-22 ENCOUNTER — Telehealth (HOSPITAL_COMMUNITY): Payer: Self-pay

## 2021-08-22 NOTE — Telephone Encounter (Signed)
Cardiac rehab referral for Ph.II faxed to [facility].

## 2021-09-15 ENCOUNTER — Encounter: Payer: Self-pay | Admitting: Student

## 2021-09-15 ENCOUNTER — Ambulatory Visit (INDEPENDENT_AMBULATORY_CARE_PROVIDER_SITE_OTHER): Payer: Medicare Other | Admitting: Student

## 2021-09-15 ENCOUNTER — Other Ambulatory Visit: Payer: Self-pay

## 2021-09-15 VITALS — BP 138/84 | HR 71 | Ht 60.0 in | Wt 135.0 lb

## 2021-09-15 DIAGNOSIS — E785 Hyperlipidemia, unspecified: Secondary | ICD-10-CM | POA: Diagnosis not present

## 2021-09-15 DIAGNOSIS — I200 Unstable angina: Secondary | ICD-10-CM | POA: Diagnosis not present

## 2021-09-15 DIAGNOSIS — I2510 Atherosclerotic heart disease of native coronary artery without angina pectoris: Secondary | ICD-10-CM

## 2021-09-15 DIAGNOSIS — K219 Gastro-esophageal reflux disease without esophagitis: Secondary | ICD-10-CM | POA: Diagnosis not present

## 2021-09-15 DIAGNOSIS — I10 Essential (primary) hypertension: Secondary | ICD-10-CM

## 2021-09-15 NOTE — Progress Notes (Signed)
Cardiology Office Note    Date:  09/16/2021   ID:  Dorothy Lee, DOB [birth year], MRN [id number]  PCP:  [name], MD  Cardiologist: [name], MD    Chief Complaint  Patient presents with   Follow-up    S/p cardiac catheterization    History of Present Illness:    Dorothy Lee is a 80 y.o. female with past medical history of CAD (s/p DES to RCA in 2009, DES to mid-LAD in 2014), HTN, HLD, GERD and history of breast cancer who presents to the office today for follow-up from her recent cardiac catheterization.   Was examined by Dr. Domenic Polite in 08/2021 and reported worsening dyspnea on exertion and fatigue for the past month. A follow-up cardiac catheterization was recommended for further evaluation. Her catheterization showed ISR of the proximal to mid-LAD stent and 90% ISR of her RCA stent. Underwent cutting balloon angioplasty of the LAD and RCA. Was recommended to be on DAPT for 6-12 months and Mobic was discontinued. Was continued on Crestor 10 mg daily as she had been intolerant to higher intensity statin therapy in the past.  In talking with the patient today, she reports overall doing well since her recent catheterization. Reports that her dyspnea has significantly improved and she has not had any recurrent shortness of breath. Denies any exertional chest pain. Does report an occasional discomfort along her epigastric/sternal region and is unsure if this is due to a cardiac cause or known acid reflux. This typically occurs at rest. No recent orthopnea, PND, lower extremity edema or palpitations. She does report weakness along her lower extremities for the past few months and was previously going to the gym but quit doing so prior to her catheterization given her symptoms. She is planning to resume these activities going forward.  Past Medical History:  Diagnosis Date   Allergic rhinitis    CAD (coronary artery disease)    a. s/p DES to RCA in 2009 b.  DES to mid-LAD in  2014 c. 08/2021: ISR of LAD and RCA stents treated with cutting balloon angioplasty   Cancer [facility])    Diverticulitis    Diverticulosis    Essential hypertension    GERD (gastroesophageal reflux disease)    Glaucoma    History of breast cancer    Right - s/p XRT 1984, surgery, no chemo   Hyperlipidemia    Personal history of radiation therapy    Rotator cuff syndrome, left    Scoliosis    Urinary incontinence     Past Surgical History:  Procedure Laterality Date   [place]   right breast   COLONOSCOPY  2008   Dr. Oletta Lamas: diverticulosis, hemorrhoids, conscious sedation   COLONOSCOPY N/A 01/30/2016   Dr. Gala Romney: mild divertiuculosis in sigmoid colon. narrowing of the colon in association with the deverticular opening. 6 polyps removed, tubular adenoma. next TCS in 3 years   COLONOSCOPY WITH PROPOFOL N/A 04/17/2021   one 4 mm polyp at splenic flexure, otherwise normal. Tubular adenoma.   CORONARY ANGIOPLASTY WITH STENT PLACEMENT  02/2008   CORONARY BALLOON ANGIOPLASTY N/A 08/17/2021   Procedure: CORONARY BALLOON ANGIOPLASTY;  Surgeon: Troy Sine, MD;  Location: [place] [facility];  Service: Cardiovascular;  Laterality: N/A;   ESOPHAGOGASTRODUODENOSCOPY  2014   Dr. Fuller Plan: erosive gastritis, no h.pylori   ESOPHAGOGASTRODUODENOSCOPY N/A 01/30/2016   Dr. Gala Romney: normal esophagus s/p dilation, small hh.  ESOPHAGOGASTRODUODENOSCOPY (EGD) WITH PROPOFOL N/A 04/17/2021   mild Schatzki ring, s/p dilation. Small hiatal hernia. Normal duodenum.   LEFT HEART CATH AND CORONARY ANGIOGRAPHY N/A 08/17/2021   Procedure: LEFT HEART CATH AND CORONARY ANGIOGRAPHY;  Surgeon: Troy Sine, MD;  Location: [place] [facility];  Service: Cardiovascular;  Laterality: N/A;   LEFT HEART CATHETERIZATION WITH CORONARY ANGIOGRAM N/A 12/02/2012   Procedure: LEFT HEART CATHETERIZATION WITH CORONARY ANGIOGRAM;  Surgeon: Jettie Booze, MD;   Location[facility];  Service: Cardiovascular;  Laterality: N/A;   MALONEY DILATION N/A 01/30/2016   Procedure: Venia Minks DILATION;  Surgeon: Daneil Dolin, MD;  [facility];  Service: Endoscopy;  Laterality: N/A;   MALONEY DILATION N/A 04/17/2021   Procedure: Venia Minks DILATION;  Surgeon: Daneil Dolin, MD;  [facility];  Service: Endoscopy;  Laterality: N/A;   NASAL SINUS SURGERY     PARTIAL COLECTOMY N/A 05/19/2018   Procedure: PARTIAL COLECTOMY;  Surgeon: Aviva Signs, MD;  Location: [facility];  Service: General;  Laterality: N/A;   PERCUTANEOUS CORONARY STENT INTERVENTION (PCI-S)  12/02/2012   Procedure: PERCUTANEOUS CORONARY STENT INTERVENTION (PCI-S);  Surgeon: Jettie Booze, MD;  Location: [facility] CATH LAB;  Service: Cardiovascular;;  DES to the MID LAD   POLYPECTOMY  04/17/2021   Procedure: POLYPECTOMY;  Surgeon: Daneil Dolin, MD;  [facility]: [facility];  Service: Endoscopy;;    Current Medications: Outpatient Medications Prior to Visit  Medication Sig Dispense Refill   amLODipine (NORVASC) 5 MG tablet Take 2.5 mg by mouth daily.     aspirin EC 81 MG tablet Take 81 mg by mouth daily.      clopidogrel (PLAVIX) 75 MG tablet Take 1 tablet (75 mg total) by mouth daily with breakfast. 90 tablet 2   isosorbide mononitrate (IMDUR) 60 MG 24 hr tablet TAKE 1 TABLET BY MOUTH DAILY (Patient taking differently: Take 30 mg by mouth daily.) 90 tablet 3   latanoprost (XALATAN) 0.005 % ophthalmic solution Place 1 drop into both eyes at bedtime.      meclizine (ANTIVERT) 25 MG tablet Take 1 tablet (25 mg total) by mouth 3 (three) times daily as needed for dizziness. 30 tablet 0   metoprolol succinate (TOPROL-XL) 25 MG 24 hr tablet TAKE 1 TABLET BY MOUTH DAILY 90 tablet 2   montelukast (SINGULAIR) 10 MG tablet Take 10 mg by mouth daily.     nitroGLYCERIN (NITROSTAT) 0.4 MG SL tablet Place 1 tablet (0.4 mg total) under the tongue every 5 (five) minutes x 3 doses as needed  for chest pain. May repeat x3 25 tablet 3   pantoprazole (PROTONIX) 40 MG tablet TAKE 1 TABLET BY MOUTH DAILY (Patient taking differently: Take 40 mg by mouth daily.) 90 tablet 3   rosuvastatin (CRESTOR) 10 MG tablet Take 10 mg by mouth daily.     Wheat Dextrin (BENEFIBER PO) Take 1 Dose by mouth daily.     No facility-administered medications prior to visit.     Allergies:   Crestor [rosuvastatin], Lipitor [atorvastatin], Lisinopril, and Simvastatin   Social History   Socioeconomic History   Marital status: Married    Spouse name: Not on file   Number of children: 1   Years of education: Not on file   Highest education level: Not on file  Occupational History   Occupation: hair stylist   Occupation: COSMETOLOGY    Employer: [employer]  Tobacco Use   Smoking status: Never   Smokeless  tobacco: Never   Tobacco comments:    Widowed, lives alone but has sig other.   Vaping Use   Vaping Use: Never used  Substance and Sexual Activity   Alcohol use: Not Currently   Drug use: No   Sexual activity: Yes    Birth control/protection: Post-menopausal  Other Topics Concern   Not on file  Social History Narrative   Widowed, husband died.    Lives alone but has significant other, married in 01-17-16 to [place].    Had one son, but he passed away.    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 Has two grandsons.       Social Determinants of Health   Financial Resource Strain: Not on file  Food Insecurity: Not on file  Transportation Needs: Not on file  Physical Activity: Not on file  Stress: Not on file  Social Connections: Not on file     Family History:  The patient's family history includes Emphysema in her brother and father; Heart attack in her brother, sister, and son; Heart disease in her brother; Lung cancer in her brother; Throat cancer in her brother.   Review of Systems:    Please see the  history of present illness.     All other systems reviewed and are otherwise negative except as noted above.   Physical Exam:    VS:  BP 138/84   Pulse 71   Ht 5' (1.524 m)   Wt 135 lb (61.2 kg)   SpO2 97%   BMI 26.37 kg/m    General: Well developed, well nourished,female appearing in no acute distress. Head: Normocephalic, atraumatic. Neck: No carotid bruits. JVD not elevated.  Lungs: Respirations regular and unlabored, without wheezes or rales.  Heart: Regular rate and rhythm. No S3 or S4.  No murmur, no rubs, or gallops appreciated. Abdomen: Appears non-distended. No obvious abdominal masses. Msk:  Strength and tone appear normal for age. No obvious joint deformities or effusions. Extremities: No clubbing or cyanosis. No pitting edema.  Distal pedal pulses are 2+ bilaterally. Neuro: Alert and oriented X 3. Moves all extremities spontaneously. No focal deficits noted. Psych:  Responds to questions appropriately with a normal affect. Skin: No rashes or lesions noted  Wt Readings from Last 3 Encounters:  09/15/21 135 lb (61.2 kg)  08/17/21 133 lb (60.3 kg)  08/15/21 133 lb 12.8 oz (60.7 kg)     Studies/Labs Reviewed:   EKG:  EKG is ordered today.  The ekg ordered today demonstrates NSR, HR 71 with known RBBB.   Recent Labs: 08/10/2021: ALT 16 08/18/2021: BUN 11; Creatinine, Ser 0.91; Hemoglobin 11.8; Platelets 222; Potassium 3.9; Sodium 138   Lipid Panel    Component Value Date/Time   CHOL 181 05/30/2020 1232   TRIG 115 05/30/2020 1232   TRIG 119 05/16/2010 0000   HDL 59 05/30/2020 1232   CHOLHDL 3.1 05/30/2020 1232   VLDL 23 05/30/2020 1232   LDLCALC 99 05/30/2020 1232   LDLCALC 99 05/05/2019 1124   LDLDIRECT 144.4 11/09/2013 0934    Additional studies/ records that were reviewed today include:   Echocardiogram: 08/2018 Study Conclusions   - Left ventricle: The cavity size was normal. Systolic function was    normal. The estimated ejection fraction was in  the range of 60%    to 65%. Wall motion was normal; there were no regional wall    motion  abnormalities. Left ventricular diastolic function    parameters were normal.  - Aortic valve: Sclerosis without stenosis.  - Atrial septum: No defect or patent foramen ovale was identified.   Cardiac Catheterization: 08/17/2021 Prox RCA lesion is 90% stenosed.   Mid RCA lesion is 20% stenosed.   Prox LAD to Mid LAD lesion is 70% stenosed.   Ost LAD to Prox LAD lesion is 80% stenosed.   Post intervention, there is a 0% residual stenosis.   Post intervention, there is a 0% residual stenosis.   Post intervention, there is a 0% residual stenosis.   The left ventricular systolic function is normal.   LV end diastolic pressure is normal.   The left ventricular ejection fraction is 55-65% by visual estimate.   Two-vessel coronary obstructive disease with in-stent restenosis of the proximal to mid LAD stent with narrowing of 80 and 70%,  and 90% in-stent restenosis of the previously placed proximal to mid RCA stent and a dominant RCA.  The left circumflex coronary artery is a large caliber normal vessel.   Normal LV function with EF estimated 65% without focal segmental wall motion abnormalities.  LVEDP 14 mmHg   Successful two-vessel coronary intervention utilizing a ScorFlex 2.5 x 10 mm cutting balloon and post stent dilatation with a 2.75 x 15 mm [place] balloon utilized in both the RCA and LAD vessels with the in-stent  stenosis in both vessels being reduced to 0% and brisk TIMI-3 flow.   RECOMMENDATION: DAPT for 6 to 12 months.  Medical therapy with optimal blood pressure control aggressive lipid management.  If patient cannot tolerate high potency statin therapy with possible concomitant Zetia, consider PCSK9 inhibition.  Assessment:    1. Coronary artery disease involving native coronary artery of native heart without angina pectoris   2. Essential hypertension   3. Hyperlipidemia LDL goal <70   4.  Gastroesophageal reflux disease, unspecified whether esophagitis present      Plan:   In order of problems listed above:  1. CAD - She is s/p DES to RCA in 2009, DES to mid-LAD in 2014 and recent catheterization in 08/2021 showed ISR of the proximal to mid-LAD stent and ISR of her RCA stent and underwent cutting balloon angioplasty to both.  - She reports her breathing is now back to baseline and she denies any exertional chest pain. We reviewed ways for her to increase her activity level at home and she is planning to return to the gym.  - Continue ASA 81mg  daily, Plavix 75mg  daily, Imdur 30mg  daily, Toprol-XL 25mg  daily and Crestor 10mg  daily.   2. HTN - Her BP is well-controlled at 138/84 during today's visit. Continue current medication regimen with Amlodipine 2.5mg  daily, Imdur 30mg  daily and Toprol-XL 25mg  daily.   3. HLD - She was previously intolerant to high-intensity statin therapy but has done well with Crestor 10mg  daily. Will request a copy of labs from her PCP. If LDL not at goal, would recommend adding Zetia. Can consider referral to the Lipid Clinic in the future pending reassessment of her LDL.   4. GERD - Her recent epigastric discomfort as described above sounds most consistent with GERD. She remains on Protonix 40mg  daily and we reviewed she could use TUMS if needed. Also reviewed if symptoms do not improve with routine measures, then she could try SL NTG and she is aware of how to use this.    Medication Adjustments/Labs and Tests Ordered: Current medicines are reviewed at length with  the patient today.  Concerns regarding medicines are outlined above.  Medication changes, Labs and Tests ordered today are listed in the Patient Instructions below. Patient Instructions  Medication Instructions:   Your physician recommends that you continue on your current medications as directed. Please refer to the Current Medication list given to you  today.   Labwork:none   Testing/Procedures: None   Follow-Up: 3 months  Any Other Special Instructions Will Be Listed Below (If Applicable).  If you need a refill on your cardiac medications before your next appointment, please call your pharmacy.   Signed, Erma Heritage, PA-C  09/16/2021 8:33 AM    [facility] [address] [place], [place] [address] Phone: [phone] Fax: [phone]

## 2021-09-15 NOTE — Patient Instructions (Signed)
Medication Instructions:   Your physician recommends that you continue on your current medications as directed. Please refer to the Current Medication list given to you today.   Labwork:none   Testing/Procedures: None   Follow-Up: 3 months  Any Other Special Instructions Will Be Listed Below (If Applicable).  If you need a refill on your cardiac medications before your next appointment, please call your pharmacy.

## 2021-09-16 ENCOUNTER — Encounter: Payer: Self-pay | Admitting: Student

## 2021-09-19 ENCOUNTER — Telehealth: Payer: Self-pay

## 2021-09-19 DIAGNOSIS — E785 Hyperlipidemia, unspecified: Secondary | ICD-10-CM

## 2021-09-19 NOTE — Telephone Encounter (Signed)
-----   Message from Satira Sark, MD sent at 09/18/2021  4:56 PM EST ----- Results reviewed.  Lipid profile from April revealed LDL 111.  She is tolerating Crestor 10 mg daily.  Please see if she can get a follow-up FLP in case we need to make further adjustments.

## 2021-09-19 NOTE — Telephone Encounter (Signed)
I spoke with patient. She will get FLP at [facility] next week.

## 2021-10-04 ENCOUNTER — Other Ambulatory Visit (HOSPITAL_COMMUNITY)
Admission: RE | Admit: 2021-10-04 | Discharge: 2021-10-04 | Disposition: A | Discharge status: Home / Self Care | Payer: Medicare Other | Source: Ambulatory Visit | Attending: Cardiology | Admitting: Cardiology

## 2021-10-04 DIAGNOSIS — E785 Hyperlipidemia, unspecified: Secondary | ICD-10-CM | POA: Diagnosis present

## 2021-10-04 LAB — LIPID PANEL
Cholesterol: 202 mg/dL — ABNORMAL HIGH (ref 0–200)
HDL: 67 mg/dL (ref >40)
LDL Cholesterol: 117 mg/dL — ABNORMAL HIGH (ref 0–99)
Total CHOL/HDL Ratio: 3 RATIO
Triglycerides: 90 mg/dL (ref <150)
VLDL: 18 mg/dL (ref 0–40)

## 2021-10-12 ENCOUNTER — Telehealth: Payer: Self-pay | Admitting: Gastroenterology

## 2021-10-12 ENCOUNTER — Telehealth: Payer: Self-pay

## 2021-10-12 MED ORDER — ROSUVASTATIN CALCIUM 20 MG PO TABS: 20.0000 mg | ORAL_TABLET | Freq: Every day | ORAL | 3 refills | Status: DC

## 2021-10-12 NOTE — Telephone Encounter (Signed)
Returned the pt's call and LMOVM to return call 

## 2021-10-12 NOTE — Telephone Encounter (Signed)
Patient would like a nurse to give her a call about labs.  She never heard the results of the labs she had a few weeks ago

## 2021-10-12 NOTE — Telephone Encounter (Signed)
Pt returned call and I advised her that we had spoke on 08/14/2021 and I advised her of her lab results. Pt did not remember speaking with me.

## 2021-10-12 NOTE — Telephone Encounter (Signed)
-----   Message from Merlene Laughter, RN sent at 10/11/2021  4:11 PM EST -----  ----- Message ----- From: Satira Sark, MD Sent: 10/04/2021   3:33 PM EST To: Merlene Laughter, RN  Results reviewed.  Lipid panel shows LDL 117.  Suggest increase Crestor to 20 mg daily.

## 2021-10-12 NOTE — Telephone Encounter (Signed)
I discussed lab results with patient. She will increase Crestor to 20 mg daily. E-scribed to [employer]

## 2021-11-03 ENCOUNTER — Other Ambulatory Visit: Payer: Self-pay | Admitting: Cardiology

## 2021-11-04 ENCOUNTER — Other Ambulatory Visit: Payer: Self-pay | Admitting: Cardiology

## 2021-11-07 NOTE — Progress Notes (Signed)
Cardiology Office Note    Date:  11/08/2021   ID:  Dorothy Lee, DOB 1940-12-31, MRN [id number]  PCP:  [name], MD  Cardiologist: [name], MD    Chief Complaint  Patient presents with   Follow-up    Recent chest pain    History of Present Illness:    Dorothy Lee is a 81 y.o. female with past medical history of CAD (s/p DES to RCA in 2009, DES to mid-LAD in 2014, s/p cath in 08/2021 showing ISR of LAD and RCA stents which were treated with cutting balloon angioplasty), HTN, HLD, GERD and history of breast cancer who presents to the office today for evaluation of chest pain.   She was last examined by myself in 09/2021 for follow-up from her recent cardiac catheterization and reported significant improvement in her dyspnea following recent PCI. Was having some epigastric/sternal discomfort at times but symptoms would occur at rest and was continued on PPI therapy. She did have an FLP earlier this month which showed her LDL was at 117, therefore Crestor was increased to 20mg  daily.   In talking with the patient today, she reports having episodic sternal/epigastric discomfort for the past few week and symptoms occur at rest. Reports her discomfort typically lasts for a few minutes and spontaneously resolves. She has taken OTC antacids with improvement in her symptoms. There is no association with exertion or positional changes. She reports her breathing has been doing well since catheterization in 08/2021 and no recent change in this. No reported orthopnea, PND or pitting edema. She has experienced pain along her legs bilaterally since Crestor was increased earlier this month.    Past Medical History:  Diagnosis Date   Allergic rhinitis    CAD (coronary artery disease)    a. s/p DES to RCA in 2009 b.  DES to mid-LAD in 2014 c. 08/2021: ISR of LAD and RCA stents treated with cutting balloon angioplasty   Cancer [facility])    Diverticulitis    Diverticulosis    Essential  hypertension    GERD (gastroesophageal reflux disease)    Glaucoma    History of breast cancer    Right - s/p XRT 1984, surgery, no chemo   Hyperlipidemia    Personal history of radiation therapy    Rotator cuff syndrome, left    Scoliosis    Urinary incontinence     Past Surgical History:  Procedure Laterality Date   [place]   right breast   COLONOSCOPY  2008   Dr. Oletta Lamas: diverticulosis, hemorrhoids, conscious sedation   COLONOSCOPY N/A 01/30/2016   Dr. Gala Romney: mild divertiuculosis in sigmoid colon. narrowing of the colon in association with the deverticular opening. 6 polyps removed, tubular adenoma. next TCS in 3 years   COLONOSCOPY WITH PROPOFOL N/A 04/17/2021   one 4 mm polyp at splenic flexure, otherwise normal. Tubular adenoma.   CORONARY ANGIOPLASTY WITH STENT PLACEMENT  02/2008   CORONARY BALLOON ANGIOPLASTY N/A 08/17/2021   Procedure: CORONARY BALLOON ANGIOPLASTY;  Surgeon: Troy Sine, MD;  Location: [place] CV LAB;  Service: Cardiovascular;  Laterality: N/A;   ESOPHAGOGASTRODUODENOSCOPY  2014   Dr. Fuller Plan: erosive gastritis, no h.pylori   ESOPHAGOGASTRODUODENOSCOPY N/A 01/30/2016   Dr. Gala Romney: normal esophagus s/p dilation, small hh.   ESOPHAGOGASTRODUODENOSCOPY (EGD) WITH PROPOFOL N/A 04/17/2021   mild Schatzki ring, s/p dilation. Small hiatal hernia. Normal duodenum.   LEFT HEART  CATH AND CORONARY ANGIOGRAPHY N/A 08/17/2021   Procedure: LEFT HEART CATH AND CORONARY ANGIOGRAPHY;  Surgeon: Troy Sine, MD;  Location: [place] CV LAB;  Service: Cardiovascular;  Laterality: N/A;   LEFT HEART CATHETERIZATION WITH CORONARY ANGIOGRAM N/A 12/02/2012   Procedure: LEFT HEART CATHETERIZATION WITH CORONARY ANGIOGRAM;  Surgeon: Jettie Booze, MD;  Location: [facility] CATH LAB;  Service: Cardiovascular;  Laterality: N/A;   MALONEY DILATION N/A 01/30/2016   Procedure: Venia Minks DILATION;  Surgeon: Daneil Dolin, MD;  Location: [facility];  Service: Endoscopy;  Laterality: N/A;   MALONEY DILATION N/A 04/17/2021   Procedure: Venia Minks DILATION;  Surgeon: Daneil Dolin, MD;  Location: [facility];  Service: Endoscopy;  Laterality: N/A;   NASAL SINUS SURGERY     PARTIAL COLECTOMY N/A 05/19/2018   Procedure: PARTIAL COLECTOMY;  Surgeon: Aviva Signs, MD;  Location: [facility] ORS;  Service: General;  Laterality: N/A;   PERCUTANEOUS CORONARY STENT INTERVENTION (PCI-S)  12/02/2012   Procedure: PERCUTANEOUS CORONARY STENT INTERVENTION (PCI-S);  Surgeon: Jettie Booze, MD;  Location: [facility] CATH LAB;  Service: Cardiovascular;;  DES to the MID LAD   POLYPECTOMY  04/17/2021   Procedure: POLYPECTOMY;  Surgeon: Daneil Dolin, MD;  [facility]: [facility];  Service: Endoscopy;;    Current Medications: Outpatient Medications Prior to Visit  Medication Sig Dispense Refill   amLODipine (NORVASC) 5 MG tablet TAKE 1 TABLET(5 MG) BY MOUTH DAILY 90 tablet 3   aspirin EC 81 MG tablet Take 81 mg by mouth daily.      clopidogrel (PLAVIX) 75 MG tablet Take 1 tablet (75 mg total) by mouth daily with breakfast. 90 tablet 2   isosorbide mononitrate (IMDUR) 60 MG 24 hr tablet TAKE 1 TABLET BY MOUTH DAILY (Patient taking differently: Take 30 mg by mouth daily.) 90 tablet 3   latanoprost (XALATAN) 0.005 % ophthalmic solution Place 1 drop into both eyes at bedtime.      meclizine (ANTIVERT) 25 MG tablet Take 1 tablet (25 mg total) by mouth 3 (three) times daily as needed for dizziness. 30 tablet 0   metoprolol succinate (TOPROL-XL) 25 MG 24 hr tablet TAKE 1 TABLET BY MOUTH DAILY 90 tablet 2   montelukast (SINGULAIR) 10 MG tablet Take 10 mg by mouth daily.     nitroGLYCERIN (NITROSTAT) 0.4 MG SL tablet Place 1 tablet (0.4 mg total) under the tongue every 5 (five) minutes x 3 doses as needed for chest pain. May repeat x3 25 tablet 3   Wheat Dextrin (BENEFIBER PO) Take 1 Dose by mouth daily.     pantoprazole (PROTONIX) 40 MG  tablet TAKE 1 TABLET BY MOUTH DAILY 90 tablet 3   rosuvastatin (CRESTOR) 20 MG tablet Take 1 tablet (20 mg total) by mouth daily. 90 tablet 3   No facility-administered medications prior to visit.     Allergies:   Crestor [rosuvastatin], Lipitor [atorvastatin], Lisinopril, and Simvastatin   Social History   Socioeconomic History   Marital status: Married    Spouse name: Not on file   Number of children: 1   Years of education: Not on file   Highest education level: Not on file  Occupational History   Occupation: hair stylist   Occupation: COSMETOLOGY    Employer: [employer]  Tobacco Use   Smoking status: Never   Smokeless tobacco: Never   Tobacco comments:    Widowed, lives alone but has sig other.   Vaping Use   Vaping Use: Never  used  Substance and Sexual Activity   Alcohol use: Not Currently   Drug use: No   Sexual activity: Yes    Birth control/protection: Post-menopausal  Other Topics Concern   Not on file  Social History Narrative   Widowed, husband died.    Lives alone but has significant other, married in Jan 14, 2016 to [place].    Had one son, but he passed away.    Retired as Probation officer - [employer] and part-time at [employer]   Son died in 2016/01/14 of AMI. Has two grandsons.       Social Determinants of Health   Financial Resource Strain: Not on file  Food Insecurity: Not on file  Transportation Needs: Not on file  Physical Activity: Not on file  Stress: Not on file  Social Connections: Not on file     Family History:  The patient's family history includes Emphysema in her brother and father; Heart attack in her brother, sister, and son; Heart disease in her brother; Lung cancer in her brother; Throat cancer in her brother.   Review of Systems:    Please see the history of present illness.     All other systems reviewed and are otherwise negative except as noted above.   Physical Exam:    VS:  BP 132/70    Pulse 68    Ht  5\' 2"  (1.575 m)    Wt 135 lb 3.2 oz (61.3 kg)    SpO2 94%    BMI 24.73 kg/m    General: Well developed, well nourished,female appearing in no acute distress. Head: Normocephalic, atraumatic. Neck: No carotid bruits. JVD not elevated.  Lungs: Respirations regular and unlabored, without wheezes or rales.  Heart: Regular rate and rhythm. No S3 or S4.  No murmur, no rubs, or gallops appreciated. Abdomen: Appears non-distended. No obvious abdominal masses. Msk:  Strength and tone appear normal for age. No obvious joint deformities or effusions. Extremities: No clubbing or cyanosis. No pitting edema.  Distal pedal pulses are 2+ bilaterally. Neuro: Alert and oriented X 3. Moves all extremities spontaneously. No focal deficits noted. Psych:  Responds to questions appropriately with a normal affect. Skin: No rashes or lesions noted  Wt Readings from Last 3 Encounters:  11/08/21 135 lb 3.2 oz (61.3 kg)  09/15/21 135 lb (61.2 kg)  08/17/21 133 lb (60.3 kg)     Studies/Labs Reviewed:   EKG:  EKG is ordered today. The EKG ordered today demonstrates NSR, HR 68 with RBBB and LAFB.  Recent Labs: 08/10/2021: ALT 16 08/18/2021: BUN 11; Creatinine, Ser 0.91; Hemoglobin 11.8; Platelets 222; Potassium 3.9; Sodium 138   Lipid Panel    Component Value Date/Time   CHOL 202 (H) 10/04/2021 0956   TRIG 90 10/04/2021 0956   TRIG 119 05/16/2010 0000   HDL 67 10/04/2021 0956   CHOLHDL 3.0 10/04/2021 0956   VLDL 18 10/04/2021 0956   LDLCALC 117 (H) 10/04/2021 0956   LDLCALC 99 05/05/2019 1124   LDLDIRECT 144.4 11/09/2013 0934    Additional studies/ records that were reviewed today include:   NST: 01-13-2021 No diagnostic ST segment changes to indicate ischemia. Small, mild intensity, mid to basal inferoseptal defect that is partially reversible suggesting mild ischemic territory with possible component of scar. This is a low risk study. Nuclear stress EF: 86%.  LHC: 08/2021 Prox RCA lesion is 90%  stenosed.   Mid RCA lesion is 20% stenosed.   Prox LAD to Mid LAD  lesion is 70% stenosed.   Ost LAD to Prox LAD lesion is 80% stenosed.   Post intervention, there is a 0% residual stenosis.   Post intervention, there is a 0% residual stenosis.   Post intervention, there is a 0% residual stenosis.   The left ventricular systolic function is normal.   LV end diastolic pressure is normal.   The left ventricular ejection fraction is 55-65% by visual estimate.   Two-vessel coronary obstructive disease with in-stent restenosis of the proximal to mid LAD stent with narrowing of 80 and 70%,  and 90% in-stent restenosis of the previously placed proximal to mid RCA stent and a dominant RCA.  The left circumflex coronary artery is a large caliber normal vessel.   Normal LV function with EF estimated 65% without focal segmental wall motion abnormalities.  LVEDP 14 mmHg   Successful two-vessel coronary intervention utilizing a ScorFlex 2.5 x 10 mm cutting balloon and post stent dilatation with a 2.75 x 15 mm [place] balloon utilized in both the RCA and LAD vessels with the in-stent  stenosis in both vessels being reduced to 0% and brisk TIMI-3 flow.   RECOMMENDATION: DAPT for 6 to 12 months.  Medical therapy with optimal blood pressure control aggressive lipid management.  If patient cannot tolerate high potency statin therapy with possible concomitant Zetia, consider PCSK9 inhibition.   Assessment:    1. Chest pain of uncertain etiology   2. Coronary artery disease involving native coronary artery of native heart with other form of angina pectoris ([place])   3. Essential hypertension   4. Hyperlipidemia LDL goal <70      Plan:   In order of problems listed above:  1. Chest Pain with Atypical Features - Her recent chest discomfort seems atypical for a cardiac etiology as it has occurred at rest and improves with OTC antacids. Her prior anginal symptom was dyspnea on exertion and she reports her  breathing has significantly improved since prior catheterization in 08/2021. - Reviewed options with the patient and will plan to titrate Protonix from 40 mg daily to 40 mg BID for 2 weeks and have her resume 40 mg daily afterwards. If symptoms do not improve, may need to consider titration of Imdur and follow-up ischemic evaluation.   2. CAD - She is s/p DES to RCA in 2009, DES to mid-LAD in 2014 and recent cath in 08/2021 showed ISR of LAD and RCA stents which were treated with cutting balloon angioplasty. - Her pain above does seem atypical for a cardiac etiology and will plan to adjust PPI therapy as outlined above but may need to consider further titration of antianginals or ischemic evaluation if symptoms persist. - Continue current medication regimen for now with ASA 81 mg daily, Plavix 75 mg daily, Imdur 60 mg daily and Toprol-XL 25 mg daily. Will reduce Crestor as outlined below secondary to myalgias.  3. HTN - Her BP is well-controlled at 132/70 during today's visit continue current medication regimen with Amlodipine 5 mg daily, Imdur 60 mg daily and Toprol-XL 25 mg daily.  4. HLD - Her LDL was elevated to 117 when checked recently and Crestor was titrated from 10 mg daily to 20 mg daily. She does report worsening myalgias with this. Reviewed options with the patient and will reduce Crestor back to 10 mg daily and add Zetia 10 mg daily. Recheck FLP in 2-3 months. If LDL remains above goal, would recommend referral to the [facility] for consideration of PCSK9 inhibitor  therapy or Leqvio.     Medication Adjustments/Labs and Tests Ordered: Current medicines are reviewed at length with the patient today.  Concerns regarding medicines are outlined above.  Medication changes, Labs and Tests ordered today are listed in the Patient Instructions below. Patient Instructions  Medication Instructions:   Decrease Crestor to 10mg  daily.   Start Zetia 10mg  daily.   Take Protonix 40mg  twice  daily for 2 weeks then resume 40mg  once daily.   *If you need a refill on your cardiac medications before your next appointment, please call your pharmacy*   Lab Work:  FLP in 3 months.   If you have labs (blood work) drawn today and your tests are completely normal, you will receive your results only by: [place] (if you have MyChart) OR A paper copy in the mail If you have any lab test that is abnormal or we need to change your treatment, we will call you to review the results.   Testing/Procedures:  None   Follow-Up: At [facility], you and your health needs are our priority.  As part of our continuing mission to provide you with exceptional heart care, we have created designated Provider Care Teams.  These Care Teams include your primary Cardiologist (physician) and Advanced Practice Providers (APPs -  Physician Assistants and Nurse Practitioners) who all work together to provide you with the care you need, when you need it.  We recommend signing up for the patient portal called "MyChart".  Sign up information is provided on this After Visit Summary.  MyChart is used to connect with patients for Virtual Visits (Telemedicine).  Patients are able to view lab/test results, encounter notes, upcoming appointments, etc.  Non-urgent messages can be sent to your provider as well.   To learn more about what you can do with MyChart, go to [email or web address].    Your next appointment:   Keep scheduled follow-up for 12/15/2021 at 3:00 PM. Can cancel if symptoms have improved and follow-up in 6 months.      Signed, Erma Heritage, PA-C  11/08/2021 5:07 PM    [facility] [address] [place], [place] [address] Phone: [phone] Fax: [phone]

## 2021-11-08 ENCOUNTER — Ambulatory Visit (INDEPENDENT_AMBULATORY_CARE_PROVIDER_SITE_OTHER): Payer: Medicare Other | Admitting: Student

## 2021-11-08 ENCOUNTER — Other Ambulatory Visit: Payer: Self-pay

## 2021-11-08 ENCOUNTER — Encounter: Payer: Self-pay | Admitting: Student

## 2021-11-08 VITALS — BP 132/70 | HR 68 | Ht 62.0 in | Wt 135.2 lb

## 2021-11-08 DIAGNOSIS — I25118 Atherosclerotic heart disease of native coronary artery with other forms of angina pectoris: Secondary | ICD-10-CM | POA: Diagnosis not present

## 2021-11-08 DIAGNOSIS — I10 Essential (primary) hypertension: Secondary | ICD-10-CM

## 2021-11-08 DIAGNOSIS — E785 Hyperlipidemia, unspecified: Secondary | ICD-10-CM | POA: Diagnosis not present

## 2021-11-08 DIAGNOSIS — R079 Chest pain, unspecified: Secondary | ICD-10-CM

## 2021-11-08 MED ORDER — EZETIMIBE 10 MG PO TABS: 10.0000 mg | ORAL_TABLET | Freq: Every day | ORAL | 3 refills | Status: DC

## 2021-11-08 MED ORDER — PANTOPRAZOLE SODIUM 40 MG PO TBEC: DELAYED_RELEASE_TABLET | ORAL | 1 refills | Status: DC

## 2021-11-08 MED ORDER — ROSUVASTATIN CALCIUM 10 MG PO TABS: 10.0000 mg | ORAL_TABLET | Freq: Every day | ORAL | 3 refills | Status: DC

## 2021-11-08 NOTE — Patient Instructions (Signed)
Medication Instructions:   Decrease Crestor to 10mg  daily.   Start Zetia 10mg  daily.   Take Protonix 40mg  twice daily for 2 weeks then resume 40mg  once daily.   *If you need a refill on your cardiac medications before your next appointment, please call your pharmacy*   Lab Work:  FLP in 3 months.   If you have labs (blood work) drawn today and your tests are completely normal, you will receive your results only by: [place] (if you have MyChart) OR A paper copy in the mail If you have any lab test that is abnormal or we need to change your treatment, we will call you to review the results.   Testing/Procedures:  None   Follow-Up: At [facility], you and your health needs are our priority.  As part of our continuing mission to provide you with exceptional heart care, we have created designated Provider Care Teams.  These Care Teams include your primary Cardiologist (physician) and Advanced Practice Providers (APPs -  Physician Assistants and Nurse Practitioners) who all work together to provide you with the care you need, when you need it.  We recommend signing up for the patient portal called "MyChart".  Sign up information is provided on this After Visit Summary.  MyChart is used to connect with patients for Virtual Visits (Telemedicine).  Patients are able to view lab/test results, encounter notes, upcoming appointments, etc.  Non-urgent messages can be sent to your provider as well.   To learn more about what you can do with MyChart, go to [email or web address].    Your next appointment:   Keep scheduled follow-up for 12/15/2021 at 3:00 PM. Can cancel if symptoms have improved and follow-up in 6 months.

## 2021-11-16 ENCOUNTER — Encounter: Payer: Self-pay | Admitting: Internal Medicine

## 2021-11-29 ENCOUNTER — Telehealth: Payer: Self-pay | Admitting: Cardiology

## 2021-11-29 NOTE — Telephone Encounter (Signed)
° °  Patient Name: Dorothy Lee  DOB: 1941/02/10 MRN: [id number]  Primary Cardiologist: [name], MD  Chart reviewed as part of pre-operative protocol coverage.   Simple dental extractions (I.e. 1-2 teeth under local anesthesia) are considered low risk procedures per guidelines and generally do not require any specific cardiac clearance. It is also generally accepted that for simple extractions and dental cleanings, there is no need to interrupt blood thinner therapy. SBE prophylaxis is not required for the patient from a cardiac standpoint.  I spoke with Lattie Haw @ dental office and will route this recommendation to the requesting party via [place] fax function and remove from pre-op pool. Fax # clarified as [phone].   Please call with questions.  Charlie Pitter, PA-C 11/29/2021, 12:17 PM

## 2021-11-29 NOTE — Telephone Encounter (Signed)
° °  Pre-operative Risk Assessment    Patient Name: Dorothy Lee  DOB: 1940-11-14 MRN: [id number]     Request for Surgical Clearance    Procedure:  Dental Extraction - Amount of Teeth to be Pulled:  1   Date of Surgery:  Clearance 11/29/21                                 Surgeon:  Dr. Elinor Dodge Surgeon's Group or Practice Name:   Phone number:  [phone] Fax number:     Type of Clearance Requested:   - Medical    Type of Anesthesia:  Not Indicated   Additional requests/questions:    [name]   11/29/2021, 12:06 PM

## 2021-12-15 ENCOUNTER — Ambulatory Visit: Payer: Medicare Other | Admitting: Student

## 2022-02-14 ENCOUNTER — Ambulatory Visit: Payer: Medicare Other | Admitting: Cardiology

## 2022-02-21 ENCOUNTER — Other Ambulatory Visit (HOSPITAL_COMMUNITY): Payer: Self-pay | Admitting: Family Medicine

## 2022-02-21 DIAGNOSIS — Z1231 Encounter for screening mammogram for malignant neoplasm of breast: Secondary | ICD-10-CM

## 2022-02-21 DIAGNOSIS — Z780 Asymptomatic menopausal state: Secondary | ICD-10-CM

## 2022-03-02 ENCOUNTER — Other Ambulatory Visit: Payer: Self-pay | Admitting: Cardiology

## 2022-03-06 ENCOUNTER — Ambulatory Visit (INDEPENDENT_AMBULATORY_CARE_PROVIDER_SITE_OTHER): Payer: Medicare Other | Admitting: Gastroenterology

## 2022-03-06 ENCOUNTER — Encounter: Payer: Self-pay | Admitting: Gastroenterology

## 2022-03-06 VITALS — BP 120/72 | HR 75 | Temp 97.5°F | Ht 62.0 in | Wt 137.2 lb

## 2022-03-06 DIAGNOSIS — I25118 Atherosclerotic heart disease of native coronary artery with other forms of angina pectoris: Secondary | ICD-10-CM

## 2022-03-06 DIAGNOSIS — R1319 Other dysphagia: Secondary | ICD-10-CM | POA: Diagnosis not present

## 2022-03-06 DIAGNOSIS — K5900 Constipation, unspecified: Secondary | ICD-10-CM | POA: Diagnosis not present

## 2022-03-06 DIAGNOSIS — K219 Gastro-esophageal reflux disease without esophagitis: Secondary | ICD-10-CM | POA: Diagnosis not present

## 2022-03-06 NOTE — Patient Instructions (Signed)
Continue Benefiber daily. Add Miralax one capful in 8 ounces of water daily. You can actually increase Benefiber up to three times a day if tolerated!  I am arranging a special xray of your esophagus.  We will see you in 3 months!  Further recommendations after the xray  I enjoyed seeing you again today! As you know, I value our relationship and want to provide genuine, compassionate, and quality care. I welcome your feedback. If you receive a survey regarding your visit,  I greatly appreciate you taking time to fill this out. See you next time!  Annitta Needs, PhD, ANP-BC [facility]

## 2022-03-06 NOTE — Progress Notes (Signed)
Gastroenterology Office Note     Primary Care Physician:  [name], MD  Primary Gastroenterologist: Dr. Gala Romney    Chief Complaint   Chief Complaint  Patient presents with   Dysphagia    Pt states it is getting worse near the beginning of her throat     History of Present Illness   Dorothy Lee is an 81 y.o. female presenting today in follow-up with a history of constipation, GERD  perforated diverticulitis Jan 2019, undergoing elective colectomy in Aug 2019. She has a history of multiple polyps, with surveillance just completed in July 2022 with one tubular adenoma. No further surveillance due to age. She also underwent EGD July 2022 with mild Schatzki ring, s/p dilation. Small hiatal hernia. Normal duodenum.   In interim from last visit, she had cardiac cath Nov 2022 due to worsening fatigue and DOE and found to have  two-vessel obstructive coronary disease with in-stent restenosis of the proximal/mid LAD stent of 80% along with 90% in-stent stenosis of the proximal/mid RCA stent.  Angioplasty performed. She is on Plavix and aspirin.    Gets choked easily on food. Feels like worsening now. Has issues with peanut butter, bread, meats.    Feeling soreness in lower abdomen. Sometimes feels swollen. Takes 2 heaping teaspoons of Benefiber every morning. Doesn't seem to help as much. Just started in the past few weeks.     Past Medical History:  Diagnosis Date   Allergic rhinitis    CAD (coronary artery disease)    a. s/p DES to RCA in 2009 b.  DES to mid-LAD in 2014 c. 08/2021: ISR of LAD and RCA stents treated with cutting balloon angioplasty   Cancer [facility])    Diverticulitis    Diverticulosis    Essential hypertension    GERD (gastroesophageal reflux disease)    Glaucoma    History of breast cancer    Right - s/p XRT 1984, surgery, no chemo   Hyperlipidemia    Personal history of radiation therapy    Rotator cuff syndrome, left    Scoliosis    Urinary  incontinence     Past Surgical History:  Procedure Laterality Date   [place]   right breast   COLONOSCOPY  2008   Dr. Oletta Lamas: diverticulosis, hemorrhoids, conscious sedation   COLONOSCOPY N/A 01/30/2016   Dr. Gala Romney: mild divertiuculosis in sigmoid colon. narrowing of the colon in association with the deverticular opening. 6 polyps removed, tubular adenoma. next TCS in 3 years   COLONOSCOPY WITH PROPOFOL N/A 04/17/2021   one 4 mm polyp at splenic flexure, otherwise normal. Tubular adenoma.   CORONARY ANGIOPLASTY WITH STENT PLACEMENT  02/2008   CORONARY BALLOON ANGIOPLASTY N/A 08/17/2021   Procedure: CORONARY BALLOON ANGIOPLASTY;  Surgeon: Troy Sine, MD;  Location: [place] CV LAB;  Service: Cardiovascular;  Laterality: N/A;   ESOPHAGOGASTRODUODENOSCOPY  2014   Dr. Fuller Plan: erosive gastritis, no h.pylori   ESOPHAGOGASTRODUODENOSCOPY N/A 01/30/2016   Dr. Gala Romney: normal esophagus s/p dilation, small hh.   ESOPHAGOGASTRODUODENOSCOPY (EGD) WITH PROPOFOL N/A 04/17/2021   mild Schatzki ring, s/p dilation. Small hiatal hernia. Normal duodenum.   LEFT HEART CATH AND CORONARY ANGIOGRAPHY N/A 08/17/2021   Procedure: LEFT HEART CATH AND CORONARY ANGIOGRAPHY;  Surgeon: Troy Sine, MD;  Location: [place] CV LAB;  Service: Cardiovascular;  Laterality: N/A;   LEFT HEART CATHETERIZATION WITH CORONARY ANGIOGRAM N/A  12/02/2012   Procedure: LEFT HEART CATHETERIZATION WITH CORONARY ANGIOGRAM;  Surgeon: Jettie Booze, MD;  Location: [facility] C[facility] LAB;  Service: Cardiovascular;  Laterality: N/A;   MALONEY DILATION N/A 01/30/2016   Procedure: Venia Minks DILATION;  Surgeon: Daneil Dolin, MD;  [facility];  Service: Endoscopy;  Laterality: N/A;   MALONEY DILATION N/A 04/17/2021   Procedure: Venia Minks DILATION;  Surgeon: Daneil Dolin, MD;  [facility];  Service: Endoscopy;  Laterality: N/A;   NASAL SINUS SURGERY      PARTIAL COLECTOMY N/A 05/19/2018   Procedure: PARTIAL COLECTOMY;  Surgeon: Aviva Signs, MD;  Location: [facility];  Service: General;  Laterality: N/A;   PERCUTANEOUS CORONARY STENT INTERVENTION (PCI-S)  12/02/2012   Procedure: PERCUTANEOUS CORONARY STENT INTERVENTION (PCI-S);  Surgeon: Jettie Booze, MD;  Location: [facility] CATH LAB;  Service: Cardiovascular;;  DES to the MID LAD   POLYPECTOMY  04/17/2021   Procedure: POLYPECTOMY;  Surgeon: Daneil Dolin, MD;  [facility];  Service: Endoscopy;;    Current Outpatient Medications  Medication Sig Dispense Refill   amLODipine (NORVASC) 5 MG tablet TAKE 1 TABLET(5 MG) BY MOUTH DAILY 90 tablet 3   aspirin EC 81 MG tablet Take 81 mg by mouth daily.      clopidogrel (PLAVIX) 75 MG tablet Take 1 tablet (75 mg total) by mouth daily with breakfast. 90 tablet 2   isosorbide mononitrate (IMDUR) 60 MG 24 hr tablet TAKE 1 TABLET BY MOUTH DAILY 90 tablet 3   latanoprost (XALATAN) 0.005 % ophthalmic solution Place 1 drop into both eyes at bedtime.      meclizine (ANTIVERT) 25 MG tablet Take 1 tablet (25 mg total) by mouth 3 (three) times daily as needed for dizziness. 30 tablet 0   meloxicam (MOBIC) 15 MG tablet Take 15 mg by mouth daily.     metoprolol succinate (TOPROL-XL) 25 MG 24 hr tablet TAKE 1 TABLET BY MOUTH DAILY 90 tablet 2   montelukast (SINGULAIR) 10 MG tablet Take 10 mg by mouth daily.     nitroGLYCERIN (NITROSTAT) 0.4 MG SL tablet Place 1 tablet (0.4 mg total) under the tongue every 5 (five) minutes x 3 doses as needed for chest pain. May repeat x3 25 tablet 3   pantoprazole (PROTONIX) 40 MG tablet Take '40mg'$  twice daily for 2 weeks then go back to '40mg'$  once daily. 90 tablet 1   rosuvastatin (CRESTOR) 10 MG tablet Take 1 tablet (10 mg total) by mouth daily. 90 tablet 3   Wheat Dextrin (BENEFIBER PO) Take 1 Dose by mouth daily.     ezetimibe (ZETIA) 10 MG tablet Take 1 tablet (10 mg total) by mouth daily. 30 tablet 3   No current  facility-administered medications for this visit.    Allergies as of 03/06/2022 - Review Complete 03/06/2022  Allergen Reaction Noted   Crestor [rosuvastatin]  08/11/2013   Lipitor [atorvastatin]  08/11/2013   Lisinopril Cough 02/16/2013   Simvastatin  08/11/2013    Family History  Problem Relation Age of Onset   Emphysema Father    Emphysema Brother    Heart disease Brother        x3 brothers   Lung cancer Brother    Throat cancer Brother        head/neck cancer   Heart attack Brother    Heart attack Sister    Heart attack Son        blood clot   Colon cancer Neg Hx  Social History   Socioeconomic History   Marital status: Married    Spouse name: Not on file   Number of children: 1   Years of education: Not on file   Highest education level: Not on file  Occupational History   Occupation: hair stylist   Occupation: COSMETOLOGY    Employer: [employer]  Tobacco Use   Smoking status: Never   Smokeless tobacco: Never   Tobacco comments:    Widowed, lives alone but has sig other.   Vaping Use   Vaping Use: Never used  Substance and Sexual Activity   Alcohol use: Not Currently   Drug use: No   Sexual activity: Yes    Birth control/protection: Post-menopausal  Other Topics Concern   Not on file  Social History Narrative   Widowed, husband died.    Lives alone but has significant other, married in 12-23-15 to [place].    Had one son, but he passed away.    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 Has two grandsons.       Social Determinants of Health   Financial Resource Strain: Not on file  Food Insecurity: Not on file  Transportation Needs: Not on file  Physical Activity: Not on file  Stress: Not on file  Social Connections: Not on file  Intimate Partner Violence: Not on file     Review of Systems   Gen: Denies any fever, chills, fatigue, weight loss, lack of  appetite.  CV: Denies chest pain, heart palpitations, peripheral edema, syncope.  Resp: Denies shortness of breath at rest or with exertion. Denies wheezing or cough.  GI: Denies dysphagia or odynophagia. Denies jaundice, hematemesis, fecal incontinence. GU : Denies urinary burning, urinary frequency, urinary hesitancy MS: Denies joint pain, muscle weakness, cramps, or limitation of movement.  Derm: Denies rash, itching, dry skin Psych: Denies depression, anxiety, memory loss, and confusion Heme: Denies bruising, bleeding, and enlarged lymph nodes.   Physical Exam   BP 120/72   Pulse 75   Temp (!) 97.5 F (36.4 C)   Ht '5\' 2"'$  (1.575 m)   Wt 137 lb 3.2 oz (62.2 kg)   BMI 25.09 kg/m  General:   Alert and oriented. Pleasant and cooperative. Well-nourished and well-developed.  Head:  Normocephalic and atraumatic. Eyes:  Without icterus Abdomen:  +BS, soft, non-tender and non-distended. No HSM noted. No guarding or rebound. No masses appreciated.  Rectal:  Deferred  Msk:  Symmetrical without gross deformities. Normal posture. Extremities:  Without edema. Neurologic:  Alert and  oriented x4;  grossly normal neurologically. Skin:  Intact without significant lesions or rashes. Psych:  Alert and cooperative. Normal mood and affect.   Assessment   Dorothy Lee is a 81 y.o. female presenting today in follow-up with a history of  constipation, GERD  perforated diverticulitis Jan 2019, undergoing elective colectomy in Aug 2019. She has a history of multiple polyps, with surveillance just completed in July 2022 with one tubular adenoma. No further surveillance due to age. She also underwent EGD July 2022 with mild Schatzki ring, s/p dilation. Small hiatal hernia. Normal duodenum.    Dysphagia: worsened, s/p EGD in July 2022 with mild Schatzki ring. Will pursue BPE. Continue PPI daily. Query motility disorder. May need repeat EGD.   Lower abdominal discomfort: in setting of constipation. No  alarm signs/symptoms. Continue with Benefiber. Add Miralax daily.  PLAN    BPE Continue PPI Add Miralax to Benefiber regimen Return in 3 months   Annitta Needs, PhD, [facility]astroenter[facility]

## 2022-03-09 ENCOUNTER — Ambulatory Visit (HOSPITAL_COMMUNITY): Payer: Medicare Other

## 2022-03-12 ENCOUNTER — Other Ambulatory Visit: Payer: Medicare Other

## 2022-03-14 ENCOUNTER — Other Ambulatory Visit: Payer: Self-pay | Admitting: Gastroenterology

## 2022-03-14 ENCOUNTER — Ambulatory Visit (HOSPITAL_COMMUNITY)
Admission: RE | Admit: 2022-03-14 | Discharge: 2022-03-14 | Disposition: A | Discharge status: Home / Self Care | Payer: Medicare Other | Source: Ambulatory Visit | Attending: Gastroenterology | Admitting: Gastroenterology

## 2022-03-14 DIAGNOSIS — R1319 Other dysphagia: Secondary | ICD-10-CM | POA: Insufficient documentation

## 2022-03-20 ENCOUNTER — Encounter: Payer: Self-pay | Admitting: Student

## 2022-03-20 ENCOUNTER — Ambulatory Visit (INDEPENDENT_AMBULATORY_CARE_PROVIDER_SITE_OTHER): Payer: Medicare Other | Admitting: Student

## 2022-03-20 VITALS — BP 138/80 | HR 71 | Ht 62.0 in | Wt 135.2 lb

## 2022-03-20 DIAGNOSIS — E785 Hyperlipidemia, unspecified: Secondary | ICD-10-CM | POA: Diagnosis not present

## 2022-03-20 DIAGNOSIS — I10 Essential (primary) hypertension: Secondary | ICD-10-CM

## 2022-03-20 DIAGNOSIS — I25118 Atherosclerotic heart disease of native coronary artery with other forms of angina pectoris: Secondary | ICD-10-CM | POA: Diagnosis not present

## 2022-03-20 NOTE — Patient Instructions (Signed)
Medication Instructions:  You can take Zetia, Toprol XL, and Crestor in the evening- with Supper or Bedtime  Labwork: Fasting Lipid Panel  Testing/Procedures: None  Follow-Up: Follow up with Bernerd Pho, PA-C or Dr. Domenic Polite in 4-5 months.   Any Other Special Instructions Will Be Listed Below (If Applicable).     If you need a refill on your cardiac medications before your next appointment, please call your pharmacy.

## 2022-03-20 NOTE — Progress Notes (Signed)
Cardiology Office Note    Date:  03/20/2022   ID:  Dorothy Lee, DOB [birth year], MRN [id number]  PCP:  [name], MD  Cardiologist: [name], MD    Chief Complaint  Patient presents with   Follow-up    4 month visit    History of Present Illness:    Dorothy Lee is a 81 y.o. female  with past medical history of CAD (s/p DES to RCA in 2009, DES to mid-LAD in 2014, s/p cath in 08/2021 showing ISR of LAD and RCA stents which were treated with cutting balloon angioplasty), HTN, HLD, GERD and history of breast cancer who presents to the office today for 76-monthfollow-up.  She was last examined by myself in 11/2021 and reported having occasional sternal/epigastric discomfort which would last for a few minutes and spontaneously resolve. Symptoms had improved with antacids and were different from her prior angina. Protonix was titrated from 40 mg daily to 40 mg twice daily for 2 weeks and if symptoms did not improve, may need to consider titration of Imdur or follow-up ischemic evaluation.  She reported worsening myalgias with recently titrated Crestor, therefore this was reduced back to 10 mg daily and she was started on Zetia 10 mg daily.  In talking with the patient today, her biggest concern at this time is that she feels like she is "in a fog" after she takes her morning medications. Says this has been occurring for several months. She does take all of her medications in the morning hours currently. She reports occasional episodes of chest discomfort but feels like this is secondary to acid reflux as symptoms improve with Tums or an extra dose of Protonix. She is being followed by GI as well for this. Denies any exertional chest pain. Her breathing has been stable with no recent dyspnea on exertion, orthopnea, PND or pitting edema.  Past Medical History:  Diagnosis Date   Allergic rhinitis    CAD (coronary artery disease)    a. s/p DES to RCA in 2009 b.  DES to mid-LAD in  2014 c. 08/2021: ISR of LAD and RCA stents treated with cutting balloon angioplasty   [facility]    Diverticulitis    Diverticulosis    Essential hypertension    GERD (gastroesophageal reflux disease)    Glaucoma    History of breast cancer    Right - s/p XRT 1984, surgery, no chemo   Hyperlipidemia    Personal history of radiation therapy    Rotator cuff syndrome, left    Scoliosis    Urinary incontinence     Past Surgical History:  [place]cedure Laterality Date   [place]  right breast   COLONOSCOPY  2008   Dr. EOletta Lamas diverticulosis, hemorrhoids, conscious sedation   COLONOSCOPY N/A 01/30/2016   Dr. RGala Romney mild divertiuculosis in sigmoid colon. narrowing of the colon in association with the deverticular opening. 6 polyps removed, tubular adenoma. next TCS in 3 years   COLONOSCOPY WITH PROPOFOL N/A 04/17/2021   one 4 mm polyp at splenic flexure, otherwise normal. Tubular adenoma.   CORONARY ANGIOPLASTY WITH STENT PLACEMENT  02/2008   CORONARY BALLOON ANGIOPLASTY N/A 08/17/2021   Procedure: CORONARY BALLOON ANGIOPLASTY;  Surgeon: KTroy Sine MD;  [facility];  Service: Cardiovascular;  Laterality: N/A;   ESOPHAGOGASTRODUODENOSCOPY  2014   Dr. SFuller Plan erosive gastritis, no h.pylori  ESOPHAGOGASTRODUODENOSCOPY N/A 01/30/2016   Dr. Gala Romney: normal esophagus s/p dilation, small hh.   ESOPHAGOGASTRODUODENOSCOPY (EGD) WITH PROPOFOL N/A 04/17/2021   mild Schatzki ring, s/p dilation. Small hiatal hernia. Normal duodenum.   LEFT HEART CATH AND CORONARY ANGIOGRAPHY N/A 08/17/2021   Procedure: LEFT HEART CATH AND CORONARY ANGIOGRAPHY;  Surgeon: Troy Sine, MD;  Location: [place] CV LAB;  Service: Cardiovascular;  Laterality: N/A;   LEFT HEART CATHETERIZATION WITH CORONARY ANGIOGRAM N/A 12/02/2012   Procedure: LEFT HEART CATHETERIZATION WITH CORONARY ANGIOGRAM;  Surgeon: Jettie Booze, MD;   Location: [facility] CATH LAB;  Service: Cardiovascular;  Laterality: N/A;   MALONEY DILATION N/A 01/30/2016   Procedure: Venia Minks DILATION;  Surgeon: Daneil Dolin, MD;  [facility];  Service: Endoscopy;  Laterality: N/A;   MALONEY DILATION N/A 04/17/2021   Procedure: Venia Minks DILATION;  Surgeon: Daneil Dolin, MD;  [facility];  Service: Endoscopy;  Laterality: N/A;   NASAL SINUS SURGERY     PARTIAL COLECTOMY N/A 05/19/2018   Procedure: PARTIAL COLECTOMY;  Surgeon: Aviva Signs, MD;  Location: [facility];  Service: General;  Laterality: N/A;   PERCUTANEOUS CORONARY STENT INTERVENTION (PCI-S)  12/02/2012   Procedure: PERCUTANEOUS CORONARY STENT INTERVENTION (PCI-S);  Surgeon: Jettie Booze, MD;  Location: [facility] CATH LAB;  Service: Cardiovascular;;  DES to the MID LAD   POLYPECTOMY  04/17/2021   Procedure: POLYPECTOMY;  Surgeon: Daneil Dolin, MD;  [facility];  Service: Endoscopy;;    Current Medications: Outpatient Medications Prior to Visit  Medication Sig Dispense Refill   amLODipine (NORVASC) 5 MG tablet TAKE 1 TABLET(5 MG) BY MOUTH DAILY 90 tablet 3   aspirin EC 81 MG tablet Take 81 mg by mouth daily.      Baclofen 5 MG TABS Take 1 tablet by mouth daily.     clopidogrel (PLAVIX) 75 MG tablet Take 1 tablet (75 mg total) by mouth daily with breakfast. 90 tablet 2   ezetimibe (ZETIA) 10 MG tablet Take 1 tablet (10 mg total) by mouth daily. 30 tablet 3   isosorbide mononitrate (IMDUR) 60 MG 24 hr tablet TAKE 1 TABLET BY MOUTH DAILY 90 tablet 3   latanoprost (XALATAN) 0.005 % ophthalmic solution Place 1 drop into both eyes at bedtime.      meclizine (ANTIVERT) 25 MG tablet Take 1 tablet (25 mg total) by mouth 3 (three) times daily as needed for dizziness. 30 tablet 0   meloxicam (MOBIC) 15 MG tablet Take 15 mg by mouth daily.     metoprolol succinate (TOPROL-XL) 25 MG 24 hr tablet TAKE 1 TABLET BY MOUTH DAILY 90 tablet 2   montelukast (SINGULAIR) 10 MG  tablet Take 10 mg by mouth daily.     nitroGLYCERIN (NITROSTAT) 0.4 MG SL tablet Place 1 tablet (0.4 mg total) under the tongue every 5 (five) minutes x 3 doses as needed for chest pain. May repeat x3 25 tablet 3   pantoprazole (PROTONIX) 40 MG tablet Take '40mg'$  twice daily for 2 weeks then go back to '40mg'$  once daily. 90 tablet 1   rosuvastatin (CRESTOR) 10 MG tablet Take 1 tablet (10 mg total) by mouth daily. 90 tablet 3   Wheat Dextrin (BENEFIBER PO) Take 1 Dose by mouth daily.     No facility-administered medications prior to visit.     Allergies:   Crestor [rosuvastatin], Lipitor [atorvastatin], Lisinopril, and Simvastatin   Social History   Socioeconomic History   Marital status: Married  Spouse name: Not on file   Number of children: 1   Years of education: Not on file   Highest education level: Not on file  Occupational History   Occupation: hair stylist   Occupation: COSMETOLOGY    Employer: [employer]  Tobacco Use   Smoking status: Never   Smokeless tobacco: Never   Tobacco comments:    Widowed, lives alone but has sig other.   Vaping Use   Vaping Use: Never used  Substance and Sexual Activity   Alcohol use: Not Currently   Drug use: No   Sexual activity: Yes    Birth control/protection: Post-menopausal  Other Topics Concern   Not on file  Social History Narrative   Widowed, husband died.    Lives alone but has significant other, married in 2015-12-24 to [place].    Had one son, but he passed away.    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 Has two grandsons.       Social Determinants of Health   Financial Resource Strain: Not on file  Food Insecurity: Not on file  Transportation Needs: Not on file  Physical Activity: Not on file  Stress: Not on file  Social Connections: Not on file     Family History:  The patient's family history includes Emphysema in her brother and father;  Heart attack in her brother, sister, and son; Heart disease in her brother; Lung cancer in her brother; Throat cancer in her brother.   Review of Systems:    Please see the history of present illness.     All other systems reviewed and are otherwise negative except as noted above.   Physical Exam:    VS:  BP 138/80   Pulse 71   Ht '5\' 2"'$  (1.575 m)   Wt 135 lb 3.2 oz (61.3 kg)   SpO2 98%   BMI 24.73 kg/m    General: Well developed, well nourished,female appearing in no acute distress. Head: Normocephalic, atraumatic. Neck: No carotid bruits. JVD not elevated.  Lungs: Respirations regular and unlabored, without wheezes or rales.  Heart: Regular rate and rhythm. No S3 or S4.  No murmur, no rubs, or gallops appreciated. Abdomen: Appears non-distended. No obvious abdominal masses. Msk:  Strength and tone appear normal for age. No obvious joint deformities or effusions. Extremities: No clubbing or cyanosis. No pitting edema.  Distal pedal pulses are 2+ bilaterally. Neuro: Alert and oriented X 3. Moves all extremities spontaneously. No focal deficits noted. Psych:  Responds to questions appropriately with a normal affect. Skin: No rashes or lesions noted  Wt Readings from Last 3 Encounters:  03/20/22 135 lb 3.2 oz (61.3 kg)  03/06/22 137 lb 3.2 oz (62.2 kg)  11/08/21 135 lb 3.2 oz (61.3 kg)     Studies/Labs Reviewed:   EKG:  EKG is not ordered today.   Recent Labs: 08/10/2021: ALT 16 08/18/2021: BUN 11; Creatinine, Ser 0.91; Hemoglobin 11.8; Platelets 222; Potassium 3.9; Sodium 138   Lipid Panel    Component Value Date/Time   CHOL 202 (H) 10/04/2021 0956   TRIG 90 10/04/2021 0956   TRIG 119 05/16/2010 0000   HDL 67 10/04/2021 0956   CHOLHDL 3.0 10/04/2021 0956   VLDL 18 10/04/2021 0956   LDLCALC 117 (H) 10/04/2021 0956   LDLCALC 99 05/05/2019 1124   LDLDIRECT 144.4 11/09/2013 0934    Additional studies/ records that were  reviewed today include:   LHC: 08/2021  Prox  RCA lesion is 90% stenosed.   Mid RCA lesion is 20% stenosed.   Prox LAD to Mid LAD lesion is 70% stenosed.   Ost LAD to Prox LAD lesion is 80% stenosed.   Post intervention, there is a 0% residual stenosis.   Post intervention, there is a 0% residual stenosis.   Post intervention, there is a 0% residual stenosis.   The left ventricular systolic function is normal.   LV end diastolic pressure is normal.   The left ventricular ejection fraction is 55-65% by visual estimate.   Two-vessel coronary obstructive disease with in-stent restenosis of the proximal to mid LAD stent with narrowing of 80 and 70%,  and 90% in-stent restenosis of the previously placed proximal to mid RCA stent and a dominant RCA.  The left circumflex coronary artery is a large caliber normal vessel.   Normal LV function with EF estimated 65% without focal segmental wall motion abnormalities.  LVEDP 14 mmHg   Successful two-vessel coronary intervention utilizing a ScorFlex 2.5 x 10 mm cutting balloon and post stent dilatation with a 2.75 x 15 mm [place] balloon utilized in both the RCA and LAD vessels with the in-stent  stenosis in both vessels being reduced to 0% and brisk TIMI-3 flow.   RECOMMENDATION: DAPT for 6 to 12 months.  Medical therapy with optimal blood pressure control aggressive lipid management.  If patient cannot tolerate high potency statin therapy with possible concomitant Zetia, consider PCSK9 inhibition.  Assessment:    1. Coronary artery disease involving native coronary artery of native heart with other form of angina pectoris ([place])   2. Essential hypertension   3. Hyperlipidemia LDL goal <70      Plan:   In order of problems listed above:  1. CAD - She is s/p DES to RCA in 2009, DES to mid-LAD in 2014 and cath in 08/2021 showed ISR of LAD and RCA stents which were treated with cutting balloon angioplasty. She reports occasional chest discomfort which improves with antacids but no exertional pain and  says her breathing has been stable. - Will continue current medical therapy with ASA 81 mg daily, Plavix 75 mg daily, Imdur 60 mg daily, Toprol-XL 25 mg daily, Crestor 10 mg daily and Zetia 10 mg daily.  2. HTN - Her BP is well controlled at 138/80 during today's visit. She does report feeling like she is "in a fog" after taking her morning medications and I did recommend that she start taking her Crestor and Zetia in the evening hours and will also take Toprol-XL in the evening hours. If symptoms do not improve, would try dose adjustment of Toprol-XL. Continue Amlodipine, Imdur and Toprol-XL at current dosing for now.  3. HLD - She was previously intolerant to higher doses of Crestor and has resumed 10 mg daily but was started on Zetia 10 mg daily at the time of her last office visit and has overall tolerated well. Will recheck a repeat FLP.  If LDL is not at goal, would refer to the Lipid Clinic for PCSK9 inhibitor therapy.    Medication Adjustments/Labs and Tests Ordered: Current medicines are reviewed at length with the patient today.  Concerns regarding medicines are outlined above.  Medication changes, Labs and Tests ordered today are listed in the Patient Instructions below. Patient Instructions  Medication Instructions:  You can take Zetia, Toprol XL, and Crestor in the evening- with Supper or Bedtime  Labwork: Fasting  Lipid Panel  Testing/Procedures: None  Follow-Up: Follow up with Bernerd Pho, PA-C or Dr. Domenic Polite in 4-5 months.   Any Other Special Instructions Will Be Listed Below (If Applicable).     If you need a refill on your cardiac medications before your next appointment, please call your pharmacy.    Signed, Erma Heritage, PA-C  03/20/2022 5:32 PM    [facility] [address] [place], [place] [address] Phone: [phone] Fax: [phone]

## 2022-03-26 ENCOUNTER — Other Ambulatory Visit (HOSPITAL_COMMUNITY): Payer: Self-pay | Admitting: Family Medicine

## 2022-03-26 DIAGNOSIS — Z1231 Encounter for screening mammogram for malignant neoplasm of breast: Secondary | ICD-10-CM

## 2022-03-28 ENCOUNTER — Other Ambulatory Visit (HOSPITAL_COMMUNITY): Payer: Self-pay | Admitting: Family Medicine

## 2022-03-28 DIAGNOSIS — N644 Mastodynia: Secondary | ICD-10-CM

## 2022-03-29 ENCOUNTER — Ambulatory Visit (HOSPITAL_COMMUNITY)
Admission: RE | Admit: 2022-03-29 | Discharge: 2022-03-29 | Disposition: A | Discharge status: Home / Self Care | Payer: Medicare Other | Source: Ambulatory Visit | Attending: Family Medicine | Admitting: Family Medicine

## 2022-03-29 ENCOUNTER — Encounter: Payer: Self-pay | Admitting: Gastroenterology

## 2022-03-29 ENCOUNTER — Encounter (HOSPITAL_COMMUNITY): Payer: Self-pay

## 2022-03-29 ENCOUNTER — Telehealth: Payer: Self-pay | Admitting: Student

## 2022-03-29 DIAGNOSIS — N644 Mastodynia: Secondary | ICD-10-CM

## 2022-03-29 MED ORDER — AMLODIPINE BESYLATE 2.5 MG PO TABS: 2.5000 mg | ORAL_TABLET | Freq: Every day | ORAL | 11 refills | Status: DC

## 2022-03-29 NOTE — Telephone Encounter (Signed)
     She has been on this for several years so it would be odd for her to start having these side effects to it now. Would have her cut in half and take 2.5 mg daily to see if symptoms improve prior to stopping altogether at this time.   Signed, Erma Heritage, PA-C 03/29/2022, 11:23 AM Pager: [phone]

## 2022-03-29 NOTE — Telephone Encounter (Signed)
Pt notified of dose decrease and order placed.

## 2022-03-29 NOTE — Telephone Encounter (Signed)
Spoke with pt and states that she has been having dizziness, lightheaded and some swelling. She states that she reviewed the side effects of Amlodipine and feels the she has most of them. Pt states that her husband was on Amlodipine and was taken off d/t side effects.  Please advise.

## 2022-03-29 NOTE — Telephone Encounter (Signed)
Pt c/o medication issue:  1. Name of Medication:   amLODipine (NORVASC) 5 MG tablet    2. How are you currently taking this medication (dosage and times per day)? TAKE 1 TABLET(5 MG) BY MOUTH DAILY  3. Are you having a reaction (difficulty breathing--STAT)? No  4. What is your medication issue? Pt states that medication is causing her to have dizziness as well as swelling in her left leg and hands. Pt would like a call back. Please advise

## 2022-04-03 ENCOUNTER — Telehealth: Payer: Self-pay | Admitting: Cardiology

## 2022-04-03 NOTE — Telephone Encounter (Signed)
   Pre-operative Risk Assessment    Patient Name: Dorothy Lee  DOB: 09/07/1941 MRN: [id number]      Request for Surgical Clearance    Procedure:   Right Lumbar Nerve 1 level  Date of Surgery:  Clearance TBD                                 Surgeon:  Dr. Florentina Jenny Group or Practice Name:  [facility] Phone number:  [phone] Fax number:  [phone]   Type of Clearance Requested:   - Pharmacy:  Hold Clopidogrel (Plavix) for 7 days   Type of Anesthesia:  Not Indicated   Additional requests/questions:    Sallyanne Havers   04/03/2022, 4:13 PM

## 2022-04-05 NOTE — Telephone Encounter (Signed)
1st attempt to reach pt regarding surgical clearance and the need of a tele-visit, left a message to call back and ask for the preop team.

## 2022-04-06 NOTE — Telephone Encounter (Signed)
Left message to call back for tele visit for pre op

## 2022-04-09 NOTE — Telephone Encounter (Signed)
I s/w the pt and she tells me she is not going to have the surgery. I thanked the pt for her help and I will make note of this. We do have a f/u with the pt in Sept with [place]. Pt thanked  me for the call./

## 2022-04-13 ENCOUNTER — Other Ambulatory Visit: Payer: Self-pay | Admitting: Student

## 2022-04-13 ENCOUNTER — Other Ambulatory Visit: Payer: Self-pay | Admitting: Cardiology

## 2022-04-13 DIAGNOSIS — I10 Essential (primary) hypertension: Secondary | ICD-10-CM

## 2022-04-18 ENCOUNTER — Telehealth (HOSPITAL_BASED_OUTPATIENT_CLINIC_OR_DEPARTMENT_OTHER): Payer: Self-pay

## 2022-04-18 ENCOUNTER — Telehealth: Payer: Self-pay | Admitting: Cardiology

## 2022-04-18 NOTE — Telephone Encounter (Signed)
Error

## 2022-04-18 NOTE — Telephone Encounter (Signed)
error 

## 2022-04-20 ENCOUNTER — Encounter (HOSPITAL_BASED_OUTPATIENT_CLINIC_OR_DEPARTMENT_OTHER): Payer: Self-pay | Admitting: Family

## 2022-04-20 ENCOUNTER — Ambulatory Visit (INDEPENDENT_AMBULATORY_CARE_PROVIDER_SITE_OTHER): Payer: Medicare Other | Admitting: Family

## 2022-04-20 ENCOUNTER — Telehealth (HOSPITAL_BASED_OUTPATIENT_CLINIC_OR_DEPARTMENT_OTHER): Payer: Self-pay | Admitting: Family

## 2022-04-20 VITALS — BP 176/90 | HR 59 | Ht 62.0 in | Wt 136.9 lb

## 2022-04-20 DIAGNOSIS — I25118 Atherosclerotic heart disease of native coronary artery with other forms of angina pectoris: Secondary | ICD-10-CM | POA: Diagnosis not present

## 2022-04-20 DIAGNOSIS — E785 Hyperlipidemia, unspecified: Secondary | ICD-10-CM | POA: Diagnosis not present

## 2022-04-20 DIAGNOSIS — K219 Gastro-esophageal reflux disease without esophagitis: Secondary | ICD-10-CM | POA: Diagnosis not present

## 2022-04-20 DIAGNOSIS — R42 Dizziness and giddiness: Secondary | ICD-10-CM | POA: Diagnosis not present

## 2022-04-20 MED ORDER — ISOSORBIDE MONONITRATE ER 60 MG PO TB24: 30.0000 mg | ORAL_TABLET | Freq: Every day | ORAL | 3 refills | Status: DC

## 2022-04-20 NOTE — Telephone Encounter (Signed)
Spouse (Ralph---on DPR) aware of carotid u/s appt scheduled Thursday 04/26/22 at 12:30 pm at [facility] time is 12:15 pm --1st floor radiology for check in

## 2022-04-20 NOTE — Patient Instructions (Signed)
Medication Instructions:  Your physician has recommended you make the following change in your medication:   DISCONTINUE Amlodipine  REDUCE Isosorbide Mononitrate  to half tablet ('30mg'$ ) once per day  _____________________  Aspirin and Clopidogrel (Plavix) are to protect your stents. Rosuvastatin (Crestor) and Ezetimibe (Zetia) are to protect your stents, lower your cholesterol, prevent plaque build up.  Metoprolol (Toprol) is to help your heart work more efficiently, prevent heart attack.  Isosorbide (Imdur) helps to control your blood pressure, improve blood flow to your heart, and prevent chest pain.  _____________________  Dennis Bast will need to discuss your non-cardiac medications with your primary care provider or back specialist if you wish to discontinue them.   *If you need a refill on your cardiac medications before your next appointment, please call your pharmacy*   Lab Work: None ordered today.   Testing/Procedures: Your EKG today looked good!  Your physician has requested that you have a carotid duplex. This test is an ultrasound of the carotid arteries in your neck. It looks at blood flow through these arteries that supply the brain with blood. Allow one hour for this exam. There are no restrictions or special instructions.   Follow-Up: At [facility], you and your health needs are our priority.  As part of our continuing mission to provide you with exceptional heart care, we have created designated Provider Care Teams.  These Care Teams include your primary Cardiologist (physician) and Advanced Practice Providers (APPs -  Physician Assistants and Nurse Practitioners) who all work together to provide you with the care you need, when you need it.  We recommend signing up for the patient portal called "MyChart".  Sign up information is provided on this After Visit Summary.  MyChart is used to connect with patients for Virtual Visits (Telemedicine).  Patients are able to view  lab/test results, encounter notes, upcoming appointments, etc.  Non-urgent messages can be sent to your provider as well.   To learn more about what you can do with MyChart, go to [email or web address].    Your next appointment:   3 month(s)  The format for your next appointment:   In Person  Provider:   You may see [name], MD or one of the following Advanced Practice Providers on your designated Care Team:   Bernerd Pho, PA-C  Ermalinda Barrios, PA-C    Other Instructions  Tips to Measure your Blood Pressure Correctly  Here's what you can do to ensure a correct reading:  Don't drink a caffeinated beverage or smoke during the 30 minutes before the test.  Sit quietly for five minutes before the test begins.  During the measurement, sit in a chair with your feet on the floor and your arm supported so your elbow is at about heart level.  The inflatable part of the cuff should completely cover at least 80% of your upper arm, and the cuff should be placed on bare skin, not over a shirt.  Don't talk during the measurement.  Blood pressure categories  Blood pressure category SYSTOLIC (upper number)  DIASTOLIC (lower number)  Normal Less than 120 mm Hg and Less than 80 mm Hg  Elevated 120-129 mm Hg and Less than 80 mm Hg  High blood pressure: Stage 1 hypertension 130-139 mm Hg or 80-89 mm Hg  High blood pressure: Stage 2 hypertension 140 mm Hg or higher or 90 mm Hg or higher  Hypertensive crisis (consult your doctor immediately) Higher than 180 mm Hg and/or Higher than 120 mm  Hg  Source: American Heart Association and American Stroke Association. For more on getting your blood pressure under control, buy Controlling Your Blood Pressure, a Special Health Report from [facility].   Blood Pressure Log   Date   Time  Blood Pressure  Example: Nov 1 9 AM 124/78

## 2022-04-20 NOTE — Progress Notes (Signed)
Office Visit    Patient Name: Dorothy Lee Date of Encounter: 04/20/2022  PCP:  [name], [facility]  Cardiologist:  [name], MD  Advanced Practice Provider:  No care team member to display Electrophysiologist:  None      Chief Complaint    Dorothy MCHANEY is a 81 y.o. female presents today for fatigue  Past Medical History    Past Medical History:  Diagnosis Date   Allergic rhinitis    CAD (coronary artery disease)    a. s/p DES to RCA in 2009 b.  DES to mid-LAD in 2014 c. 08/2021: ISR of LAD and RCA stents treated with cutting balloon angioplasty   Cancer [facility])    Diverticulitis    Diverticulosis    Essential hypertension    GERD (gastroesophageal reflux disease)    Glaucoma    History of breast cancer    Right - s/p XRT 1984, surgery, no chemo   Hyperlipidemia    Personal history of radiation therapy    Rotator cuff syndrome, left    Scoliosis    Urinary incontinence    Past Surgical History:  Procedure Laterality Date   [place]   right breast   COLONOSCOPY  2008   Dr. Oletta Lamas: diverticulosis, hemorrhoids, conscious sedation   COLONOSCOPY N/A 01/30/2016   Dr. Gala Romney: mild divertiuculosis in sigmoid colon. narrowing of the colon in association with the deverticular opening. 6 polyps removed, tubular adenoma. next TCS in 3 years   COLONOSCOPY WITH PROPOFOL N/A 04/17/2021   one 4 mm polyp at splenic flexure, otherwise normal. Tubular adenoma.   CORONARY ANGIOPLASTY WITH STENT PLACEMENT  02/2008   CORONARY BALLOON ANGIOPLASTY N/A 08/17/2021   Procedure: CORONARY BALLOON ANGIOPLASTY;  Surgeon: Troy Sine, MD;  Location: [place] [facility];  Service: Cardiovascular;  Laterality: N/A;   ESOPHAGOGASTRODUODENOSCOPY  2014   Dr. Fuller Plan: erosive gastritis, no h.pylori   ESOPHAGOGASTRODUODENOSCOPY N/A 01/30/2016   Dr. Gala Romney: normal esophagus s/p dilation, small hh.    ESOPHAGOGASTRODUODENOSCOPY (EGD) WITH PROPOFOL N/A 04/17/2021   mild Schatzki ring, s/p dilation. Small hiatal hernia. Normal duodenum.   LEFT HEART CATH AND CORONARY ANGIOGRAPHY N/A 08/17/2021   Procedure: LEFT HEART CATH AND CORONARY ANGIOGRAPHY;  Surgeon: Troy Sine, MD;  Location: [place] [facility];  Service: Cardiovascular;  Laterality: N/A;   LEFT HEART CATHETERIZATION WITH CORONARY ANGIOGRAM N/A 12/02/2012   Procedure: LEFT HEART CATHETERIZATION WITH CORONARY ANGIOGRAM;  Surgeon: Jettie Booze, MD;  Location: [facility] CATH LAB;  Service: Cardiovascular;  Laterality: N/A;   MALONEY DILATION N/A 01/30/2016   Procedure: Venia Minks DILATION;  Surgeon: Daneil Dolin, MD;  [facility];  Service: Endoscopy;  Laterality: N/A;   MALONEY DILATION N/A 04/17/2021   Procedure: Venia Minks DILATION;  Surgeon: Daneil Dolin, MD;  [facility]: [facility];  Service: Endoscopy;  Laterality: N/A;   NASAL SINUS SURGERY     PARTIAL COLECTOMY N/A 05/19/2018   Procedure: PARTIAL COLECTOMY;  Surgeon: Aviva Signs, MD;  Location: [facility] ORS;  Service: General;  Laterality: N/A;   PERCUTANEOUS CORONARY STENT INTERVENTION (PCI-S)  12/02/2012   Procedure: PERCUTANEOUS CORONARY STENT INTERVENTION (PCI-S);  Surgeon: Jettie Booze, MD;  Location: [facility] CATH LAB;  Service: Cardiovascular;;  DES to the MID LAD   POLYPECTOMY  04/17/2021   Procedure: POLYPECTOMY;  Surgeon: Daneil Dolin, MD;  [facility]  [facility];  Service: Endoscopy;;    Allergies  Allergies  Allergen Reactions   Crestor [Rosuvastatin]     Crestor 20 mg caused muscle aches - pt is able to tolerate lower doses (takes '10mg'$ )   Lipitor [Atorvastatin]     Atorvastatin 40 mg caused fatigue   Lisinopril Cough   Simvastatin     Muscle aches    History of Present Illness    SHANTINIQUE Lee is a 81 y.o. female with a hx of CAD /p DES to RCA 2009, DES to mid LAS 2014, 08/2021 ISR of lAD and RCA treated with cutting balloon  angioplasty, HTN, HLD, GERD, breast cancer last seen 03/20/22.  11/2021 noted myalgias with titrated Crestor and dose was reduced and Zetia initiated. At clinic visit 03/20/22 noted feeling in a fog after morning medications. Was recommended to move Zetia, Crestor to the evening.   Presents today for follow up with her husband. She wants to be off some of her medications except her cardiac meds. Discussed that we could not discontinue her noncardiac meds. Sh ehas stopped her amdoipine 1 week ago, moved some of her meds to the evening, but still feels she feels like she is "in a fog" all the time .Which started a month and a half ago. Tells me when she wakes up feels good for 30-40 minutes. Also had a serious case of vertigo which she tells me is not presently occurring. 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   03/23/22 TSH 0.93 which was normal, Hb 13.7, creatinine 0.9 09/2021 LDL 117 BP at home 143/71, 13/82, 145/71, 135/65, 114/64   EKGs/Labs/Other Studies Reviewed:   The following studies were reviewed today:    LHC: 08/2021  Prox RCA lesion is 90% stenosed.   Mid RCA lesion is 20% stenosed.   Prox LAD to Mid LAD lesion is 70% stenosed.   Ost LAD to Prox LAD lesion is 80% stenosed.   Post intervention, there is a 0% residual stenosis.   Post intervention, there is a 0% residual stenosis.   Post intervention, there is a 0% residual stenosis.   The left ventricular systolic function is normal.   LV end diastolic pressure is normal.   The left ventricular ejection fraction is 55-65% by visual estimate.   Two-vessel coronary obstructive disease with in-stent restenosis of the proximal to mid LAD stent with narrowing of 80 and 70%,  and 90% in-stent restenosis of the previously placed proximal to mid RCA stent and a dominant RCA.  The left circumflex coronary artery is a large caliber normal vessel.   Normal LV function with EF estimated 65% without focal segmental wall  motion abnormalities.  LVEDP 14 mmHg   Successful two-vessel coronary intervention utilizing a ScorFlex 2.5 x 10 mm cutting balloon and post stent dilatation with a 2.75 x 15 mm Navarre balloon utilized in both the RCA and LAD vessels with the in-stent  stenosis in both vessels being reduced to 0% and brisk TIMI-3 flow.   RECOMMENDATION: DAPT for 6 to 12 months.  Medical therapy with optimal blood pressure control aggressive lipid management.  If patient cannot tolerate high potency statin therapy with possible concomitant Zetia, consider PCSK9 inhibition.  EKG:  EKG is  ordered today.  The ekg ordered today demonstrates sinus bradycardia 59 bpm with known right bundle branch block and LAD  Recent Labs: 08/10/2021: ALT 16 08/18/2021: BUN 11; Creatinine, Ser 0.91; Hemoglobin 11.8; Platelets 222; Potassium 3.9;  Sodium 138  Recent Lipid Panel    Component Value Date/Time   CHOL 202 (H) 10/04/2021 0956   TRIG 90 10/04/2021 0956   TRIG 119 05/16/2010 0000   HDL 67 10/04/2021 0956   CHOLHDL 3.0 10/04/2021 0956   VLDL 18 10/04/2021 0956   LDLCALC 117 (H) 10/04/2021 0956   LDLCALC 99 05/05/2019 1124   LDLDIRECT 144.4 11/09/2013 0934     Home Medications   Current Meds  Medication Sig   aspirin EC 81 MG tablet Take 81 mg by mouth daily.    Baclofen 5 MG TABS Take 1 tablet by mouth daily.   clopidogrel (PLAVIX) 75 MG tablet Take 1 tablet (75 mg total) by mouth daily with breakfast.   DULoxetine (CYMBALTA) 30 MG capsule Take 30 mg by mouth daily.   ezetimibe (ZETIA) 10 MG tablet TAKE 1 TABLET(10 MG) BY MOUTH DAILY   latanoprost (XALATAN) 0.005 % ophthalmic solution Place 1 drop into both eyes at bedtime.    meloxicam (MOBIC) 15 MG tablet Take 15 mg by mouth daily.   metoprolol succinate (TOPROL-XL) 25 MG 24 hr tablet TAKE 1 TABLET BY MOUTH DAILY   montelukast (SINGULAIR) 10 MG tablet Take 10 mg by mouth daily.   nitroGLYCERIN (NITROSTAT) 0.4 MG SL tablet Place 1 tablet (0.4 mg total) under the  tongue every 5 (five) minutes x 3 doses as needed for chest pain. May repeat x3   pantoprazole (PROTONIX) 40 MG tablet Take '40mg'$  twice daily for 2 weeks then go back to '40mg'$  once daily.   rosuvastatin (CRESTOR) 20 MG tablet Take 20 mg by mouth daily.   Wheat Dextrin (BENEFIBER PO) Take 1 Dose by mouth daily.   [DISCONTINUED] amLODipine (NORVASC) 2.5 MG tablet Take 1 tablet (2.5 mg total) by mouth daily.   [DISCONTINUED] isosorbide mononitrate (IMDUR) 60 MG 24 hr tablet TAKE 1 TABLET BY MOUTH DAILY     Review of Systems      All other systems reviewed and are otherwise negative except as noted above.  Physical Exam    VS:  BP (!) 176/90 (BP Location: Left Arm, Patient Position: Sitting, Cuff Size: Normal)   Pulse (!) 59   Ht '5\' 2"'$  (1.575 m)   Wt 136 lb 14.4 oz (62.1 kg)   BMI 25.04 kg/m  , BMI Body mass index is 25.04 kg/m.  Wt Readings from Last 3 Encounters:  04/20/22 136 lb 14.4 oz (62.1 kg)  03/20/22 135 lb 3.2 oz (61.3 kg)  03/06/22 137 lb 3.2 oz (62.2 kg)     GEN: Well nourished, well developed, in no acute distress. HEENT: normal. Neck: Supple, no JVD, carotid bruits, or masses. Cardiac: RRR, no murmurs, rubs, or gallops. No clubbing, cyanosis, edema.  Radials/PT 2+ and equal bilaterally.  Respiratory:  Respirations regular and unlabored, clear to auscultation bilaterally. GI: Soft, nontender, nondistended. MS: No deformity or atrophy. Skin: Warm and dry, no rash. Neuro:  Strength and sensation are intact. Psych: Normal affect.  Assessment & Plan    Fatigue / Lightheadedness - Feels "in a fog". No improvement since adjusting time of meds. She attributes her symptoms to medications. Self discontinued Amlodipine with no improvement but she is hesitant to resume. We will reduce Imdur to '30mg'$  QD. If still with fatigue at follow up, could consider reduced dose of Metoprolol. Update carotid duplex due to lightheadedness. Encouraged to eat regular meals, stay well hydrated to  prevent dehydration or hypoglycemia contributory to symptoms.   CAD - Stable with  no anginal symptoms. No indication for ischemic evaluation.  GDMT aspirin, plavix, zetia, imdur, metoprolol, crestor. Heart healthy diet and regular cardiovascular exercise encouraged. Per Dr. Domenic Polite may hold Plavix 7 days prior to procedures.   GERD - Continue protonix.   HTN - Elevated in clinic as has not taken her medications today. Some hypotension at home. She already self discontinued her Amlodipine. Reduce Imdur to '30mg'$  QD. Educated to report BP consistently >130/80.  HLD - Intolerant to higher doses Crestor. Continue Crestor '10mg'$  QD, Zetia 67mQD.    Disposition: Follow up in 3 month(s) with SRozann Lesches MD or APP.  Signed, CLoel Dubonnet NP 04/20/2022, 9:14 PM [facility]

## 2022-04-20 NOTE — Telephone Encounter (Signed)
   Patient Name: Dorothy Lee  DOB: 09/16/1941 MRN: [id number]  Primary Cardiologist: [name], MD  Chart reviewed as part of pre-operative protocol coverage. Given past medical history and time since last visit, based on ACC/AHA guidelines, Dorothy Lee would be at acceptable risk for the planned procedure without further cardiovascular testing.   The patient was advised that if she develops new symptoms prior to surgery to contact our office to arrange for a follow-up visit, and she verbalized understanding.  Per Dr. Domenic Polite may hold Plavix 7 days prior.   I will route this recommendation to the requesting party via Epic fax function and remove from pre-op pool.  Please call with questions.  Loel Dubonnet, NP 04/20/2022, 9:19 PM

## 2022-04-24 ENCOUNTER — Ambulatory Visit: Payer: Medicare Other | Admitting: Cardiology

## 2022-04-25 ENCOUNTER — Telehealth: Payer: Self-pay | Admitting: Internal Medicine

## 2022-04-25 NOTE — Telephone Encounter (Signed)
See radiology report

## 2022-04-25 NOTE — Telephone Encounter (Signed)
I don't have anything stating pt needed procedure. Fowarding to [employer]

## 2022-04-25 NOTE — Telephone Encounter (Signed)
Patient husband called about setting patient up for a procedure?  Please advise

## 2022-04-26 ENCOUNTER — Telehealth (HOSPITAL_BASED_OUTPATIENT_CLINIC_OR_DEPARTMENT_OTHER): Payer: Self-pay

## 2022-04-26 ENCOUNTER — Ambulatory Visit (HOSPITAL_COMMUNITY)
Admission: RE | Admit: 2022-04-26 | Discharge: 2022-04-26 | Disposition: A | Discharge status: Home / Self Care | Payer: Medicare Other | Source: Ambulatory Visit | Attending: Family | Admitting: Family

## 2022-04-26 DIAGNOSIS — R42 Dizziness and giddiness: Secondary | ICD-10-CM | POA: Insufficient documentation

## 2022-04-26 DIAGNOSIS — I25118 Atherosclerotic heart disease of native coronary artery with other forms of angina pectoris: Secondary | ICD-10-CM

## 2022-04-26 NOTE — Telephone Encounter (Addendum)
Called results to patient and left results on VM (ok per DPR), instructions left to call office back if patient has any questions!  Repeat testing ordered.      ----- Message from Loel Dubonnet, NP sent at 04/26/2022  1:31 PM EDT ----- Carotid duplex shows only mild plaque in the carotid arteries. No significant stenosis that would cause lightheadedness.   Continue Aspirin, Zetia, Rosuvastatin to prevent from progressing.   Repeat carotid duplex in 1 year.

## 2022-05-01 NOTE — Telephone Encounter (Signed)
Dr. Dawson Bills office calling again to get clearance on this pt. She states they are faxing over a form that just needs a signature. Requesting call back to confirm you received fax.   [phone] ext [phone]

## 2022-05-01 NOTE — Telephone Encounter (Signed)
I called and left message for Dorothy Lee. I left message as to our pre op protocol and that all signatures are now electronic. MD recommendations are noted in the clearance notes as well. Left message that if they did send clearance request to Dr. Domenic Polite, they would have had to sent it to the [facility] office location, not sure if MD is in the office to sign the form. We have gone electronic with our clearance notes as to not delay the pt's procedure/surgery.  I will fax this note over as FYI to the requesting office. The clearance notes do reflect Dr. Domenic Polite recommendations in regard to Plavix.   [facility] strives to provide the very best care for our patients.

## 2022-06-06 ENCOUNTER — Ambulatory Visit: Payer: Medicare Other | Admitting: Gastroenterology

## 2022-06-06 ENCOUNTER — Encounter: Payer: Self-pay | Admitting: *Deleted

## 2022-06-06 ENCOUNTER — Telehealth: Payer: Self-pay | Admitting: *Deleted

## 2022-06-06 ENCOUNTER — Encounter: Payer: Self-pay | Admitting: Gastroenterology

## 2022-06-06 ENCOUNTER — Ambulatory Visit (INDEPENDENT_AMBULATORY_CARE_PROVIDER_SITE_OTHER): Payer: Medicare Other | Admitting: Gastroenterology

## 2022-06-06 ENCOUNTER — Other Ambulatory Visit: Payer: Self-pay | Admitting: *Deleted

## 2022-06-06 VITALS — BP 157/82 | HR 71 | Temp 97.9°F | Ht 62.0 in | Wt 137.1 lb

## 2022-06-06 DIAGNOSIS — R1030 Lower abdominal pain, unspecified: Secondary | ICD-10-CM

## 2022-06-06 DIAGNOSIS — I25118 Atherosclerotic heart disease of native coronary artery with other forms of angina pectoris: Secondary | ICD-10-CM

## 2022-06-06 NOTE — Telephone Encounter (Signed)
Pt informed that CT is scheduled for 07/11/22 at 9:00. Arrive at 8:45 am to check in. Pt informed that she will have to go to radiology to pick up contrast and she can have lab drawn at that time. She was informed to drink the 1st bottle of contrast at 7am and the 2nd bottle at 8:00 am. Will mail pt information on appt.

## 2022-06-06 NOTE — Patient Instructions (Signed)
We are arranging a CT in the near future.  If pain worsens, please call!  I will see you in 6 months!  I enjoyed seeing you again today! As you know, I value our relationship and want to provide genuine, compassionate, and quality care. I welcome your feedback. If you receive a survey regarding your visit,  I greatly appreciate you taking time to fill this out. See you next time!  Annitta Needs, PhD, ANP-BC [facility]

## 2022-06-06 NOTE — Telephone Encounter (Signed)
Lab ordered.

## 2022-06-06 NOTE — Progress Notes (Signed)
Gastroenterology Office Note     Primary Care Physician:  [name], MD  Primary Gastroenterologist: Dr. Gala Romney    Chief Complaint   Chief Complaint  Patient presents with   Abdominal Pain    Lower abdominal pain, sharp pain, comes and goes, last about one minute, happens about once per day.      History of Present Illness   Dorothy Lee is an 81 y.o. female presenting today in follow-up with a history of constipation, GERD  perforated diverticulitis Jan 2019, undergoing elective colectomy in Aug 2019. She has a history of multiple polyps, with surveillance just completed in July 2022 with one tubular adenoma. No further surveillance due to age. She also underwent EGD July 2022 with mild Schatzki ring, s/p dilation. Small hiatal hernia. Normal duodenum.   Cardiac cath Nov 2022 due to worsening fatigue and DOE and found to have  two-vessel obstructive coronary disease with in-stent restenosis of the proximal/mid LAD stent of 80% along with 90% in-stent stenosis of the proximal/mid RCA stent.  Angioplasty performed. She is on Plavix and aspirin.   Dysphagia: worsened, s/p EGD in July 2022 with mild Schatzki ring. BPE with laryngeal penetration but no tracheal aspiration. Esophageal dysmotility noted.    Has lower abdominal pain intermittently. Will go a week with it daily and then sometimes not. Woke up in the middle of the night with pain a few times. Will sometimes go all day and not eat, then eat in evening. Will not be hungry.   Taking Benefiber. If more constipated, will take more. Takes daily. A few times straining this week. Will use Miralax if having a tough time. No fever or chills.   Pantoprazole once daily.    Past Medical History:  Diagnosis Date   Allergic rhinitis    CAD (coronary artery disease)    a. s/p DES to RCA in 2009 b.  DES to mid-LAD in 2014 c. 08/2021: ISR of LAD and RCA stents treated with cutting balloon angioplasty   Cancer [facility])     Diverticulitis    Diverticulosis    Essential hypertension    GERD (gastroesophageal reflux disease)    Glaucoma    History of breast cancer    Right - s/p XRT 1984, surgery, no chemo   Hyperlipidemia    Personal history of radiation therapy    Rotator cuff syndrome, left    Scoliosis    Urinary incontinence     Past Surgical History:  Procedure Laterality Date   [facility]   right breast   COLONOSCOPY  2008   Dr. Oletta Lamas: diverticulosis, hemorrhoids, conscious sedation   COLONOSCOPY N/A 01/30/2016   Dr. Gala Romney: mild divertiuculosis in sigmoid colon. narrowing of the colon in association with the deverticular opening. 6 polyps removed, tubular adenoma. next TCS in 3 years   COLONOSCOPY WITH PROPOFOL N/A 04/17/2021   one 4 mm polyp at splenic flexure, otherwise normal. Tubular adenoma.   CORONARY ANGIOPLASTY WITH STENT PLACEMENT  02/2008   CORONARY BALLOON ANGIOPLASTY N/A 08/17/2021   Procedure: CORONARY BALLOON ANGIOPLASTY;  Surgeon: Troy Sine, MD;  Location: [facility];  Service: Cardiovascular;  Laterality: N/A;   ESOPHAGOGASTRODUODENOSCOPY  2014   Dr. Fuller Plan: erosive gastritis, no h.pylori   ESOPHAGOGASTRODUODENOSCOPY N/A 01/30/2016   Dr. Gala Romney: normal esophagus s/p dilation, small hh.   ESOPHAGOGASTRODUODENOSCOPY (EGD) WITH PROPOFOL N/A 04/17/2021  mild Schatzki ring, s/p dilation. Small hiatal hernia. Normal duodenum.   LEFT HEART CATH AND CORONARY ANGIOGRAPHY N/A 08/17/2021   Procedure: LEFT HEART CATH AND CORONARY ANGIOGRAPHY;  Surgeon: Troy Sine, MD;  Location: [facility];  Service: Cardiovascular;  Laterality: N/A;   LEFT HEART CATHETERIZATION WITH CORONARY ANGIOGRAM N/A 12/02/2012   Procedure: LEFT HEART CATHETERIZATION WITH CORONARY ANGIOGRAM;  Surgeon: Jettie Booze, MD;  Location: [facility] CATH LAB;  Service: Cardiovascular;  Laterality: N/A;   MALONEY DILATION N/A 01/30/2016    Procedure: Venia Minks DILATION;  Surgeon: Daneil Dolin, MD;  Location: [facility];  Service: Endoscopy;  Laterality: N/A;   MALONEY DILATION N/A 04/17/2021   Procedure: Venia Minks DILATION;  Surgeon: Daneil Dolin, MD;  Location: [facility];  Service: Endoscopy;  Laterality: N/A;   NASAL SINUS SURGERY     PARTIAL COLECTOMY N/A 05/19/2018   Procedure: PARTIAL COLECTOMY;  Surgeon: Aviva Signs, MD;  Location: [facility] OR[facility];  Service: General;  Laterality: N/A;   PERCUTANEOUS CORONARY STENT INTERVENTION (PCI-S)  12/02/2012   Procedure: PERCUTANEOUS CORONARY STENT INTERVENTION (PCI-S);  Surgeon: Jettie Booze, MD;  Location: [facility] CATH LAB;  Service: Cardiovascular;;  DES to the MID LAD   POLYPECTOMY  04/17/2021   Procedure: POLYPECTOMY;  Surgeon: Daneil Dolin, MD;  [facility]: [facility];  Service: Endoscopy;;    Current Outpatient Medications  Medication Sig Dispense Refill   amLODipine (NORVASC) 2.5 MG tablet Take 2.5 mg by mouth daily.     aspirin EC 81 MG tablet Take 81 mg by mouth daily.      Baclofen 5 MG TABS Take 1 tablet by mouth daily.     clopidogrel (PLAVIX) 75 MG tablet Take 1 tablet (75 mg total) by mouth daily with breakfast. 90 tablet 2   DULoxetine (CYMBALTA) 30 MG capsule Take 30 mg by mouth daily.     ezetimibe (ZETIA) 10 MG tablet TAKE 1 TABLET(10 MG) BY MOUTH DAILY 30 tablet 3   isosorbide mononitrate (IMDUR) 60 MG 24 hr tablet Take 0.5 tablets (30 mg total) by mouth daily. 45 tablet 3   latanoprost (XALATAN) 0.005 % ophthalmic solution Place 1 drop into both eyes at bedtime.      meclizine (ANTIVERT) 25 MG tablet Take 1 tablet (25 mg total) by mouth 3 (three) times daily as needed for dizziness. 30 tablet 0   meloxicam (MOBIC) 15 MG tablet Take 15 mg by mouth daily.     metoprolol succinate (TOPROL-XL) 25 MG 24 hr tablet TAKE 1 TABLET BY MOUTH DAILY 90 tablet 2   montelukast (SINGULAIR) 10 MG tablet Take 10 mg by mouth daily.     nitroGLYCERIN (NITROSTAT) 0.4 MG SL  tablet Place 1 tablet (0.4 mg total) under the tongue every 5 (five) minutes x 3 doses as needed for chest pain. May repeat x3 25 tablet 3   ondansetron (ZOFRAN-ODT) 4 MG disintegrating tablet Take 4 mg by mouth every 8 (eight) hours as needed for nausea or vomiting.     pantoprazole (PROTONIX) 40 MG tablet Take '40mg'$  twice daily for 2 weeks then go back to '40mg'$  once daily. 90 tablet 1   Rosuvastatin Calcium 10 MG CPSP Take 10 mg by mouth daily.     Wheat Dextrin (BENEFIBER PO) Take 1 Dose by mouth daily.     No current facility-administered medications for this visit.    Allergies as of 06/06/2022 - Review Complete 06/06/2022  Allergen Reaction Noted   Crestor [rosuvastatin]  08/11/2013   Lipitor [atorvastatin]  08/11/2013   Lisinopril Cough 02/16/2013   Simvastatin  08/11/2013    Family History  Problem Relation Age of Onset   Emphysema Father    Emphysema Brother    Heart disease Brother        x3 brothers   Lung cancer Brother    Throat cancer Brother        head/neck cancer   Heart attack Brother    Heart attack Sister    Heart attack Son        blood clot   Colon cancer Neg Hx     Social History   Socioeconomic History   Marital status: Married    Spouse name: Not on file   Number of children: 1   Years of education: Not on file   Highest education level: Not on file  Occupational History   Occupation: Probation officer   Occupation: COSMETOLOGY    Employer: [employer]  Tobacco Use   Smoking status: Never    Passive exposure: Past   Smokeless tobacco: Never   Tobacco comments:    Widowed, lives alone but has sig other.   Vaping Use   Vaping Use: Never used  Substance and Sexual Activity   Alcohol use: Not Currently   Drug use: No   Sexual activity: Yes    Birth control/protection: Post-menopausal  Other Topics Concern   Not on file  Social History Narrative   Widowed, husband died.    Lives alone but has significant other, married in 19-Jan-2016 to [place].     Had one son, but he passed away.    Retired as Probation officer - [employer] and part-time at [employer]   Son died in 01-19-16 of AMI. Has two grandsons.       Social Determinants of Health   Financial Resource Strain: Not on file  Food Insecurity: Not on file  Transportation Needs: Not on file  Physical Activity: Not on file  Stress: Not on file  Social Connections: Not on file  Intimate Partner Violence: Not on file     Review of Systems   Gen: Denies any fever, chills, fatigue, weight loss, lack of appetite.  CV: Denies chest pain, heart palpitations, peripheral edema, syncope.  Resp: Denies shortness of breath at rest or with exertion. Denies wheezing or cough.  GI: see HPI GU : Denies urinary burning, urinary frequency, urinary hesitancy MS: Denies joint pain, muscle weakness, cramps, or limitation of movement.  Derm: Denies rash, itching, dry skin Psych: Denies depression, anxiety, memory loss, and confusion Heme: Denies bruising, bleeding, and enlarged lymph nodes.   Physical Exam   BP (!) 157/82 (BP Location: Left Arm, Patient Position: Sitting, Cuff Size: Normal)   Pulse 71   Temp 97.9 F (36.6 C) (Oral)   Ht '5\' 2"'$  (1.575 m)   Wt 137 lb 1.6 oz (62.2 kg)   BMI 25.08 kg/m  General:   Alert and oriented. Pleasant and cooperative. Well-nourished and well-developed.  Head:  Normocephalic and atraumatic. Eyes:  Without icterus Abdomen:  +BS, soft, non-tender and non-distended. No HSM noted. No guarding or rebound. No masses appreciated.  Rectal:  Deferred  Msk:  Symmetrical without gross deformities. Normal posture. Extremities:  Without edema. Neurologic:  Alert and  oriented x4;  grossly normal neurologically. Skin:  Intact without significant lesions or rashes. Psych:  Alert and cooperative. Normal mood and affect.   Assessment   Dorothy Poles  Lee is an 81 y.o. female presenting today in follow-up with a history of constipation, GERD   perforated diverticulitis Jan 2019, undergoing elective colectomy in Aug 2019, dysphagia, and multiple polyps with colonoscopy up-to-date.   She has noted intermittent lower abdominal pain without precipitating or relieving factors, at times waking her up at night. With her complicated past history, will need CT .   Constipation: taking fiber and will use Miralax prn.   GERD: controlled on pantoprazole daily.     PLAN    CT abd/pelvis  Continue Benefiber Continue pantoprazole Return in 6 months or sooner if needed   Annitta Needs, PhD, ANP-BC [facility]

## 2022-06-20 ENCOUNTER — Telehealth: Payer: Self-pay | Admitting: Cardiology

## 2022-06-20 NOTE — Telephone Encounter (Signed)
Patient needs to get nerve block in her back.  She said Dr. Thalia Bloodgood in [place], [place] had fax a request for her to come off her Plavix for 7 days.  She states they faxed the request a month ago and then again today.

## 2022-06-21 NOTE — Telephone Encounter (Signed)
Patient called to follow-up on Dr. Myles Gip approval to have Plavix held for 7 days prior to her procedure.  Patient stated the approval can be faxed to Dr. Thalia Bloodgood at fax# [phone].

## 2022-06-21 NOTE — Telephone Encounter (Signed)
Clearance already provided via phone note 04/03/22 and has been faxed to surgeons office multiple times.   Will ask nursing staff to fax again.   Laurann Montana, NP

## 2022-06-21 NOTE — Telephone Encounter (Signed)
At the request of the NP, phone note providing clearance faxed.

## 2022-06-21 NOTE — Telephone Encounter (Signed)
Per OV with Terie Purser, NP on 04/20/2022:  CAD - Stable with no anginal symptoms. No indication for ischemic evaluation.  GDMT aspirin, plavix, zetia, imdur, metoprolol, crestor. Heart healthy diet and regular cardiovascular exercise encouraged. Per Dr. Domenic Polite may hold Plavix 7 days prior to procedures.   Please advise.

## 2022-07-06 ENCOUNTER — Other Ambulatory Visit (HOSPITAL_COMMUNITY)
Admission: RE | Admit: 2022-07-06 | Discharge: 2022-07-06 | Disposition: A | Discharge status: Home / Self Care | Payer: Medicare Other | Source: Ambulatory Visit | Attending: Gastroenterology | Admitting: Gastroenterology

## 2022-07-06 DIAGNOSIS — R1030 Lower abdominal pain, unspecified: Secondary | ICD-10-CM | POA: Diagnosis present

## 2022-07-06 LAB — BASIC METABOLIC PANEL
Anion gap: 8 (ref 5–15)
BUN: 17 mg/dL (ref 8–23)
CO2: 27 mmol/L (ref 22–32)
Calcium: 9.4 mg/dL (ref 8.9–10.3)
Chloride: 106 mmol/L (ref 98–111)
Creatinine, Ser: 0.96 mg/dL (ref 0.44–1.00)
GFR, Estimated: 59 mL/min — ABNORMAL LOW (ref >60)
Glucose, Bld: 93 mg/dL (ref 70–99)
Potassium: 4 mmol/L (ref 3.5–5.1)
Sodium: 141 mmol/L (ref 135–145)

## 2022-07-11 ENCOUNTER — Ambulatory Visit (HOSPITAL_COMMUNITY)
Admission: RE | Admit: 2022-07-11 | Discharge: 2022-07-11 | Disposition: A | Discharge status: Home / Self Care | Payer: Medicare Other | Source: Ambulatory Visit | Attending: Gastroenterology | Admitting: Gastroenterology

## 2022-07-11 DIAGNOSIS — R1030 Lower abdominal pain, unspecified: Secondary | ICD-10-CM | POA: Insufficient documentation

## 2022-07-11 MED ORDER — IOHEXOL 300 MG/ML  SOLN
100.0000 mL | Freq: Once | INTRAMUSCULAR | Status: AC | PRN
  Administered 2022-07-11: 100 mL via INTRAVENOUS

## 2022-07-17 ENCOUNTER — Emergency Department (HOSPITAL_BASED_OUTPATIENT_CLINIC_OR_DEPARTMENT_OTHER)
Admission: EM | Admit: 2022-07-17 | Discharge: 2022-07-18 | Disposition: A | Discharge status: Home / Self Care | Payer: Medicare Other | Attending: Emergency Medicine | Admitting: Emergency Medicine

## 2022-07-17 ENCOUNTER — Other Ambulatory Visit: Payer: Self-pay

## 2022-07-17 ENCOUNTER — Emergency Department (HOSPITAL_COMMUNITY): Payer: Medicare Other

## 2022-07-17 ENCOUNTER — Encounter (HOSPITAL_BASED_OUTPATIENT_CLINIC_OR_DEPARTMENT_OTHER): Payer: Self-pay

## 2022-07-17 ENCOUNTER — Other Ambulatory Visit (HOSPITAL_BASED_OUTPATIENT_CLINIC_OR_DEPARTMENT_OTHER): Payer: Self-pay

## 2022-07-17 ENCOUNTER — Emergency Department (HOSPITAL_BASED_OUTPATIENT_CLINIC_OR_DEPARTMENT_OTHER): Payer: Medicare Other

## 2022-07-17 DIAGNOSIS — R930 Abnormal findings on diagnostic imaging of skull and head, not elsewhere classified: Secondary | ICD-10-CM | POA: Insufficient documentation

## 2022-07-17 DIAGNOSIS — R42 Dizziness and giddiness: Secondary | ICD-10-CM | POA: Insufficient documentation

## 2022-07-17 LAB — CBC
HCT: 44.5 % (ref 36.0–46.0)
Hemoglobin: 14.1 g/dL (ref 12.0–15.0)
MCH: 29.5 pg (ref 26.0–34.0)
MCHC: 31.7 g/dL (ref 30.0–36.0)
MCV: 93.1 fL (ref 80.0–100.0)
Platelets: 283 10*3/uL (ref 150–400)
RBC: 4.78 MIL/uL (ref 3.87–5.11)
RDW: 12.8 % (ref 11.5–15.5)
WBC: 7 10*3/uL (ref 4.0–10.5)
nRBC: 0 % (ref 0.0–0.2)

## 2022-07-17 LAB — URINALYSIS, ROUTINE W REFLEX MICROSCOPIC
Bilirubin Urine: NEGATIVE
Glucose, UA: NEGATIVE mg/dL
Hgb urine dipstick: NEGATIVE
Ketones, ur: 15 mg/dL — AB
Leukocytes,Ua: NEGATIVE
Nitrite: NEGATIVE
Protein, ur: NEGATIVE mg/dL
Specific Gravity, Urine: 1.017 (ref 1.005–1.030)
pH: 5.5 (ref 5.0–8.0)

## 2022-07-17 LAB — BASIC METABOLIC PANEL
Anion gap: 8 (ref 5–15)
BUN: 16 mg/dL (ref 8–23)
CO2: 27 mmol/L (ref 22–32)
Calcium: 9.6 mg/dL (ref 8.9–10.3)
Chloride: 106 mmol/L (ref 98–111)
Creatinine, Ser: 0.96 mg/dL (ref 0.44–1.00)
GFR, Estimated: 59 mL/min — ABNORMAL LOW (ref >60)
Glucose, Bld: 96 mg/dL (ref 70–99)
Potassium: 3.9 mmol/L (ref 3.5–5.1)
Sodium: 141 mmol/L (ref 135–145)

## 2022-07-17 LAB — CBG MONITORING, ED: Glucose-Capillary: 68 mg/dL — ABNORMAL LOW (ref 70–99)

## 2022-07-17 MED ORDER — GADOBUTROL 1 MMOL/ML IV SOLN
6.0000 mL | Freq: Once | INTRAVENOUS | Status: AC | PRN
  Administered 2022-07-17: 6 mL via INTRAVENOUS

## 2022-07-17 MED ORDER — SODIUM CHLORIDE 0.9 % IV SOLN: Freq: Once | INTRAVENOUS | Status: AC

## 2022-07-17 MED ORDER — MECLIZINE HCL 25 MG PO TABS
25.0000 mg | ORAL_TABLET | Freq: Once | ORAL | Status: AC
  Administered 2022-07-17: 25 mg via ORAL
  Filled 2022-07-17: qty 1

## 2022-07-17 MED ORDER — ONDANSETRON HCL 4 MG PO TABS
4.0000 mg | ORAL_TABLET | Freq: Four times a day (QID) | ORAL | 0 refills | Status: DC
Start: 2022-07-17 — End: 2022-07-24
  Filled 2022-07-17: qty 12, 3d supply, fill #0

## 2022-07-17 MED ORDER — MECLIZINE HCL 12.5 MG PO TABS
12.5000 mg | ORAL_TABLET | Freq: Three times a day (TID) | ORAL | 0 refills | Status: DC | PRN
  Filled 2022-07-17: qty 100, 34d supply, fill #0

## 2022-07-17 MED ORDER — LORAZEPAM 0.5 MG PO TABS
0.5000 mg | ORAL_TABLET | Freq: Three times a day (TID) | ORAL | 0 refills | Status: DC | PRN
  Filled 2022-07-17: qty 10, 4d supply, fill #0

## 2022-07-17 MED ORDER — LORAZEPAM 2 MG/ML IJ SOLN: 0.5000 mg | Freq: Once | INTRAMUSCULAR | Status: DC

## 2022-07-17 NOTE — ED Provider Notes (Signed)
Seen after prior EDP.  MRI is without acute pathology.  Patient is comfortable and desires discharge home.  Patient does understand need for close outpatient follow-up.  Strict return precautions given and understood.         Valarie Merino, MD 07/17/22 2325

## 2022-07-17 NOTE — ED Notes (Signed)
[facility] called for cancellation of patient transfer Patient is going POV

## 2022-07-17 NOTE — ED Provider Notes (Signed)
[facility] EMERGENCY DEPT Provider Note   CSN: [id number] Arrival date & time: 07/17/22  1139     History Chief Complaint  Patient presents with   Dizziness    HPI Dorothy Lee is a 81 y.o. female presenting for dizziness.  She states that she has had episodic dizziness over the last 4 days.  It has been waxing and waning.  She has discussed with primary care provider who think she is suffering from vertigo.  She thinks she has been taking Antivert though not frequently.  Has not taken any today.  She endorses unsteadiness when walking.  Denies any neurosensory changes otherwise.  Denies any numbness, tingling, loss of function of any extremities.  Symptoms been present for 4 days intermittently worse with motion when she gets up.  She has a history of benign vertigo though she feels these episodes are more severe than prior. Denies fevers or chills, nausea or vomiting, syncope or shortness of breath, headache.  Denies presyncope or lightheadedness. Patient's recorded medical, surgical, social, medication list and allergies were reviewed in the Snapshot window as part of the initial history.   Review of Systems   Review of Systems  Constitutional:  Negative for chills and fever.  HENT:  Negative for ear pain and sore throat.   Eyes:  Negative for pain and visual disturbance.  Respiratory:  Negative for cough and shortness of breath.   Cardiovascular:  Negative for chest pain and palpitations.  Gastrointestinal:  Negative for abdominal pain and vomiting.  Genitourinary:  Negative for dysuria and hematuria.  Musculoskeletal:  Negative for arthralgias and back pain.  Skin:  Negative for color change and rash.  Neurological:  Positive for dizziness. Negative for seizures and syncope.  All other systems reviewed and are negative.   Physical Exam Updated Vital Signs BP 132/69   Pulse 63   Temp (!) 97.4 F (36.3 C) (Oral)   Resp 15   Ht '5\' 2"'$  (1.575 m)   Wt 59.9  kg   SpO2 100%   BMI 24.14 kg/m  Physical Exam Vitals and nursing note reviewed.  Constitutional:      General: She is not in acute distress.    Appearance: She is well-developed.  HENT:     Head: Normocephalic and atraumatic.  Eyes:     Conjunctiva/sclera: Conjunctivae normal.  Cardiovascular:     Rate and Rhythm: Normal rate and regular rhythm.     Heart sounds: No murmur heard. Pulmonary:     Effort: Pulmonary effort is normal. No respiratory distress.     Breath sounds: Normal breath sounds.  Abdominal:     General: There is no distension.     Palpations: Abdomen is soft.     Tenderness: There is no abdominal tenderness. There is no right CVA tenderness or left CVA tenderness.  Musculoskeletal:        General: No swelling or tenderness. Normal range of motion.     Cervical back: Neck supple.  Skin:    General: Skin is warm and dry.     Capillary Refill: Capillary refill takes less than 2 seconds.  Neurological:     General: No focal deficit present.     Mental Status: She is alert and oriented to person, place, and time. Mental status is at baseline.     Cranial Nerves: No cranial nerve deficit.     Sensory: No sensory deficit.     Motor: No weakness.     Coordination: Coordination normal.  Gait: Gait normal.     Deep Tendon Reflexes: Reflexes normal.     Comments: Head impulse: Patient has a corrective saccade with head impulse when trying to stay fixated.  Leftward nystagmus appreciated on rightward head impulse Nystagmus: Patient has leftward beats of nystagmus when gazing to the right side. Test of skew: Negative  Psychiatric:        Mood and Affect: Mood normal.      ED Course/ Medical Decision Making/ A&P    Procedures Procedures   Medications Ordered in ED Medications  meclizine (ANTIVERT) tablet 25 mg (25 mg Oral Given 07/17/22 1222)    Medical Decision Making:    Dorothy Lee is a 81 y.o. female who presented to the ED today with  intermittent dizziness detailed above.     Patient's presentation is complicated by their history of advanced age.  Patient placed on continuous vitals and telemetry monitoring while in ED which was reviewed periodically.   Complete initial physical exam performed, notably the patient  was HDS in NAD. HINTS reassuring for peripheral etiology.      Reviewed and confirmed nursing documentation for past medical history, family history, social history.    Initial Assessment:   With the patient's presentation of dizziness, most likely diagnosis is BPPV. Other diagnoses were considered including (but not limited to) Lumierre's, CVA, meningitis, cardiac etiology, metabolic disruption. These are considered less likely due to history of present illness and physical exam findings.   This is most consistent with an acute life/limb threatening illness complicated by underlying chronic conditions.  Initial Plan:  Given duration of sxs, CTH to evaluate for intracranial lesion Screening labs including CBC and Metabolic panel to evaluate for infectious or metabolic etiology of disease.  Urinalysis with reflex culture ordered to evaluate for UTI or relevant urologic/nephrologic pathology.  EKG to evaluate for cardiac pathology. Objective evaluation as below reviewed with plan for close reassessment after meclizine administration.  Initial Study Results:   Laboratory  All laboratory results reviewed without evidence of clinically relevant pathology.     EKG EKG was reviewed independently. Rate, rhythm, axis, intervals all examined and without medically relevant abnormality. ST segments without concerns for elevations.    Radiology  All images reviewed independently. Agree with radiology report at this time.   CT HEAD WO CONTRAST (5MM)  Result Date: 07/17/2022 CLINICAL DATA:  Trauma EXAM: CT HEAD WITHOUT CONTRAST TECHNIQUE: Contiguous axial images were obtained from the base of the skull through the  vertex without intravenous contrast. RADIATION DOSE REDUCTION: This exam was performed according to the departmental dose-optimization program which includes automated exposure control, adjustment of the mA and/or kV according to patient size and/or use of iterative reconstruction technique. COMPARISON:  CT head 05/06/21 FINDINGS: Brain: There is a focal hypodensity in the medulla (series 2, image 4). Sequela of mild chronic microvascular disease. No hemorrhage. No hydrocephalus. No extra-axial fluid collection. No mass effect. Vascular: No hyperdense vessel or unexpected calcification. Skull: Normal. Negative for fracture or focal lesion. Sinuses/Orbits: Bilateral lens replacement. Prior endoscopic sinus surgery with likely bilateral maxillary antrostomies, inferior ethmoidectomies, as well as sphenoidotomies.Mild mucosal thickening right maxillary sinus. Other: None. IMPRESSION: 1. Focal hypodensity in the medulla may be artifactual, but an acute infarct is also differential consideration. Recommend further evaluation with a brain MRI. 2. No acute intracranial hemorrhage. Electronically Signed   By: Marin Roberts M.D.   On: 07/17/2022 12:52     Final Assessment and Plan:   Discussed with Dr.  Quinn Axe of neurology.  She agreed with need for MRI.  Discussed with Dr. De Burrs at emergency department at [facility].  Accepted in transfer for MRI.  Patient will need consultation with neurology if MRI is positive.  If negative, likely consistent with BPPV given negative hints exam and patient would be stable with outpatient meclizine therapy though she will need reassessment.   Disposition:  Based on the above findings, I believe patient is stable for discharge.    Patient/family educated about specific return precautions for given chief complaint and symptoms.  Patient/family educated about follow-up with PCP .     Patient/family expressed understanding of return precautions and need for follow-up.  Patient spoken to regarding all imaging and laboratory results and appropriate follow up for these results. All education provided in verbal form with additional information in written form. Time was allowed for answering of patient questions. Patient discharged.    Emergency Department Medication Summary:   Medications  meclizine (ANTIVERT) tablet 25 mg (25 mg Oral Given 07/17/22 1222)    Clinical Impression:  1. Dizziness      Transfer via POV   Final Clinical Impression(s) / ED Diagnoses Final diagnoses:  Dizziness    Rx / DC Orders ED Discharge Orders     None         Tretha Sciara, MD 07/17/22 1423

## 2022-07-17 NOTE — ED Notes (Signed)
Direct message by Haze Justin RN to Silas Sacramento RN charge at [facility] ED to advise that pt will be arriving POV.  Pt is transported by visitor to [facility] ED in POV.  Saline lock in place

## 2022-07-17 NOTE — ED Triage Notes (Signed)
Pt c/o dizziness x4 days. Denies N/V, HA, vision changes, states she is "unable to walk good," but denies fall/injury. States dizziness is worse when standing

## 2022-07-17 NOTE — Discharge Instructions (Addendum)
Return for any problem.  ?

## 2022-07-17 NOTE — ED Notes (Signed)
ED to ED transfer with receiving Dr. Cline Cools for dizziness needing MRI [facility] called for patient transport @ 2:10 pm

## 2022-07-17 NOTE — ED Notes (Signed)
Patient currently in MRI.

## 2022-07-17 NOTE — ED Notes (Signed)
Pt is alert and oriented, NIHSS is 0.  Pt states that the only deficit she continues to have is dizziness which is a little worse with movement and has been ongoing for 4 days.  Swallow screen done and pt given snack and drink as CBG was 68.

## 2022-07-17 NOTE — ED Notes (Signed)
Pt currently unable to provide urine sample. 

## 2022-07-17 NOTE — ED Notes (Signed)
Patient arrived via POV tx report given to [employer]

## 2022-07-18 ENCOUNTER — Other Ambulatory Visit (HOSPITAL_BASED_OUTPATIENT_CLINIC_OR_DEPARTMENT_OTHER): Payer: Self-pay

## 2022-07-19 ENCOUNTER — Other Ambulatory Visit: Payer: Self-pay | Admitting: Cardiology

## 2022-07-24 ENCOUNTER — Encounter: Payer: Self-pay | Admitting: Student

## 2022-07-24 ENCOUNTER — Ambulatory Visit: Payer: Medicare Other | Attending: Student | Admitting: Student

## 2022-07-24 VITALS — BP 130/72 | HR 72 | Ht 62.0 in | Wt 134.2 lb

## 2022-07-24 DIAGNOSIS — R42 Dizziness and giddiness: Secondary | ICD-10-CM | POA: Diagnosis present

## 2022-07-24 DIAGNOSIS — I2510 Atherosclerotic heart disease of native coronary artery without angina pectoris: Secondary | ICD-10-CM

## 2022-07-24 DIAGNOSIS — E785 Hyperlipidemia, unspecified: Secondary | ICD-10-CM | POA: Diagnosis present

## 2022-07-24 DIAGNOSIS — I10 Essential (primary) hypertension: Secondary | ICD-10-CM | POA: Diagnosis present

## 2022-07-24 NOTE — Patient Instructions (Addendum)
Medication Instructions:  Your physician recommends that you continue on your current medications as directed. Please refer to the Current Medication list given to you today.   Labwork: Today: -FLP  Testing/Procedures: None  Follow-Up: Follow up with Bernerd Pho, PA-C or Dr. Domenic Polite in 6 months.   Any Other Special Instructions Will Be Listed Below (If Applicable).     If you need a refill on your cardiac medications before your next appointment, please call your pharmacy.

## 2022-07-24 NOTE — Progress Notes (Signed)
Cardiology Office Note    Date:  07/24/2022   ID:  [name], DOB [birth year], MRN [id number]  PCP:  Dorothy Macadam, MD  Cardiologist: Dorothy Lesches, MD    Chief Complaint  Patient presents with   Follow-up    3 month visit    History of Present Illness:    Dorothy Lee is a 81 y.o. female with past medical history of CAD (s/p DES to RCA in 2009, DES to mid-LAD in 2014, s/p cath in 08/2021 showing ISR of LAD and RCA stents which were treated with cutting balloon angioplasty), HTN, HLD, GERD and history of breast cancer who presents to the office today for 84-monthfollow-up.  She was last examined by Dorothy Montana NP in 04/2022 and reported still being in a fog after taking some of her medications and had recently stopped Amlodipine on her own. Imdur was reduced to 30 mg daily to see if this would help with her symptoms and could consider dose reduction of Metoprolol at follow-up. Carotid Dopplers were updated and showed mild calcified plaque with less than 50% stenosis bilaterally and was recommended to repeat in 1 year.  [facility] for worsening dizziness over the past 4 days. It was felt that symptoms were likely secondary to BPPV but an MRI was recommended [facility]. [facility] showed no acute intracranial abnormalities but she was noted to have an old left cerebellar infarct and old bilateral corona radiata small vessel infarcts.  In talking with the patient today, she reports still having awful vertigo for the past 2 weeks and feels like she is "in a fog" when this occurs. Symptoms are typically worse with positional changes and have not improved with medication adjustments as discussed above. She was previously evaluated by ENT but has not been evaluated during her recent episode and we discussed referral for this. She denies any recent exertional chest pain or dyspnea on exertion. Reports  occasional episodes of sharp pain which last for a few seconds and spontaneously resolve. No recent orthopnea, PND or pitting edema.   Past Medical History:  Diagnosis Date   Allergic rhinitis    CAD (coronary artery disease)    a. s/p DES to RCA in 2009 b.  DES to mid-LAD in 2014 c. 08/2021: ISR of LAD and RCA stents treated with cutting balloon angioplasty   Cancer ([facility]    Diverticulitis    Diverticulosis    Essential hypertension    GERD (gastroesophageal reflux disease)    Glaucoma    History of breast cancer    Right - s/p XRT 1984, surgery, no chemo   Hyperlipidemia    Personal history of radiation therapy    Rotator cuff syndrome, left    Scoliosis    Urinary incontinence     Past Surgical History:  Procedure Laterality Date   [place]  right breast   COLONOSCOPY  2008   Dr. EOletta Lamas diverticulosis, hemorrhoids, conscious sedation   COLONOSCOPY N/A 01/30/2016   Dr. RGala Romney mild divertiuculosis in sigmoid colon. narrowing of the colon in association with the deverticular opening. 6 polyps removed, tubular adenoma. next TCS in 3 years   COLONOSCOPY WITH PROPOFOL N/A 04/17/2021   one 4 mm polyp at splenic flexure, otherwise normal. Tubular adenoma.   CORONARY ANGIOPLASTY WITH STENT PLACEMENT  02/2008  CORONARY BALLOON ANGIOPLASTY N/A 08/17/2021   Procedure: CORONARY BALLOON ANGIOPLASTY;  Surgeon: Dorothy Sine, MD;  Location: [place] [facility];  Service: Cardiovascular;  Laterality: N/A;   ESOPHAGOGASTRODUODENOSCOPY  2014   Dr. Fuller Plan: erosive gastritis, no h.pylori   ESOPHAGOGASTRODUODENOSCOPY N/A 01/30/2016   Dr. Gala Romney: normal esophagus s/p dilation, small hh.   ESOPHAGOGASTRODUODENOSCOPY (EGD) WITH PROPOFOL N/A 04/17/2021   mild Schatzki ring, s/p dilation. Small hiatal hernia. Normal duodenum.   LEFT HEART CATH AND CORONARY ANGIOGRAPHY N/A 08/17/2021   Procedure: LEFT HEART CATH AND CORONARY  ANGIOGRAPHY;  Surgeon: Dorothy Sine, MD;  Location: [place] [facility];  Service: Cardiovascular;  Laterality: N/A;   LEFT HEART CATHETERIZATION WITH CORONARY ANGIOGRAM N/A 12/02/2012   Procedure: LEFT HEART CATHETERIZATION WITH CORONARY ANGIOGRAM;  Surgeon: Dorothy Booze, MD;  Location: [facility];  Service: Cardiovascular;  Laterality: N/A;   MALONEY DILATION N/A 01/30/2016   Procedure: [facility] DILATION;  Surgeon: Dorothy Dolin, MD;  [facility];  Service: Endoscopy;  Laterality: N/A;   MALONEY DILATION N/A 04/17/2021   Procedure: [facility] DILATION;  Surgeon: Dorothy Dolin, MD;  [facility];  Service: Endoscopy;  Laterality: N/A;   NASAL SINUS SURGERY     PARTIAL COLECTOMY N/A 05/19/2018   Procedure: PARTIAL COLECTOMY;  Surgeon: Dorothy Signs, MD;  Location: [facility];  Service: General;  Laterality: N/A;   PERCUTANEOUS CORONARY STENT INTERVENTION (PCI-S)  12/02/2012   Procedure: PERCUTANEOUS CORONARY STENT INTERVENTION (PCI-S);  Surgeon: Dorothy Booze, MD;  Location: [facility];  Service: Cardiovascular;;  DES to the MID LAD   POLYPECTOMY  04/17/2021   Procedure: POLYPECTOMY;  Surgeon: Dorothy Dolin, MD;  [facility];  Service: Endoscopy;;    Current Medications: Outpatient Medications Prior to Visit  Medication Sig Dispense Refill   amLODipine (NORVASC) 2.5 MG tablet Take 2.5 mg by mouth daily.     aspirin EC 81 MG tablet Take 81 mg by mouth daily.      Baclofen 5 MG TABS Take 1 tablet by mouth daily.     clopidogrel (PLAVIX) 75 MG tablet TAKE 1 TABLET(75 MG) BY MOUTH DAILY WITH BREAKFAST 90 tablet 2   DULoxetine (CYMBALTA) 30 MG capsule Take 30 mg by mouth daily.     ezetimibe (ZETIA) 10 MG tablet TAKE 1 TABLET(10 MG) BY MOUTH DAILY 30 tablet 3   isosorbide mononitrate (IMDUR) 60 MG 24 hr tablet Take 0.5 tablets (30 mg total) by mouth daily. 45 tablet 3   latanoprost (XALATAN) 0.005 % ophthalmic solution Place 1 drop into both eyes  at bedtime.      LORazepam (ATIVAN) 0.5 MG tablet Take 1 tablet (0.5 mg total) by mouth 3 (three) times daily as needed (vertigo). 10 tablet 0   meclizine (ANTIVERT) 25 MG tablet Take 1 tablet (25 mg total) by mouth 3 (three) times daily as needed for dizziness. 30 tablet 0   meloxicam (MOBIC) 15 MG tablet Take 15 mg by mouth daily.     metoprolol succinate (TOPROL-XL) 25 MG 24 hr tablet TAKE 1 TABLET BY MOUTH DAILY 90 tablet 2   nitroGLYCERIN (NITROSTAT) 0.4 MG SL tablet Place 1 tablet (0.4 mg total) under the tongue every 5 (five) minutes x 3 doses as needed for chest pain. May repeat x3 25 tablet 3   pantoprazole (PROTONIX) 40 MG tablet Take '40mg'$  twice daily for 2 weeks then go back to '40mg'$  once daily. 90 tablet 1   Rosuvastatin  Calcium 10 MG CPSP Take 10 mg by mouth daily.     Wheat Dextrin (BENEFIBER PO) Take 1 Dose by mouth daily.     meclizine (ANTIVERT) 12.5 MG tablet Take 1 tablet (12.5 mg total) by mouth 3 (three) times daily as needed for dizziness. 100 tablet 0   montelukast (SINGULAIR) 10 MG tablet Take 10 mg by mouth daily. (Patient not taking: Reported on 07/24/2022)     ondansetron (ZOFRAN) 4 MG tablet Take 1 tablet (4 mg total) by mouth every 6 (six) hours. (Patient not taking: Reported on 07/24/2022) 12 tablet 0   ondansetron (ZOFRAN-ODT) 4 MG disintegrating tablet Take 4 mg by mouth every 8 (eight) hours as needed for nausea or vomiting. (Patient not taking: Reported on 07/24/2022)     No facility-administered medications prior to visit.     Allergies:   Crestor [rosuvastatin], Lipitor [atorvastatin], Lisinopril, and Simvastatin   Social History   Socioeconomic History   Marital status: Married    Spouse name: Not on file   Number of children: 1   Years of education: Not on file   Highest education level: Not on file  Occupational History   Occupation: hair stylist   Occupation: COSMETOLOGY    Employer: [employer]  Tobacco Use   Smoking status: Never     Passive exposure: Past   Smokeless tobacco: Never   Tobacco comments:    Widowed, lives alone but has sig other.   Vaping Use   Vaping Use: Never used  Substance and Sexual Activity   Alcohol use: Not Currently   Drug use: No   Sexual activity: Yes    Birth control/protection: Post-menopausal  Other Topics Concern   Not on file  Social History Narrative   Widowed, husband died.    Lives alone but has significant other, married in 2016/01/01 to Jerome.    Had one son, but he passed away.    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 Has two grandsons.       Social Determinants of Health   Financial Resource Strain: Not on file  Food Insecurity: Not on file  Transportation Needs: Not on file  Physical Activity: Not on file  Stress: Not on file  Social Connections: Not on file     Family History:  The patient's family history includes Emphysema in her brother and father; Heart attack in her brother, sister, and son; Heart disease in her brother; Lung cancer in her brother; Throat cancer in her brother.   Review of Systems:    Please see the history of present illness.     All other systems reviewed and are otherwise negative except as noted above.   Physical Exam:    VS:  BP 130/72   Pulse 72   Ht '5\' 2"'$  (1.575 m)   Wt 134 lb 3.2 oz (60.9 kg)   SpO2 97%   BMI 24.55 kg/m    General: Well developed, well nourished,female appearing in no acute distress. Head: Normocephalic, atraumatic. Neck: No carotid bruits. JVD not elevated.  Lungs: Respirations regular and unlabored, without wheezes or rales.  Heart: Regular rate and rhythm. No S3 or S4.  No murmur, no rubs, or gallops appreciated. Abdomen: Appears non-distended. No obvious abdominal masses. Msk:  Strength and tone appear normal for age. No obvious joint deformities or effusions. Extremities: No clubbing or cyanosis. No pitting edema.  Distal  pedal pulses are 2+ bilaterally. Neuro: Alert and oriented X 3. Moves all extremities spontaneously. No focal deficits noted. Psych:  Responds to questions appropriately with a normal affect. Skin: No rashes or lesions noted  Wt Readings from Last 3 Encounters:  07/24/22 134 lb 3.2 oz (60.9 kg)  07/17/22 132 lb (59.9 kg)  06/06/22 137 lb 1.6 oz (62.2 kg)     Studies/Labs Reviewed:   EKG:  EKG is not ordered today.   Recent Labs: 08/10/2021: ALT 16 07/17/2022: BUN 16; Creatinine, Ser 0.96; Hemoglobin 14.1; Platelets 283; Potassium 3.9; Sodium 141   Lipid Panel    Component Value Date/Time   CHOL 202 (H) 10/04/2021 0956   TRIG 90 10/04/2021 0956   TRIG 119 05/16/2010 0000   HDL 67 10/04/2021 0956   CHOLHDL 3.0 10/04/2021 0956   VLDL 18 10/04/2021 0956   LDLCALC 117 (H) 10/04/2021 0956   LDLCALC 99 05/05/2019 1124   LDLDIRECT 144.4 11/09/2013 0934    Additional studies/ records that were reviewed today include:   LHC: 08/2021 Prox RCA lesion is 90% stenosed.   Mid RCA lesion is 20% stenosed.   Prox LAD to Mid LAD lesion is 70% stenosed.   Ost LAD to Prox LAD lesion is 80% stenosed.   Post intervention, there is a 0% residual stenosis.   Post intervention, there is a 0% residual stenosis.   Post intervention, there is a 0% residual stenosis.   The left ventricular systolic function is normal.   LV end diastolic pressure is normal.   The left ventricular ejection fraction is 55-65% by visual estimate.   Two-vessel coronary obstructive disease with in-stent restenosis of the proximal to mid LAD stent with narrowing of 80 and 70%,  and 90% in-stent restenosis of the previously placed proximal to mid RCA stent and a dominant RCA.  The left circumflex coronary artery is a large caliber normal vessel.   Normal LV function with EF estimated 65% without focal segmental wall motion abnormalities.  LVEDP 14 mmHg   Successful two-vessel coronary intervention utilizing a ScorFlex  2.5 x 10 mm cutting balloon and post stent dilatation with a 2.75 x 15 mm [place] balloon utilized in both the RCA and LAD vessels with the in-stent  stenosis in both vessels being reduced to 0% and brisk TIMI-3 flow.   RECOMMENDATION: DAPT for 6 to 12 months.  Medical therapy with optimal blood pressure control aggressive lipid management.  If patient cannot tolerate high potency statin therapy with possible concomitant Zetia, consider PCSK9 inhibition.   Carotid Dopplers: 04/2022 IMPRESSION: Mild calcified plaque of the carotid bifurcations bilaterally with Doppler measurements indicative of less than 50% stenosis.  Assessment:    1. Coronary artery disease involving native coronary artery of native heart without angina pectoris   2. Essential hypertension   3. Hyperlipidemia LDL goal <70   4. Lightheadedness      Plan:   In order of problems listed above:  1. CAD - She is s/p DES to RCA in 2009 with DES to mid-LAD in 2014 and most recent cath in 08/2021 showed ISR of LAD and RCA stents which were treated with cutting balloon angioplasty.  - She denies any recent exertional chest pain resembling angina and her episodes lasting for a few seconds at a time seem atypical for a cardiac etiology. - Continue current medical therapy with ASA 81 mg daily, Plavix 75 mg daily, Zetia 10 mg daily, Crestor 10 mg daily, Imdur 30 mg daily and  Toprol-XL 25 mg daily.  2. HTN - Her blood pressure is well-controlled at 130/72 during today's visit. She initially wished to come off some of her medications and we discussed stopping Amlodipine 2.5 mg daily but she wishes to remain on this for now. Therefore, will continue current medical therapy with Amlodipine 2.5 mg daily, Imdur 30 mg daily and Toprol-XL 25 mg daily.  3. HLD - FLP in 09/2021 showed her LDL was at 117 and Crestor was titrated but she was unable to tolerate this secondary to myalgias. She has since remained on Crestor 10 mg daily and Zetia 10  mg daily. Will recheck an FLP.   4. Lightheadedness/Vertigo - She has been experiencing worsening symptoms over the past few weeks and recently underwent ED evaluation as outlined above with Brain MRI showing no acute intracranial abnormalities. She was previously evaluated by ENT in [place] and I encouraged her to reach out to arrange follow-up since symptoms have persisted despite taking Meclizine 25 mg TID. Recent carotid dopplers were reassuring as outlined above and orthostatic vitals were checked today and negative.    Medication Adjustments/Labs and Tests Ordered: Current medicines are reviewed at length with the patient today.  Concerns regarding medicines are outlined above.  Medication changes, Labs and Tests ordered today are listed in the Patient Instructions below. Patient Instructions  Medication Instructions:  Your physician recommends that you continue on your current medications as directed. Please refer to the Current Medication list given to you today.   Labwork: Today: -FLP  Testing/Procedures: None  Follow-Up: Follow up with Bernerd Pho, PA-C or Dr. Domenic Polite in 6 months.   Any Other Special Instructions Will Be Listed Below (If Applicable).     If you need a refill on your cardiac medications before your next appointment, please call your pharmacy.    Signed, Erma Heritage, PA-C  07/24/2022 4:48 PM    [place] [address] [place], [place] [address] Phone: [phone] Fax: [phone]

## 2022-08-22 ENCOUNTER — Ambulatory Visit: Payer: Medicare Other | Admitting: Student

## 2022-09-02 ENCOUNTER — Other Ambulatory Visit: Payer: Self-pay | Admitting: Student

## 2022-09-04 ENCOUNTER — Telehealth: Payer: Self-pay | Admitting: Cardiology

## 2022-09-04 NOTE — Telephone Encounter (Signed)
Patients spouse notified and verbalized understanding.  

## 2022-09-04 NOTE — Telephone Encounter (Signed)
Med is generic, would recommend using GoodRx coupon. This brings down cost of ezetimibe to ~$28-30 for a 3 month supply if she fills at [facility] or [facility] (slightly higher ~$35 at [facility]).

## 2022-09-04 NOTE — Telephone Encounter (Signed)
I will forward to PharmD for advice.

## 2022-09-04 NOTE — Telephone Encounter (Signed)
Pt c/o medication issue:  1. Name of Medication: ezetimibe (ZETIA) 10 MG tablet   2. How are you currently taking this medication (dosage and times per day)? As prescribed  3. Are you having a reaction (difficulty breathing--STAT)? No  4. What is your medication issue? Pt states that pharmacy called and said insurance would no longer be covering this medication and they would like a call back to discuss other options. Please advise.

## 2022-11-27 ENCOUNTER — Encounter: Payer: Self-pay | Admitting: Gastroenterology

## 2022-12-08 ENCOUNTER — Other Ambulatory Visit: Payer: Self-pay | Admitting: Student

## 2022-12-18 ENCOUNTER — Telehealth: Payer: Self-pay | Admitting: *Deleted

## 2022-12-18 ENCOUNTER — Ambulatory Visit (INDEPENDENT_AMBULATORY_CARE_PROVIDER_SITE_OTHER): Payer: Medicare Other | Admitting: Gastroenterology

## 2022-12-18 ENCOUNTER — Encounter: Payer: Self-pay | Admitting: Gastroenterology

## 2022-12-18 VITALS — BP 177/79 | HR 66 | Temp 97.5°F | Ht 62.0 in | Wt 137.4 lb

## 2022-12-18 DIAGNOSIS — R1030 Lower abdominal pain, unspecified: Secondary | ICD-10-CM | POA: Diagnosis not present

## 2022-12-18 NOTE — Progress Notes (Signed)
Gastroenterology Office Note     Primary Care Physician:  [name], MD  Primary Gastroenterologist: Dr. Gala Romney    Chief Complaint   Chief Complaint  Patient presents with   Follow-up    Follow up abdominal pain. The pain goes and comes. Pt states the pain is sharp when it hurts. Not hurting right now (was this am). Passing a lot of gas     History of Present Illness   Dorothy Lee is an 82 y.o. female presenting today in follow-up with a history of constipation, GERD  perforated diverticulitis Jan 2019, undergoing elective colectomy in Aug 2019. She has a history of multiple polyps, with surveillance just completed in July 2022 with one tubular adenoma. No further surveillance due to age. She also underwent EGD July 2022 with mild Schatzki ring, s/p dilation. Small hiatal hernia. Normal duodenum    She has had abdominal pain intermittently.  CT abd/pelvis Oct 2023 with contrast showed colonic diverticulosis, otherwise unrevealing. She has not had any significant weight loss. Will have abdominal pain 2-3 times per week. Denies constipation. Takes Benefiber each morning.   Stomach feels hard at times. Decreased appetite. No postprandial pain. Dealing with dizziness 5 days out of 7. She has not been taking Cymbalta recently. Feels like  in a fog lately.     Past Medical History:  Diagnosis Date   Allergic rhinitis    CAD (coronary artery disease)    a. s/p DES to RCA in 2009 b.  DES to mid-LAD in 2014 c. 08/2021: ISR of LAD and RCA stents treated with cutting balloon angioplasty   Cancer [facility])    Diverticulitis    Diverticulosis    Essential hypertension    GERD (gastroesophageal reflux disease)    Glaucoma    History of breast cancer    Right - s/p XRT 1984, surgery, no chemo   Hyperlipidemia    Personal history of radiation therapy    Rotator cuff syndrome, left    Scoliosis    Urinary incontinence     Past Surgical History:  Procedure Laterality Date    [place]   right breast   COLONOSCOPY  2008   Dr. Oletta Lamas: diverticulosis, hemorrhoids, conscious sedation   COLONOSCOPY N/A 01/30/2016   Dr. Gala Romney: mild divertiuculosis in sigmoid colon. narrowing of the colon in association with the deverticular opening. 6 polyps removed, tubular adenoma. next TCS in 3 years   COLONOSCOPY WITH PROPOFOL N/A 04/17/2021   one 4 mm polyp at splenic flexure, otherwise normal. Tubular adenoma.   CORONARY ANGIOPLASTY WITH STENT PLACEMENT  02/2008   CORONARY BALLOON ANGIOPLASTY N/A 08/17/2021   Procedure: CORONARY BALLOON ANGIOPLASTY;  Surgeon: Troy Sine, MD;  Location: [place] [facility];  Service: Cardiovascular;  Laterality: N/A;   ESOPHAGOGASTRODUODENOSCOPY  2014   Dr. Fuller Plan: erosive gastritis, no h.pylori   ESOPHAGOGASTRODUODENOSCOPY N/A 01/30/2016   Dr. Gala Romney: normal esophagus s/p dilation, small hh.   ESOPHAGOGASTRODUODENOSCOPY (EGD) WITH PROPOFOL N/A 04/17/2021   mild Schatzki ring, s/p dilation. Small hiatal hernia. Normal duodenum.   LEFT HEART CATH AND CORONARY ANGIOGRAPHY N/A 08/17/2021   Procedure: LEFT HEART CATH AND CORONARY ANGIOGRAPHY;  Surgeon: Troy Sine, MD;  Location: [place] [facility];  Service: Cardiovascular;  Laterality: N/A;   LEFT HEART CATHETERIZATION WITH CORONARY ANGIOGRAM N/A 12/02/2012   Procedure: LEFT HEART CATHETERIZATION WITH CORONARY ANGIOGRAM;  Surgeon: Jettie Booze,  MD;  Location: [place] [facility];  Service: Cardiovascular;  Laterality: N/A;   MALONEY DILATION N/A 01/30/2016   Procedure: Venia Minks DILATION;  Surgeon: Daneil Dolin, MD;  [facility]: [facility];  Service: Endoscopy;  Laterality: N/A;   MALONEY DILATION N/A 04/17/2021   Procedure: Venia Minks DILATION;  Surgeon: Daneil Dolin, MD;  [facility]: [facility];  Service: Endoscopy;  Laterality: N/A;   NASAL SINUS SURGERY     PARTIAL COLECTOMY N/A 05/19/2018   Procedure: PARTIAL COLECTOMY;   Surgeon: Aviva Signs, MD;  Location: [facility];  Service: General;  Laterality: N/A;   PERCUTANEOUS CORONARY STENT INTERVENTION (PCI-S)  12/02/2012   Procedure: PERCUTANEOUS CORONARY STENT INTERVENTION (PCI-S);  Surgeon: Jettie Booze, MD;  Location: [facility];  Service: Cardiovascular;;  DES to the MID LAD   POLYPECTOMY  04/17/2021   Procedure: POLYPECTOMY;  Surgeon: Daneil Dolin, MD;  [facility];  Service: Endoscopy;;    Current Outpatient Medications  Medication Sig Dispense Refill   amLODipine (NORVASC) 2.5 MG tablet Take 2.5 mg by mouth daily.     aspirin EC 81 MG tablet Take 81 mg by mouth daily.      Baclofen 5 MG TABS Take 1 tablet by mouth daily.     clopidogrel (PLAVIX) 75 MG tablet TAKE 1 TABLET(75 MG) BY MOUTH DAILY WITH BREAKFAST 90 tablet 2   DULoxetine (CYMBALTA) 30 MG capsule Take 30 mg by mouth daily.     ezetimibe (ZETIA) 10 MG tablet TAKE 1 TABLET(10 MG) BY MOUTH DAILY 30 tablet 3   isosorbide mononitrate (IMDUR) 60 MG 24 hr tablet Take 0.5 tablets (30 mg total) by mouth daily. 45 tablet 3   latanoprost (XALATAN) 0.005 % ophthalmic solution Place 1 drop into both eyes at bedtime.      LORazepam (ATIVAN) 0.5 MG tablet Take 1 tablet (0.5 mg total) by mouth 3 (three) times daily as needed (vertigo). 10 tablet 0   meclizine (ANTIVERT) 25 MG tablet Take 1 tablet (25 mg total) by mouth 3 (three) times daily as needed for dizziness. 30 tablet 0   meloxicam (MOBIC) 15 MG tablet Take 15 mg by mouth daily.     metoprolol succinate (TOPROL-XL) 25 MG 24 hr tablet TAKE 1 TABLET BY MOUTH DAILY 90 tablet 2   nitroGLYCERIN (NITROSTAT) 0.4 MG SL tablet Place 1 tablet (0.4 mg total) under the tongue every 5 (five) minutes x 3 doses as needed for chest pain. May repeat x3 25 tablet 3   pantoprazole (PROTONIX) 40 MG tablet TAKE 1 TABLET(40 MG) BY MOUTH TWICE DAILY FOR 2 WEEKS, THEN GO BACK TO 1 TABLET(40 MG) ONCE DAILY 90 tablet 1   Rosuvastatin Calcium 10 MG CPSP Take 10  mg by mouth daily.     Wheat Dextrin (BENEFIBER PO) Take 1 Dose by mouth daily.     No current facility-administered medications for this visit.    Allergies as of 12/18/2022 - Review Complete 12/18/2022  Allergen Reaction Noted   Crestor [rosuvastatin]  08/11/2013   Lipitor [atorvastatin]  08/11/2013   Lisinopril Cough 02/16/2013   Simvastatin  08/11/2013    Family History  Problem Relation Age of Onset   Emphysema Father    Emphysema Brother    Heart disease Brother        x3 brothers   Lung cancer Brother    Throat cancer Brother        head/neck cancer   Heart attack Brother    Heart  attack Sister    Heart attack Son        blood clot   Colon cancer Neg Hx     Social History   Socioeconomic History   Marital status: Married    Spouse name: Not on file   Number of children: 1   Years of education: Not on file   Highest education level: Not on file  Occupational History   Occupation: hair stylist   Occupation: COSMETOLOGY    Employer: [employer]  Tobacco Use   Smoking status: Never    Passive exposure: Past   Smokeless tobacco: Never   Tobacco comments:    Widowed, lives alone but has sig other.   Vaping Use   Vaping Use: Never used  Substance and Sexual Activity   Alcohol use: Not Currently   Drug use: No   Sexual activity: Yes    Birth control/protection: Post-menopausal  Other Topics Concern   Not on file  Social History Narrative   Widowed, husband died.    Lives alone but has significant other, married in 01/20/2016 to [place].    Had one son, but he passed away.    Retired as Probation officer - Looking ahead [employer] and part-time at [employer]   Son died in January 20, 2016 of AMI. Has two grandsons.       Social Determinants of Health   Financial Resource Strain: Not on file  Food Insecurity: Not on file  Transportation Needs: Not on file  Physical Activity: Not on file  Stress: Not on file  Social Connections: Not on file  Intimate  Partner Violence: Not on file     Review of Systems   Gen: Denies any fever, chills, fatigue, weight loss, lack of appetite.  CV: Denies chest pain, heart palpitations, peripheral edema, syncope.  Resp: Denies shortness of breath at rest or with exertion. Denies wheezing or cough.  GI: Denies dysphagia or odynophagia. Denies jaundice, hematemesis, fecal incontinence. GU : Denies urinary burning, urinary frequency, urinary hesitancy MS: Denies joint pain, muscle weakness, cramps, or limitation of movement.  Derm: Denies rash, itching, dry skin Psych: Denies depression, anxiety, memory loss, and confusion Heme: Denies bruising, bleeding, and enlarged lymph nodes.   Physical Exam   BP (!) 177/79 Comment: pt has not taken her BP meds this am  Pulse 66   Temp (!) 97.5 F (36.4 C)   Ht 5\' 2"  (1.575 m)   Wt 137 lb 6.4 oz (62.3 kg)   BMI 25.13 kg/m  General:   Alert and oriented. Pleasant and cooperative. Well-nourished and well-developed.  Head:  Normocephalic and atraumatic. Eyes:  Without icterus Abdomen:  +BS, soft, non-tender and non-distended. No HSM noted. No guarding or rebound. No masses appreciated.  Rectal:  Deferred  Msk:  Symmetrical without gross deformities. Normal posture. Extremities:  Without edema. Neurologic:  Alert and  oriented x4;  grossly normal neurologically. Skin:  Intact without significant lesions or rashes. Psych:  Alert and cooperative. Normal mood and affect.   [place] is an 82 y.o. female presenting today in follow-up with a history of constipation, GERD  perforated diverticulitis Jan 2019, undergoing elective colectomy in Aug 2019. She has a history of multiple polyps, with surveillance just completed in July 2022 with one tubular adenoma. No further surveillance due to age. She also underwent EGD July 2022 with mild Schatzki ring, s/p dilation. Small hiatal hernia. Normal duodenum   Persistent chronic abdominal pain intermittent  in nature. CT Oct 2023 unrevealing, done for the same. No correlation with BMs, no constipation. Unclear triggers. She does note vague decreased appetite. Known atherosclerotic disease. Need to rule out CMI.  PLAN   CTA in future 6 month return Further recommendations following CTA   Annitta Needs, PhD, ANP-BC [facility]

## 2022-12-18 NOTE — Patient Instructions (Signed)
I have ordered a special CT to evaluate the vessels in your abdomen.  We will see you in 6 months!  Have a safe trip to [place]!  I enjoyed seeing you again today! At our first visit, I mentioned how I value our relationship and want to provide genuine, compassionate, and quality care. You may receive a survey regarding your visit with me, and I welcome your feedback! Thanks so much for taking the time to complete this. I look forward to seeing you again.   Annitta Needs, PhD, ANP-BC [facility] Gastroenterology

## 2022-12-18 NOTE — Telephone Encounter (Signed)
CT is scheduled for 02/08/23, arrive at 4:45 pm. NPO 4 hours prior.   [place]

## 2022-12-19 NOTE — Telephone Encounter (Signed)
Pt informed of CT date, time and instructions. Verbalized understanding.

## 2022-12-21 ENCOUNTER — Ambulatory Visit: Payer: Medicare Other | Attending: Student | Admitting: Cardiology

## 2022-12-21 NOTE — Progress Notes (Deleted)
    Cardiology Office Note  Date: 12/21/2022   ID: [name], DOB Oct 30, 1940, MRN [id number]  History of Present Illness: Dorothy Lee is an 82 y.o. female last seen in October 2023 by Ms. Strader PA-C, I reviewed the note.  Physical Exam: VS:  There were no vitals taken for this visit., BMI There is no height or weight on file to calculate BMI.  Wt Readings from Last 3 Encounters:  12/18/22 137 lb 6.4 oz (62.3 kg)  07/24/22 134 lb 3.2 oz (60.9 kg)  07/17/22 132 lb (59.9 kg)    General: Patient appears comfortable at rest. HEENT: Conjunctiva and lids normal, oropharynx clear with moist mucosa. Neck: Supple, no elevated JVP or carotid bruits, no thyromegaly. Lungs: Clear to auscultation, nonlabored breathing at rest. Cardiac: Regular rate and rhythm, no S3 or significant systolic murmur, no pericardial rub. Abdomen: Soft, nontender, no hepatomegaly, bowel sounds present, no guarding or rebound. Extremities: No pitting edema, distal pulses 2+. Skin: Warm and dry. Musculoskeletal: No kyphosis. Neuropsychiatric: Alert and oriented x3, affect grossly appropriate.  ECG:  An ECG dated 07/17/2022 was personally reviewed today and demonstrated:  Sinus rhythm with right bundle branch block and left anterior fascicular block.  Labwork: 07/17/2022: BUN 16; Creatinine, Ser 0.96; Hemoglobin 14.1; Platelets 283; Potassium 3.9; Sodium 141     Component Value Date/Time   CHOL 202 (H) 10/04/2021 0956   TRIG 90 10/04/2021 0956   TRIG 119 05/16/2010 0000   HDL 67 10/04/2021 0956   CHOLHDL 3.0 10/04/2021 0956   VLDL 18 10/04/2021 0956   LDLCALC 117 (H) 10/04/2021 0956   LDLCALC 99 05/05/2019 1124   LDLDIRECT 144.4 11/09/2013 0934   Other Studies Reviewed Today:  Carotid Doppler 04/26/2022: IMPRESSION: Mild calcified plaque of the carotid bifurcations bilaterally with Doppler measurements indicative of less than 50% stenosis.  Assessment and Plan:  1.  CAD status post DES to the  RCA in 2009, DES to the LAD in 2014, and Cutting Balloon angioplasty for in-stent restenosis within the LAD and RCA stent sites in November 2022.  2.  Essential hypertension.  3.  Mixed hyperlipidemia.  Disposition:  Follow up {follow up:15908}  Signed, Satira Sark, M.D., F.A.C.C.

## 2022-12-25 ENCOUNTER — Encounter: Payer: Self-pay | Admitting: Cardiology

## 2022-12-25 ENCOUNTER — Telehealth: Payer: Self-pay | Admitting: Cardiology

## 2022-12-25 NOTE — Telephone Encounter (Signed)
Appointment scheduled with Bernerd Pho, PA tomorrow at 1:00.

## 2022-12-25 NOTE — Progress Notes (Unsigned)
Cardiology Office Note:    Date:  12/25/2022  ID:  [name], DOB 03-22-1941, MRN [id number]  History of Present Illness:    Dorothy Lee is a 82 y.o. female with past medical history of CAD (s/p DES to RCA in 2009, DES to mid-LAD in 2014, s/p cath in 08/2021 showing ISR of LAD and RCA stents which were treated with cutting balloon angioplasty), HTN, HLD, GERD and history of breast cancer who presents to the office today for evaluation of variable BP.  She was last examined myself in 07/2022 and recently undergone multiple medication changes due to feeling like she was in a fog with amlodipine being discontinued and Imdur reduced to 30 mg daily.  Recent Brain MRI had shown no acute intracranial abnormalities and it was recommended that she follow-up with ENT for additional evaluation.  She was continued on her current cardiac medications including ASA 81 mg daily, Plavix 75 mg daily, Zetia 10 mg daily, Crestor 10 mg daily, Imdur 30 mg daily, Toprol-XL 25 mg daily and Amlodipine 2.5 mg daily.  She walked into the office on 12/24/2022 reporting issues with variable BP and a follow-up visit was therefore arranged.  ROS: ***  Studies Reviewed:    EKG:  ***  LHC: 08/2021 Prox RCA lesion is 90% stenosed.   Mid RCA lesion is 20% stenosed.   Prox LAD to Mid LAD lesion is 70% stenosed.   Ost LAD to Prox LAD lesion is 80% stenosed.   Post intervention, there is a 0% residual stenosis.   Post intervention, there is a 0% residual stenosis.   Post intervention, there is a 0% residual stenosis.   The left ventricular systolic function is normal.   LV end diastolic pressure is normal.   The left ventricular ejection fraction is 55-65% by visual estimate.   Two-vessel coronary obstructive disease with in-stent restenosis of the proximal to mid LAD stent with narrowing of 80 and 70%,  and 90% in-stent restenosis of the previously placed proximal to mid RCA stent and a dominant RCA.  The left  circumflex coronary artery is a large caliber normal vessel.   Normal LV function with EF estimated 65% without focal segmental wall motion abnormalities.  LVEDP 14 mmHg   Successful two-vessel coronary intervention utilizing a ScorFlex 2.5 x 10 mm cutting balloon and post stent dilatation with a 2.75 x 15 mm [place] balloon utilized in both the RCA and LAD vessels with the in-stent  stenosis in both vessels being reduced to 0% and brisk TIMI-3 flow.   RECOMMENDATION: DAPT for 6 to 12 months.  Medical therapy with optimal blood pressure control aggressive lipid management.  If patient cannot tolerate high potency statin therapy with possible concomitant Zetia, consider PCSK9 inhibition.    Carotid Dopplers: 04/2022 IMPRESSION: Mild calcified plaque of the carotid bifurcations bilaterally with Doppler measurements indicative of less than 50% stenosis.  Risk Assessment/Calculations:   {Does this patient have ATRIAL FIBRILLATION?:[phone]} No BP recorded.  {Refresh Note OR Click here to enter BP  :1}***        Physical Exam:   VS:  There were no vitals taken for this visit.   Wt Readings from Last 3 Encounters:  12/18/22 137 lb 6.4 oz (62.3 kg)  07/24/22 134 lb 3.2 oz (60.9 kg)  07/17/22 132 lb (59.9 kg)     GEN: Well nourished, well developed in no acute distress NECK: No JVD; No carotid bruits CARDIAC: ***RRR, no murmurs, rubs, gallops RESPIRATORY:  Clear to auscultation without rales, wheezing or rhonchi  ABDOMEN: Soft, non-tender, non-distended EXTREMITIES:  No edema; No deformity   ASSESSMENT AND PLAN:    1. CAD - She has a history of cardiac stents as outlined above with most recent intervention being in 08/2021. ***  2. HTN - ***  3. HLD - ***    Signed, Erma Heritage, PA-C

## 2022-12-26 ENCOUNTER — Ambulatory Visit: Payer: Medicare Other | Attending: Student | Admitting: Student

## 2022-12-26 ENCOUNTER — Encounter: Payer: Self-pay | Admitting: Student

## 2022-12-26 VITALS — BP 116/62 | HR 59 | Ht 62.0 in | Wt 137.0 lb

## 2022-12-26 DIAGNOSIS — E785 Hyperlipidemia, unspecified: Secondary | ICD-10-CM

## 2022-12-26 DIAGNOSIS — I10 Essential (primary) hypertension: Secondary | ICD-10-CM | POA: Diagnosis not present

## 2022-12-26 DIAGNOSIS — I2510 Atherosclerotic heart disease of native coronary artery without angina pectoris: Secondary | ICD-10-CM

## 2022-12-26 NOTE — Patient Instructions (Addendum)
Medication Instructions:   STOP AMLODIPINE. Follow blood pressure for 2 weeks and report back with readings.   Follow-Up:  2-3 months with Bernerd Pho, PA or Dr. Domenic Polite  If you need a refill on your cardiac medications before your next appointment, please call your pharmacy.

## 2023-01-02 ENCOUNTER — Ambulatory Visit: Payer: Medicare Other | Admitting: Student

## 2023-01-25 ENCOUNTER — Ambulatory Visit: Payer: Medicare Other | Admitting: Student

## 2023-02-06 ENCOUNTER — Other Ambulatory Visit: Payer: Self-pay | Admitting: Cardiology

## 2023-02-06 DIAGNOSIS — I10 Essential (primary) hypertension: Secondary | ICD-10-CM

## 2023-02-08 ENCOUNTER — Other Ambulatory Visit: Payer: Self-pay | Admitting: Student

## 2023-02-08 ENCOUNTER — Ambulatory Visit (HOSPITAL_COMMUNITY): Payer: Medicare Other

## 2023-04-04 ENCOUNTER — Ambulatory Visit: Payer: Medicare Other | Attending: Student | Admitting: Student

## 2023-04-04 ENCOUNTER — Encounter: Payer: Self-pay | Admitting: Student

## 2023-04-04 VITALS — BP 148/86 | HR 70 | Ht 62.0 in | Wt 133.4 lb

## 2023-04-04 DIAGNOSIS — E785 Hyperlipidemia, unspecified: Secondary | ICD-10-CM | POA: Insufficient documentation

## 2023-04-04 DIAGNOSIS — I25118 Atherosclerotic heart disease of native coronary artery with other forms of angina pectoris: Secondary | ICD-10-CM | POA: Insufficient documentation

## 2023-04-04 DIAGNOSIS — I10 Essential (primary) hypertension: Secondary | ICD-10-CM | POA: Insufficient documentation

## 2023-04-04 MED ORDER — MECLIZINE HCL 25 MG PO TABS: 25.0000 mg | ORAL_TABLET | Freq: Three times a day (TID) | ORAL | 3 refills | Status: DC | PRN

## 2023-04-04 MED ORDER — ISOSORBIDE MONONITRATE ER 60 MG PO TB24: 60.0000 mg | ORAL_TABLET | Freq: Every day | ORAL | 3 refills | Status: DC

## 2023-04-04 MED ORDER — NITROGLYCERIN 0.4 MG SL SUBL: 0.4000 mg | SUBLINGUAL_TABLET | SUBLINGUAL | 3 refills | Status: AC | PRN

## 2023-04-04 NOTE — Progress Notes (Signed)
Cardiology Office Note    Date:  04/04/2023  ID:  AMBERLEA SPAGNUOLO, DOB 1941/10/07, MRN [id number] Cardiologist: Nona Dell, MD    History of Present Illness:    Dorothy Lee is a 82 y.o. female with past medical history of CAD (s/p DES to RCA in 2009, DES to mid-LAD in 2014, s/p cath in 08/2021 showing ISR of LAD and RCA stents which were treated with cutting balloon angioplasty), HTN, HLD, GERD and history of breast cancer who presents to the office today for 48-month follow-up.   She was examined by myself in 12/2022 for reported still having episodes of feeling "in a fog" which had been occurring for over a year and she had undergone extensive workup in the past.  She felt like symptoms were due to being overmedicated and she denied any specific angina. It was felt that her medications were likely not the cause of this but could try taking a drug holiday to see if symptoms improved. In regards to BP, she was currently taking Amlodipine 2.5 mg daily, Imdur 30 mg daily and Toprol-XL 25 mg daily. It was recommended to stop Amlodipine and keep a BP log for 2 weeks. If symptoms did not improve, could attempt a trial off of Toprol-XL.  In talking with the patient today, she reports having occasional, brief episodes of chest discomfort which have been intermittent over the past few months. Reports they spontaneously resolve but she did take a nitroglycerin with 1 and this helped with symptoms. She is unsure if symptoms are related to acid reflux or her heart as reports symptoms previously improved with use of PPI therapy and current episodes do not resemble her prior angina. She is still having episodic dizziness and was recently reevaluated by ENT earlier this month with no culprit identified. She is unsure if she is currently taking Amlodipine or not as this was held at time of her last office visit. She denies any dyspnea on exertion, orthopnea, PND or pitting edema.  Studies Reviewed:   EKG: EKG  is not ordered today. EKG from 07/17/2022 is reviewed and shows NSR, HR 66 with RBBB and LAFB.   LHC: 08/2021   Prox RCA lesion is 90% stenosed.   Mid RCA lesion is 20% stenosed.   Prox LAD to Mid LAD lesion is 70% stenosed.   Ost LAD to Prox LAD lesion is 80% stenosed.   Post intervention, there is a 0% residual stenosis.   Post intervention, there is a 0% residual stenosis.   Post intervention, there is a 0% residual stenosis.   The left ventricular systolic function is normal.   LV end diastolic pressure is normal.   The left ventricular ejection fraction is 55-65% by visual estimate.   Two-vessel coronary obstructive disease with in-stent restenosis of the proximal to mid LAD stent with narrowing of 80 and 70%,  and 90% in-stent restenosis of the previously placed proximal to mid RCA stent and a dominant RCA.  The left circumflex coronary artery is a large caliber normal vessel.   Normal LV function with EF estimated 65% without focal segmental wall motion abnormalities.  LVEDP 14 mmHg   Successful two-vessel coronary intervention utilizing a ScorFlex 2.5 x 10 mm cutting balloon and post stent dilatation with a 2.75 x 15 mm [place] balloon utilized in both the RCA and LAD vessels with the in-stent  stenosis in both vessels being reduced to 0% and brisk TIMI-3 flow.   RECOMMENDATION: DAPT for 6 to  12 months.  Medical therapy with optimal blood pressure control aggressive lipid management.  If patient cannot tolerate high potency statin therapy with possible concomitant Zetia, consider PCSK9 inhibition.  Carotid Dopplers: 04/2022 IMPRESSION: Mild calcified plaque of the carotid bifurcations bilaterally with Doppler measurements indicative of less than 50% stenosis.   Physical Exam:   VS:  BP (!) 148/86   Pulse 70   Ht 5\' 2"  (1.575 m)   Wt 133 lb 6.4 oz (60.5 kg)   SpO2 97%   BMI 24.40 kg/m    Wt Readings from Last 3 Encounters:  04/04/23 133 lb 6.4 oz (60.5 kg)  12/26/22 137 lb  (62.1 kg)  12/18/22 137 lb 6.4 oz (62.3 kg)     GEN: Pleasant, elderly female appearing in no acute distress NECK: No JVD; No carotid bruits CARDIAC: RRR, no murmurs, rubs, gallops RESPIRATORY:  Clear to auscultation without rales, wheezing or rhonchi  ABDOMEN: Appears non-distended. No obvious abdominal masses. EXTREMITIES: No clubbing or cyanosis. No pitting edema.  Distal pedal pulses are 2+ bilaterally.   Assessment and Plan:   1. CAD - She is s/p DES to RCA in 2009, DES to mid-LAD in 2014 and cath in 08/2021 showed ISR of LAD and RCA stents which was treated with cutting balloon angioplasty. - She reports having occasional episodes of chest pain which occur at rest and spontaneously resolve. She is unsure if symptoms are due to acid reflux or due to her heart but do not resemble her prior angina.  - We discussed options in regards to ischemic evaluation with a follow-up Lexiscan Myoview and at this time, she prefers medical therapy. Therefore, will titrate Imdur from 30 mg daily to 60 mg daily. I encouraged her to report back if symptoms do not improve within the next 1 to 2 weeks as we can arrange for stress testing. Would have a low threshold for repeat catheterization given her history of ISR. - Continue ASA 81 mg daily, Plavix 75 mg daily (on long-term DAPT given her stent burden and history of ISR), Crestor 10 mg daily and Toprol-XL 25 mg daily.  2. HTN - Her blood pressure is elevated at 148/86 during today's visit. She will call back to confirm if she is taking Amlodipine. Will plan to titrate Imdur as discussed above which should help with BP control. Continue Toprol-XL 25 mg daily.  3. HLD - We will request a copy of most recent labs from her PCP.  She was previously intolerant to higher-intensity statin therapy but has tolerated Crestor 10 mg daily.  Signed, Ellsworth Lennox, PA-C

## 2023-04-04 NOTE — Patient Instructions (Signed)
Medication Instructions:  Your physician has recommended you make the following change in your medication:   -Increase Imdur to 60 mg tablets once daily.   *If you need a refill on your cardiac medications before your next appointment, please call your pharmacy*   Lab Work: None If you have labs (blood work) drawn today and your tests are completely normal, you will receive your results only by: MyChart Message (if you have MyChart) OR A paper copy in the mail If you have any lab test that is abnormal or we need to change your treatment, we will call you to review the results.   Testing/Procedures: None   Follow-Up: At [facility], you and your health needs are our priority.  As part of our continuing mission to provide you with exceptional heart care, we have created designated Provider Care Teams.  These Care Teams include your primary Cardiologist (physician) and Advanced Practice Providers (APPs -  Physician Assistants and Nurse Practitioners) who all work together to provide you with the care you need, when you need it.  We recommend signing up for the patient portal called "MyChart".  Sign up information is provided on this After Visit Summary.  MyChart is used to connect with patients for Virtual Visits (Telemedicine).  Patients are able to view lab/test results, encounter notes, upcoming appointments, etc.  Non-urgent messages can be sent to your provider as well.   To learn more about what you can do with MyChart, go to [email or web address].    Your next appointment:   3 month(s)  Provider:   You may see Nona Dell, MD or one of the following Advanced Practice Providers on your designated Care Team:   Randall An, [place]    Other Instructions

## 2023-05-20 ENCOUNTER — Other Ambulatory Visit (HOSPITAL_COMMUNITY): Payer: Self-pay | Admitting: Family Medicine

## 2023-05-20 DIAGNOSIS — Z1231 Encounter for screening mammogram for malignant neoplasm of breast: Secondary | ICD-10-CM

## 2023-05-26 ENCOUNTER — Other Ambulatory Visit: Payer: Self-pay | Admitting: Cardiology

## 2023-05-27 ENCOUNTER — Ambulatory Visit (HOSPITAL_COMMUNITY)
Admission: RE | Admit: 2023-05-27 | Discharge: 2023-05-27 | Disposition: A | Discharge status: Home / Self Care | Payer: Medicare Other | Source: Ambulatory Visit | Attending: Family Medicine | Admitting: Family Medicine

## 2023-05-27 DIAGNOSIS — Z1231 Encounter for screening mammogram for malignant neoplasm of breast: Secondary | ICD-10-CM | POA: Insufficient documentation

## 2023-05-27 MED ORDER — CLOPIDOGREL BISULFATE 75 MG PO TABS: 75.0000 mg | ORAL_TABLET | Freq: Every day | ORAL | 0 refills | Status: DC

## 2023-05-27 NOTE — Addendum Note (Signed)
Addended by: Sharen Hones on: 05/27/2023 03:23 PM   Modules accepted: Orders

## 2023-06-06 ENCOUNTER — Encounter: Payer: Self-pay | Admitting: Gastroenterology

## 2023-06-13 ENCOUNTER — Encounter: Payer: Self-pay | Admitting: Obstetrics & Gynecology

## 2023-06-13 ENCOUNTER — Ambulatory Visit (INDEPENDENT_AMBULATORY_CARE_PROVIDER_SITE_OTHER): Payer: Medicare Other | Admitting: Obstetrics & Gynecology

## 2023-06-13 VITALS — BP 166/89 | HR 64 | Ht 62.0 in | Wt 137.8 lb

## 2023-06-13 DIAGNOSIS — N8110 Cystocele, unspecified: Secondary | ICD-10-CM | POA: Diagnosis not present

## 2023-06-13 DIAGNOSIS — N3941 Urge incontinence: Secondary | ICD-10-CM | POA: Diagnosis not present

## 2023-06-13 MED ORDER — GEMTESA 75 MG PO TABS
75.0000 mg | ORAL_TABLET | Freq: Every day | ORAL | 11 refills | Status: DC
Start: 2023-06-13 — End: 2023-06-20

## 2023-06-13 NOTE — Progress Notes (Signed)
GYN VISIT Patient name: Dorothy Lee MRN [id number]  Date of birth: 01/22/1941 Chief Complaint:   Urinary Incontinence  History of Present Illness:   Dorothy Lee is a 82 y.o. G2P1010 PM female being seen today for the following concerns:  Urinary incontinence:  For the past 3 mos, notes continued leaking.  Denies stress incontinence.  Notes urge incontinence and urinary urgency.  Notes up to void a few times per night.    She also reports considerable back issues and is concerned that this may be related  Records reviewed- pt seen by Urogyn- stage 1 prolapse and Rx for Myrbetrieq sent in.  Pt states due to cost, she was not able to fill prescription  No LMP recorded. Patient is postmenopausal.    Review of Systems:   Pertinent items are noted in HPI Denies fever/chills, dizziness, headaches, visual disturbances, fatigue, shortness of breath, chest pain, abdominal pain, vomiting Pertinent History Reviewed:   Past Surgical History:  Procedure Laterality Date   APPENDECTOMY  1981   BREAST BIOPSY  1984   BREAST LUMPECTOMY  1982   right breast   COLONOSCOPY  2008   Dr. Randa Evens: diverticulosis, hemorrhoids, conscious sedation   COLONOSCOPY N/A 01/30/2016   Dr. Jena Gauss: mild divertiuculosis in sigmoid colon. narrowing of the colon in association with the deverticular opening. 6 polyps removed, tubular adenoma. next TCS in 3 years   COLONOSCOPY WITH PROPOFOL N/A 04/17/2021   one 4 mm polyp at splenic flexure, otherwise normal. Tubular adenoma.   CORONARY ANGIOPLASTY WITH STENT PLACEMENT  02/2008   CORONARY BALLOON ANGIOPLASTY N/A 08/17/2021   Procedure: CORONARY BALLOON ANGIOPLASTY;  Surgeon: Lennette Bihari, MD;  Location: [facility] INVASIVE CV LAB;  Service: Cardiovascular;  Laterality: N/A;   ESOPHAGOGASTRODUODENOSCOPY  2014   Dr. Russella Dar: erosive gastritis, no h.pylori   ESOPHAGOGASTRODUODENOSCOPY N/A 01/30/2016   Dr. Jena Gauss: normal esophagus s/p dilation, small hh.    ESOPHAGOGASTRODUODENOSCOPY (EGD) WITH PROPOFOL N/A 04/17/2021   mild Schatzki ring, s/p dilation. Small hiatal hernia. Normal duodenum.   LEFT HEART CATH AND CORONARY ANGIOGRAPHY N/A 08/17/2021   Procedure: LEFT HEART CATH AND CORONARY ANGIOGRAPHY;  Surgeon: Lennette Bihari, MD;  Location: [facility] INVASIVE CV LAB;  Service: Cardiovascular;  Laterality: N/A;   LEFT HEART CATHETERIZATION WITH CORONARY ANGIOGRAM N/A 12/02/2012   Procedure: LEFT HEART CATHETERIZATION WITH CORONARY ANGIOGRAM;  Surgeon: Corky Crafts, MD;  Location: [facility] CATH LAB;  Service: Cardiovascular;  Laterality: N/A;   MALONEY DILATION N/A 01/30/2016   Procedure: Elease Hashimoto DILATION;  Surgeon: Corbin Ade, MD;  Location: [facility] ENDO [facility];  Service: Endoscopy;  Laterality: N/A;   MALONEY DILATION N/A 04/17/2021   Procedure: Elease Hashimoto DILATION;  Surgeon: Corbin Ade, MD;  Location: [facility] END[facility];  Service: Endoscopy;  Laterality: N/A;   NASAL SINUS SURGERY     PARTIAL COLECTOMY N/A 05/19/2018   Procedure: PARTIAL COLECTOMY;  Surgeon: Franky Macho, MD;  Location: [facility] ORS;  Service: General;  Laterality: N/A;   PERCUTANEOUS CORONARY STENT INTERVENTION (PCI-S)  12/02/2012   Procedure: PERCUTANEOUS CORONARY STENT INTERVENTION (PCI-S);  Surgeon: Corky Crafts, MD;  Location: [facility] CATH LAB;  Service: Cardiovascular;;  DES to the MID LAD   POLYPECTOMY  04/17/2021   Procedure: POLYPECTOMY;  Surgeon: Corbin Ade, MD;  Location: [facility];  Service: Endoscopy;;    Past Medical History:  Diagnosis Date   Allergic rhinitis    CAD (coronary artery disease)    a. s/p DES to RCA in  2009 b.  DES to mid-LAD in 2014 c. 08/2021: ISR of LAD and RCA stents treated with cutting balloon angioplasty   Cancer [facility])    Diverticulitis    Diverticulosis    Essential hypertension    GERD (gastroesophageal reflux disease)    Glaucoma    History of breast cancer    Right - s/p XRT 1984, surgery, no chemo   Hyperlipidemia    Personal  history of radiation therapy    Rotator cuff syndrome, left    Scoliosis    Urinary incontinence    Reviewed problem list, medications and allergies. Physical Assessment:   Vitals:   06/13/23 1448  BP: (!) 166/89  Pulse: 64  Weight: 137 lb 12.8 oz (62.5 kg)  Height: 5\' 2"  (1.575 m)  Body mass index is 25.2 kg/m.       Physical Examination:   General appearance: alert, well appearing, and in no distress  Psych: mood appropriate, normal affect  Skin: warm & dry   Cardiovascular: normal heart rate noted  Respiratory: normal respiratory effort, no distress  Abdomen: soft, non-tender   Pelvic: VULVA: normal appearing vulva with no masses, tenderness or lesions, VAGINA: normal appearing vagina with normal color and discharge, no lesions.  Atrophic changes noted.  Stage 1 cystocele noted. CERVIX: normal appearing cervix without discharge or lesions, UTERUS: uterus is normal size, shape, consistency and nontender, ADNEXA: normal adnexa in size, nontender and no masses  Extremities: no edema   Chaperone: Faith Rogue    Assessment & Plan:  1) Urge incontinence, Cystocele -reviewed with pt prior recommendation -will send in Gemtesa for possible better coverage -UA and culture sent to r/o underlying infection -f/u in 3 mos   Meds ordered this encounter  Medications   Vibegron (GEMTESA) 75 MG TABS    Sig: Take 1 tablet (75 mg total) by mouth daily.    Dispense:  30 tablet    Refill:  11      Orders Placed This Encounter  Procedures   Urine Culture   Urinalysis    Return in about 3 months (around 09/12/2023) for Medication follow up.   Myna Hidalgo, DO Attending Obstetrician & Gynecologist, [facility] for [employer], [facility]

## 2023-06-14 LAB — URINALYSIS
Bilirubin, UA: NEGATIVE
Glucose, UA: NEGATIVE
Ketones, UA: NEGATIVE
Leukocytes,UA: NEGATIVE
Nitrite, UA: NEGATIVE
Protein,UA: NEGATIVE
RBC, UA: NEGATIVE
Specific Gravity, UA: 1.023 (ref 1.005–1.030)
Urobilinogen, Ur: 0.2 mg/dL (ref 0.2–1.0)
pH, UA: 6.5 (ref 5.0–7.5)

## 2023-06-15 LAB — URINE CULTURE

## 2023-06-17 ENCOUNTER — Telehealth: Payer: Self-pay | Admitting: Obstetrics & Gynecology

## 2023-06-17 NOTE — Telephone Encounter (Signed)
Called pt, notes improvement with samples and would like to continue with this medication  Pt plans to call insurance to find out about coverage  Myna Hidalgo, DO Attending Obstetrician & Gynecologist, [facility] for [employer], [facility]

## 2023-06-17 NOTE — Telephone Encounter (Signed)
Pt's states Vibegron is too expensive. Pt wants another prescription that is less expensive or more samples. Please call and advise.

## 2023-06-18 ENCOUNTER — Other Ambulatory Visit: Payer: Self-pay | Admitting: Student

## 2023-06-19 ENCOUNTER — Ambulatory Visit: Payer: Medicare Other | Admitting: Neurology

## 2023-06-20 ENCOUNTER — Telehealth: Payer: Self-pay | Admitting: Obstetrics & Gynecology

## 2023-06-20 DIAGNOSIS — N3941 Urge incontinence: Secondary | ICD-10-CM

## 2023-06-20 MED ORDER — TROSPIUM CHLORIDE 20 MG PO TABS: 20.0000 mg | ORAL_TABLET | Freq: Two times a day (BID) | ORAL | 3 refills | Status: AC

## 2023-06-20 NOTE — Telephone Encounter (Signed)
Pt states she would like Trospium 60mg  called into her pharmacy. The other medication was too expensive. Please advise.

## 2023-06-20 NOTE — Telephone Encounter (Signed)
Tried calling and no answer.  Could you please tell the patient the following:  We can try this medication instead.  Of note, this medication is a different type and common side effects are dry eyes, dry mouth and constipation.  Should you note significant side effects please let us know.

## 2023-06-20 NOTE — Addendum Note (Signed)
Addended by: Sharon Seller on: 06/20/2023 05:12 PM   Modules accepted: Orders

## 2023-06-21 NOTE — Telephone Encounter (Signed)
Called patient to inform her Dr Charlotta Newton sent in requested medication however, this medication is a different type and common side effects are dry eyes, dry mouth and constipation. Should she note significant side effects please let us know. Pt verbalized understanding with no further questions.

## 2023-07-16 ENCOUNTER — Ambulatory Visit: Payer: Medicare Other | Attending: Cardiology | Admitting: Cardiology

## 2023-07-16 ENCOUNTER — Encounter: Payer: Self-pay | Admitting: Cardiology

## 2023-07-16 VITALS — BP 180/78 | HR 62 | Ht 62.0 in | Wt 137.6 lb

## 2023-07-16 DIAGNOSIS — I25118 Atherosclerotic heart disease of native coronary artery with other forms of angina pectoris: Secondary | ICD-10-CM

## 2023-07-16 DIAGNOSIS — I10 Essential (primary) hypertension: Secondary | ICD-10-CM

## 2023-07-16 DIAGNOSIS — I25119 Atherosclerotic heart disease of native coronary artery with unspecified angina pectoris: Secondary | ICD-10-CM | POA: Diagnosis present

## 2023-07-16 DIAGNOSIS — E782 Mixed hyperlipidemia: Secondary | ICD-10-CM

## 2023-07-16 MED ORDER — CHLORTHALIDONE 25 MG PO TABS
12.5000 mg | ORAL_TABLET | Freq: Every day | ORAL | 3 refills | Status: DC
Start: 2023-07-16 — End: 2024-01-21

## 2023-07-16 NOTE — Patient Instructions (Signed)
Medication Instructions:   START Chlorthalidone 12.5 mg daily   Labwork: Fasting Lipids and BMET in 2 weeks at [facility]  Testing/Procedures: None today  Follow-Up: 3 months  Any Other Special Instructions Will Be Listed Below (If Applicable).  If you need a refill on your cardiac medications before your next appointment, please call your pharmacy.

## 2023-07-16 NOTE — Progress Notes (Signed)
    Cardiology Office Note  Date: 07/16/2023   ID: Dorothy Lee, DOB 1941-04-23, MRN [id number]  History of Present Illness: Dorothy Lee is an 81 y.o. female last seen in June by Ms. Strader PA-C, I reviewed the note.  She is here for a routine visit.  Reports intermittent angina without progressive pattern, has used 2 nitroglycerin tablets in the last 3 months.  She is tolerating the increase in Imdur made at last visit.  Does report mild ankle edema at times.  Blood pressure also elevated as before.  I reviewed her medications, current cardiovascular regimen includes aspirin, Plavix, Imdur, Toprol-XL, Crestor, and as needed nitroglycerin.  She was taken off Norvasc previously given intolerance.  ECG today shows sinus rhythm with right bundle branch block and left anterior fascicular block.  Physical Exam: VS:  BP (!) 158/86 (BP Location: Left Arm, Patient Position: Sitting, Cuff Size: Normal)   Pulse 62   Ht 5\' 2"  (1.575 m)   Wt 137 lb 9.6 oz (62.4 kg)   SpO2 90%   BMI 25.17 kg/m , BMI Body mass index is 25.17 kg/m.  Wt Readings from Last 3 Encounters:  07/16/23 137 lb 9.6 oz (62.4 kg)  06/13/23 137 lb 12.8 oz (62.5 kg)  04/04/23 133 lb 6.4 oz (60.5 kg)    General: Patient appears comfortable at rest. HEENT: Conjunctiva and lids normal. Neck: Supple, no elevated JVP or carotid bruits. Lungs: Clear to auscultation, nonlabored breathing at rest. Cardiac: Regular rate and rhythm, no S3 or significant systolic murmur. Extremities: Trace ankle edema.  ECG:  An ECG dated 07/17/2022 was personally reviewed today and demonstrated:  Sinus rhythm with right bundle branch block and left anterior fascicular block.  Labwork: 07/17/2022: BUN 16; Creatinine, Ser 0.96; Hemoglobin 14.1; Platelets 283; Potassium 3.9; Sodium 141   Other Studies Reviewed Today:  Carotid Dopplers 04/26/2022: IMPRESSION: Mild calcified plaque of the carotid bifurcations bilaterally with Doppler  measurements indicative of less than 50% stenosis.  Assessment and Plan:  1.  CAD status post DES to the RCA in 2009, DES to the mid LAD in 2014, and Cutting Balloon angioplasty due to in-stent restenosis of the LAD and RCA in November 2022.  She reports intermittent angina although no increasing pattern or escalating nitroglycerin use.  Continue observation for now on aspirin, Plavix, Imdur, Toprol-XL, Crestor, and as needed nitroglycerin.  2.  Primary hypertension.  Start chlorthalidone 12.5 mg daily.  Follow-up BMET in 2 weeks.  3.  Mixed hyperlipidemia.  She continues on Crestor 10 mg daily.  Recheck FLP with next lab work in 2 weeks.  Disposition:  Follow up  3 months.  Signed, Jonelle Sidle, M.D., F.A.C.C. [facility] at [facility]

## 2023-07-24 ENCOUNTER — Telehealth: Payer: Self-pay | Admitting: *Deleted

## 2023-07-24 NOTE — Telephone Encounter (Signed)
   Pre-operative Risk Assessment    Patient Name: Dorothy Lee  DOB: 06-Aug-1941 MRN: [id number]    DATE OF LAST VISIT: 07/16/23 DR. McDOWELL DATE OF NEXT VISIT: 10/16/23 DR. McDOWELL  Request for Surgical Clearance    Procedure:   ESI-EPIDURAL STEROID INJECTION  Date of Surgery:  Clearance TBD                                 Surgeon:  DR. Lorrine Kin Surgeon's Group or Practice Name:  [facility] Phone number:  [phone] Fax number:  [phone]   Type of Clearance Requested:   - Medical  - Pharmacy:  Hold Clopidogrel (Plavix) x 7 DAYS PRIOR  AND RESUME THE NEXT DAY POST PROCEDURE   Type of Anesthesia:  Not Indicated   Additional requests/questions:    Elpidio Anis   07/24/2023, 12:42 PM

## 2023-07-24 NOTE — Telephone Encounter (Signed)
   Patient Name: Dorothy Lee  DOB: May 24, 1941 MRN: [id number]  Primary Cardiologist: Nona Dell, MD  Chart reviewed as part of pre-operative protocol coverage. Pre-op clearance already addressed by colleagues in earlier phone notes. To summarize recommendations:  - She was clinically stable as of most recent visit without escalating angina.  She will not be able to undergo an epidural steroid injection without holding antiplatelet therapy.  Unless she has had a change in her cardiac status since I saw her, she may hold Plavix for 7 days as requested (typically we recommend 5), and she is also on aspirin which would need to be held during the 7 days as well I presume.  -Dr. Diona Browner   Will route this bundled recommendation to requesting provider via Epic fax function and remove from pre-op pool. Please call with questions.  Sharlene Dory, PA-C 07/24/2023, 1:20 PM

## 2023-08-01 ENCOUNTER — Other Ambulatory Visit (HOSPITAL_COMMUNITY)
Admission: RE | Admit: 2023-08-01 | Discharge: 2023-08-01 | Disposition: A | Discharge status: Home / Self Care | Payer: Medicare Other | Source: Ambulatory Visit | Attending: Cardiology | Admitting: Cardiology

## 2023-08-01 ENCOUNTER — Telehealth: Payer: Self-pay

## 2023-08-01 DIAGNOSIS — I25118 Atherosclerotic heart disease of native coronary artery with other forms of angina pectoris: Secondary | ICD-10-CM | POA: Diagnosis present

## 2023-08-01 LAB — BASIC METABOLIC PANEL
Anion gap: 10 (ref 5–15)
BUN: 19 mg/dL (ref 8–23)
CO2: 30 mmol/L (ref 22–32)
Calcium: 9.2 mg/dL (ref 8.9–10.3)
Chloride: 100 mmol/L (ref 98–111)
Creatinine, Ser: 1.03 mg/dL — ABNORMAL HIGH (ref 0.44–1.00)
GFR, Estimated: 54 mL/min — ABNORMAL LOW (ref >60)
Glucose, Bld: 106 mg/dL — ABNORMAL HIGH (ref 70–99)
Potassium: 4.2 mmol/L (ref 3.5–5.1)
Sodium: 140 mmol/L (ref 135–145)

## 2023-08-01 LAB — LIPID PANEL
Cholesterol: 177 mg/dL (ref 0–200)
HDL: 42 mg/dL (ref >40)
LDL Cholesterol: 106 mg/dL — ABNORMAL HIGH (ref 0–99)
Total CHOL/HDL Ratio: 4.2 {ratio}
Triglycerides: 146 mg/dL (ref <150)
VLDL: 29 mg/dL (ref 0–40)

## 2023-08-01 MED ORDER — EZETIMIBE 10 MG PO TABS
10.0000 mg | ORAL_TABLET | Freq: Every day | ORAL | 3 refills | Status: DC
Start: 2023-08-01 — End: 2023-08-27

## 2023-08-01 NOTE — Telephone Encounter (Signed)
-----   Message from Nona Dell sent at 08/01/2023  1:15 PM EDT ----- Results reviewed.  LDL 106 on Crestor 10 mg daily.  She has prior statin intolerance on higher doses.  See if she would be willing to start Zetia 10 mg daily as adjunctive therapy to her Crestor as this may help get her LDL closer to goal.

## 2023-08-01 NOTE — Telephone Encounter (Signed)
Test results discussed with patient.She is agreeable to add zetia 10 mg daily.E-scribed to [facility] [facility] in [place] I will forward results to pcp

## 2023-08-20 ENCOUNTER — Ambulatory Visit (INDEPENDENT_AMBULATORY_CARE_PROVIDER_SITE_OTHER): Payer: Medicare Other | Admitting: Diagnostic Neuroimaging

## 2023-08-20 ENCOUNTER — Encounter: Payer: Self-pay | Admitting: Diagnostic Neuroimaging

## 2023-08-20 VITALS — BP 143/70 | HR 72 | Ht 62.0 in | Wt 131.2 lb

## 2023-08-20 DIAGNOSIS — R270 Ataxia, unspecified: Secondary | ICD-10-CM

## 2023-08-20 DIAGNOSIS — R531 Weakness: Secondary | ICD-10-CM

## 2023-08-20 DIAGNOSIS — R42 Dizziness and giddiness: Secondary | ICD-10-CM

## 2023-08-20 NOTE — Patient Instructions (Signed)
  BRAIN FOG / MALAISE - check MRI brain  B12 deficiency - repeat B12 level (was 142 in April 2024; did not start replacement yet) - start B12 daily  MILD DYSTHYMIA - follow up with PCP; consider psychology

## 2023-08-20 NOTE — Progress Notes (Signed)
[facility]  PATIENT: Dorothy Lee DOB: Aug 31, 1941  REFERRING CLINICIAN: Aliene Beams, MD HISTORY FROM: patient  REASON FOR VISIT: new consult   HISTORICAL  CHIEF COMPLAINT:  Chief Complaint  Patient presents with   New Patient (Initial Visit)    Patient in room #6 and alone. Patient states she been having a vertigo feeling for over a year. Patient states she had a fall about three years ago and her eye stay blood shot. Patient states she some imbalance issues.    HISTORY OF PRESENT ILLNESS:   82 year old female here for evaluation of abnormal sensation.  For past 2 to 3 years patient has felt off, brain fog sensation, decreased energy, malaise and dizziness sensation.  She feels like her balance is off and her legs are weak.  Having low energy.  Denies any spinning or vertigo sensation.  Denies any lightheadedness or fainting spells.  No prodromal accidents injuries or traumas.   REVIEW OF SYSTEMS: Full 14 system review of systems performed and negative with exception of: as per HPI.  ALLERGIES: Allergies  Allergen Reactions   Crestor [Rosuvastatin]     Crestor 20 mg caused muscle aches - pt is able to tolerate lower doses (takes 10mg )   Lipitor [Atorvastatin]     Atorvastatin 40 mg caused fatigue   Lisinopril Cough   Simvastatin     Muscle aches    HOME MEDICATIONS: Outpatient Medications Prior to Visit  Medication Sig Dispense Refill   aspirin EC 81 MG tablet Take 81 mg by mouth daily.      chlorthalidone (HYGROTON) 25 MG tablet Take 0.5 tablets (12.5 mg total) by mouth daily. 45 tablet 3   clopidogrel (PLAVIX) 75 MG tablet Take 1 tablet (75 mg total) by mouth daily. 90 tablet 0   Cyanocobalamin (VITAMIN B-12 CR PO) Take by mouth.     DULoxetine (CYMBALTA) 30 MG capsule Take 30 mg by mouth daily.     ezetimibe (ZETIA) 10 MG tablet Take 1 tablet (10 mg total) by mouth daily. 90 tablet 3   isosorbide mononitrate (IMDUR) 60 MG 24 hr tablet Take  1 tablet (60 mg total) by mouth daily. 90 tablet 3   latanoprost (XALATAN) 0.005 % ophthalmic solution Place 1 drop into both eyes at bedtime.      meloxicam (MOBIC) 15 MG tablet Take 15 mg by mouth daily.     metoprolol succinate (TOPROL-XL) 25 MG 24 hr tablet TAKE 1 TABLET BY MOUTH DAILY 90 tablet 2   montelukast (SINGULAIR) 10 MG tablet Take 10 mg by mouth daily.     nitroGLYCERIN (NITROSTAT) 0.4 MG SL tablet Place 1 tablet (0.4 mg total) under the tongue every 5 (five) minutes x 3 doses as needed for chest pain. May repeat x3 25 tablet 3   rosuvastatin (CRESTOR) 10 MG tablet TAKE 1 TABLET(10 MG) BY MOUTH DAILY 90 tablet 3   Wheat Dextrin (BENEFIBER PO) Take 1 Dose by mouth daily.     No facility-administered medications prior to visit.    PAST MEDICAL HISTORY: Past Medical History:  Diagnosis Date   Allergic rhinitis    CAD (coronary artery disease)    a. s/p DES to RCA in 2009 b.  DES to mid-LAD in 2014 c. 08/2021: ISR of LAD and RCA stents treated with cutting balloon angioplasty   Cancer [facility])    Diverticulitis    Diverticulosis    Essential hypertension    GERD (gastroesophageal reflux disease)    Glaucoma  History of breast cancer    Right - s/p XRT 1984, surgery, no chemo   Hyperlipidemia    Personal history of radiation therapy    Rotator cuff syndrome, left    Scoliosis    Urinary incontinence     PAST SURGICAL HISTORY: Past Surgical History:  Procedure Laterality Date   APPENDECTOMY  1981   BREAST BIOPSY  1984   BREAST LUMPECTOMY  1982   right breast   COLONOSCOPY  2008   Dr. Randa Evens: diverticulosis, hemorrhoids, conscious sedation   COLONOSCOPY N/A 01/30/2016   Dr. Jena Gauss: mild divertiuculosis in sigmoid colon. narrowing of the colon in association with the deverticular opening. 6 polyps removed, tubular adenoma. next TCS in 3 years   COLONOSCOPY WITH PROPOFOL N/A 04/17/2021   one 4 mm polyp at splenic flexure, otherwise normal. Tubular adenoma.   CORONARY  ANGIOPLASTY WITH STENT PLACEMENT  02/2008   CORONARY BALLOON ANGIOPLASTY N/A 08/17/2021   Procedure: CORONARY BALLOON ANGIOPLASTY;  Surgeon: Lennette Bihari, MD;  Location: [facility] INVASIVE CV LAB;  Service: Cardiovascular;  Laterality: N/A;   ESOPHAGOGASTRODUODENOSCOPY  2014   Dr. Russella Dar: erosive gastritis, no h.pylori   ESOPHAGOGASTRODUODENOSCOPY N/A 01/30/2016   Dr. Jena Gauss: normal esophagus s/p dilation, small hh.   ESOPHAGOGASTRODUODENOSCOPY (EGD) WITH PROPOFOL N/A 04/17/2021   mild Schatzki ring, s/p dilation. Small hiatal hernia. Normal duodenum.   LEFT HEART CATH AND CORONARY ANGIOGRAPHY N/A 08/17/2021   Procedure: LEFT HEART CATH AND CORONARY ANGIOGRAPHY;  Surgeon: Lennette Bihari, MD;  Location: [facility] INVASIVE CV LAB;  Service: Cardiovascular;  Laterality: N/A;   LEFT HEART CATHETERIZATION WITH CORONARY ANGIOGRAM N/A 12/02/2012   Procedure: LEFT HEART CATHETERIZATION WITH CORONARY ANGIOGRAM;  Surgeon: Corky Crafts, MD;  Location: [facility] CATH LAB;  Service: Cardiovascular;  Laterality: N/A;   MALONEY DILATION N/A 01/30/2016   Procedure: Elease Hashimoto DILATION;  Surgeon: Corbin Ade, MD;  Location: [facility] ENDO SUITE;  Service: Endoscopy;  Laterality: N/A;   MALONEY DILATION N/A 04/17/2021   Procedure: Elease Hashimoto DILATION;  Surgeon: Corbin Ade, MD;  Location: [facility] ENDO SUITE;  Service: Endoscopy;  Laterality: N/A;   NASAL SINUS SURGERY     PARTIAL COLECTOMY N/A 05/19/2018   Procedure: PARTIAL COLECTOMY;  Surgeon: Franky Macho, MD;  Location: [facility] ORS;  Service: General;  Laterality: N/A;   PERCUTANEOUS CORONARY STENT INTERVENTION (PCI-S)  12/02/2012   Procedure: PERCUTANEOUS CORONARY STENT INTERVENTION (PCI-S);  Surgeon: Corky Crafts, MD;  Location: [facility] CATH LAB;  Service: Cardiovascular;;  DES to the MID LAD   POLYPECTOMY  04/17/2021   Procedure: POLYPECTOMY;  Surgeon: Corbin Ade, MD;  Location: [facility] END[facility];  Service: Endoscopy;;    FAMILY HISTORY: Family History  Problem Relation  Age of Onset   Emphysema Father    Heart attack Sister    Emphysema Brother    Heart disease Brother        x3 brothers   Lung cancer Brother    Throat cancer Brother        head/neck cancer   Heart attack Brother    Heart attack Son        blood clot   Colon cancer Neg Hx     SOCIAL HISTORY: Social History   Socioeconomic History   Marital status: Married    Spouse name: Not on file   Number of children: 1   Years of education: Not on file   Highest education level: Not on file  Occupational History   Occupation: Social worker  Occupation: COSMETOLOGY    Employer: [employer]  Tobacco Use   Smoking status: Never    Passive exposure: Past   Smokeless tobacco: Never   Tobacco comments:    Widowed, lives alone but has sig other.   Vaping Use   Vaping status: Never Used  Substance and Sexual Activity   Alcohol use: Not Currently   Drug use: No   Sexual activity: Yes    Birth control/protection: Post-menopausal  Other Topics Concern   Not on file  Social History Narrative   Widowed, husband died.    Lives alone but has significant other, married in 08-Sep-2016 to [place].    Had one son, but he passed away.    Retired as Social worker - [employer] and part-time at [employer]   Son died in 2016/09/08 of AMI. Has two grandsons.       Social Determinants of Health   Financial Resource Strain: Low Risk  (06/13/2023)   Overall Financial Resource Strain (CARDIA)    Difficulty of Paying Living Expenses: Not hard at all  Food Insecurity: No Food Insecurity (06/13/2023)   Hunger Vital Sign    Worried About Running Out of Food in the Last Year: Never true    Ran Out of Food in the Last Year: Never true  Transportation Needs: No Transportation Needs (06/13/2023)   PRAPARE - Administrator, Civil Service (Medical): No    Lack of Transportation (Non-Medical): No  Physical Activity: Inactive (06/13/2023)   Exercise Vital Sign    Days of Exercise per  Week: 0 days    Minutes of Exercise per Session: 0 min  Stress: No Stress Concern Present (06/13/2023)   [employer] of Occupational Health - Occupational Stress Questionnaire    Feeling of Stress : Not at all  Social Connections: Patient Declined (06/13/2023)   Social Connection and Isolation Panel [NHANES]    Frequency of Communication with Friends and Family: Patient declined    Frequency of Social Gatherings with Friends and Family: Patient declined    Attends Religious Services: Patient declined    Database administrator or Organizations: Patient declined    Attends Banker Meetings: Patient declined    Marital Status: Patient declined  Intimate Partner Violence: Not At Risk (06/13/2023)   Humiliation, Afraid, Rape, and Kick questionnaire    Fear of Current or Ex-Partner: No    Emotionally Abused: No    Physically Abused: No    Sexually Abused: No     PHYSICAL EXAM  GENERAL EXAM/CONSTITUTIONAL: Vitals:  Vitals:   08/20/23 1405  BP: (!) 143/70  Pulse: 72  Weight: 131 lb 3.2 oz (59.5 kg)  Height: 5\' 2"  (1.575 m)   Body mass index is 24 kg/m. Wt Readings from Last 3 Encounters:  08/20/23 131 lb 3.2 oz (59.5 kg)  07/16/23 137 lb 9.6 oz (62.4 kg)  06/13/23 137 lb 12.8 oz (62.5 kg)   Patient is in no distress; well developed, nourished and groomed; neck is supple  CARDIOVASCULAR: Examination of carotid arteries is normal; no carotid bruits Regular rate and rhythm, no murmurs Examination of peripheral vascular system by observation and palpation is normal  EYES: Ophthalmoscopic exam of optic discs and posterior segments is normal; no papilledema or hemorrhages No results found.  MUSCULOSKELETAL: Gait, strength, tone, movements noted in Neurologic exam below  NEUROLOGIC: MENTAL STATUS:      No data to display  awake, alert, oriented to person, place and time recent and remote memory intact normal attention and concentration language  fluent, comprehension intact, naming intact fund of knowledge appropriate  CRANIAL NERVE:  2nd - no papilledema on fundoscopic exam 2nd, 3rd, 4th, 6th - pupils equal and reactive to light, visual fields full to confrontation, extraocular muscles intact, no nystagmus 5th - facial sensation symmetric 7th - facial strength symmetric 8th - hearing intact 9th - palate elevates symmetrically, uvula midline 11th - shoulder shrug symmetric 12th - tongue protrusion midline  MOTOR:  normal bulk and tone, full strength in the BUE, BLE  SENSORY:  normal and symmetric to light touch, temperature, vibration  COORDINATION:  finger-nose-finger, fine finger movements normal  REFLEXES:  deep tendon reflexes TRACE and symmetric  GAIT/STATION:  narrow based gait     DIAGNOSTIC DATA (LABS, IMAGING, TESTING) - I reviewed patient records, labs, notes, testing and imaging myself where available.  Lab Results  Component Value Date   WBC 7.0 07/17/2022   HGB 14.1 07/17/2022   HCT 44.5 07/17/2022   MCV 93.1 07/17/2022   PLT 283 07/17/2022      Component Value Date/Time   NA 140 08/01/2023 1207   K 4.2 08/01/2023 1207   CL 100 08/01/2023 1207   CO2 30 08/01/2023 1207   GLUCOSE 106 (H) 08/01/2023 1207   BUN 19 08/01/2023 1207   CREATININE 1.03 (H) 08/01/2023 1207   CREATININE 1.00 (H) 08/10/2021 1209   CALCIUM 9.2 08/01/2023 1207   PROT 6.6 08/10/2021 1209   ALBUMIN 3.8 05/06/2021 0950   AST 20 08/10/2021 1209   ALT 16 08/10/2021 1209   ALKPHOS 60 05/06/2021 0950   BILITOT 0.8 08/10/2021 1209   GFRNONAA 54 (L) 08/01/2023 1207   GFRNONAA 67 10/22/2017 1213   GFRAA >60 05/30/2020 1232   GFRAA 78 10/22/2017 1213   Lab Results  Component Value Date   CHOL 177 08/01/2023   HDL 42 08/01/2023   LDLCALC 106 (H) 08/01/2023   LDLDIRECT 144.4 11/09/2013   TRIG 146 08/01/2023   CHOLHDL 4.2 08/01/2023   Lab Results  Component Value Date   HGBA1C 6.1 09/29/2013   Lab Results   Component Value Date   VITAMINB12 888 12/09/2017   Lab Results  Component Value Date   TSH 1.583 05/30/2020    07/17/22 MRI brain  1. No acute intracranial abnormality. 2. Old left cerebellar infarct and old bilateral corona radiata small vessel infarcts. 3. Chronic microvascular disease.    ASSESSMENT AND PLAN  82 y.o. year old female here with:  Dx:  1. Dizziness   2. Weakness   3. Ataxia     PLAN:  BRAIN FOG / MALAISE - check MRI brain  B12 deficiency - repeat B12 level (was 142 in April 2024; did not start replacement yet) - start B12 daily  MILD DYSTHYMIA - follow up with PCP; consider psychology  Orders Placed This Encounter  Procedures   MR BRAIN W WO CONTRAST   Vitamin B12   Return for pending if symptoms worsen or fail to improve, pending test results.    Suanne Marker, MD 08/20/2023, 2:49 PM Certified in Neurology, Neurophysiology and Neuroimaging  [facility] [address] [place], [place] [address] [phone]

## 2023-08-21 LAB — VITAMIN B12: Vitamin B-12: >2000 pg/mL — ABNORMAL HIGH (ref 232–1245)

## 2023-08-24 ENCOUNTER — Inpatient Hospital Stay (HOSPITAL_COMMUNITY)
Admission: EM | Admit: 2023-08-24 | Discharge: 2023-08-27 | DRG: 287 | Disposition: A | Discharge status: Home / Self Care | Payer: Medicare Other | Attending: Internal Medicine | Admitting: Internal Medicine

## 2023-08-24 ENCOUNTER — Emergency Department (HOSPITAL_COMMUNITY): Payer: Medicare Other

## 2023-08-24 ENCOUNTER — Encounter (HOSPITAL_COMMUNITY): Payer: Self-pay

## 2023-08-24 ENCOUNTER — Other Ambulatory Visit: Payer: Self-pay

## 2023-08-24 DIAGNOSIS — G8929 Other chronic pain: Secondary | ICD-10-CM | POA: Diagnosis present

## 2023-08-24 DIAGNOSIS — Z79899 Other long term (current) drug therapy: Secondary | ICD-10-CM

## 2023-08-24 DIAGNOSIS — Z7982 Long term (current) use of aspirin: Secondary | ICD-10-CM

## 2023-08-24 DIAGNOSIS — Z8249 Family history of ischemic heart disease and other diseases of the circulatory system: Secondary | ICD-10-CM

## 2023-08-24 DIAGNOSIS — E782 Mixed hyperlipidemia: Secondary | ICD-10-CM | POA: Diagnosis not present

## 2023-08-24 DIAGNOSIS — K219 Gastro-esophageal reflux disease without esophagitis: Secondary | ICD-10-CM | POA: Diagnosis present

## 2023-08-24 DIAGNOSIS — Z801 Family history of malignant neoplasm of trachea, bronchus and lung: Secondary | ICD-10-CM

## 2023-08-24 DIAGNOSIS — I129 Hypertensive chronic kidney disease with stage 1 through stage 4 chronic kidney disease, or unspecified chronic kidney disease: Secondary | ICD-10-CM | POA: Diagnosis present

## 2023-08-24 DIAGNOSIS — J302 Other seasonal allergic rhinitis: Secondary | ICD-10-CM | POA: Diagnosis not present

## 2023-08-24 DIAGNOSIS — J309 Allergic rhinitis, unspecified: Secondary | ICD-10-CM | POA: Diagnosis present

## 2023-08-24 DIAGNOSIS — Z923 Personal history of irradiation: Secondary | ICD-10-CM

## 2023-08-24 DIAGNOSIS — N1831 Chronic kidney disease, stage 3a: Secondary | ICD-10-CM | POA: Diagnosis present

## 2023-08-24 DIAGNOSIS — I10 Essential (primary) hypertension: Secondary | ICD-10-CM

## 2023-08-24 DIAGNOSIS — I2510 Atherosclerotic heart disease of native coronary artery without angina pectoris: Secondary | ICD-10-CM

## 2023-08-24 DIAGNOSIS — Z888 Allergy status to other drugs, medicaments and biological substances status: Secondary | ICD-10-CM

## 2023-08-24 DIAGNOSIS — H409 Unspecified glaucoma: Secondary | ICD-10-CM | POA: Insufficient documentation

## 2023-08-24 DIAGNOSIS — I25110 Atherosclerotic heart disease of native coronary artery with unstable angina pectoris: Principal | ICD-10-CM | POA: Diagnosis present

## 2023-08-24 DIAGNOSIS — E876 Hypokalemia: Secondary | ICD-10-CM | POA: Diagnosis present

## 2023-08-24 DIAGNOSIS — Z7902 Long term (current) use of antithrombotics/antiplatelets: Secondary | ICD-10-CM

## 2023-08-24 DIAGNOSIS — Z853 Personal history of malignant neoplasm of breast: Secondary | ICD-10-CM

## 2023-08-24 DIAGNOSIS — I452 Bifascicular block: Secondary | ICD-10-CM | POA: Diagnosis present

## 2023-08-24 DIAGNOSIS — T82855A Stenosis of coronary artery stent, initial encounter: Secondary | ICD-10-CM | POA: Diagnosis present

## 2023-08-24 DIAGNOSIS — Z825 Family history of asthma and other chronic lower respiratory diseases: Secondary | ICD-10-CM

## 2023-08-24 DIAGNOSIS — Y831 Surgical operation with implant of artificial internal device as the cause of abnormal reaction of the patient, or of later complication, without mention of misadventure at the time of the procedure: Secondary | ICD-10-CM | POA: Diagnosis present

## 2023-08-24 DIAGNOSIS — R079 Chest pain, unspecified: Secondary | ICD-10-CM | POA: Diagnosis not present

## 2023-08-24 DIAGNOSIS — Z9861 Coronary angioplasty status: Secondary | ICD-10-CM

## 2023-08-24 DIAGNOSIS — Z808 Family history of malignant neoplasm of other organs or systems: Secondary | ICD-10-CM

## 2023-08-24 DIAGNOSIS — I252 Old myocardial infarction: Secondary | ICD-10-CM

## 2023-08-24 LAB — TROPONIN I (HIGH SENSITIVITY)
Troponin I (High Sensitivity): 12 ng/L (ref <18)
Troponin I (High Sensitivity): 12 ng/L (ref <18)

## 2023-08-24 LAB — BASIC METABOLIC PANEL
Anion gap: 9 (ref 5–15)
BUN: 22 mg/dL (ref 8–23)
CO2: 29 mmol/L (ref 22–32)
Calcium: 8.8 mg/dL — ABNORMAL LOW (ref 8.9–10.3)
Chloride: 102 mmol/L (ref 98–111)
Creatinine, Ser: 1.14 mg/dL — ABNORMAL HIGH (ref 0.44–1.00)
GFR, Estimated: 48 mL/min — ABNORMAL LOW (ref >60)
Glucose, Bld: 107 mg/dL — ABNORMAL HIGH (ref 70–99)
Potassium: 3.5 mmol/L (ref 3.5–5.1)
Sodium: 140 mmol/L (ref 135–145)

## 2023-08-24 LAB — CBC
HCT: 43 % (ref 36.0–46.0)
Hemoglobin: 14.2 g/dL (ref 12.0–15.0)
MCH: 30.9 pg (ref 26.0–34.0)
MCHC: 33 g/dL (ref 30.0–36.0)
MCV: 93.5 fL (ref 80.0–100.0)
Platelets: 231 10*3/uL (ref 150–400)
RBC: 4.6 MIL/uL (ref 3.87–5.11)
RDW: 13 % (ref 11.5–15.5)
WBC: 7.3 10*3/uL (ref 4.0–10.5)
nRBC: 0 % (ref 0.0–0.2)

## 2023-08-24 MED ORDER — ASPIRIN 81 MG PO CHEW
324.0000 mg | CHEWABLE_TABLET | Freq: Once | ORAL | Status: AC
  Administered 2023-08-24: 324 mg via ORAL
  Filled 2023-08-24: qty 4

## 2023-08-24 MED ORDER — ONDANSETRON HCL 4 MG PO TABS: 4.0000 mg | ORAL_TABLET | Freq: Four times a day (QID) | ORAL | Status: DC | PRN

## 2023-08-24 MED ORDER — ENOXAPARIN SODIUM 30 MG/0.3ML IJ SOSY
30.0000 mg | PREFILLED_SYRINGE | INTRAMUSCULAR | Status: DC
  Administered 2023-08-24 – 2023-08-26 (×3): 30 mg via SUBCUTANEOUS
  Filled 2023-08-24 (×3): qty 0.3

## 2023-08-24 MED ORDER — ONDANSETRON HCL 4 MG/2ML IJ SOLN: 4.0000 mg | Freq: Four times a day (QID) | INTRAMUSCULAR | Status: DC | PRN

## 2023-08-24 MED ORDER — ACETAMINOPHEN 650 MG RE SUPP: 650.0000 mg | Freq: Four times a day (QID) | RECTAL | Status: DC | PRN

## 2023-08-24 MED ORDER — ACETAMINOPHEN 325 MG PO TABS
650.0000 mg | ORAL_TABLET | Freq: Four times a day (QID) | ORAL | Status: DC | PRN
Start: 2023-08-24 — End: 2023-08-27
  Administered 2023-08-26 – 2023-08-27 (×4): 650 mg via ORAL
  Filled 2023-08-24 (×4): qty 2

## 2023-08-24 NOTE — ED Triage Notes (Signed)
Pt with hx of MI and stents placed a few years ago.  Pt reports she had chest pain radiating into both sides of her jaw.  Pt reports she took 2 sublingual nitroglycerin and chest pain has subsided but she still feels pressure in her jaws.

## 2023-08-24 NOTE — ED Provider Notes (Signed)
[place] EMERGENCY DEPARTMENT AT [facility] Provider Note   CSN: [id number] Arrival date & time: 08/24/23  1500     History  Chief Complaint  Patient presents with   Chest Pain    Dorothy Lee is a 82 y.o. female.  She has PMH of CAD has had stents x 4 in the past, was it was slightly had Cutting Balloon angioplasty to in-stent restenosis of LAD and RCA November 2022.  She is on Plavix and aspirin, follows up with Dr. Diona Browner for cardiology.  Presents ER for chest pain that started about an hour prior to arrival for severe chest pain that radiated to jaws bilaterally that occurred while grocery shopping.  She took 2 nitroglycerin with subsequent complete resolution of her pain.  She did not have sweating dizziness or syncope but states she felt flushed while this was happening.  She is not having shortness of breath, no other symptoms at this time.  She was concerned because felt exactly like prior heart attack.   Chest Pain      Home Medications Prior to Admission medications   Medication Sig Start Date End Date Taking? Authorizing Provider  aspirin EC 81 MG tablet Take 81 mg by mouth daily.    Yes [provider]  chlorthalidone (HYGROTON) 25 MG tablet Take 0.5 tablets (12.5 mg total) by mouth daily. 07/16/23 10/14/23 Yes Jonelle Sidle, MD  clopidogrel (PLAVIX) 75 MG tablet Take 1 tablet (75 mg total) by mouth daily. 05/27/23  Yes Jonelle Sidle, MD  Cyanocobalamin (VITAMIN B-12 CR PO) Take by mouth.   Yes [provider]  DULoxetine (CYMBALTA) 30 MG capsule Take 30 mg by mouth daily. 03/23/22  Yes [provider]  ezetimibe (ZETIA) 10 MG tablet Take 1 tablet (10 mg total) by mouth daily. 08/01/23 10/30/23 Yes Jonelle Sidle, MD  isosorbide mononitrate (IMDUR) 60 MG 24 hr tablet Take 1 tablet (60 mg total) by mouth daily. 04/04/23  Yes Strader, [place] M, PA-C  latanoprost (XALATAN) 0.005 % ophthalmic solution Place 1 drop into both  eyes at bedtime.  07/28/14  Yes [provider]  metoprolol succinate (TOPROL-XL) 25 MG 24 hr tablet TAKE 1 TABLET BY MOUTH DAILY 02/06/23  Yes Jonelle Sidle, MD  montelukast (SINGULAIR) 10 MG tablet Take 10 mg by mouth daily.   Yes [provider]  nitroGLYCERIN (NITROSTAT) 0.4 MG SL tablet Place 1 tablet (0.4 mg total) under the tongue every 5 (five) minutes x 3 doses as needed for chest pain. May repeat x3 04/04/23  Yes Strader, [place] M, PA-C  Wheat Dextrin (BENEFIBER PO) Take 1 Dose by mouth daily.   Yes [provider]      Allergies    Crestor [rosuvastatin], Lipitor [atorvastatin], Lisinopril, and Simvastatin    Review of Systems   Review of Systems  Cardiovascular:  Positive for chest pain.    Physical Exam Updated Vital Signs BP (!) 122/99   Pulse 63   Temp 97.9 F (36.6 C) (Oral)   Resp 20   Ht 5\' 2"  (1.575 m)   Wt 58.1 kg   SpO2 97%   BMI 23.41 kg/m  Physical Exam  ED Results / Procedures / Treatments   Labs (all labs ordered are listed, but only abnormal results are displayed) Labs Reviewed  BASIC METABOLIC PANEL - Abnormal; Notable for the following components:      Result Value   Glucose, Bld 107 (*)    Creatinine, Ser  1.14 (*)    Calcium 8.8 (*)    GFR, Estimated 48 (*)    All other components within normal limits  CBC  CBC  CREATININE, SERUM  COMPREHENSIVE METABOLIC PANEL  CBC  MAGNESIUM  PHOSPHORUS  TROPONIN I (HIGH SENSITIVITY)  TROPONIN I (HIGH SENSITIVITY)    EKG EKG Interpretation Date/Time:  Saturday August 24 2023 15:06:41 EST Ventricular Rate:  68 PR Interval:    QRS Duration:  124 QT Interval:  388 QTC Calculation: 412 R Axis:   -47  Text Interpretation: Undetermined rhythm Right bundle branch block Left anterior fascicular block Bifascicular block Minimal voltage criteria for LVH, may be normal variant ( R in aVL ) Abnormal ECG When compared with ECG of 16-Jul-2023 11:09, Significant changes have  occurred Confirmed by Eber Hong ([id number]) on 08/24/2023 3:13:12 PM  Radiology DG Chest Port 1 View  Result Date: 08/24/2023 CLINICAL DATA:  Chest pain. EXAM: PORTABLE CHEST 1 VIEW COMPARISON:  May 06, 2021 FINDINGS: Calcific atherosclerotic disease of the aorta. Cardiomediastinal silhouette is normal. Mediastinal contours appear intact. There is no evidence of focal airspace consolidation, pleural effusion or pneumothorax. Osseous structures are without acute abnormality. Soft tissues are grossly normal. Post surgical changes in the right thorax. IMPRESSION: No active disease. Electronically Signed   By: Ted Mcalpine M.D.   On: 08/24/2023 16:36    Procedures Procedures    Medications Ordered in ED Medications  enoxaparin (LOVENOX) injection 30 mg (has no administration in time range)  acetaminophen (TYLENOL) tablet 650 mg (has no administration in time range)    Or  acetaminophen (TYLENOL) suppository 650 mg (has no administration in time range)  ondansetron (ZOFRAN) tablet 4 mg (has no administration in time range)    Or  ondansetron (ZOFRAN) injection 4 mg (has no administration in time range)  aspirin chewable tablet 324 mg (324 mg Oral Given 08/24/23 1554)    ED Course/ Medical Decision Making/ A&P                                 Medical Decision Making This patient presents to the ED for concern of chest pain that feels exactly a prior heart attack started 1 hour prior to arrival and resolved with nitroglycerin, this involves an extensive number of treatment options, and is a complaint that carries with it a high risk of complications and morbidity.  The differential diagnosis includes NSTEMI, STEMI, unstable angina, PE, pneumonia, costochondritis, other   Co morbidities that complicate the patient evaluation :   CAD, GERD   Additional history obtained:  Additional history obtained from EMR External records from outside source obtained and reviewed including prior  cardio notes, prior labs   Lab Tests:  I Ordered, and personally interpreted labs.  The pertinent results include:  troponin normal with no delta elevation, renal function is at baseline, CBC is normal   Imaging Studies ordered:  I ordered imaging studies including chest x-ray which shows no pulmonary edema or infiltrate I independently visualized and interpreted imaging within scope of identifying emergent findings  I agree with the radiologist interpretation   Cardiac Monitoring: / EKG:  The patient was maintained on a cardiac monitor.  I personally viewed and interpreted the cardiac monitored which showed an underlying rhythm of: Sinus rhythm with LVH   Consultations Obtained:  I requested consultation with the on-call cardiologist Dr. Caren Griffins,  and discussed lab and imaging findings as well as  pertinent plan - they recommend: Admission to [facility] to  Hospitalist service   Problem List / ED Course / Critical interventions / Medication management  Chest pain-patient had episode of chest pain described as severe pressure radiating to bilateral jaw's that resolved with 2 nitroglycerin.  She has been chest pain-free in the ED had very concerning story as this felt exactly prior heart attack.  Heart score is 7, she is high risk, discussed with cardiology as above who advised admission to [facility] due to lack of cardiology coverage over the weekend here at [facility].  Patient, although disappointed, is agreeable with admission I ordered medication including Aspirin I have reviewed the patients home medicines and have made adjustments as needed   Social Determinants of Health:  Pt lives at home with her husband      Amount and/or Complexity of Data Reviewed Labs: ordered. Radiology: ordered.  Risk OTC drugs. Decision regarding hospitalization.           Final Clinical Impression(s) / ED Diagnoses Final diagnoses:  Chest pain, unspecified type    Rx /  DC Orders ED Discharge Orders     None         Josem Kaufmann 08/24/23 2019    Eber Hong, MD 08/25/23 1454

## 2023-08-24 NOTE — H&P (Addendum)
History and Physical    Patient: Dorothy Lee QIO:[id number] DOB: [birth year] DOA: 08/24/2023 DOS: the patient was seen and examined on 08/24/2023 PCP: Aliene Beams, MD  Patient coming from: Home  Chief Complaint:  Chief Complaint  Patient presents with   Chest Pain   HPI: Dorothy Lee is a 82 y.o. female with medical history significant of hypertension, hyperlipidemia, CAD status post stent placement who presents to the emergency department due to chest pain which started 1 hour PTA.  Chest pain was midsternal, pressure-like, severe in intensity and radiated to bilateral jaws while grocery shopping.  She took 2 sublingual nitroglycerin tablets with subsequent resolution of chest pain.  She endorsed flushing chronic chest pain, but denies dizziness, sweating, shortness of breath.  Patient states symptoms were similar to prior heart attack.  ED Course:  In the emergency department, BP was 150/38, other vital signs were within normal range.  Workup in the ED showed normal BMP except for blood glucose of 107 and creatinine of 1.14.  CBC was normal, troponin x 2 was flat at 12 Chest x-ray showed no active disease Aspirin 324 mg x 1 was given Cardiologist on-call at [facility] was consulted and recommended admitting the patient to [facility] with plan to consult on patient on arrival to [facility] per EDP. Hospitalist was asked to admit patient for further evaluation and management.  Review of Systems: Review of systems as noted in the HPI. All other systems reviewed and are negative.   Past Medical History:  Diagnosis Date   Allergic rhinitis    CAD (coronary artery disease)    a. s/p DES to RCA in 2009 b.  DES to mid-LAD in 2014 c. 08/2021: ISR of LAD and RCA stents treated with cutting balloon angioplasty   Cancer [facility])    Diverticulitis    Diverticulosis    Essential hypertension    GERD (gastroesophageal reflux disease)    Glaucoma    History of breast cancer    Right - s/p XRT 1984, surgery, no  chemo   Hyperlipidemia    Personal history of radiation therapy    Rotator cuff syndrome, left    Scoliosis    Urinary incontinence    Past Surgical History:  Procedure Laterality Date   APPENDECTOMY  1981   BREAST BIOPSY  1984   BREAST LUMPECTOMY  1982   right breast   COLONOSCOPY  2008   Dr. Randa Evens: diverticulosis, hemorrhoids, conscious sedation   COLONOSCOPY N/A 01/30/2016   Dr. Jena Gauss: mild divertiuculosis in sigmoid colon. narrowing of the colon in association with the deverticular opening. 6 polyps removed, tubular adenoma. next TCS in 3 years   COLONOSCOPY WITH PROPOFOL N/A 04/17/2021   one 4 mm polyp at splenic flexure, otherwise normal. Tubular adenoma.   CORONARY ANGIOPLASTY WITH STENT PLACEMENT  02/2008   CORONARY BALLOON ANGIOPLASTY N/A 08/17/2021   Procedure: CORONARY BALLOON ANGIOPLASTY;  Surgeon: Lennette Bihari, MD;  Location: [facility] INVASIVE CV LAB;  Service: Cardiovascular;  Laterality: N/A;   ESOPHAGOGASTRODUODENOSCOPY  2014   Dr. Russella Dar: erosive gastritis, no h.pylori   ESOPHAGOGASTRODUODENOSCOPY N/A 01/30/2016   Dr. Jena Gauss: normal esophagus s/p dilation, small hh.   ESOPHAGOGASTRODUODENOSCOPY (EGD) WITH PROPOFOL N/A 04/17/2021   mild Schatzki ring, s/p dilation. Small hiatal hernia. Normal duodenum.   LEFT HEART CATH AND CORONARY ANGIOGRAPHY N/A 08/17/2021   Procedure: LEFT HEART CATH AND CORONARY ANGIOGRAPHY;  Surgeon: Lennette Bihari, MD;  Location: [facility] INVASIVE CV LAB;  Service: Cardiovascular;  Laterality: N/A;  LEFT HEART CATHETERIZATION WITH CORONARY ANGIOGRAM N/A 12/02/2012   Procedure: LEFT HEART CATHETERIZATION WITH CORONARY ANGIOGRAM;  Surgeon: Corky Crafts, MD;  Location: [facility] CATH LAB;  Service: Cardiovascular;  Laterality: N/A;   MALONEY DILATION N/A 01/30/2016   Procedure: Elease Hashimoto DILATION;  Surgeon: Corbin Ade, MD;  Location: [facility] ENDO SUITE;  Service: Endoscopy;  Laterality: N/A;   MALONEY DILATION N/A 04/17/2021   Procedure: Elease Hashimoto DILATION;   Surgeon: Corbin Ade, MD;  Location: [facility] ENDO SUITE;  Service: Endoscopy;  Laterality: N/A;   NASAL SINUS SURGERY     PARTIAL COLECTOMY N/A 05/19/2018   Procedure: PARTIAL COLECTOMY;  Surgeon: Franky Macho, MD;  Location: [facility] ORS;  Service: General;  Laterality: N/A;   PERCUTANEOUS CORONARY STENT INTERVENTION (PCI-S)  12/02/2012   Procedure: PERCUTANEOUS CORONARY STENT INTERVENTION (PCI-S);  Surgeon: Corky Crafts, MD;  Location[facility] CATH LAB;  Service: Cardiovascular;;  DES to the MID LAD   POLYPECTOMY  04/17/2021   Procedure: POLYPECTOMY;  Surgeon: Corbin Ade, MD;  Location: [facility] ENDO SUITE;  Service: Endoscopy;;    Social History:  reports that she has never smoked. She has been exposed to tobacco smoke. She has never used smokeless tobacco. She reports that she does not currently use alcohol. She reports that she does not use drugs.   Allergies  Allergen Reactions   Crestor [Rosuvastatin]     Crestor 20 mg caused muscle aches - pt is able to tolerate lower doses (takes 10mg )   Lipitor [Atorvastatin]     Atorvastatin 40 mg caused fatigue   Lisinopril Cough   Simvastatin     Muscle aches    Family History  Problem Relation Age of Onset   Emphysema Father    Heart attack Sister    Emphysema Brother    Heart disease Brother        x3 brothers   Lung cancer Brother    Throat cancer Brother        head/neck cancer   Heart attack Brother    Heart attack Son        blood clot   Colon cancer Neg Hx      Prior to Admission medications   Medication Sig Start Date End Date Taking? Authorizing Provider  aspirin EC 81 MG tablet Take 81 mg by mouth daily.    Yes [provider]  chlorthalidone (HYGROTON) 25 MG tablet Take 0.5 tablets (12.5 mg total) by mouth daily. 07/16/23 10/14/23 Yes Jonelle Sidle, MD  clopidogrel (PLAVIX) 75 MG tablet Take 1 tablet (75 mg total) by mouth daily. 05/27/23  Yes Jonelle Sidle, MD  Cyanocobalamin (VITAMIN B-12 CR PO) Take  by mouth.   Yes [provider]  DULoxetine (CYMBALTA) 30 MG capsule Take 30 mg by mouth daily. 03/23/22  Yes [provider]  ezetimibe (ZETIA) 10 MG tablet Take 1 tablet (10 mg total) by mouth daily. 08/01/23 10/30/23 Yes Jonelle Sidle, MD  isosorbide mononitrate (IMDUR) 60 MG 24 hr tablet Take 1 tablet (60 mg total) by mouth daily. 04/04/23  Yes Strader, [place] M, PA-C  latanoprost (XALATAN) 0.005 % ophthalmic solution Place 1 drop into both eyes at bedtime.  07/28/14  Yes [provider]  metoprolol succinate (TOPROL-XL) 25 MG 24 hr tablet TAKE 1 TABLET BY MOUTH DAILY 02/06/23  Yes Jonelle Sidle, MD  montelukast (SINGULAIR) 10 MG tablet Take 10 mg by mouth daily.   Yes [provider]  nitroGLYCERIN (NITROSTAT) 0.4 MG  SL tablet Place 1 tablet (0.4 mg total) under the tongue every 5 (five) minutes x 3 doses as needed for chest pain. May repeat x3 04/04/23  Yes Strader, [place] M, PA-C  Wheat Dextrin (BENEFIBER PO) Take 1 Dose by mouth daily.   Yes [provider]    Physical Exam: BP (!) 122/99   Pulse 63   Temp 97.9 F (36.6 C) (Oral)   Resp 20   Ht 5\' 2"  (1.575 m)   Wt 58.1 kg   SpO2 97%   BMI 23.41 kg/m   General: 82 y.o. year-old female well developed well nourished in no acute distress.  Alert and oriented x3. HEENT: NCAT, EOMI Neck: Supple, trachea medial Cardiovascular: Regular rate and rhythm with no rubs or gallops.  No thyromegaly or JVD noted.  No lower extremity edema. 2/4 pulses in all 4 extremities. Respiratory: Clear to auscultation with no wheezes or rales. Good inspiratory effort. Abdomen: Soft, nontender nondistended with normal bowel sounds x4 quadrants. Muskuloskeletal: No cyanosis, clubbing or edema noted bilaterally Neuro: CN II-XII intact, strength 5/5 x 4, sensation, reflexes intact Skin: No ulcerative lesions noted or rashes Psychiatry: Judgement and insight appear normal. Mood is appropriate for condition  and setting          Labs on Admission:  Basic Metabolic Panel: Recent Labs  Lab 08/24/23 1601  NA 140  K 3.5  CL 102  CO2 29  GLUCOSE 107*  BUN 22  CREATININE 1.14*  CALCIUM 8.8*   Liver Function Tests: No results for input(s): "AST", "ALT", "ALKPHOS", "BILITOT", "PROT", "ALBUMIN" in the last 168 hours. No results for input(s): "LIPASE", "AMYLASE" in the last 168 hours. No results for input(s): "AMMONIA" in the last 168 hours. CBC: Recent Labs  Lab 08/24/23 1601  WBC 7.3  HGB 14.2  HCT 43.0  MCV 93.5  PLT 231   Cardiac Enzymes: No results for input(s): "CKTOTAL", "CKMB", "CKMBINDEX", "TROPONINI" in the last 168 hours.  BNP (last 3 results) No results for input(s): "BNP" in the last 8760 hours.  ProBNP (last 3 results) No results for input(s): "PROBNP" in the last 8760 hours.  CBG: No results for input(s): "GLUCAP" in the last 168 hours.  Radiological Exams on Admission: DG Chest Port 1 View  Result Date: 08/24/2023 CLINICAL DATA:  Chest pain. EXAM: PORTABLE CHEST 1 VIEW COMPARISON:  May 06, 2021 FINDINGS: Calcific atherosclerotic disease of the aorta. Cardiomediastinal silhouette is normal. Mediastinal contours appear intact. There is no evidence of focal airspace consolidation, pleural effusion or pneumothorax. Osseous structures are without acute abnormality. Soft tissues are grossly normal. Post surgical changes in the right thorax. IMPRESSION: No active disease. Electronically Signed   By: Ted Mcalpine M.D.   On: 08/24/2023 16:36    EKG: I independently viewed the EKG done and my findings are as followed: Normal sinus rhythm at a rate of 60 bpm with RBBB and LAFB and ST depression with T wave inversion in leads V4 to V6  Assessment/Plan Present on Admission:  Chest pain  Essential hypertension  Allergic rhinitis  Principal Problem:   Chest pain Active Problems:   Essential hypertension   Allergic rhinitis   CAD S/P percutaneous coronary  angioplasty   Mixed hyperlipidemia   Glaucoma  Chest pain r/o ACS Cardiovascular risk factors include hypertension, hyperlipidemia, CAD status post stent placement This may be due to unstable angina pectoris Continue telemetry  Troponins x2 -was flat at 12 EKG showed normal sinus rhythm at a rate of  60 bpm with RBBB and LAFB and ST depression with T wave inversion in leads V4 to V6 Patient took 2 nitroglycerin sublingual tablets with subsequent resolution of the chest pain Cardiology on-call at [facility] was consulted and recommended admitting patient to [facility] asked to consult on patient on arrival to [facility] per [facility] EDP Heparin drip was not indicated at this time by cardiology per [facility] EDP Aspirin 324 mg p.o. x 1 was given. Continue nitroglycerin as needed  Essential hypertension Continue Toprol-XL  Mixed hyperlipidemia Continue Zetia  CAD s/p stent placement Continue aspirin, Plavix, Toprol-XL, nitroglycerin, Imdur  Glaucoma Continue latanoprost  Allergic rhinitis Continue Singulair   DVT prophylaxis: Lovenox  Code Status: Full code  Family Communication: None at bedside  Consults: Cardiology (by [facility] EDP)  Severity of Illness: The appropriate patient status for this patient is OBSERVATION. Observation status is judged to be reasonable and necessary in order to provide the required intensity of service to ensure the patient's safety. The patient's presenting symptoms, physical exam findings, and initial radiographic and laboratory data in the context of their medical condition is felt to place them at decreased risk for further clinical deterioration. Furthermore, it is anticipated that the patient will be medically stable for discharge from the hospital within 2 midnights of admission.   Author: Frankey Shown, DO 08/24/2023 8:27 PM  For on call review [email or web address].

## 2023-08-25 ENCOUNTER — Telehealth: Payer: Self-pay | Admitting: Physician Assistant

## 2023-08-25 DIAGNOSIS — Y831 Surgical operation with implant of artificial internal device as the cause of abnormal reaction of the patient, or of later complication, without mention of misadventure at the time of the procedure: Secondary | ICD-10-CM | POA: Diagnosis present

## 2023-08-25 DIAGNOSIS — E876 Hypokalemia: Secondary | ICD-10-CM | POA: Diagnosis present

## 2023-08-25 DIAGNOSIS — I200 Unstable angina: Secondary | ICD-10-CM | POA: Diagnosis not present

## 2023-08-25 DIAGNOSIS — H409 Unspecified glaucoma: Secondary | ICD-10-CM | POA: Diagnosis present

## 2023-08-25 DIAGNOSIS — R079 Chest pain, unspecified: Secondary | ICD-10-CM | POA: Diagnosis present

## 2023-08-25 DIAGNOSIS — G8929 Other chronic pain: Secondary | ICD-10-CM | POA: Diagnosis present

## 2023-08-25 DIAGNOSIS — Z79899 Other long term (current) drug therapy: Secondary | ICD-10-CM | POA: Diagnosis not present

## 2023-08-25 DIAGNOSIS — Z7902 Long term (current) use of antithrombotics/antiplatelets: Secondary | ICD-10-CM | POA: Diagnosis not present

## 2023-08-25 DIAGNOSIS — Z888 Allergy status to other drugs, medicaments and biological substances status: Secondary | ICD-10-CM | POA: Diagnosis not present

## 2023-08-25 DIAGNOSIS — K219 Gastro-esophageal reflux disease without esophagitis: Secondary | ICD-10-CM | POA: Diagnosis present

## 2023-08-25 DIAGNOSIS — E782 Mixed hyperlipidemia: Secondary | ICD-10-CM | POA: Diagnosis present

## 2023-08-25 DIAGNOSIS — Z8249 Family history of ischemic heart disease and other diseases of the circulatory system: Secondary | ICD-10-CM | POA: Diagnosis not present

## 2023-08-25 DIAGNOSIS — T82855A Stenosis of coronary artery stent, initial encounter: Secondary | ICD-10-CM | POA: Diagnosis present

## 2023-08-25 DIAGNOSIS — I25110 Atherosclerotic heart disease of native coronary artery with unstable angina pectoris: Secondary | ICD-10-CM | POA: Diagnosis present

## 2023-08-25 DIAGNOSIS — I129 Hypertensive chronic kidney disease with stage 1 through stage 4 chronic kidney disease, or unspecified chronic kidney disease: Secondary | ICD-10-CM | POA: Diagnosis present

## 2023-08-25 DIAGNOSIS — Z808 Family history of malignant neoplasm of other organs or systems: Secondary | ICD-10-CM | POA: Diagnosis not present

## 2023-08-25 DIAGNOSIS — Z923 Personal history of irradiation: Secondary | ICD-10-CM | POA: Diagnosis not present

## 2023-08-25 DIAGNOSIS — Z853 Personal history of malignant neoplasm of breast: Secondary | ICD-10-CM | POA: Diagnosis not present

## 2023-08-25 DIAGNOSIS — J309 Allergic rhinitis, unspecified: Secondary | ICD-10-CM | POA: Diagnosis present

## 2023-08-25 DIAGNOSIS — I252 Old myocardial infarction: Secondary | ICD-10-CM | POA: Diagnosis not present

## 2023-08-25 DIAGNOSIS — N1831 Chronic kidney disease, stage 3a: Secondary | ICD-10-CM | POA: Diagnosis present

## 2023-08-25 DIAGNOSIS — Z7982 Long term (current) use of aspirin: Secondary | ICD-10-CM | POA: Diagnosis not present

## 2023-08-25 DIAGNOSIS — Z825 Family history of asthma and other chronic lower respiratory diseases: Secondary | ICD-10-CM | POA: Diagnosis not present

## 2023-08-25 DIAGNOSIS — I10 Essential (primary) hypertension: Secondary | ICD-10-CM | POA: Diagnosis not present

## 2023-08-25 DIAGNOSIS — I452 Bifascicular block: Secondary | ICD-10-CM | POA: Diagnosis present

## 2023-08-25 DIAGNOSIS — Z801 Family history of malignant neoplasm of trachea, bronchus and lung: Secondary | ICD-10-CM | POA: Diagnosis not present

## 2023-08-25 LAB — COMPREHENSIVE METABOLIC PANEL
ALT: 18 U/L (ref 0–44)
AST: 20 U/L (ref 15–41)
Albumin: 3.3 g/dL — ABNORMAL LOW (ref 3.5–5.0)
Alkaline Phosphatase: 46 U/L (ref 38–126)
Anion gap: 11 (ref 5–15)
BUN: 18 mg/dL (ref 8–23)
CO2: 27 mmol/L (ref 22–32)
Calcium: 8.5 mg/dL — ABNORMAL LOW (ref 8.9–10.3)
Chloride: 100 mmol/L (ref 98–111)
Creatinine, Ser: 1.04 mg/dL — ABNORMAL HIGH (ref 0.44–1.00)
GFR, Estimated: 53 mL/min — ABNORMAL LOW (ref >60)
Glucose, Bld: 90 mg/dL (ref 70–99)
Potassium: 3.4 mmol/L — ABNORMAL LOW (ref 3.5–5.1)
Sodium: 138 mmol/L (ref 135–145)
Total Bilirubin: 1.2 mg/dL — ABNORMAL HIGH (ref <1.2)
Total Protein: 6 g/dL — ABNORMAL LOW (ref 6.5–8.1)

## 2023-08-25 LAB — CBC
HCT: 40.9 % (ref 36.0–46.0)
Hemoglobin: 12.9 g/dL (ref 12.0–15.0)
MCH: 29.2 pg (ref 26.0–34.0)
MCHC: 31.5 g/dL (ref 30.0–36.0)
MCV: 92.5 fL (ref 80.0–100.0)
Platelets: 203 10*3/uL (ref 150–400)
RBC: 4.42 MIL/uL (ref 3.87–5.11)
RDW: 12.9 % (ref 11.5–15.5)
WBC: 6.8 10*3/uL (ref 4.0–10.5)
nRBC: 0 % (ref 0.0–0.2)

## 2023-08-25 LAB — MAGNESIUM: Magnesium: 2.2 mg/dL (ref 1.7–2.4)

## 2023-08-25 LAB — PHOSPHORUS: Phosphorus: 3.2 mg/dL (ref 2.5–4.6)

## 2023-08-25 MED ORDER — MONTELUKAST SODIUM 10 MG PO TABS
10.0000 mg | ORAL_TABLET | Freq: Every day | ORAL | Status: DC
  Administered 2023-08-25 – 2023-08-27 (×3): 10 mg via ORAL
  Filled 2023-08-25 (×3): qty 1

## 2023-08-25 MED ORDER — ASPIRIN 81 MG PO TBEC
81.0000 mg | DELAYED_RELEASE_TABLET | Freq: Every day | ORAL | Status: DC
  Administered 2023-08-25 – 2023-08-26 (×2): 81 mg via ORAL
  Filled 2023-08-25 (×2): qty 1

## 2023-08-25 MED ORDER — LATANOPROST 0.005 % OP SOLN
1.0000 [drp] | Freq: Every day | OPHTHALMIC | Status: DC
  Administered 2023-08-25 – 2023-08-26 (×2): 1 [drp] via OPHTHALMIC
  Filled 2023-08-25: qty 2.5

## 2023-08-25 MED ORDER — METOPROLOL SUCCINATE ER 25 MG PO TB24
25.0000 mg | ORAL_TABLET | Freq: Every day | ORAL | Status: DC
  Administered 2023-08-25 – 2023-08-26 (×2): 25 mg via ORAL
  Filled 2023-08-25 (×2): qty 1

## 2023-08-25 MED ORDER — ROSUVASTATIN CALCIUM 5 MG PO TABS
10.0000 mg | ORAL_TABLET | Freq: Every day | ORAL | Status: DC
  Administered 2023-08-25 – 2023-08-27 (×3): 10 mg via ORAL
  Filled 2023-08-25 (×3): qty 2

## 2023-08-25 MED ORDER — ISOSORBIDE MONONITRATE ER 60 MG PO TB24
60.0000 mg | ORAL_TABLET | Freq: Every day | ORAL | Status: DC
  Administered 2023-08-25 – 2023-08-27 (×3): 60 mg via ORAL
  Filled 2023-08-25 (×3): qty 1

## 2023-08-25 MED ORDER — CHLORTHALIDONE 25 MG PO TABS
12.5000 mg | ORAL_TABLET | Freq: Every day | ORAL | Status: DC
  Administered 2023-08-25 – 2023-08-27 (×3): 12.5 mg via ORAL
  Filled 2023-08-25 (×3): qty 0.5

## 2023-08-25 MED ORDER — CLOPIDOGREL BISULFATE 75 MG PO TABS
75.0000 mg | ORAL_TABLET | Freq: Every day | ORAL | Status: DC
  Administered 2023-08-25 – 2023-08-27 (×3): 75 mg via ORAL
  Filled 2023-08-25 (×3): qty 1

## 2023-08-25 MED ORDER — NITROGLYCERIN 0.4 MG SL SUBL: 0.4000 mg | SUBLINGUAL_TABLET | SUBLINGUAL | Status: DC | PRN

## 2023-08-25 NOTE — Consult Note (Addendum)
Cardiology Consultation   Patient ID: Dorothy Lee MRN: [id number]; DOB: 06/27/1941  Admit date: 08/24/2023 Date of Consult: 08/25/2023  PCP:  Aliene Beams, MD   [facility] Providers Cardiologist:  Nona Dell, MD        Patient Profile:   Dorothy Lee is a 82 y.o. female with a hx of coronary artery disease (s/p DES to RCA in 2009, DES to mid-LAD in 2014, s/p cath in 08/2021 showing ISR of LAD and RCA stents which were treated with cutting balloon angioplasty), hypertension, hyperlipidemia, GERD, breast cancer who is being seen 08/25/2023 for the evaluation of chest pain at the request of hospitalist.  History of Present Illness:   Ms. Finton the patient was at a donut shop when she developed substernal chest pain radiating to her jaw bilaterally. This was very similar to symptoms she felt in the past prior to her stents.  She stated it lasted about 30 minutes.  She took 2 sublingual nitroglycerin with relief of her chest pain. Her husband brought her to the ED because she thought she was having an MI. No reported dizziness, syncope, shortness of breath, or vomiting. She denied any new medications.  She has not had any fevers, chills, coughing, hemoptysis, nausea, vomiting, diarrhea, Donnell pain, dysuria. The patient most recently saw Dr. Diona Browner in the office on 07/16/2023.  At that time, her aspirin, Plavix, Imdur, metoprolol succinate, and Crestor were continued. In the ED, the patient was afebrile and hemodynamically stable with oxygen saturation 98% on room air.  WBC 7.3, hemoglobin 14.2, platelets 231.  Sodium 140, potassium 3.5, bicarbonate 29, serum creatinine 1.14.  Troponin 12>> 12.  EKG shows sinus rhythm right bundle branch block LAFB.  Chest x-ray was negative for any infiltrates. Transferred her for further eval.     Past Medical History:  Diagnosis Date   Allergic rhinitis    CAD (coronary artery disease)    a. s/p DES to RCA in 2009 b.  DES to  mid-LAD in 2014 c. 08/2021: ISR of LAD and RCA stents treated with cutting balloon angioplasty   Cancer [facility])    Diverticulitis    Diverticulosis    Essential hypertension    GERD (gastroesophageal reflux disease)    Glaucoma    History of breast cancer    Right - s/p XRT 1984, surgery, no chemo   Hyperlipidemia    Personal history of radiation therapy    Rotator cuff syndrome, left    Scoliosis    Urinary incontinence     Past Surgical History:  Procedure Laterality Date   APPENDECTOMY  1981   BREAST BIOPSY  1984   BREAST LUMPECTOMY  1982   right breast   COLONOSCOPY  2008   Dr. Randa Evens: diverticulosis, hemorrhoids, conscious sedation   COLONOSCOPY N/A 01/30/2016   Dr. Jena Gauss: mild divertiuculosis in sigmoid colon. narrowing of the colon in association with the deverticular opening. 6 polyps removed, tubular adenoma. next TCS in 3 years   COLONOSCOPY WITH PROPOFOL N/A 04/17/2021   one 4 mm polyp at splenic flexure, otherwise normal. Tubular adenoma.   CORONARY ANGIOPLASTY WITH STENT PLACEMENT  02/2008   CORONARY BALLOON ANGIOPLASTY N/A 08/17/2021   Procedure: CORONARY BALLOON ANGIOPLASTY;  Surgeon: Lennette Bihari, MD;  Location: [facility] INVASIVE CV LAB;  Service: Cardiovascular;  Laterality: N/A;   ESOPHAGOGASTRODUODENOSCOPY  2014   Dr. Russella Dar: erosive gastritis, no h.pylori   ESOPHAGOGASTRODUODENOSCOPY N/A 01/30/2016   Dr. Jena Gauss: normal esophagus s/p dilation, small  hh.   ESOPHAGOGASTRODUODENOSCOPY (EGD) WITH PROPOFOL N/A 04/17/2021   mild Schatzki ring, s/p dilation. Small hiatal hernia. Normal duodenum.   LEFT HEART CATH AND CORONARY ANGIOGRAPHY N/A 08/17/2021   Procedure: LEFT HEART CATH AND CORONARY ANGIOGRAPHY;  Surgeon: Lennette Bihari, MD;  Location: [facility] INVASIVE CV LAB;  Service: Cardiovascular;  Laterality: N/A;   LEFT HEART CATHETERIZATION WITH CORONARY ANGIOGRAM N/A 12/02/2012   Procedure: LEFT HEART CATHETERIZATION WITH CORONARY ANGIOGRAM;  Surgeon: Corky Crafts,  MD;  Location: [facility] CATH LAB;  Service: Cardiovascular;  Laterality: N/A;   MALONEY DILATION N/A 01/30/2016   Procedure: Elease Hashimoto DILATION;  Surgeon: Corbin Ade, MD;  Location: [facility] ENDO SUITE;  Service: Endoscopy;  Laterality: N/A;   MALONEY DILATION N/A 04/17/2021   Procedure: Elease Hashimoto DILATION;  Surgeon: Corbin Ade, MD;  Location: [facility] ENDO SUITE;  Service: Endoscopy;  Laterality: N/A;   NASAL SINUS SURGERY     PARTIAL COLECTOMY N/A 05/19/2018   Procedure: PARTIAL COLECTOMY;  Surgeon: Franky Macho, MD;  Location: [facility] ORS;  Service: General;  Laterality: N/A;   PERCUTANEOUS CORONARY STENT INTERVENTION (PCI-S)  12/02/2012   Procedure: PERCUTANEOUS CORONARY STENT INTERVENTION (PCI-S);  Surgeon: Corky Crafts, MD;  Location: [facility] CATH LAB;  Service: Cardiovascular;;  DES to the MID LAD   POLYPECTOMY  04/17/2021   Procedure: POLYPECTOMY;  Surgeon: Corbin Ade, MD;  Location: [facility] ENDO SUITE;  Service: Endoscopy;;     Home Medications:  Prior to Admission medications   Medication Sig Start Date End Date Taking? Authorizing Provider  aspirin EC 81 MG tablet Take 81 mg by mouth daily.    Yes [provider]  chlorthalidone (HYGROTON) 25 MG tablet Take 0.5 tablets (12.5 mg total) by mouth daily. 07/16/23 10/14/23 Yes Jonelle Sidle, MD  clopidogrel (PLAVIX) 75 MG tablet Take 1 tablet (75 mg total) by mouth daily. 05/27/23  Yes Jonelle Sidle, MD  Cyanocobalamin (VITAMIN B-12 CR PO) Take by mouth.   Yes [provider]  DULoxetine (CYMBALTA) 30 MG capsule Take 30 mg by mouth daily. 03/23/22  Yes [provider]  ezetimibe (ZETIA) 10 MG tablet Take 1 tablet (10 mg total) by mouth daily. 08/01/23 10/30/23 Yes Jonelle Sidle, MD  isosorbide mononitrate (IMDUR) 60 MG 24 hr tablet Take 1 tablet (60 mg total) by mouth daily. 04/04/23  Yes Strader, [place] M, PA-C  latanoprost (XALATAN) 0.005 % ophthalmic solution Place 1 drop into both eyes at bedtime.  07/28/14   Yes [provider]  metoprolol succinate (TOPROL-XL) 25 MG 24 hr tablet TAKE 1 TABLET BY MOUTH DAILY 02/06/23  Yes Jonelle Sidle, MD  montelukast (SINGULAIR) 10 MG tablet Take 10 mg by mouth daily.   Yes [provider]  nitroGLYCERIN (NITROSTAT) 0.4 MG SL tablet Place 1 tablet (0.4 mg total) under the tongue every 5 (five) minutes x 3 doses as needed for chest pain. May repeat x3 04/04/23  Yes Strader, [place] M, PA-C  Wheat Dextrin (BENEFIBER PO) Take 1 Dose by mouth daily.   Yes [provider]    Inpatient Medications: Scheduled Meds:  enoxaparin (LOVENOX) injection  30 mg Subcutaneous Q24H   Continuous Infusions:  PRN Meds: acetaminophen **OR** acetaminophen, ondansetron **OR** ondansetron (ZOFRAN) IV  Allergies:    Allergies  Allergen Reactions   Crestor [Rosuvastatin]     Crestor 20 mg caused muscle aches - pt is able to tolerate lower doses (takes 10mg )   Lipitor [Atorvastatin]     Atorvastatin  40 mg caused fatigue   Lisinopril Cough   Simvastatin     Muscle aches    Social History:   Social History   Socioeconomic History   Marital status: Married    Spouse name: Not on file   Number of children: 1   Years of education: Not on file   Highest education level: Not on file  Occupational History   Occupation: hair stylist   Occupation: COSMETOLOGY    Employer: [employer]  Tobacco Use   Smoking status: Never    Passive exposure: Past   Smokeless tobacco: Never   Tobacco comments:    Widowed, lives alone but has sig other.   Vaping Use   Vaping status: Never Used  Substance and Sexual Activity   Alcohol use: Not Currently   Drug use: No   Sexual activity: Yes    Birth control/protection: Post-menopausal  Other Topics Concern   Not on file  Social History Narrative   Widowed, husband died.    Lives alone but has significant other, married in 09-16-2016 to [place].    Had one son, but he passed away.    Retired as Social worker  - Looking ahead [employer] and part-time at [employer]   Son died in 2016/09/16 of AMI. Has two grandsons.       Social Determinants of Health   Financial Resource Strain: Low Risk  (06/13/2023)   Overall Financial Resource Strain (CARDIA)    Difficulty of Paying Living Expenses: Not hard at all  Food Insecurity: No Food Insecurity (08/25/2023)   Hunger Vital Sign    Worried About Running Out of Food in the Last Year: Never true    Ran Out of Food in the Last Year: Never true  Transportation Needs: No Transportation Needs (08/25/2023)   PRAPARE - Administrator, Civil Service (Medical): No    Lack of Transportation (Non-Medical): No  Physical Activity: Inactive (06/13/2023)   Exercise Vital Sign    Days of Exercise per Week: 0 days    Minutes of Exercise per Session: 0 min  Stress: No Stress Concern Present (06/13/2023)   [employer] of Occupational Health - Occupational Stress Questionnaire    Feeling of Stress : Not at all  Social Connections: Patient Declined (06/13/2023)   Social Connection and Isolation Panel [NHANES]    Frequency of Communication with Friends and Family: Patient declined    Frequency of Social Gatherings with Friends and Family: Patient declined    Attends Religious Services: Patient declined    Database administrator or Organizations: Patient declined    Attends Banker Meetings: Patient declined    Marital Status: Patient declined  Intimate Partner Violence: Not At Risk (08/25/2023)   Humiliation, Afraid, Rape, and Kick questionnaire    Fear of Current or Ex-Partner: No    Emotionally Abused: No    Physically Abused: No    Sexually Abused: No    Family History:    Family History  Problem Relation Age of Onset   Emphysema Father    Heart attack Sister    Emphysema Brother    Heart disease Brother        x3 brothers   Lung cancer Brother    Throat cancer Brother        head/neck cancer   Heart attack Brother     Heart attack Son        blood clot   Colon cancer Neg  Hx      ROS:  Please see the history of present illness.   All other ROS reviewed and negative.     Physical Exam/Data:   Vitals:   08/25/23 1230 08/25/23 1300 08/25/23 1400 08/25/23 1644  BP:  126/74 121/72 (!) 142/71  Pulse:  82 79 83  Resp: 18 20 20 18   Temp:    97.7 F (36.5 C)  TempSrc:    Oral  SpO2:  96% 96% 99%  Weight:    58.8 kg  Height:    5\' 2"  (1.575 m)   No intake or output data in the 24 hours ending 08/25/23 1716    08/25/2023    4:44 PM 08/24/2023    3:01 PM 08/20/2023    2:05 PM  Last 3 Weights  Weight (lbs) 129 lb 10.1 oz 128 lb 131 lb 3.2 oz  Weight (kg) 58.8 kg 58.06 kg 59.512 kg     Body mass index is 23.71 kg/m.  General:  Well nourished, well developed, in no acute distress HEENT: normal Neck: no JVD Vascular: No carotid bruits; Distal pulses 2+ bilaterally Cardiac:  normal S1, S2; RRR; no murmur  Lungs:  clear to auscultation bilaterally, no wheezing, rhonchi or rales  Abd: soft, nontender, no hepatomegaly  Ext: no edema Musculoskeletal:  No deformities, BUE and BLE strength normal and equal Skin: warm and dry  Neuro:  CNs 2-12 intact, no focal abnormalities noted Psych:  Normal affect   EKG:  The EKG was personally reviewed and demonstrates:  NSR< RBBB, LAFB    Relevant CV Studies: PCI 08/2021 Two-vessel coronary obstructive disease with in-stent restenosis of the proximal to mid LAD stent with narrowing of 80 and 70%,  and 90% in-stent restenosis of the previously placed proximal to mid RCA stent and a dominant RCA.  The left circumflex coronary artery is a large caliber normal vessel.   Normal LV function with EF estimated 65% without focal segmental wall motion abnormalities.  LVEDP 14 mmHg   Successful two-vessel coronary intervention utilizing a ScorFlex 2.5 x 10 mm cutting balloon and post stent dilatation with a 2.75 x 15 mm  balloon utilized in both the RCA and LAD  vessels with the in-stent  stenosis in both vessels being reduced to 0% and brisk TIMI-3 flow.   RECOMMENDATION: DAPT for 6 to 12 months.  Medical therapy with optimal blood pressure control aggressive lipid management.  If patient cannot tolerate high potency statin therapy with possible concomitant Zetia, consider PCSK9 inhibition.  Laboratory Data:  High Sensitivity Troponin:   Recent Labs  Lab 08/24/23 1601 08/24/23 1742  TROPONINIHS 12 12     Chemistry Recent Labs  Lab 08/24/23 1601 08/25/23 0507  NA 140 138  K 3.5 3.4*  CL 102 100  CO2 29 27  GLUCOSE 107* 90  BUN 22 18  CREATININE 1.14* 1.04*  CALCIUM 8.8* 8.5*  MG  --  2.2  GFRNONAA 48* 53*  ANIONGAP 9 11    Recent Labs  Lab 08/25/23 0507  PROT 6.0*  ALBUMIN 3.3*  AST 20  ALT 18  ALKPHOS 46  BILITOT 1.2*   Lipids No results for input(s): "CHOL", "TRIG", "HDL", "LABVLDL", "LDLCALC", "CHOLHDL" in the last 168 hours.  Hematology Recent Labs  Lab 08/24/23 1601 08/25/23 0507  WBC 7.3 6.8  RBC 4.60 4.42  HGB 14.2 12.9  HCT 43.0 40.9  MCV 93.5 92.5  MCH 30.9 29.2  MCHC 33.0 31.5  RDW 13.0 12.9  PLT 231 203   Thyroid No results for input(s): "TSH", "FREET4" in the last 168 hours.  BNPNo results for input(s): "BNP", "PROBNP" in the last 168 hours.  DDimer No results for input(s): "DDIMER" in the last 168 hours.   Radiology/Studies:  DG Chest Port 1 View  Result Date: 08/24/2023 CLINICAL DATA:  Chest pain. EXAM: PORTABLE CHEST 1 VIEW COMPARISON:  May 06, 2021 FINDINGS: Calcific atherosclerotic disease of the aorta. Cardiomediastinal silhouette is normal. Mediastinal contours appear intact. There is no evidence of focal airspace consolidation, pleural effusion or pneumothorax. Osseous structures are without acute abnormality. Soft tissues are grossly normal. Post surgical changes in the right thorax. IMPRESSION: No active disease. Electronically Signed   By: Ted Mcalpine M.D.   On: 08/24/2023  16:36     Assessment and Plan:   Unstable angina  Hx of CAD s/p multiple PCIs to RCA and LAD HTN  She is high risk for obstructive CAD. Reasonable for ischemia eval with LHC/ICA. NPO tonight. Please obtain ECHO. Continue home ASA/plavix, imdur, chlorthalidone, metoprolol. Ok to hold on heparin for now but if has recurrent pain, would start heparin.   Risk Assessment/Risk Scores:  TIMI Risk Score for Unstable Angina or Non-ST Elevation MI:   The patient's TIMI risk score is 3, which indicates a 13% risk of all cause mortality, new or recurrent myocardial infarction or need for urgent revascularization in the next 14 days.        For questions or updates, please contact [facility] Please consult [email or web address] for contact info under    Signed, Adline Mango, MD  08/25/2023 5:16 PM

## 2023-08-25 NOTE — Plan of Care (Signed)

## 2023-08-25 NOTE — Progress Notes (Signed)
PROGRESS NOTE  Dorothy Lee ZOX:[id number] DOB: 1941-04-02 DOA: 08/24/2023 PCP: Aliene Beams, MD  Brief History:  82 year old female with a history of coronary artery disease (s/p DES to RCA in 2009, DES to mid-LAD in 2014, s/p cath in 08/2021 showing ISR of LAD and RCA stents which were treated with cutting balloon angioplasty), hypertension, hyperlipidemia, GERD, breast cancer presenting to the emergency department secondary to substernal chest pain.  The patient was at a donut shop when she developed sharp substernal chest pain radiating to her jaw.  She stated it lasted about 30 minutes.  She took 2 sublingual nitroglycerin with relief of her chest pain.  She denied any dizziness, syncope, shortness of breath, or vomiting.  She stated that the pain was similar to her previous heart attacks.   She denied any new medications.  She has not any fevers, chills, coughing, hemoptysis, nausea, vomiting, diarrhea, Donnell pain, dysuria. The patient most recently saw Dr. Diona Browner in the office on 07/16/2023.  At that time, her aspirin, Plavix, Imdur, metoprolol succinate, and Crestor were continued.  The patient states that she has not had to use any sublingual nitroglycerin since her visit with Dr. Diona Browner until 08/24/2023. In the ED, the patient was afebrile and hemodynamically stable with oxygen saturation 98% on room air.  WBC 7.3, hemoglobin 14.2, platelets 231.  Sodium 140, potassium 3.5, bicarbonate 29, serum creatinine 1.14.  Troponin 12>> 12.  EKG shows sinus rhythm right bundle branch block.  Chest x-ray was negative for any infiltrates. Cardiologist on-call at [facility] was consulted and recommended admitting the patient to [facility] with plan to consult on patient on arrival to [facility] per EDP.    Assessment/Plan: Chest pain -Patient has had history of stable angina -Troponin 12>> 12 -Personally reviewed EKG--sinus rhythm, right bundle branch block -Continue aspirin, Plavix, Imdur -currently CP  free  Essential hypertension -Continue metoprolol succinate  Mixed hyperlipidemia -no longer taking zetia -restart crestor  Coronary artery disease -Continue aspirin, Plavix, metoprolol succinate, Imdur          Family Communication:  no Family at bedside  Consultants:  cardiology  Code Status:  FULL  DVT Prophylaxis:   [place] Lovenox   Procedures: As Listed in Progress Note Above  Antibiotics: None        Subjective: Patient denies fevers, chills, headache, chest pain, dyspnea, nausea, vomiting, diarrhea, abdominal pain, dysuria, hematuria, hematochezia, and melena.  Objective: Vitals:   08/25/23 0200 08/25/23 0400 08/25/23 0600 08/25/23 0615  BP: 126/65 132/62 134/66   Pulse: 64 61 61   Resp: 14 (!) 22 18   Temp:    97.7 F (36.5 C)  TempSrc:    Oral  SpO2: 97% 98% 95%   Weight:      Height:       No intake or output data in the 24 hours ending 08/25/23 0737 Weight change:  Exam:  General:  Pt is alert, follows commands appropriately, not in acute distress HEENT: No icterus, No thrush, No neck mass, [place]/AT Cardiovascular: RRR, S1/S2, no rubs, no gallops Respiratory: CTA bilaterally, no wheezing, no crackles, no rhonchi Abdomen: Soft/+BS, non tender, non distended, no guarding Extremities: No edema, No lymphangitis, No petechiae, No rashes, no synovitis   Data Reviewed: I have personally reviewed following labs and imaging studies Basic Metabolic Panel: Recent Labs  Lab 08/24/23 1601 08/25/23 0507  NA 140 138  K 3.5 3.4*  CL 102 100  CO2 29  27  GLUCOSE 107* 90  BUN 22 18  CREATININE 1.14* 1.04*  CALCIUM 8.8* 8.5*  MG  --  2.2  PHOS  --  3.2   Liver Function Tests: Recent Labs  Lab 08/25/23 0507  AST 20  ALT 18  ALKPHOS 46  BILITOT 1.2*  PROT 6.0*  ALBUMIN 3.3*   No results for input(s): "LIPASE", "AMYLASE" in the last 168 hours. No results for input(s): "AMMONIA" in the last 168 hours. Coagulation Profile: No results for  input(s): "INR", "PROTIME" in the last 168 hours. CBC: Recent Labs  Lab 08/24/23 1601 08/25/23 0507  WBC 7.3 6.8  HGB 14.2 12.9  HCT 43.0 40.9  MCV 93.5 92.5  PLT 231 203   Cardiac Enzymes: No results for input(s): "CKTOTAL", "CKMB", "CKMBINDEX", "TROPONINI" in the last 168 hours. BNP: Invalid input(s): "POCBNP" CBG: No results for input(s): "GLUCAP" in the last 168 hours. HbA1C: No results for input(s): "HGBA1C" in the last 72 hours. Urine analysis:    Component Value Date/Time   COLORURINE YELLOW 07/17/2022 1157   APPEARANCEUR Clear 06/13/2023 1530   LABSPEC 1.017 07/17/2022 1157   PHURINE 5.5 07/17/2022 1157   GLUCOSEU Negative 06/13/2023 1530   HGBUR NEGATIVE 07/17/2022 1157   BILIRUBINUR Negative 06/13/2023 1530   KETONESUR 15 (A) 07/17/2022 1157   PROTEINUR Negative 06/13/2023 1530   PROTEINUR NEGATIVE 07/17/2022 1157   UROBILINOGEN 0.2 07/25/2011 1454   NITRITE Negative 06/13/2023 1530   NITRITE NEGATIVE 07/17/2022 1157   LEUKOCYTESUR Negative 06/13/2023 1530   LEUKOCYTESUR NEGATIVE 07/17/2022 1157   Sepsis Labs: @LABRCNTIP (procalcitonin:4,lacticidven:4) )No results found for this or any previous visit (from the past 240 hour(s)).   Scheduled Meds:  enoxaparin (LOVENOX) injection  30 mg Subcutaneous Q24H   Continuous Infusions:  Procedures/Studies: DG Chest Port 1 View  Result Date: 08/24/2023 CLINICAL DATA:  Chest pain. EXAM: PORTABLE CHEST 1 VIEW COMPARISON:  May 06, 2021 FINDINGS: Calcific atherosclerotic disease of the aorta. Cardiomediastinal silhouette is normal. Mediastinal contours appear intact. There is no evidence of focal airspace consolidation, pleural effusion or pneumothorax. Osseous structures are without acute abnormality. Soft tissues are grossly normal. Post surgical changes in the right thorax. IMPRESSION: No active disease. Electronically Signed   By: Ted Mcalpine M.D.   On: 08/24/2023 16:36    Catarina Hartshorn, DO  [facility]  If 7PM-7AM, please contact night-coverage [email or web address] Password [email or web address] 08/25/2023, 7:37 AM   LOS: 0 days

## 2023-08-25 NOTE — Hospital Course (Signed)
82 year old female with a history of coronary artery disease (s/p DES to RCA in 2009, DES to mid-LAD in 2014, s/p cath in 08/2021 showing ISR of LAD and RCA stents which were treated with cutting balloon angioplasty), hypertension, hyperlipidemia, GERD, breast cancer presenting to the emergency department secondary to substernal chest pain.  The patient was at a donut shop when she developed sharp substernal chest pain radiating to her jaw.  She stated it lasted about 30 minutes.  She took 2 sublingual nitroglycerin with relief of her chest pain.  She denied any dizziness, syncope, shortness of breath, or vomiting.  She stated that the pain was similar to her previous heart attacks.   She denied any new medications.  She has not any fevers, chills, coughing, hemoptysis, nausea, vomiting, diarrhea, Donnell pain, dysuria. The patient most recently saw Dr. Diona Browner in the office on 07/16/2023.  At that time, her aspirin, Plavix, Imdur, metoprolol succinate, and Crestor were continued.  The patient states that she has not had to use any sublingual nitroglycerin since her visit with Dr. Diona Browner until 08/24/2023. In the ED, the patient was afebrile and hemodynamically stable with oxygen saturation 98% on room air.  WBC 7.3, hemoglobin 14.2, platelets 231.  Sodium 140, potassium 3.5, bicarbonate 29, serum creatinine 1.14.  Troponin 12>> 12.  EKG shows sinus rhythm right bundle branch block.  Chest x-ray was negative for any infiltrates. Cardiologist on-call at [facility] was consulted and recommended admitting the patient to [facility] with plan to consult on patient on arrival to [facility] per EDP.

## 2023-08-25 NOTE — Telephone Encounter (Signed)
Spoke with husband. DPR on file. He is concerned that the pt is still at [facility]. I reassured him that her enzymes were normal, EKG did not show acute MI and her symptoms were under control. She will be transferred when a bed becomes available. He expressed understanding. Tereso Newcomer, PA-C    08/25/2023 9:40 AM

## 2023-08-25 NOTE — Telephone Encounter (Signed)
ZAHLI SUMMITT is a 82 y.o. female admitted to [facility] last night with [place]. Troponins neg x 2. ECG w inf-lat TW inversions. Notes from H&P indicate pt pain free. Fellow last night accepted pt and she is currently pending transfer to [facility].   Husband called answering service Rayna Sexton [place] - DPR on file) to check on status. Returned call but no answer. Message left on voicemail to call us back if needed.  Tereso Newcomer, PA-C    08/25/2023 9:03 AM

## 2023-08-26 ENCOUNTER — Telehealth: Payer: Self-pay

## 2023-08-26 ENCOUNTER — Encounter (HOSPITAL_COMMUNITY)
Admission: EM | Disposition: A | Discharge status: Home / Self Care | Payer: Self-pay | Source: Home / Self Care | Attending: Internal Medicine

## 2023-08-26 DIAGNOSIS — I25110 Atherosclerotic heart disease of native coronary artery with unstable angina pectoris: Secondary | ICD-10-CM

## 2023-08-26 DIAGNOSIS — R079 Chest pain, unspecified: Secondary | ICD-10-CM | POA: Diagnosis not present

## 2023-08-26 DIAGNOSIS — I200 Unstable angina: Secondary | ICD-10-CM

## 2023-08-26 HISTORY — PX: LEFT HEART CATH AND CORONARY ANGIOGRAPHY: CATH118249

## 2023-08-26 SURGERY — LEFT HEART CATH AND CORONARY ANGIOGRAPHY
Anesthesia: LOCAL

## 2023-08-26 MED ORDER — HEPARIN SODIUM (PORCINE) 1000 UNIT/ML IJ SOLN
INTRAMUSCULAR | Status: AC
  Filled 2023-08-26: qty 10

## 2023-08-26 MED ORDER — SODIUM CHLORIDE 0.9 % IV SOLN: INTRAVENOUS | Status: AC

## 2023-08-26 MED ORDER — SODIUM CHLORIDE 0.9 % WEIGHT BASED INFUSION
1.0000 mL/kg/h | INTRAVENOUS | Status: DC
  Administered 2023-08-26: 1 mL/kg/h via INTRAVENOUS

## 2023-08-26 MED ORDER — LIDOCAINE HCL (PF) 1 % IJ SOLN
INTRAMUSCULAR | Status: DC | PRN
  Administered 2023-08-26: 2 mL

## 2023-08-26 MED ORDER — LIDOCAINE HCL (PF) 1 % IJ SOLN
INTRAMUSCULAR | Status: AC
  Filled 2023-08-26: qty 30

## 2023-08-26 MED ORDER — POTASSIUM CHLORIDE 10 MEQ/100ML IV SOLN
10.0000 meq | INTRAVENOUS | Status: AC
Start: 2023-08-26 — End: 2023-08-26
  Administered 2023-08-26 (×2): 10 meq via INTRAVENOUS
  Filled 2023-08-26 (×2): qty 100

## 2023-08-26 MED ORDER — HEPARIN SODIUM (PORCINE) 1000 UNIT/ML IJ SOLN
INTRAMUSCULAR | Status: DC | PRN
  Administered 2023-08-26: 3000 [IU] via INTRAVENOUS

## 2023-08-26 MED ORDER — METOPROLOL SUCCINATE ER 25 MG PO TB24
25.0000 mg | ORAL_TABLET | Freq: Two times a day (BID) | ORAL | Status: DC
  Administered 2023-08-26 – 2023-08-27 (×2): 25 mg via ORAL
  Filled 2023-08-26 (×2): qty 1

## 2023-08-26 MED ORDER — ASPIRIN 81 MG PO CHEW: 81.0000 mg | CHEWABLE_TABLET | ORAL | Status: DC

## 2023-08-26 MED ORDER — HEPARIN (PORCINE) IN NACL 1000-0.9 UT/500ML-% IV SOLN
INTRAVENOUS | Status: DC | PRN
  Administered 2023-08-26 (×2): 500 mL

## 2023-08-26 MED ORDER — SODIUM CHLORIDE 0.9% FLUSH
3.0000 mL | Freq: Two times a day (BID) | INTRAVENOUS | Status: DC
  Administered 2023-08-27: 3 mL via INTRAVENOUS

## 2023-08-26 MED ORDER — FENTANYL CITRATE (PF) 100 MCG/2ML IJ SOLN
INTRAMUSCULAR | Status: DC | PRN
  Administered 2023-08-26: 12.5 ug via INTRAVENOUS

## 2023-08-26 MED ORDER — SODIUM CHLORIDE 0.9 % IV SOLN: 250.0000 mL | INTRAVENOUS | Status: DC | PRN

## 2023-08-26 MED ORDER — ASPIRIN 81 MG PO TBEC: 81.0000 mg | DELAYED_RELEASE_TABLET | Freq: Every day | ORAL | Status: DC

## 2023-08-26 MED ORDER — SODIUM CHLORIDE 0.9% FLUSH: 3.0000 mL | INTRAVENOUS | Status: DC | PRN

## 2023-08-26 MED ORDER — MIDAZOLAM HCL 2 MG/2ML IJ SOLN
INTRAMUSCULAR | Status: AC
  Filled 2023-08-26: qty 2

## 2023-08-26 MED ORDER — IOHEXOL 350 MG/ML SOLN
INTRAVENOUS | Status: DC | PRN
  Administered 2023-08-26: 35 mL via INTRA_ARTERIAL

## 2023-08-26 MED ORDER — LABETALOL HCL 5 MG/ML IV SOLN: 10.0000 mg | INTRAVENOUS | Status: AC | PRN

## 2023-08-26 MED ORDER — VERAPAMIL HCL 2.5 MG/ML IV SOLN
INTRAVENOUS | Status: DC | PRN
  Administered 2023-08-26: 10 mL via INTRA_ARTERIAL

## 2023-08-26 MED ORDER — HYDRALAZINE HCL 20 MG/ML IJ SOLN: 10.0000 mg | INTRAMUSCULAR | Status: AC | PRN

## 2023-08-26 MED ORDER — VERAPAMIL HCL 2.5 MG/ML IV SOLN
INTRAVENOUS | Status: AC
  Filled 2023-08-26: qty 2

## 2023-08-26 MED ORDER — SODIUM CHLORIDE 0.9 % WEIGHT BASED INFUSION
3.0000 mL/kg/h | INTRAVENOUS | Status: DC
Start: 2023-08-26 — End: 2023-08-26
  Administered 2023-08-26: 3 mL/kg/h via INTRAVENOUS

## 2023-08-26 MED ORDER — MIDAZOLAM HCL 2 MG/2ML IJ SOLN
INTRAMUSCULAR | Status: DC | PRN
  Administered 2023-08-26: .5 mg via INTRAVENOUS

## 2023-08-26 MED ORDER — DULOXETINE HCL 30 MG PO CPEP
30.0000 mg | ORAL_CAPSULE | Freq: Every day | ORAL | Status: DC
  Administered 2023-08-26 – 2023-08-27 (×2): 30 mg via ORAL
  Filled 2023-08-26 (×2): qty 1

## 2023-08-26 MED ORDER — FENTANYL CITRATE (PF) 100 MCG/2ML IJ SOLN
INTRAMUSCULAR | Status: AC
  Filled 2023-08-26: qty 2

## 2023-08-26 SURGICAL SUPPLY — 10 items
CATH 5FR JL3.5 JR4 ANG PIG MP (CATHETERS) IMPLANT
DEVICE RAD COMP TR BAND LRG (VASCULAR PRODUCTS) IMPLANT
GLIDESHEATH SLEND A-KIT 6F 22G (SHEATH) IMPLANT
GLIDESHEATH SLEND SS 6F .021 (SHEATH) IMPLANT
GUIDEWIRE INQWIRE 1.5J.035X260 (WIRE) IMPLANT
INQWIRE 1.5J .035X260CM (WIRE) ×2
PACK CARDIAC CATHETERIZATION (CUSTOM PROCEDURE TRAY) ×1 IMPLANT
SET ATX-X65L (MISCELLANEOUS) IMPLANT
SHEATH PROBE COVER 6X72 (BAG) IMPLANT
WIRE HI TORQ VERSACORE-J 145CM (WIRE) IMPLANT

## 2023-08-26 NOTE — Interval H&P Note (Signed)
History and Physical Interval Note:  08/26/2023 5:36 PM  Dorothy Lee  has presented today for surgery, with the diagnosis of unstable angina.  The various methods of treatment have been discussed with the patient and family. After consideration of risks, benefits and other options for treatment, the patient has consented to  Procedure(s): LEFT HEART CATH AND CORONARY ANGIOGRAPHY (N/A) as a surgical intervention.  The patient's history has been reviewed, patient examined, no change in status, stable for surgery.  I have reviewed the patient's chart and labs.  Questions were answered to the patient's satisfaction.    Cath Lab Visit (complete for each Cath Lab visit)  Clinical Evaluation Leading to the Procedure:   ACS: Yes.    Non-ACS:  N/A  Regina Ganci

## 2023-08-26 NOTE — H&P (View-Only) (Signed)
   Patient Name: Dorothy Lee Date of Encounter: 08/26/2023 [facility] Cardiologist: Nona Dell, MD   Interval Summary  .    No chest pain, last symptoms were Friday evening prompting her presentation to [facility]   Vital Signs .    Vitals:   08/26/23 0046 08/26/23 0444 08/26/23 0502 08/26/23 0720  BP: (!) 112/54 (!) 119/58  (!) 121/53  Pulse: (!) 53 75  73  Resp: 17 18  18   Temp: 98.6 F (37 C) 98.2 F (36.8 C)  (!) 97.5 F (36.4 C)  TempSrc: Oral Oral  Oral  SpO2: 96% 97%  95%  Weight:   58.6 kg   Height:        Intake/Output Summary (Last 24 hours) at 08/26/2023 1107 Last data filed at 08/25/2023 1700 Gross per 24 hour  Intake 120 ml  Output --  Net 120 ml      08/26/2023    5:02 AM 08/25/2023    4:44 PM 08/24/2023    3:01 PM  Last 3 Weights  Weight (lbs) 129 lb 3 oz 129 lb 10.1 oz 128 lb  Weight (kg) 58.6 kg 58.8 kg 58.06 kg      Telemetry/ECG    Sinus Rhythm - Personally Reviewed  Physical Exam .   GEN: No acute distress.   Neck: No JVD Cardiac: RRR, no murmurs, rubs, or gallops.  Respiratory: Clear to auscultation bilaterally. GI: Soft, nontender, non-distended  MS: No edema  Assessment & Plan .     82 y.o. female with a hx of coronary artery disease (s/p DES to RCA in 2009, DES to mid-LAD in 2014, s/p cath in 08/2021 showing ISR of LAD and RCA stents which were treated with cutting balloon angioplasty), hypertension, hyperlipidemia, GERD, breast cancer who was seen 08/25/2023 for the evaluation of chest pain at the request of hospitalist.   Unstable Angina CAD s/p prior DES to LAD, RCA -- presented with centralized chest pain with radiation into the jaw. hsTn 12>>12. Symptoms very similar to what she has experienced with prior anginal episodes.  -- on ASA, plavix, Toprol XL 25mg  daily, Crestor 10mg  daily -- planned for cardiac catheterization later today  Informed Consent   Shared Decision Making/Informed Consent The risks [stroke  (1 in 1000), death (1 in 1000), kidney failure [usually temporary] (1 in 500), bleeding (1 in 200), allergic reaction [possibly serious] (1 in 200)], benefits (diagnostic support and management of coronary artery disease) and alternatives of a cardiac catheterization were discussed in detail with Ms. Saint Martin and she is willing to proceed.  HTN -- controlled -- on Toprol XL 25mg  daily, chlorthalidone 12.5mg  daily   HLD -- on Crestor 10mg  daily, intolerant to higher doses  Hx of breast CA  For questions or updates, please contact  [facility] Please consult [email or web address] for contact info under        Signed, Laverda Page, NP   I have personally seen and examined this patient. I agree with the assessment and plan as outlined above.  Pt admitted with chest pain that is reminiscent of her prior angina. Troponin negative. No ischemic EKG changes. Plans in place for cardiac catheterization today. Pt is NPO.   Verne Carrow, MD, [facility] 08/26/2023 11:48 AM

## 2023-08-26 NOTE — Telephone Encounter (Signed)
-----   Message from [facility] sent at 08/23/2023  1:26 PM EST ----- Normal B12. -VRP

## 2023-08-26 NOTE — Progress Notes (Signed)
PROGRESS NOTE Dorothy Lee  ZOX:[id number] DOB: Feb 07, 1941 DOA: 08/24/2023 PCP: Aliene Beams, MD  Brief Narrative/Hospital Course: 23 yof w/ CAD s/p DES to RCA in 2009, DES to mid-LAD in 2014, s/p cath in 08/2021 showing ISR of LAD and RCA stents which were treated with cutting balloon angioplasty), hypertension, hyperlipidemia, GERD, breast cancer presenting to the ED for substernal chest pain.  Seen in the ED, vitals stable labs with troponin negative x 2 EKG sinus rhythm right bundle branch block chest x-ray unremarkable, cardiology was consulted and advised transfer to [facility] for unstable angina for ischemic workup Patient transferred to [facility] 11/17    Subjective: Seen and examined this morning Resting comfortably no chest pain   Assessment and Plan: Principal Problem:   Chest pain Active Problems:   Essential hypertension   Allergic rhinitis   CAD S/P percutaneous coronary angioplasty   Mixed hyperlipidemia   Glaucoma   Unstable angina History of CAD status post multiple PCI to RCA and LAD: Troponin negative so far.  Follow-up echocardiogram, patient is high risk for obstructive CAD, cardiology following Continue aspirin, Plavix, Imdur, metoprolol/statin.  Holding off on heparin but low threshold to restart if has recurrent pain  Hypertension: Well-controlled.  Continue chlorthalidone, Imdur, metoprolol  Mixed HLD: Resume Crestor no longer taking Zetia  Hypokalemia: Replaced  CKD 3a: At baseline creatinine 1.0  DVT prophylaxis: enoxaparin (LOVENOX) injection 30 mg Start: 08/24/23 2200 SCDs Start: 08/24/23 2003 Code Status:   Code Status: Full Code Family Communication: plan of care discussed with patient at bedside. Patient status is: Inpatient because of unstable angina Level of care: Telemetry Cardiac   Dispo: The patient is from: Home with husband in [place]            Anticipated disposition: TBD  Objective: Vitals last 24 hrs: Vitals:    08/26/23 0046 08/26/23 0444 08/26/23 0502 08/26/23 0720  BP: (!) 112/54 (!) 119/58  (!) 121/53  Pulse: (!) 53 75  73  Resp: 17 18  18   Temp: 98.6 F (37 C) 98.2 F (36.8 C)  (!) 97.5 F (36.4 C)  TempSrc: Oral Oral  Oral  SpO2: 96% 97%  95%  Weight:   58.6 kg   Height:       Weight change: 0.74 kg  Physical Examination: General exam: alert awake, older than stated age HEENT:Oral mucosa moist, Ear/Nose WNL grossly Respiratory system: bilaterally CLEAR BS, no use of accessory muscle Cardiovascular system: S1 & S2 +, No JVD. Gastrointestinal system: Abdomen soft,NT,ND, BS+ Nervous System:Alert, awake, moving extremities. Extremities: LE edema NEG,distal peripheral pulses palpable.  Skin: No rashes,no icterus. MSK: Normal muscle bulk,tone, power  Medications reviewed:  Scheduled Meds:  aspirin EC  81 mg Oral Daily   chlorthalidone  12.5 mg Oral Daily   clopidogrel  75 mg Oral Daily   enoxaparin (LOVENOX) injection  30 mg Subcutaneous Q24H   isosorbide mononitrate  60 mg Oral Daily   latanoprost  1 drop Both Eyes QHS   metoprolol succinate  25 mg Oral Daily   montelukast  10 mg Oral Daily   rosuvastatin  10 mg Oral Daily   Continuous Infusions:  potassium chloride 10 mEq (08/26/23 0904)      Diet Order             Diet NPO time specified Except for: Sips with Meds  Diet effective midnight                  Intake/Output  Summary (Last 24 hours) at 08/26/2023 1035 Last data filed at 08/25/2023 1700 Gross per 24 hour  Intake 120 ml  Output --  Net 120 ml   Net IO Since Admission: 120 mL [08/26/23 1035]  Wt Readings from Last 3 Encounters:  08/26/23 58.6 kg  08/20/23 59.5 kg  07/16/23 62.4 kg     Unresulted Labs (From admission, onward)     Start     Ordered   08/31/23 0500  Creatinine, serum  (enoxaparin (LOVENOX)    CrCl >/= 30 ml/min)  Weekly,   R     Comments: while on enoxaparin therapy    08/24/23 2004   08/27/23 0500  Basic metabolic panel   Daily,   R     Question:  Specimen collection method  Answer:  Lab=Lab collect   08/26/23 0833   08/27/23 0500  CBC  Tomorrow morning,   R       Question:  Specimen collection method  Answer:  Lab=Lab collect   08/26/23 0833          Data Reviewed: I have personally reviewed following labs and imaging studies CBC: Recent Labs  Lab 08/24/23 1601 08/25/23 0507  WBC 7.3 6.8  HGB 14.2 12.9  HCT 43.0 40.9  MCV 93.5 92.5  PLT 231 203   Basic Metabolic Panel: Recent Labs  Lab 08/24/23 1601 08/25/23 0507  NA 140 138  K 3.5 3.4*  CL 102 100  CO2 29 27  GLUCOSE 107* 90  BUN 22 18  CREATININE 1.14* 1.04*  CALCIUM 8.8* 8.5*  MG  --  2.2  PHOS  --  3.2   GFR: Estimated Creatinine Clearance: 32.4 mL/min (A) (by C-G formula based on SCr of 1.04 mg/dL (H)). Liver Function Tests: Recent Labs  Lab 08/25/23 0507  AST 20  ALT 18  ALKPHOS 46  BILITOT 1.2*  PROT 6.0*  ALBUMIN 3.3*  Antimicrobials: Anti-infectives (From admission, onward)    None      Culture/Microbiology    Component Value Date/Time   SDES  11/17/2017 1913    URINE, CLEAN CATCH Performed at [facility], [address], [place], [place] [address]    [facility]  11/17/2017 1913    NONE Performed at [facility], [address], [place], [place] [address]    CULT >=100,000 COLONIES/mL ESCHERICHIA COLI (A) 11/17/2017 1913   REPTSTATUS 11/20/2017 FINAL 11/17/2017 1913    Radiology Studies: [facility] Chest Port 1 View  Result Date: 08/24/2023 CLINICAL DATA:  Chest pain. EXAM: PORTABLE CHEST 1 VIEW COMPARISON:  May 06, 2021 FINDINGS: Calcific atherosclerotic disease of the aorta. Cardiomediastinal silhouette is normal. Mediastinal contours appear intact. There is no evidence of focal airspace consolidation, pleural effusion or pneumothorax. Osseous structures are without acute abnormality. Soft tissues are grossly normal. Post surgical changes in the right thorax. IMPRESSION: No active disease. Electronically  Signed   By: Ted Mcalpine M.D.   On: 08/24/2023 16:36     LOS: 1 day   Lanae Boast, MD [facility]  08/26/2023, 10:35 AM

## 2023-08-26 NOTE — Telephone Encounter (Signed)
Called pt and informed her that the B12 labs come back normal. Pt verbalized understanding. Pt had no questions at this time but was encouraged to call back if questions arise.

## 2023-08-26 NOTE — Progress Notes (Addendum)
   Patient Name: Dorothy Lee Date of Encounter: 08/26/2023 [facility] Cardiologist: Nona Dell, MD   Interval Summary  .    No chest pain, last symptoms were Friday evening prompting her presentation to [facility]   Vital Signs .    Vitals:   08/26/23 0046 08/26/23 0444 08/26/23 0502 08/26/23 0720  BP: (!) 112/54 (!) 119/58  (!) 121/53  Pulse: (!) 53 75  73  Resp: 17 18  18   Temp: 98.6 F (37 C) 98.2 F (36.8 C)  (!) 97.5 F (36.4 C)  TempSrc: Oral Oral  Oral  SpO2: 96% 97%  95%  Weight:   58.6 kg   Height:        Intake/Output Summary (Last 24 hours) at 08/26/2023 1107 Last data filed at 08/25/2023 1700 Gross per 24 hour  Intake 120 ml  Output --  Net 120 ml      08/26/2023    5:02 AM 08/25/2023    4:44 PM 08/24/2023    3:01 PM  Last 3 Weights  Weight (lbs) 129 lb 3 oz 129 lb 10.1 oz 128 lb  Weight (kg) 58.6 kg 58.8 kg 58.06 kg      Telemetry/ECG    Sinus Rhythm - Personally Reviewed  Physical Exam .   GEN: No acute distress.   Neck: No JVD Cardiac: RRR, no murmurs, rubs, or gallops.  Respiratory: Clear to auscultation bilaterally. GI: Soft, nontender, non-distended  MS: No edema  Assessment & Plan .     82 y.o. female with a hx of coronary artery disease (s/p DES to RCA in 2009, DES to mid-LAD in 2014, s/p cath in 08/2021 showing ISR of LAD and RCA stents which were treated with cutting balloon angioplasty), hypertension, hyperlipidemia, GERD, breast cancer who was seen 08/25/2023 for the evaluation of chest pain at the request of hospitalist.   Unstable Angina CAD s/p prior DES to LAD, RCA -- presented with centralized chest pain with radiation into the jaw. hsTn 12>>12. Symptoms very similar to what she has experienced with prior anginal episodes.  -- on ASA, plavix, Toprol XL 25mg  daily, Crestor 10mg  daily -- planned for cardiac catheterization later today  Informed Consent   Shared Decision Making/Informed Consent The risks [stroke  (1 in 1000), death (1 in 1000), kidney failure [usually temporary] (1 in 500), bleeding (1 in 200), allergic reaction [possibly serious] (1 in 200)], benefits (diagnostic support and management of coronary artery disease) and alternatives of a cardiac catheterization were discussed in detail with Ms. Saint Martin and she is willing to proceed.  HTN -- controlled -- on Toprol XL 25mg  daily, chlorthalidone 12.5mg  daily   HLD -- on Crestor 10mg  daily, intolerant to higher doses  Hx of breast CA  For questions or updates, please contact [facility] Please consult [email or web address] for contact info under        Signed, Laverda Page, NP   I have personally seen and examined this patient. I agree with the assessment and plan as outlined above.  Pt admitted with chest pain that is reminiscent of her prior angina. Troponin negative. No ischemic EKG changes. Plans in place for cardiac catheterization today. Pt is NPO.   Verne Carrow, MD, [facility] 08/26/2023 11:48 AM

## 2023-08-26 NOTE — TOC Initial Note (Signed)
Transition of Care [facility]) - Initial/Assessment Note    Patient Details  Name: Dorothy Lee MRN: [id number] Date of Birth: 15-Nov-1940  [facility]) CM/SW Contact:    Michaela Corner, LCSWA Phone Number: 08/26/2023, 4:03 PM  Clinical Narrative:  Pt states she lives at home with her spouse. She reports that she does drive. Pt has a PCP and uses [facility] [address]. Pt reports no DME or HH at this time.     08/26/23 1603  TOC Brief Assessment  Insurance and Status Reviewed  Patient has primary care physician Yes  Home environment has been reviewed from home w/ spouse  Prior level of function: Independent  Prior/Current Home Services No current home services  Social Determinants of Health Reivew SDOH reviewed no interventions necessary  Readmission risk has been reviewed Yes  Transition of care needs no transition of care needs at this time      Activities of Daily Living   ADL Screening (condition at time of admission) Independently performs ADLs?: Yes (appropriate for developmental age) Is the patient deaf or have difficulty hearing?: No Does the patient have difficulty seeing, even when wearing glasses/contacts?: No Does the patient have difficulty concentrating, remembering, or making decisions?: No  Permission Sought/Granted                  Emotional Assessment              Admission diagnosis:  Chest pain [R07.9] Chest pain, unspecified type [R07.9] Patient Active Problem List   Diagnosis Date Noted   Chest pain 08/24/2023   Mixed hyperlipidemia 08/24/2023   Glaucoma 08/24/2023   Lower abdominal pain 06/06/2022   CAD S/P percutaneous coronary angioplasty 08/17/2021   Accelerating angina (HCC)    Chest pain, musculoskeletal 12/17/2018   Wound infection after surgery    S/P partial colectomy 05/19/2018   Sigmoid diverticulosis    Dyspareunia in female 01/24/2018   Diverticulitis of large intestine with perforation without  abscess or bleeding    Diverticulitis 11/17/2017   Suprapubic abdominal pain 10/22/2017   Lower urinary tract symptoms (LUTS) 10/22/2017   Constipation 04/24/2017   Fatty liver 04/24/2017   Solitary pulmonary nodule 10/05/2016   LLQ pain 08/10/2016   History of colonic polyps    Diverticulosis of colon without hemorrhage    Dysphagia    Esophageal dysphagia 01/05/2016   Acute diverticulitis 01/05/2016   Postoperative breast asymmetry 06/02/2015   Cough variant asthma 01/19/2014   Weight loss, unintentional 09/29/2013   Upper airway cough syndrome 11/20/2010   Chronic sinusitis 11/08/2010   Allergic rhinitis 11/08/2010   History of cardiovascular disorder 10/05/2010   Disorder of bursae and tendons in shoulder region 09/25/2010   GERD 09/22/2010   URINARY INCONTINENCE 09/22/2010   Dyslipidemia 09/15/2010   Essential hypertension 09/15/2010   Coronary atherosclerosis 09/15/2010   BREAST CANCER, HX OF 09/15/2010   PCP:  Aliene Beams, MD Pharmacy:   [facility] [phone] [place], [place] - [address] AT [facility] [address] [facility] [address] [place] [address] Phone: [phone] Fax: [phone]  [facility] [address] [place] [address] Phone: [phone] Fax: [phone]     Social Determinants of Health (SDOH) Social History: SDOH Screenings   Food Insecurity: No Food Insecurity (08/25/2023)  Housing: Low Risk  (08/25/2023)  Transportation Needs: No Transportation Needs (08/25/2023)  Utilities: Not At Risk (08/25/2023)  Alcohol Screen: Low Risk  (  06/13/2023)  Depression (PHQ2-9): Low Risk  (06/13/2023)  Financial Resource Strain: Low Risk  (06/13/2023)  Physical Activity: Inactive (06/13/2023)  Social Connections: Patient Declined (06/13/2023)  Stress: No Stress Concern Present (06/13/2023)  Tobacco Use: Low Risk  (08/24/2023)  Health Literacy: Adequate Health Literacy (06/13/2023)   SDOH  Interventions:     Readmission Risk Interventions     No data to display

## 2023-08-26 NOTE — Plan of Care (Signed)

## 2023-08-27 ENCOUNTER — Encounter (HOSPITAL_COMMUNITY): Payer: Self-pay | Admitting: Internal Medicine

## 2023-08-27 ENCOUNTER — Other Ambulatory Visit (HOSPITAL_COMMUNITY): Payer: Self-pay

## 2023-08-27 DIAGNOSIS — R079 Chest pain, unspecified: Secondary | ICD-10-CM | POA: Diagnosis not present

## 2023-08-27 LAB — CBC
HCT: 40.1 % (ref 36.0–46.0)
Hemoglobin: 13.1 g/dL (ref 12.0–15.0)
MCH: 29.9 pg (ref 26.0–34.0)
MCHC: 32.7 g/dL (ref 30.0–36.0)
MCV: 91.6 fL (ref 80.0–100.0)
Platelets: 202 10*3/uL (ref 150–400)
RBC: 4.38 MIL/uL (ref 3.87–5.11)
RDW: 12.8 % (ref 11.5–15.5)
WBC: 6.5 10*3/uL (ref 4.0–10.5)
nRBC: 0 % (ref 0.0–0.2)

## 2023-08-27 LAB — BASIC METABOLIC PANEL
Anion gap: 10 (ref 5–15)
BUN: 17 mg/dL (ref 8–23)
CO2: 19 mmol/L — ABNORMAL LOW (ref 22–32)
Calcium: 8.4 mg/dL — ABNORMAL LOW (ref 8.9–10.3)
Chloride: 107 mmol/L (ref 98–111)
Creatinine, Ser: 0.88 mg/dL (ref 0.44–1.00)
GFR, Estimated: >60 mL/min (ref >60)
Glucose, Bld: 76 mg/dL (ref 70–99)
Potassium: 3.6 mmol/L (ref 3.5–5.1)
Sodium: 136 mmol/L (ref 135–145)

## 2023-08-27 MED ORDER — ENOXAPARIN SODIUM 40 MG/0.4ML IJ SOSY: 40.0000 mg | PREFILLED_SYRINGE | INTRAMUSCULAR | Status: DC

## 2023-08-27 MED ORDER — EZETIMIBE 10 MG PO TABS
10.0000 mg | ORAL_TABLET | Freq: Every day | ORAL | 0 refills | Status: DC
  Filled 2023-08-27: qty 30, 30d supply, fill #0

## 2023-08-27 MED ORDER — METOPROLOL SUCCINATE ER 25 MG PO TB24
25.0000 mg | ORAL_TABLET | Freq: Once | ORAL | Status: AC
Start: 2023-08-27 — End: 2023-08-27
  Administered 2023-08-27: 25 mg via ORAL
  Filled 2023-08-27: qty 1

## 2023-08-27 MED ORDER — METOPROLOL SUCCINATE ER 50 MG PO TB24
50.0000 mg | ORAL_TABLET | Freq: Every day | ORAL | 0 refills | Status: DC
  Filled 2023-08-27: qty 30, 30d supply, fill #0

## 2023-08-27 MED ORDER — POTASSIUM CHLORIDE CRYS ER 20 MEQ PO TBCR
20.0000 meq | EXTENDED_RELEASE_TABLET | Freq: Once | ORAL | Status: AC
  Administered 2023-08-27: 20 meq via ORAL
  Filled 2023-08-27: qty 1

## 2023-08-27 MED ORDER — METOPROLOL SUCCINATE ER 50 MG PO TB24: 50.0000 mg | ORAL_TABLET | Freq: Every day | ORAL | Status: DC

## 2023-08-27 MED ORDER — EZETIMIBE 10 MG PO TABS
10.0000 mg | ORAL_TABLET | Freq: Every day | ORAL | 11 refills | Status: DC
  Filled 2023-08-27: qty 30, 30d supply, fill #0

## 2023-08-27 NOTE — Progress Notes (Addendum)
   Patient Name: Dorothy Lee Date of Encounter: 08/27/2023 [facility] Cardiologist: Nona Dell, MD   Interval Summary  .    Feels well, ready to go home  Vital Signs .    Vitals:   08/26/23 2318 08/27/23 0156 08/27/23 0532 08/27/23 0715  BP: 124/60 (!) 170/85 (!) 146/72 (!) 148/80  Pulse:  66 69 71  Resp:  18 18 17   Temp:  (!) 97.1 F (36.2 C) 98 F (36.7 C) 98.1 F (36.7 C)  TempSrc:  Oral Oral Oral  SpO2:  95% 98% 95%  Weight:   58 kg   Height:        Intake/Output Summary (Last 24 hours) at 08/27/2023 0957 Last data filed at 08/26/2023 1539 Gross per 24 hour  Intake 226.59 ml  Output --  Net 226.59 ml      08/27/2023    5:32 AM 08/26/2023    5:02 AM 08/25/2023    4:44 PM  Last 3 Weights  Weight (lbs) 127 lb 13.9 oz 129 lb 3 oz 129 lb 10.1 oz  Weight (kg) 58 kg 58.6 kg 58.8 kg      Telemetry/ECG    Sinus Rhythm - Personally Reviewed  Physical Exam .   GEN: No acute distress.   Neck: No JVD Cardiac: RRR, no murmurs, rubs, or gallops.  Respiratory: Clear to auscultation bilaterally. GI: Soft, nontender, non-distended  MS: No edema Skin: right radial cath site stable.   Assessment & Plan .     82 y.o. female with a hx of coronary artery disease (s/p DES to RCA in 2009, DES to mid-LAD in 2014, s/p cath in 08/2021 showing ISR of LAD and RCA stents which were treated with cutting balloon angioplasty), hypertension, hyperlipidemia, GERD, breast cancer who was seen 08/25/2023 for the evaluation of chest pain at the request of hospitalist.    Unstable Angina CAD s/p prior DES to LAD, RCA -- presented with centralized chest pain with radiation into the jaw. hsTn 12>>12. Symptoms very similar to what she has experienced with prior anginal episodes.  Underwent cardiac catheterization 11/18 with two-vessel CAD with 30% in-stent restenosis of proximal/mid LAD as well as sequential 60 to 70% proximal and 50% mid RCA stenosis.  Recommendations for  escalation of antianginal/blood pressure therapy.  Her metoprolol succinate was increased to 25 mg twice daily with continuation of Imdur. Consolidate Toprol to 50mg  daily -- continue on ASA, plavix, Crestor 10mg  daily   HTN -- elevated  -- increase Toprol to 50mg  daily, chlorthalidone 12.5mg  daily    HLD -- on Crestor 10mg  daily, intolerant to higher doses   Hx of breast CA   Ok to DC. Follow up arranged in the [place] office.   For questions or updates, please contact [facility] Please consult [email or web address] for contact info under        Signed, Laverda Page, NP   I have personally seen and examined this patient. I agree with the assessment and plan as outlined above.  No chest pain this am. Plans for medical management of moderate in stent restenosis in the RCA. Toprol has been increased. Continue Imdur.  OK to d/c home. We will arrange follow up in our office.   Verne Carrow, MD, [facility] 08/27/2023 10:44 AM

## 2023-08-27 NOTE — TOC Transition Note (Signed)
Transition of Care [facility]) - CM/SW Discharge Note   Patient Details  Name: Dorothy Lee MRN: [id number] Date of Birth: Feb 06, 1941  Transition of Care [facility]) CM/SW Contact:  Leone Haven, RN Phone Number: 08/27/2023, 11:15 AM   Clinical Narrative:    For dc today, has no needs.  Has transport.         Patient Goals and CMS Choice      Discharge Placement                         Discharge Plan and Services Additional resources added to the After Visit Summary for                                       Social Determinants of Health (SDOH) Interventions SDOH Screenings   Food Insecurity: No Food Insecurity (08/25/2023)  Housing: Low Risk  (08/25/2023)  Transportation Needs: No Transportation Needs (08/25/2023)  Utilities: Not At Risk (08/25/2023)  Alcohol Screen: Low Risk  (06/13/2023)  Depression (PHQ2-9): Low Risk  (06/13/2023)  Financial Resource Strain: Low Risk  (06/13/2023)  Physical Activity: Inactive (06/13/2023)  Social Connections: Patient Declined (06/13/2023)  Stress: No Stress Concern Present (06/13/2023)  Tobacco Use: Low Risk  (08/24/2023)  Health Literacy: Adequate Health Literacy (06/13/2023)     Readmission Risk Interventions     No data to display

## 2023-08-27 NOTE — Progress Notes (Signed)
Pt has orders to be discharged. Discharge instructions given and pt has no additional questions at this time. Medication regimen reviewed and pt educated. Pt verbalized understanding and has no additional questions. Telemetry box removed. IV removed and site in good condition. TOC meds in process.

## 2023-08-27 NOTE — Discharge Summary (Signed)
Physician Discharge Summary  DEAUDRA MCFAIL VOZ:[id number] DOB: 04-Mar-1941 DOA: 08/24/2023  PCP: Aliene Beams, MD  Admit date: 08/24/2023 Discharge date: 08/27/2023 Recommendations for Outpatient Follow-up:  Follow up with PCP in 1 weeks-call for appointment Please obtain BMP/CBC in one week  Discharge Dispo: Home Discharge Condition: Stable Code Status:   Code Status: Full Code Diet recommendation:  Diet Order             Diet Heart Room service appropriate? Yes; Fluid consistency: Thin  Diet effective now                    Brief/Interim Summary: 58 yof w/ CAD s/p DES to RCA in 2009, DES to mid-LAD in 2014, s/p cath in 08/2021 showing ISR of LAD and RCA stents which were treated with cutting balloon angioplasty), hypertension, hyperlipidemia, GERD, breast cancer presenting to the ED for substernal chest pain.  Seen in the ED, vitals stable labs with troponin negative x 2 EKG sinus rhythm right bundle branch block chest x-ray unremarkable, cardiology was consulted and advised transfer to [facility] for unstable angina for ischemic workup Patient transferred to [facility] 11/17 11/18: Cardiac cath showed two-vessel CAD with 30% in-stent restenosis of the proximal/mid LAD and sequential 60 to 70% proximal and 50% mid RCA in-stent restenosis, LVEF more than 65%>> advised escalation of antianginal and blood pressure therapy and if recurrent chest pain consider functional assessment and/or PCI to in-stent restenosis of RCA and aggressive secondary prevention.  Metoprolol increased to 25 twice daily, continued on Imdur.   Discharge Diagnoses:  Principal Problem:   Chest pain Active Problems:   Essential hypertension   Allergic rhinitis   CAD S/P percutaneous coronary angioplasty   Mixed hyperlipidemia   Glaucoma Unstable angina History of CAD status post multiple PCI to RCA and LAD: Troponin negative so far.  Follow-up echocardiogram, patient is high risk for obstructive CAD,  cardiology following Continue aspirin, Plavix, Imdur, metoprolol/statin.  Holding off on heparin but low threshold to restart if has recurrent pain   Hypertension: Well-controlled.  Continue chlorthalidone, Imdur, metoprolol   Mixed HLD: Resume Crestor no longer taking Zetia   Hypokalemia: Replaced   CKD 3a: At baseline creatinine 1.0\   Consults: cardio Subjective: Aaox3, no chest pain  Discharge Exam: Vitals:   08/27/23 0532 08/27/23 0715  BP: (!) 146/72 (!) 148/80  Pulse: 69 71  Resp: 18 17  Temp: 98 F (36.7 C) 98.1 F (36.7 C)  SpO2: 98% 95%   General: Pt is alert, awake, not in acute distress Cardiovascular: RRR, S1/S2 +, no rubs, no gallops Respiratory: CTA bilaterally, no wheezing, no rhonchi Abdominal: Soft, NT, ND, bowel sounds + Extremities: no edema, no cyanosis  Discharge Instructions  Discharge Instructions     AMB Referral to Advanced Lipid Disorders Clinic   Complete by: As directed    Internal Lipid Clinic Referral Scheduling  Internal lipid clinic referrals are providers within [facility], who wish to refer established patients for routine management (help in starting PCSK9 inhibitor therapy) or advanced therapies.  Internal MD referral criteria:              1. All patients with LDL>190 mg/dL  2. All patients with Triglycerides >500 mg/dL  3. Patients with suspected or confirmed heterozygous familial hyperlipidemia (HeFH) or homozygous familial hyperlipidemia (HoFH)  4. Patients with family history of suspicious for genetic dyslipidemia desiring genetic testing  5. Patients refractory to standard guideline based therapy  6. Patients with  statin intolerance (failed 2 statins, one of which must be a high potency statin)  7. Patients who the provider desires to be seen by MD   Internal PharmD referral criteria:   1. Follow-up patients for medication management  2. Follow-up for compliance monitoring  3. Patients for drug education  4. Patients  with statin intolerance  5. PCSK9 inhibitor education and prior authorization approvals  6. Patients with triglycerides <500 mg/dL  External Lipid Clinic Referral  External lipid clinic referrals are for providers outside of [facility], considered new clinic patients - automatically routed to MD schedule   Discharge instructions   Complete by: As directed    Please call call MD or return to ER for similar or worsening recurring problem that brought you to hospital or if any fever,nausea/vomiting,abdominal pain, uncontrolled pain, chest pain,  shortness of breath or any other alarming symptoms.  Please follow-up your doctor as instructed in a week time and call the office for appointment.  Please avoid alcohol, smoking, or any other illicit substance and maintain healthy habits including taking your regular medications as prescribed.  You were cared for by a hospitalist during your hospital stay. If you have any questions about your discharge medications or the care you received while you were in the hospital after you are discharged, you can call the unit and ask to speak with the hospitalist on call if the hospitalist that took care of you is not available.  Once you are discharged, your primary care physician will handle any further medical issues. Please note that NO REFILLS for any discharge medications will be authorized once you are discharged, as it is imperative that you return to your primary care physician (or establish a relationship with a primary care physician if you do not have one) for your aftercare needs so that they can reassess your need for medications and monitor your lab values   Increase activity slowly   Complete by: As directed       Allergies as of 08/27/2023       Reactions   Crestor [rosuvastatin]    Crestor 20 mg caused muscle aches - pt is able to tolerate lower doses (takes 10mg )   Lipitor [atorvastatin]    Atorvastatin 40 mg caused fatigue    Lisinopril Cough   Simvastatin    Muscle aches        Medication List     TAKE these medications    aspirin EC 81 MG tablet Take 81 mg by mouth daily.   BENEFIBER PO Take 1 Dose by mouth daily.   chlorthalidone 25 MG tablet Commonly known as: HYGROTON Take 0.5 tablets (12.5 mg total) by mouth daily.   clopidogrel 75 MG tablet Commonly known as: PLAVIX Take 1 tablet (75 mg total) by mouth daily.   DULoxetine 30 MG capsule Commonly known as: CYMBALTA Take 30 mg by mouth daily.   ezetimibe 10 MG tablet Commonly known as: Zetia Take 1 tablet (10 mg total) by mouth daily.   isosorbide mononitrate 60 MG 24 hr tablet Commonly known as: IMDUR Take 1 tablet (60 mg total) by mouth daily.   latanoprost 0.005 % ophthalmic solution Commonly known as: XALATAN Place 1 drop into both eyes at bedtime.   metoprolol succinate 50 MG 24 hr tablet Commonly known as: TOPROL-XL Take 1 tablet (50 mg total) by mouth daily. Take with or immediately following a meal. Start taking on: August 28, 2023 What changed:  medication strength how much to  take additional instructions   montelukast 10 MG tablet Commonly known as: SINGULAIR Take 10 mg by mouth daily.   nitroGLYCERIN 0.4 MG SL tablet Commonly known as: NITROSTAT Place 1 tablet (0.4 mg total) under the tongue every 5 (five) minutes x 3 doses as needed for chest pain. May repeat x3   rosuvastatin 10 MG tablet Commonly known as: CRESTOR Take 10 mg by mouth daily.   VITAMIN B-12 CR PO Take by mouth.        Follow-up Information     Aliene Beams, MD Follow up in 1 week(s).   Specialty: Family Medicine Contact information: [phone] [place] [address] [phone]                Allergies  Allergen Reactions   Crestor [Rosuvastatin]     Crestor 20 mg caused muscle aches - pt is able to tolerate lower doses (takes 10mg )   Lipitor [Atorvastatin]     Atorvastatin 40 mg caused  fatigue   Lisinopril Cough   Simvastatin     Muscle aches    The results of significant diagnostics from this hospitalization (including imaging, microbiology, ancillary and laboratory) are listed below for reference.    Microbiology: No results found for this or any previous visit (from the past 240 hour(s)).  Procedures/Studies: CARDIAC CATHETERIZATION  Result Date: 08/26/2023 Conclusions: Two-vessel coronary artery disease with 30% in-stent restenosis of the proximal/mid LAD as well as sequential 60-70% proximal and 50% mid RCA in-stent restenoses.  No angiographically significant disease noted in the LCx. Hyperdynamic left ventricular systolic function (LVEF > 65%) with normal filling pressure (LVEDP 10 mmHg). Tortuous right radial artery with significant vasospasm.  Consider alternative access for future catheterizations. Recommendations: Favor escalation of antianginal and blood pressure therapy.  I will double metoprolol succinate to 25 mg twice daily and continue current dose of isosorbide mononitrate. If the patient has recurrent chest pain, consider function assessment and/or PCI to in-stent restenosis of RCA.  Would recommend femoral approach due to challenging right radial access. Aggressive secondary prevention of coronary artery disease. Yvonne Kendall, MD [facility]  DG Chest Port 1 View  Result Date: 08/24/2023 CLINICAL DATA:  Chest pain. EXAM: PORTABLE CHEST 1 VIEW COMPARISON:  May 06, 2021 FINDINGS: Calcific atherosclerotic disease of the aorta. Cardiomediastinal silhouette is normal. Mediastinal contours appear intact. There is no evidence of focal airspace consolidation, pleural effusion or pneumothorax. Osseous structures are without acute abnormality. Soft tissues are grossly normal. Post surgical changes in the right thorax. IMPRESSION: No active disease. Electronically Signed   By: Ted Mcalpine M.D.   On: 08/24/2023 16:36    Labs: BNP (last 3 results) No  results for input(s): "BNP" in the last 8760 hours. Basic Metabolic Panel: Recent Labs  Lab 08/24/23 1601 08/25/23 0507 08/27/23 0421  NA 140 138 136  K 3.5 3.4* 3.6  CL 102 100 107  CO2 29 27 19*  GLUCOSE 107* 90 76  BUN 22 18 17   CREATININE 1.14* 1.04* 0.88  CALCIUM 8.8* 8.5* 8.4*  MG  --  2.2  --   PHOS  --  3.2  --    Liver Function Tests: Recent Labs  Lab 08/25/23 0507  AST 20  ALT 18  ALKPHOS 46  BILITOT 1.2*  PROT 6.0*  ALBUMIN 3.3*   No results for input(s): "LIPASE", "AMYLASE" in the last 168 hours. No results for input(s): "AMMONIA" in the last 168 hours. CBC: Recent Labs  Lab  08/24/23 1601 08/25/23 0507 08/27/23 0421  WBC 7.3 6.8 6.5  HGB 14.2 12.9 13.1  HCT 43.0 40.9 40.1  MCV 93.5 92.5 91.6  PLT 231 203 202   Cardiac Enzymes: No results for input(s): "CKTOTAL", "CKMB", "CKMBINDEX", "TROPONINI" in the last 168 hours. BNP: Invalid input(s): "POCBNP" CBG: No results for input(s): "GLUCAP" in the last 168 hours. D-Dimer No results for input(s): "DDIMER" in the last 72 hours. Hgb A1c No results for input(s): "HGBA1C" in the last 72 hours. Lipid Profile No results for input(s): "CHOL", "HDL", "LDLCALC", "TRIG", "CHOLHDL", "LDLDIRECT" in the last 72 hours. Thyroid function studies No results for input(s): "TSH", "T4TOTAL", "T3FREE", "THYROIDAB" in the last 72 hours.  Invalid input(s): "FREET3" Anemia work up No results for input(s): "VITAMINB12", "FOLATE", "FERRITIN", "TIBC", "IRON", "RETICCTPCT" in the last 72 hours. Urinalysis    Component Value Date/Time   COLORURINE YELLOW 07/17/2022 1157   APPEARANCEUR Clear 06/13/2023 1530   LABSPEC 1.017 07/17/2022 1157   PHURINE 5.5 07/17/2022 1157   GLUCOSEU Negative 06/13/2023 1530   HGBUR NEGATIVE 07/17/2022 1157   BILIRUBINUR Negative 06/13/2023 1530   KETONESUR 15 (A) 07/17/2022 1157   PROTEINUR Negative 06/13/2023 1530   PROTEINUR NEGATIVE 07/17/2022 1157   UROBILINOGEN 0.2 07/25/2011 1454    NITRITE Negative 06/13/2023 1530   NITRITE NEGATIVE 07/17/2022 1157   LEUKOCYTESUR Negative 06/13/2023 1530   LEUKOCYTESUR NEGATIVE 07/17/2022 1157   Sepsis Labs Recent Labs  Lab 08/24/23 1601 08/25/23 0507 08/27/23 0421  WBC 7.3 6.8 6.5   Microbiology No results found for this or any previous visit (from the past 240 hour(s)).   Time coordinating discharge: 25 minutes  SIGNED: Lanae Boast, MD  [facility] 08/27/2023, 10:30 AM  If 7PM-7AM, please contact night-coverage [email or web address]

## 2023-08-28 LAB — LIPOPROTEIN A (LPA): Lipoprotein (a): 382.5 nmol/L — ABNORMAL HIGH (ref <75.0)

## 2023-09-11 ENCOUNTER — Ambulatory Visit
Admission: RE | Admit: 2023-09-11 | Discharge: 2023-09-11 | Disposition: A | Discharge status: Home / Self Care | Payer: Medicare Other | Source: Ambulatory Visit | Attending: Diagnostic Neuroimaging | Admitting: Diagnostic Neuroimaging

## 2023-09-11 DIAGNOSIS — R531 Weakness: Secondary | ICD-10-CM | POA: Diagnosis not present

## 2023-09-11 DIAGNOSIS — R42 Dizziness and giddiness: Secondary | ICD-10-CM

## 2023-09-11 DIAGNOSIS — R270 Ataxia, unspecified: Secondary | ICD-10-CM

## 2023-09-11 MED ORDER — GADOPICLENOL 0.5 MMOL/ML IV SOLN
6.0000 mL | Freq: Once | INTRAVENOUS | Status: AC | PRN
  Administered 2023-09-11: 6 mL via INTRAVENOUS

## 2023-09-20 ENCOUNTER — Other Ambulatory Visit: Payer: Self-pay | Admitting: Diagnostic Neuroimaging

## 2023-09-20 DIAGNOSIS — I639 Cerebral infarction, unspecified: Secondary | ICD-10-CM

## 2023-09-23 ENCOUNTER — Telehealth: Payer: Self-pay | Admitting: Cardiology

## 2023-09-23 MED ORDER — METOPROLOL SUCCINATE ER 50 MG PO TB24: 50.0000 mg | ORAL_TABLET | Freq: Every day | ORAL | 11 refills | Status: DC

## 2023-09-23 NOTE — Telephone Encounter (Signed)
Refill complete 

## 2023-09-23 NOTE — Telephone Encounter (Signed)
*  STAT* If patient is at the pharmacy, call can be transferred to refill team.   1. Which medications need to be refilled? (please list name of each medication and dose if known)   metoprolol succinate (TOPROL-XL) 50 MG 24 hr tablet      4. Which pharmacy/location (including street and city if local pharmacy) is medication to be sent to?[facility] [phone] - [place], [place] - [address] AT [facility] OF [address]    5. Do they need a 30 day or 90 day supply? 90

## 2023-10-11 ENCOUNTER — Ambulatory Visit: Payer: Medicare Other

## 2023-10-16 ENCOUNTER — Ambulatory Visit: Payer: Medicare Other | Attending: Cardiology | Admitting: Cardiology

## 2023-10-16 ENCOUNTER — Encounter: Payer: Self-pay | Admitting: Cardiology

## 2023-10-16 VITALS — BP 148/80 | HR 87 | Ht 62.0 in | Wt 135.0 lb

## 2023-10-16 DIAGNOSIS — E782 Mixed hyperlipidemia: Secondary | ICD-10-CM | POA: Insufficient documentation

## 2023-10-16 DIAGNOSIS — I10 Essential (primary) hypertension: Secondary | ICD-10-CM | POA: Insufficient documentation

## 2023-10-16 DIAGNOSIS — I25119 Atherosclerotic heart disease of native coronary artery with unspecified angina pectoris: Secondary | ICD-10-CM | POA: Diagnosis not present

## 2023-10-16 DIAGNOSIS — Z79899 Other long term (current) drug therapy: Secondary | ICD-10-CM | POA: Insufficient documentation

## 2023-10-16 MED ORDER — METOPROLOL SUCCINATE ER 50 MG PO TB24: 75.0000 mg | ORAL_TABLET | Freq: Every day | ORAL | 11 refills | Status: DC

## 2023-10-16 NOTE — Patient Instructions (Addendum)
 Medication Instructions:  Your physician has recommended you make the following change in your medication:  Increase metoprolol  succinate to 75 mg daily Continue all other medications as prescribed  Labwork: Your physician recommends that you return for a FASTING lipid profile as soon as possible. Please do not eat or drink for at least 8 hours when you have this done. You may take your medications that morning with a sip of water .  [facility] ([address]. [place])  Testing/Procedures: none  Follow-Up: Your physician recommends that you schedule a follow-up appointment in: 3 months  Any Other Special Instructions Will Be Listed Below (If Applicable).  If you need a refill on your cardiac medications before your next appointment, please call your pharmacy.

## 2023-10-16 NOTE — Progress Notes (Signed)
 Cardiology Office Note  Date: 10/16/2023   ID: Dorothy Lee, DOB 1941/09/20, MRN [id number]  History of Present Illness: Dorothy Lee is an 83 y.o. female last seen in October 2024.  She is here for a follow-up visit.  Reports occasional mild chest discomfort, sometimes in the evenings and not necessarily exertional.  She has not had to use any nitroglycerin  recently.  Otherwise NYHA class II dyspnea, no palpitations or syncope.  I reviewed her medications.  Current cardiovascular regimen includes aspirin , chlorthalidone , Zetia , Imdur , Toprol -XL, Crestor , and as needed nitroglycerin .  She has not had a recent follow-up lipid panel.  Reviewed her cardiac catheterization report from November 2024 as noted below.  Physical Exam: VS:  BP (!) 148/80 (BP Location: Right Arm)   Pulse 87   Ht 5' 2 (1.575 m)   Wt 135 lb (61.2 kg)   SpO2 97%   BMI 24.69 kg/m , BMI Body mass index is 24.69 kg/m.  Wt Readings from Last 3 Encounters:  10/16/23 135 lb (61.2 kg)  08/27/23 127 lb 13.9 oz (58 kg)  08/20/23 131 lb 3.2 oz (59.5 kg)    General: Patient appears comfortable at rest. HEENT: Conjunctiva and lids normal. Neck: Supple, no elevated JVP or carotid bruits. Lungs: Clear to auscultation, nonlabored breathing at rest. Cardiac: Regular rate and rhythm, no S3 or significant systolic murmur. Extremities: No pitting edema.  ECG:  An ECG dated 08/24/2023 was personally reviewed today and demonstrated:  Sinus rhythm with right bundle branch block and left anterior fascicular block.  Labwork: 08/25/2023: ALT 18; AST 20; Magnesium 2.2 08/27/2023: BUN 17; Creatinine, Ser 0.88; Hemoglobin 13.1; Platelets 202; Potassium 3.6; Sodium 136     Component Value Date/Time   CHOL 177 08/01/2023 1207   TRIG 146 08/01/2023 1207   TRIG 119 05/16/2010 0000   HDL 42 08/01/2023 1207   CHOLHDL 4.2 08/01/2023 1207   VLDL 29 08/01/2023 1207   LDLCALC 106 (H) 08/01/2023 1207   LDLCALC 99 05/05/2019 1124    LDLDIRECT 144.4 11/09/2013 0934   Other Studies Reviewed Today:  Cardiac catheterization 08/26/2023: Conclusions: Two-vessel coronary artery disease with 30% in-stent restenosis of the proximal/mid LAD as well as sequential 60-70% proximal and 50% mid RCA in-stent restenoses.  No angiographically significant disease noted in the LCx. Hyperdynamic left ventricular systolic function (LVEF > 65%) with normal filling pressure (LVEDP 10 mmHg). Tortuous right radial artery with significant vasospasm.  Consider alternative access for future catheterizations.   Recommendations: Favor escalation of antianginal and blood pressure therapy.  I will double metoprolol  succinate to 25 mg twice daily and continue current dose of isosorbide  mononitrate. If the patient has recurrent chest pain, consider function assessment and/or PCI to in-stent restenosis of RCA.  Would recommend femoral approach due to challenging right radial access. Aggressive secondary prevention of coronary artery disease.  Assessment and Plan:  1.  CAD status post DES to the RCA in 2009, DES to the mid LAD in 2014, and Cutting Balloon angioplasty due to in-stent restenosis of the LAD and RCA in November 2022.  She underwent follow-up cardiac catheterization in November 2024 with results outlined above and recommendation to continue antianginal medical therapy.  She did have 60 to 70% proximal and 50% mid RCA in-stent restenosis which could potentially be addressed percutaneously depending on symptom control.  At this point not reporting progressive angina with relatively mild symptoms.  Plan to continue aspirin , Zetia , Crestor , Imdur , and uptitrate Toprol -XL to 75 mg daily  for now.  2.  Primary hypertension.  Blood pressure elevated today.  Increasing Toprol -XL, could go up on chlorthalidone  next.   3.  Mixed hyperlipidemia.  LDL 106 in October 2024.  Recheck FLP on combination of Zetia  and Crestor .  Disposition:  Follow up  3  months.  Signed, Jayson JUDITHANN Sierras, M.D., F.A.C.C. [facility] at [facility]

## 2023-10-19 LAB — LIPID PANEL
Chol/HDL Ratio: 3.7 {ratio} (ref 0.0–4.4)
Cholesterol, Total: 171 mg/dL (ref 100–199)
HDL: 46 mg/dL (ref >39)
LDL Chol Calc (NIH): 104 mg/dL — ABNORMAL HIGH (ref 0–99)
Triglycerides: 116 mg/dL (ref 0–149)
VLDL Cholesterol Cal: 21 mg/dL (ref 5–40)

## 2023-10-23 ENCOUNTER — Encounter: Payer: Self-pay | Admitting: Obstetrics & Gynecology

## 2023-10-23 ENCOUNTER — Ambulatory Visit: Payer: Medicare Other | Admitting: Obstetrics & Gynecology

## 2023-10-23 VITALS — BP 170/68 | HR 84 | Ht 62.0 in | Wt 134.2 lb

## 2023-10-23 DIAGNOSIS — N3941 Urge incontinence: Secondary | ICD-10-CM

## 2023-10-23 DIAGNOSIS — N3281 Overactive bladder: Secondary | ICD-10-CM

## 2023-10-23 NOTE — Progress Notes (Signed)
 GYN VISIT Patient name: Dorothy Lee MRN [id number]  Date of birth: 1941/05/19 Chief Complaint:   Routine Prenatal Visit  History of Present Illness:   Dorothy Lee is a 83 y.o. G2P1010 PM female being seen today for follow up regarding:  OAB:  First medication-Gemtesa  was very expensive ($400). The 2nd medication caused significant dryness and was unable to take this medication.    In review, her symptoms have not changed- wearing a pad daily and must change several times per day.  Notes urinary urgency and urge incontinence.  Up at least 2x per night.  Denies stress incontinence.  Denies vaginal bleeding, discharge or other acute gyn concerns.    No LMP recorded. Patient is postmenopausal.    Review of Systems:   Pertinent items are noted in HPI Denies fever/chills, dizziness, headaches, visual disturbances, fatigue, shortness of breath, chest pain, abdominal pain, vomiting.  Pertinent History Reviewed:   Past Surgical History:  Procedure Laterality Date   APPENDECTOMY  1981   BREAST BIOPSY  1984   BREAST LUMPECTOMY  1982   right breast   COLONOSCOPY  2008   Dr. Denece Finger: diverticulosis, hemorrhoids, conscious sedation   COLONOSCOPY N/A 01/30/2016   Dr. Riley Cheadle: mild divertiuculosis in sigmoid colon. narrowing of the colon in association with the deverticular opening. 6 polyps removed, tubular adenoma. next TCS in 3 years   COLONOSCOPY WITH PROPOFOL  N/A 04/17/2021   one 4 mm polyp at splenic flexure, otherwise normal. Tubular adenoma.   CORONARY ANGIOPLASTY WITH STENT PLACEMENT  02/2008   CORONARY BALLOON ANGIOPLASTY N/A 08/17/2021   Procedure: CORONARY BALLOON ANGIOPLASTY;  Surgeon: Millicent Ally, MD;  Location: [facility] INVASIVE CV LAB;  Service: Cardiovascular;  Laterality: N/A;   ESOPHAGOGASTRODUODENOSCOPY  2014   Dr. Sandrea Cruel: erosive gastritis, no h.pylori   ESOPHAGOGASTRODUODENOSCOPY N/A 01/30/2016   Dr. Riley Cheadle: normal esophagus s/p dilation, small hh.    ESOPHAGOGASTRODUODENOSCOPY (EGD) WITH PROPOFOL  N/A 04/17/2021   mild Schatzki ring, s/p dilation. Small hiatal hernia. Normal duodenum.   LEFT HEART CATH AND CORONARY ANGIOGRAPHY N/A 08/17/2021   Procedure: LEFT HEART CATH AND CORONARY ANGIOGRAPHY;  Surgeon: Millicent Ally, MD;  Location: [facility] INVASIVE CV LAB;  Service: Cardiovascular;  Laterality: N/A;   LEFT HEART CATH AND CORONARY ANGIOGRAPHY N/A 08/26/2023   Procedure: LEFT HEART CATH AND CORONARY ANGIOGRAPHY;  Surgeon: Sammy Crisp, MD;  Location: [facility] CV LAB;  Service: Cardiovascular;  Laterality: N/A;   LEFT HEART CATHETERIZATION WITH CORONARY ANGIOGRAM N/A 12/02/2012   Procedure: LEFT HEART CATHETERIZATION WITH CORONARY ANGIOGRAM;  Surgeon: Lucendia Rusk, MD;  Location: [facility] CATH LAB;  Service: Cardiovascular;  Laterality: N/A;   MALONEY DILATION N/A 01/30/2016   Procedure: Londa Rival DILATION;  Surgeon: Suzette Espy, MD;  Location: [facility] ENDO SUITE;  Service: Endoscopy;  Laterality: N/A;   MALONEY DILATION N/A 04/17/2021   Procedure: Londa Rival DILATION;  Surgeon: Suzette Espy, MD;  Location: [facility] ENDO SUITE;  Service: Endoscopy;  Laterality: N/A;   NASAL SINUS SURGERY     PARTIAL COLECTOMY N/A 05/19/2018   Procedure: PARTIAL COLECTOMY;  Surgeon: Alanda Allegra, MD;  Location: [facility] ORS;  Service: General;  Laterality: N/A;   PERCUTANEOUS CORONARY STENT INTERVENTION (PCI-S)  12/02/2012   Procedure: PERCUTANEOUS CORONARY STENT INTERVENTION (PCI-S);  Surgeon: Lucendia Rusk, MD;  Location: [facility] CATH LAB;  Service: Cardiovascular;;  DES to the MID LAD   POLYPECTOMY  04/17/2021   Procedure: POLYPECTOMY;  Surgeon: Suzette Espy, MD;  Location: [facility];  Service: Endoscopy;;    Past Medical History:  Diagnosis Date   Allergic rhinitis    CAD (coronary artery disease)    a. s/p DES to RCA in 2009 b.  DES to mid-LAD in 2014 c. 08/2021: ISR of LAD and RCA stents treated with cutting balloon angioplasty   Cancer [facility])     Diverticulitis    Diverticulosis    Essential hypertension    GERD (gastroesophageal reflux disease)    Glaucoma    History of breast cancer    Right - s/p XRT 1984, surgery, no chemo   Hyperlipidemia    Personal history of radiation therapy    Rotator cuff syndrome, left    Scoliosis    Urinary incontinence    Reviewed problem list, medications and allergies. Physical Assessment:   Vitals:   10/23/23 1348  BP: (!) 170/68  Pulse: 84  Weight: 134 lb 3.2 oz (60.9 kg)  Height: 5\' 2"  (1.575 m)  Body mass index is 24.55 kg/m.       Physical Examination:   General appearance: alert, well appearing, and in no distress  Psych: mood appropriate, normal affect  Skin: warm & dry   Cardiovascular: normal heart rate noted  Respiratory: normal respiratory effort, no distress  Pelvic: examination not indicated  Extremities: no edema   Chaperone: N/A    Assessment & Plan:  1) OAB Unfortunately our options are limited due to insurance coverage Discussed trial of alternative anti-cholinergic- pt declined due to significant side effects Given samples of Gemtesa  and if she notes improvement will try and work on better coverage of prescription    Return in about 1 year (around 10/22/2024) for medication follow up.   Cuong Moorman, DO Attending Obstetrician & Gynecologist, [facility] for [employer], [facility]

## 2023-10-31 ENCOUNTER — Telehealth: Payer: Self-pay | Admitting: *Deleted

## 2023-10-31 DIAGNOSIS — E782 Mixed hyperlipidemia: Secondary | ICD-10-CM

## 2023-10-31 NOTE — Telephone Encounter (Signed)
Patient informed and verbalized understanding of plan. 

## 2023-10-31 NOTE — Telephone Encounter (Signed)
-----   Message from Nona Dell sent at 10/20/2023  3:24 PM EST ----- Results reviewed. LDL 104 on tolerated dose of Crestor and Zetia. Let's get a lipid clinic virtual consultation regarding other treatment options (also has elevated Lp(a).

## 2023-11-01 ENCOUNTER — Telehealth: Payer: Self-pay | Admitting: Cardiology

## 2023-11-01 NOTE — Telephone Encounter (Signed)
Husband Rayna Sexton) wants a call back to discuss patient's test results.

## 2023-11-01 NOTE — Telephone Encounter (Signed)
Patient's husband, Rayna Sexton, is calling back to get results.

## 2023-11-01 NOTE — Telephone Encounter (Signed)
-----  Message from Nona Dell sent at 10/20/2023  3:24 PM EST ----- Results reviewed. LDL 104 on tolerated dose of Crestor and Zetia. Let's get a lipid clinic virtual consultation regarding other treatment options (also has elevated Lp(a).

## 2023-11-01 NOTE — Telephone Encounter (Signed)
Husband, Tynisha Ogan informed and verbalized understanding.

## 2023-11-18 ENCOUNTER — Ambulatory Visit: Payer: Medicare Other | Admitting: Neurology

## 2023-12-09 ENCOUNTER — Telehealth: Payer: Self-pay | Admitting: *Deleted

## 2023-12-09 ENCOUNTER — Encounter: Payer: Self-pay | Admitting: *Deleted

## 2023-12-09 NOTE — Telephone Encounter (Signed)
  Procedure: egd/dilation  Height: 5'2 Weight: 130LBS     Are you diabetic?  NO  Do you have a prosthetic or mechanical heart valve? NO  Do you have a pacemaker/defibrillator?   NO  Have you had endocarditis/atrial fibrillation?  NO  Do you use supplemental oxygen/CPAP?  NO  Have you had joint replacement within the last 12 months?  NO   Do you have history of alcohol use? If yes, how much and how often.  NO  Do you have history or are you using drugs? If yes, what do are you  using?  NO  Have you ever had a stroke/heart attack?  HEART ATTACK 1.5 YRS AGO  Have you ever had a heart or other vascular stent placed? 3-4 HEART STENTS  Do you take weight loss medication?  NO  Do you take any blood-thinning medications such as: (Plavix, aspirin, Coumadin, Aggrenox, Brilinta, Xarelto, Eliquis, Pradaxa, Savaysa or Effient)? PLAVIX 75MG , ASPIRIN 81MG   If yes we need the name, milligram, dosage and who is prescribing doctor:               Current Outpatient Medications  Medication Sig Dispense Refill   aspirin EC 81 MG tablet Take 81 mg by mouth daily.      chlorthalidone (HYGROTON) 25 MG tablet Take 0.5 tablets (12.5 mg total) by mouth daily. 45 tablet 3   Cyanocobalamin (VITAMIN B-12 CR PO) Take by mouth.     ezetimibe (ZETIA) 10 MG tablet Take 1 tablet (10 mg total) by mouth daily. 30 tablet 11   fluticasone (FLONASE) 50 MCG/ACT nasal spray Place 1 spray into both nostrils daily as needed for allergies.     isosorbide mononitrate (IMDUR) 60 MG 24 hr tablet Take 1 tablet (60 mg total) by mouth daily. 90 tablet 3   latanoprost (XALATAN) 0.005 % ophthalmic solution Place 1 drop into both eyes at bedtime.      meclizine (ANTIVERT) 25 MG tablet Take 25 mg by mouth 3 (three) times daily as needed for dizziness.     metoprolol succinate (TOPROL-XL) 50 MG 24 hr tablet Take 1.5 tablets (75 mg total) by mouth daily. Take with or immediately following a meal. 45 tablet 11   montelukast  (SINGULAIR) 10 MG tablet Take 10 mg by mouth daily.     nitroGLYCERIN (NITROSTAT) 0.4 MG SL tablet Place 1 tablet (0.4 mg total) under the tongue every 5 (five) minutes x 3 doses as needed for chest pain. May repeat x3 25 tablet 3   pantoprazole (PROTONIX) 40 MG tablet Take 40 mg by mouth daily.     rosuvastatin (CRESTOR) 10 MG tablet Take 10 mg by mouth daily.     Wheat Dextrin (BENEFIBER PO) Take 1 Dose by mouth daily.     No current facility-administered medications for this visit.    Allergies  Allergen Reactions   Crestor [Rosuvastatin]     Crestor 20 mg caused muscle aches - pt is able to tolerate lower doses (takes 10mg )   Lipitor [Atorvastatin]     Atorvastatin 40 mg caused fatigue   Lisinopril Cough   Simvastatin     Muscle aches

## 2023-12-09 NOTE — Telephone Encounter (Signed)
 Pt aware of pre-op appt. Also mailed to her.

## 2023-12-09 NOTE — Telephone Encounter (Signed)
 Spoke with pt. Offered date this month but will be in [place]. She is scheduled for 4/23. Aware will call back with pre-op appointment and will send instructions to her. Also aware she will not need to stop her plavix or aspirin.

## 2023-12-11 ENCOUNTER — Ambulatory Visit: Payer: Medicare Other

## 2024-01-16 ENCOUNTER — Other Ambulatory Visit: Payer: Self-pay | Admitting: Student

## 2024-01-21 ENCOUNTER — Encounter: Payer: Self-pay | Admitting: Cardiology

## 2024-01-21 ENCOUNTER — Ambulatory Visit: Payer: Medicare Other | Attending: Cardiology | Admitting: Cardiology

## 2024-01-21 VITALS — BP 134/70 | HR 71 | Ht 62.0 in | Wt 131.0 lb

## 2024-01-21 DIAGNOSIS — E782 Mixed hyperlipidemia: Secondary | ICD-10-CM | POA: Diagnosis present

## 2024-01-21 DIAGNOSIS — I25119 Atherosclerotic heart disease of native coronary artery with unspecified angina pectoris: Secondary | ICD-10-CM | POA: Insufficient documentation

## 2024-01-21 DIAGNOSIS — I10 Essential (primary) hypertension: Secondary | ICD-10-CM | POA: Diagnosis present

## 2024-01-21 NOTE — Progress Notes (Signed)
    Cardiology Office Note  Date: 01/21/2024   ID: Dorothy Lee, DOB [birth year], MRN [id number]  History of Present Illness: Dorothy Lee is an 83 y.o. female last seen in January.  She is here for a routine visit.  States that she has used nitroglycerin on 2 occasions since last visit.  Otherwise stable NYHA class II dyspnea, no palpitations or syncope.  She is scheduled for an EGD in the near future.  Notes in the chart indicate plan to proceed on antiplatelet therapy.  We went over her medications.  We did discuss stopping aspirin, she reports easy bruising otherwise on Plavix.  She has a history of statin myalgias, tolerating Crestor 10 mg daily and Zetia 10 mg daily, although her LDL was only 104 at last check.  We have discussed referral to the lipid clinic.  She had an elevated LP(a) previously as well.  Physical Exam: VS:  BP 134/70   Pulse 71   Ht 5\' 2"  (1.575 m)   Wt 131 lb (59.4 kg)   SpO2 95%   BMI 23.96 kg/m , BMI Body mass index is 23.96 kg/m.  Wt Readings from Last 3 Encounters:  01/21/24 131 lb (59.4 kg)  10/23/23 134 lb 3.2 oz (60.9 kg)  10/16/23 135 lb (61.2 kg)    General: Patient appears comfortable at rest. HEENT: Conjunctiva and lids normal. Neck: Supple, no elevated JVP or carotid bruits. Lungs: Clear to auscultation, nonlabored breathing at rest. Cardiac: Regular rate and rhythm, no S3 or significant systolic murmur.  ECG:  An ECG dated 08/24/2023 was personally reviewed today and demonstrated:  Sinus rhythm with right bundle branch block and left anterior fascicular block.  Labwork: 08/25/2023: ALT 18; AST 20; Magnesium 2.2 08/27/2023: BUN 17; Creatinine, Ser 0.88; Hemoglobin 13.1; Platelets 202; Potassium 3.6; Sodium 136     Component Value Date/Time   CHOL 171 10/18/2023 1344   TRIG 116 10/18/2023 1344   TRIG 119 05/16/2010 0000   HDL 46 10/18/2023 1344   CHOLHDL 3.7 10/18/2023 1344   CHOLHDL 4.2 08/01/2023 1207   VLDL 29 08/01/2023 1207    LDLCALC 104 (H) 10/18/2023 1344   LDLCALC 99 05/05/2019 1124   LDLDIRECT 144.4 11/09/2013 0934   Other Studies Reviewed Today:  No interval cardiac testing for review today.  Assessment and Plan:  1.  CAD status post DES to the RCA in 2009, DES to the mid LAD in 2014, and Cutting Balloon angioplasty due to in-stent restenosis of the LAD and RCA in November 2022.  She underwent follow-up cardiac catheterization in November 2024 with recommendation to continue antianginal medical therapy.  She did have 60 to 70% proximal and 50% mid RCA in-stent restenosis.  Plan to stop aspirin at this time given easy bruising, otherwise continue Plavix 75 mg daily.  She has also on Imdur 60 mg daily, Crestor 10 mg daily, and Zetia 10 mg daily.   2.  Primary hypertension.  Continue Toprol XL 75 mg daily in addition to the above.   3.  Mixed hyperlipidemia.  She has a history of statin myalgias.  LDL 104 in January.  She is on Crestor 10 mg daily and Zetia 10 mg daily, maximum tolerated dose.  Also had elevated LP(a) of 382.  Making lipid clinic referral if she is agreeable.  Disposition:  Follow up  6 months.  Signed, Jonelle Sidle, M.D., F.A.C.C. [facility] at [facility]

## 2024-01-21 NOTE — Patient Instructions (Signed)
 Medication Instructions:   STOP Aspirin  Labwork: None today  Testing/Procedures: None today  Follow-Up: 6 months with Dr.McDowell  Any Other Special Instructions Will Be Listed Below (If Applicable).   You have been referred to Lipid management clinic in [place]. They will call you to schedule an appointment.   If you need a refill on your cardiac medications before your next appointment, please call your pharmacy.

## 2024-01-23 ENCOUNTER — Telehealth: Payer: Self-pay | Admitting: *Deleted

## 2024-01-23 NOTE — Telephone Encounter (Signed)
 Pt cancelling procedure for 4/23 with Dr. Riley Cheadle. She had an emergency and is going out own. Will reschedule once she knows she is back in town.

## 2024-01-28 ENCOUNTER — Encounter (HOSPITAL_COMMUNITY)

## 2024-01-29 ENCOUNTER — Ambulatory Visit (HOSPITAL_COMMUNITY): Admit: 2024-01-29 | Admitting: Internal Medicine

## 2024-01-29 ENCOUNTER — Encounter (HOSPITAL_COMMUNITY): Payer: Self-pay

## 2024-01-29 SURGERY — ESOPHAGOGASTRODUODENOSCOPY (EGD) WITH PROPOFOL
Anesthesia: Choice

## 2024-01-30 ENCOUNTER — Ambulatory Visit: Payer: Self-pay | Admitting: Internal Medicine

## 2024-01-30 ENCOUNTER — Ambulatory Visit: Admitting: Obstetrics & Gynecology

## 2024-02-19 ENCOUNTER — Encounter: Payer: Self-pay | Admitting: Obstetrics & Gynecology

## 2024-02-19 ENCOUNTER — Ambulatory Visit: Admitting: Obstetrics & Gynecology

## 2024-02-19 VITALS — BP 155/82 | HR 64 | Ht 62.0 in | Wt 131.6 lb

## 2024-02-19 DIAGNOSIS — N3281 Overactive bladder: Secondary | ICD-10-CM | POA: Diagnosis not present

## 2024-02-19 DIAGNOSIS — N3941 Urge incontinence: Secondary | ICD-10-CM

## 2024-02-19 DIAGNOSIS — I10 Essential (primary) hypertension: Secondary | ICD-10-CM | POA: Diagnosis not present

## 2024-02-19 MED ORDER — MIRABEGRON ER 50 MG PO TB24: 50.0000 mg | ORAL_TABLET | Freq: Every day | ORAL | 4 refills | Status: AC

## 2024-02-19 NOTE — Progress Notes (Signed)
 GYN VISIT Patient name: Dorothy Lee MRN [id number]  Date of birth: Aug 16, 1941 Chief Complaint:   No chief complaint on file.  History of Present Illness:   Dorothy Lee is a 83 y.o. G2P1010 PM female being seen today for follow up regarding:  OAB: This has been an ongoing issue notes urinary urgency and incontinence.  +nocturia.   Wearing a pad daily that she changes a few times per day.  Notes that her symptoms continue to be an ongoing problem for her  The issue has been insurance/financial coverage.  At her last visit she was given samples of Gemtesa  and she reports that after 3 days she had significant dryness.  Reviewed prior visits and discussed with patient that she was previously seen by Dr. Dante Dyer who is a urogynecologist.    No LMP recorded. Patient is postmenopausal.    Review of Systems:   Pertinent items are noted in HPI Denies fever/chills, dizziness, headaches, visual disturbances, fatigue, shortness of breath, chest pain, abdominal pain, vomiting Pertinent History Reviewed:   Past Surgical History:  Procedure Laterality Date   APPENDECTOMY  1981   BREAST BIOPSY  1984   BREAST LUMPECTOMY  1982   right breast   COLONOSCOPY  2008   Dr. Denece Finger: diverticulosis, hemorrhoids, conscious sedation   COLONOSCOPY N/A 01/30/2016   Dr. Riley Cheadle: mild divertiuculosis in sigmoid colon. narrowing of the colon in association with the deverticular opening. 6 polyps removed, tubular adenoma. next TCS in 3 years   COLONOSCOPY WITH PROPOFOL  N/A 04/17/2021   one 4 mm polyp at splenic flexure, otherwise normal. Tubular adenoma.   CORONARY ANGIOPLASTY WITH STENT PLACEMENT  02/2008   CORONARY BALLOON ANGIOPLASTY N/A 08/17/2021   Procedure: CORONARY BALLOON ANGIOPLASTY;  Surgeon: Millicent Ally, MD;  Location: [facility] IN[facility];  Service: Cardiovascular;  Laterality: N/A;   ESOPHAGOGASTRODUODENOSCOPY  2014   Dr. Sandrea Cruel: erosive gastritis, no h.pylori   ESOPHAGOGASTRODUODENOSCOPY N/A  01/30/2016   Dr. Riley Cheadle: normal esophagus s/p dilation, small hh.   ESOPHAGOGASTRODUODENOSCOPY (EGD) WITH PROPOFOL  N/A 04/17/2021   mild Schatzki ring, s/p dilation. Small hiatal hernia. Normal duodenum.   LEFT HEART CATH AND CORONARY ANGIOGRAPHY N/A 08/17/2021   Procedure: LEFT HEART CATH AND CORONARY ANGIOGRAPHY;  Surgeon: Millicent Ally, MD;  Location: [facility] INVASIVE CV LAB;  Service: Cardiovascular;  Laterality: N/A;   LEFT HEART CATH AND CORONARY ANGIOGRAPHY N/A 08/26/2023   Procedure: LEFT HEART CATH AND CORONARY ANGIOGRAPHY;  Surgeon: Sammy Crisp, MD;  Location: [facility] INVAS[facility] CV LAB;  Service: Cardiovascular;  Laterality: N/A;   LEFT HEART CATHETERIZATION WITH CORONARY ANGIOGRAM N/A 12/02/2012   Procedure: LEFT HEART CATHETERIZATION WITH CORONARY ANGIOGRAM;  Surgeon: Lucendia Rusk, MD;  Location: [facility] CATH LAB;  Service: Cardiovascular;  Laterality: N/A;   MALONEY DILATION N/A 01/30/2016   Procedure: Londa Rival DILATION;  Surgeon: Suzette Espy, MD;  Location: [facility] ENDO SUITE;  Service: Endoscopy;  Laterality: N/A;   MALONEY DILATION N/A 04/17/2021   Procedure: Londa Rival DILATION;  Surgeon: Suzette Espy, MD;  Location: [facility] END[facility];  Service: Endoscopy;  Laterality: N/A;   NASAL SINUS SURGERY     PARTIAL COLECTOMY N/A 05/19/2018   Procedure: PARTIAL COLECTOMY;  Surgeon: Alanda Allegra, MD;  Location: [facility] ORS;  Service: General;  Laterality: N/A;   PERCUTANEOUS CORONARY STENT INTERVENTION (PCI-S)  12/02/2012   Procedure: PERCUTANEOUS CORONARY STENT INTERVENTION (PCI-S);  Surgeon: Lucendia Rusk, MD;  Location: [facility] CATH LAB;  Service: Cardiovascular;;  DES to the MID  LAD   POLYPECTOMY  04/17/2021   Procedure: POLYPECTOMY;  Surgeon: Suzette Espy, MD;  Location: [facility];  Service: Endoscopy;;    Past Medical History:  Diagnosis Date   Allergic rhinitis    CAD (coronary artery disease)    a. s/p DES to RCA in 2009 b.  DES to mid-LAD in 2014 c. 08/2021: ISR of LAD and RCA  stents treated with cutting balloon angioplasty   Cancer [facility])    Diverticulitis    Diverticulosis    Essential hypertension    GERD (gastroesophageal reflux disease)    Glaucoma    History of breast cancer    Right - s/p XRT 1984, surgery, no chemo   Hyperlipidemia    Personal history of radiation therapy    Rotator cuff syndrome, left    Scoliosis    Urinary incontinence    Reviewed problem list, medications and allergies. Physical Assessment:   Vitals:   02/19/24 1347 02/19/24 1354  BP: (!) 160/73 (!) 155/82  Pulse: 64   Weight: 131 lb 9.6 oz (59.7 kg)   Height: 5\' 2"  (1.575 m)   Body mass index is 24.07 kg/m.       Physical Examination:   General appearance: alert, well appearing, and in no distress  Psych: mood appropriate, normal affect  Skin: warm & dry   Cardiovascular: normal heart rate noted  Respiratory: normal respiratory effort, no distress  Abdomen: soft, non-tender   Pelvic: examination not indicated  Extremities: no edema   Chaperone: N/A    Assessment & Plan:  1) OAB - Due to side effects with Gemtesa  concerned that she would have even worse side effects with anticholinergics - Will again try to send in Myrbetriq and patient to reach out to either our office or Dr. Orvis Blare office regarding coverage - Also suggest that if medications are not an option for her that she should follow-up with Dr. Dante Dyer for potential alternative OAB treatments - Reviewed conservative options and those she does not drink a lot of coffee, consider cutting back with her daily cup   2) Chronic HTN - Improved from her recent neurology visit - Patient to continue her current medication and will follow-up with PCP  Meds ordered this encounter  Medications   mirabegron ER (MYRBETRIQ) 50 MG TB24 tablet    Sig: Take 1 tablet (50 mg total) by mouth daily.    Dispense:  90 tablet    Refill:  4      Return if symptoms worsen or fail to improve, for folow up with Dr.  Dante Dyer- Urogyn.   Lamira Borin, DO Attending Obstetrician & Gynecologist, [facility] for [employer], [facility]

## 2024-02-20 ENCOUNTER — Telehealth: Payer: Self-pay | Admitting: Student

## 2024-02-20 DIAGNOSIS — Z79899 Other long term (current) drug therapy: Secondary | ICD-10-CM

## 2024-02-20 NOTE — Telephone Encounter (Signed)
 Called pt. No answer. Left msg to call back.

## 2024-02-20 NOTE — Telephone Encounter (Addendum)
    I was notified by the front office staff that the patient walked in yesterday and wanted to talk with me regarding her elevated BP but I was in clinic. By review of records that she provided, BP was 198/77 at the time of her recent visit with [facility]. She had rechecked BP at home and it was still elevated with SBP in the 150's to 160's.  I last saw the patient in 03/2023 and she was taking Imdur  60 mg daily and Toprol -XL 25 mg daily at that time. She has followed up with Dr. Londa Rival several times in the interim and has been started on Chlorthalidone  12.5 mg daily.  Chlorthalidone  12.5 mg daily was not listed in her medication list at the time of her office visit with him on 01/21/2024. I would double check with the patient to see if she is still taking this? If not, could restart if she tolerated it well. If she is taking it, can further titrate to 25 mg daily but would need a repeat BMET in 2 weeks.    Signed, Dorma Gash, PA-C 02/20/2024, 8:09 AM Pager: [phone]

## 2024-02-21 MED ORDER — CHLORTHALIDONE 25 MG PO TABS: 12.5000 mg | ORAL_TABLET | Freq: Every day | ORAL | 3 refills | Status: DC

## 2024-02-21 NOTE — Telephone Encounter (Signed)
 Spoke to patient who stated that she is no longer taking chlorthalidone . Restarted pt on medication per providers recommendations at 12.5 mg once daily. Pt will have lab work completed at [facility] in 2 weeks.   Patient had no further questions or concerns at this time.

## 2024-02-24 ENCOUNTER — Other Ambulatory Visit (HOSPITAL_COMMUNITY): Payer: Self-pay | Admitting: Family Medicine

## 2024-02-24 DIAGNOSIS — M810 Age-related osteoporosis without current pathological fracture: Secondary | ICD-10-CM

## 2024-02-25 ENCOUNTER — Telehealth: Payer: Self-pay | Admitting: Diagnostic Neuroimaging

## 2024-02-25 NOTE — Telephone Encounter (Signed)
 no auth required sent to [facility] [phone]

## 2024-02-25 NOTE — Telephone Encounter (Signed)
 Patient being referred back to us  by PCP for follow up appointment from 08/2023. Per notes "follow up from ov, was to have MRI, but pt reports not done". Reviewed chart and looks like patient did have MRI done, but based on those results, Dr. Salli Crawley ordered CTA head and neck-these were never scheduled. I reached out to Tori to see what happened with this as I couldn't see any notes on why it wasn't. Looks like referral for these wasn't ever put into her workqueue because the "referred by" location/department was not put as our office. I wanted to check and see, since she's being referred to us  for a follow up, do you think these should still be done prior to her coming in? Would this be beneficial? Or ok to to just set up for OV and can discuss when she comes in

## 2024-02-26 ENCOUNTER — Other Ambulatory Visit (HOSPITAL_COMMUNITY): Payer: Self-pay | Admitting: Family Medicine

## 2024-02-26 DIAGNOSIS — Z1231 Encounter for screening mammogram for malignant neoplasm of breast: Secondary | ICD-10-CM

## 2024-03-03 ENCOUNTER — Other Ambulatory Visit (HOSPITAL_COMMUNITY)

## 2024-03-17 ENCOUNTER — Other Ambulatory Visit: Payer: Self-pay | Admitting: Cardiology

## 2024-03-22 ENCOUNTER — Other Ambulatory Visit: Payer: Self-pay | Admitting: Student

## 2024-04-01 ENCOUNTER — Ambulatory Visit (HOSPITAL_COMMUNITY)
Admission: RE | Admit: 2024-04-01 | Discharge: 2024-04-01 | Disposition: A | Discharge status: Home / Self Care | Source: Ambulatory Visit | Attending: Diagnostic Neuroimaging | Admitting: Diagnostic Neuroimaging

## 2024-04-01 DIAGNOSIS — I639 Cerebral infarction, unspecified: Secondary | ICD-10-CM | POA: Diagnosis present

## 2024-04-01 MED ORDER — IOHEXOL 350 MG/ML SOLN
75.0000 mL | Freq: Once | INTRAVENOUS | Status: AC | PRN
  Administered 2024-04-01: 75 mL via INTRAVENOUS

## 2024-04-13 ENCOUNTER — Telehealth: Payer: Self-pay | Admitting: Cardiology

## 2024-04-13 NOTE — Telephone Encounter (Signed)
 [facility] contacted to verify refill history for amlodipine . Spoke with Sela who says the last time amlodipine  2.5 mg was filled at their store was 10/31/2022.   Left message for patient to call office.

## 2024-04-13 NOTE — Telephone Encounter (Signed)
 Pt c/o medication issue:  1. Name of Medication: Amlodipine   2. How are you currently taking this medication (dosage and times per day)? Not taking  3. Are you having a reaction (difficulty breathing--STAT)? No   4. What is your medication issue? Patient tried to have medication refilled and pharmacy told her it had been discontinued for over a year. I read her the office note for 12/26/22 that she was feeling like she was in a fog and she was taken off it. She was to keep a log of her BP and she how she felt, but she didn't do that. I told her to keep a log of her BP's and if she is seeing that it is high to call and let us  know. She thinks all of the foggy feeling has been coming from her sinus.

## 2024-04-14 ENCOUNTER — Ambulatory Visit: Payer: Self-pay | Admitting: Neurology

## 2024-04-14 NOTE — Telephone Encounter (Signed)
 Advised that she is not supposed to be on amlodipine . Verbalized understanding.

## 2024-04-14 NOTE — Telephone Encounter (Signed)
-----   Message from Charlie DELENA Crete sent at 04/14/2024  1:18 PM EDT ----- Please let her know that the study shows mild narrowing of the arteries due to atherosclerosis but it is not bad enough to require surgery.  She should continue the clopidogrel  ----- Message ----- From: Delfino Augustin BROCKS, RN Sent: 04/14/2024  11:55 AM EDT To: Charlie DELENA Crete, MD  Please review and result in Dr Chancy absence ----- Message ----- From: Interface, Rad Results In Sent: 04/10/2024  11:50 AM EDT To: Dorothy JONELLE Hanlon, MD

## 2024-04-14 NOTE — Telephone Encounter (Signed)
 Called pt at [phone]. LVM for her to call.

## 2024-04-15 NOTE — Telephone Encounter (Signed)
 Called pt. Relayed results per Dr. Vear note. She verbalized understanding. She will request refills from PCP moving forward. She will f/u as needed with our office and continue close f/u with PCP.

## 2024-05-28 ENCOUNTER — Ambulatory Visit (HOSPITAL_COMMUNITY)

## 2024-05-28 ENCOUNTER — Other Ambulatory Visit (HOSPITAL_COMMUNITY)

## 2024-07-05 ENCOUNTER — Other Ambulatory Visit: Payer: Self-pay | Admitting: Cardiology

## 2024-07-20 ENCOUNTER — Telehealth: Payer: Self-pay | Admitting: Cardiology

## 2024-07-20 NOTE — Telephone Encounter (Signed)
   Name: Dorothy Lee  DOB: 01-07-1941  MRN: [id number]  Primary Cardiologist: Jayson Sierras, MD  Chart reviewed as part of pre-operative protocol coverage. Because of Dorothy Lee's past medical history and time since last visit, she will require a follow-up in-office visit in order to better assess preoperative cardiovascular risk.  Pre-op covering staff: - Please schedule appointment and call patient to inform them. If patient already had an upcoming appointment within acceptable timeframe, please add pre-op clearance to the appointment notes so provider is aware. - Please contact requesting surgeon's office via preferred method (i.e, phone, fax) to inform them of need for appointment prior to surgery.  As long as she is asymptomatic at the time of office visit, can hold Plavix  x 7 days prior to procedure and resume the next day when medically safe to do so.  Orren LOISE Fabry, PA-C  07/20/2024, 3:21 PM

## 2024-07-20 NOTE — Telephone Encounter (Signed)
 Caller stated she was returning Pre-op's call.

## 2024-07-20 NOTE — Telephone Encounter (Signed)
 Preop clearance added to appt notes

## 2024-07-20 NOTE — Telephone Encounter (Signed)
   Pre-operative Risk Assessment    Patient Name: Dorothy Lee  DOB: 09-Apr-1941 MRN: [id number]   Date of last office visit: 01/21/24 DR. MCDOWELL Date of next office visit: 07/29/24 DR. MCDOWELL, 6 MONTH F/U   Request for Surgical Clearance    Procedure:  EPIDURAL STEROID INJECTION  Date of Surgery:  Clearance TBD                                Surgeon:  DR. DARLIS Surgeon's Group or Practice Name:  [facility] Phone number:  [phone] Fax number:  [phone] ATTN: RENDA   Type of Clearance Requested:   - Medical  - Pharmacy:  Hold Clopidogrel  (Plavix ) x 7 DAYS PRIOR; RESUME THE NEXT DAY   Type of Anesthesia:  Not Indicated   Additional requests/questions:    Bonney Niels Jest   07/20/2024, 1:58 PM

## 2024-07-28 ENCOUNTER — Institutional Professional Consult (permissible substitution): Admitting: Diagnostic Neuroimaging

## 2024-07-28 ENCOUNTER — Encounter: Payer: Self-pay | Admitting: Diagnostic Neuroimaging

## 2024-07-29 ENCOUNTER — Ambulatory Visit: Attending: Cardiology | Admitting: Cardiology

## 2024-07-29 ENCOUNTER — Encounter: Payer: Self-pay | Admitting: Cardiology

## 2024-08-05 ENCOUNTER — Telehealth: Payer: Self-pay | Admitting: Cardiology

## 2024-08-05 NOTE — Telephone Encounter (Signed)
 Pt c/o swelling: STAT is pt has developed SOB within 24 hours  How much weight have you gained and in what time span?  No weight gain   If swelling, where is the swelling located?  Legs   Are you currently taking a fluid pill?  Doesn't think so   Are you currently SOB?   Do you have a log of your daily weights (if so, list)?    Have you gained 3 pounds in a day or 5 pounds in a week?  No   Have you traveled recently?  No

## 2024-08-05 NOTE — Telephone Encounter (Signed)
 I spoke with spouse and relayed Dr.McDowell's message . I will have front office call him and schedule f/u appointment

## 2024-08-05 NOTE — Telephone Encounter (Signed)
 Spoke with pt who states that she has swelling to the lower extremities that stated 1 week ago. No SOB. Pt states that she normally weighs 122 lbs and recently has been 126 lbs. Denies pain to legs at this time. Pt states that she was weighing 140 lbs but has since lost weight in the last 2 months. Pt encouraged to monitor weight daily and write down weights. Please advise.

## 2024-08-11 ENCOUNTER — Other Ambulatory Visit: Payer: Self-pay | Admitting: Cardiology

## 2024-08-14 ENCOUNTER — Ambulatory Visit: Attending: Student | Admitting: Student

## 2024-08-14 ENCOUNTER — Encounter: Payer: Self-pay | Admitting: Student

## 2024-08-14 VITALS — BP 120/68 | HR 65 | Ht 62.0 in | Wt 126.2 lb

## 2024-08-14 DIAGNOSIS — R600 Localized edema: Secondary | ICD-10-CM | POA: Diagnosis present

## 2024-08-14 DIAGNOSIS — Z01810 Encounter for preprocedural cardiovascular examination: Secondary | ICD-10-CM | POA: Diagnosis present

## 2024-08-14 DIAGNOSIS — E785 Hyperlipidemia, unspecified: Secondary | ICD-10-CM | POA: Insufficient documentation

## 2024-08-14 DIAGNOSIS — I10 Essential (primary) hypertension: Secondary | ICD-10-CM | POA: Insufficient documentation

## 2024-08-14 DIAGNOSIS — I25119 Atherosclerotic heart disease of native coronary artery with unspecified angina pectoris: Secondary | ICD-10-CM | POA: Insufficient documentation

## 2024-08-14 MED ORDER — ROSUVASTATIN CALCIUM 10 MG PO TABS: 10.0000 mg | ORAL_TABLET | Freq: Every day | ORAL | 3 refills | Status: AC

## 2024-08-14 MED ORDER — ISOSORBIDE MONONITRATE ER 60 MG PO TB24: 60.0000 mg | ORAL_TABLET | Freq: Every day | ORAL | 3 refills | Status: AC

## 2024-08-14 MED ORDER — METOPROLOL SUCCINATE ER 50 MG PO TB24: 50.0000 mg | ORAL_TABLET | Freq: Every day | ORAL | 3 refills | Status: AC

## 2024-08-14 MED ORDER — CLOPIDOGREL BISULFATE 75 MG PO TABS: 75.0000 mg | ORAL_TABLET | Freq: Every day | ORAL | 3 refills | Status: AC

## 2024-08-14 MED ORDER — CHLORTHALIDONE 25 MG PO TABS: 12.5000 mg | ORAL_TABLET | Freq: Every day | ORAL | 3 refills | Status: AC

## 2024-08-14 NOTE — Patient Instructions (Signed)
 Medication Instructions:   May hold Plavix  7 days prior to injection   *If you need a refill on your cardiac medications before your next appointment, please call your pharmacy*  Lab Work: NONE  If you have labs (blood work) drawn today and your tests are completely normal, you will receive your results only by: MyChart Message (if you have MyChart) OR A paper copy in the mail If you have any lab test that is abnormal or we need to change your treatment, we will call you to review the results.  Testing/Procedures: NONE   Follow-Up: At [facility], you and your health needs are our priority.  As part of our continuing mission to provide you with exceptional heart care, our providers are all part of one team.  This team includes your primary Cardiologist (physician) and Advanced Practice Providers or APPs (Physician Assistants and Nurse Practitioners) who all work together to provide you with the care you need, when you need it.  Your next appointment:   6 month(s)  Provider:   Jayson Sierras, MD or Laymon Qua, PA-C    We recommend signing up for the patient portal called MyChart.  Sign up information is provided on this After Visit Summary.  MyChart is used to connect with patients for Virtual Visits (Telemedicine).  Patients are able to view lab/test results, encounter notes, upcoming appointments, etc.  Non-urgent messages can be sent to your provider as well.   To learn more about what you can do with MyChart, go to [email or web address].   Other Instructions Thank you for choosing [facility]!

## 2024-08-14 NOTE — Progress Notes (Signed)
 Cardiology Office Note    Date:  08/14/2024  ID:  Inocente LOISE Ore, DOB 1941-08-04, MRN [id number] Cardiologist: Jayson Sierras, MD Cardiology APP:  Johnson Laymon HERO, PA-C { :  History of Present Illness:    Dorothy Lee is a 83 y.o. female with past medical history of CAD (s/p DES to RCA in 2009, DES to mid-LAD in 2014, s/p cath in 08/2021 showing ISR of LAD and RCA stents which were treated with cutting balloon angioplasty, catheterization in 08/2023 showing 60 to 70% proximal and 50% mid RCA ISR with medical management recommended), HTN, HLD, GERD and history of breast cancer who presents to the office today for 36-month follow-up, evaluation of lower extremity edema and preoperative cardiac clearance.   She was last examined by Dr. Sierras in 01/2024 and reported having used nitroglycerin  twice since her last office visit. Did have baseline NYHA class II dyspnea. She had been on both ASA and Plavix  and reported easy bruising with this, therefore was recommended to stop ASA. She was continued on Plavix  75 mg daily, Imdur  60 mg daily, Crestor  10 mg daily, Zetia  10 mg daily and Toprol -XL 75 mg daily. Given her history of statin intolerance and elevated LP(a) of 382, Lipid Clinic referral was recommended. She did reach out to the office in 02/2024 reporting elevated blood pressure and had been without Chlorthalidone  and this was restarted at 12.5 mg daily.  In the interim, the office received a cardiac clearance request for epidural steroid injection. The request was to hold Plavix  for 7 days prior to her procedure. She also contacted the office in 07/2024 reporting worsening lower extremity edema and weight had increased by 4 pounds within the past week. She did have a follow-up visit with Dr. Sierras on 07/29/2024 but no-showed for this, therefore a follow-up visit was arranged for today.  In talking with the patient today, she reports having some lower extremity edema last week when she called  the office but this resolved within a few days. She does report eating at local restaurants around that timeframe. She elevated her legs with improvement in symptoms. She denies any dyspnea on exertion, orthopnea or PND. No recent chest pain or palpitations. Has not utilized sublingual nitroglycerin  since the time of her last visit. She does report having occasional dizziness but says she consumes minimal fluids some days. We reviewed the importance of adequate hydration, especially since on Chlorthalidone .  Studies Reviewed:   EKG: EKG is ordered today and demonstrates:   EKG Interpretation Date/Time:  Friday August 14 2024 15:54:31 EST Ventricular Rate:  72 PR Interval:  132 QRS Duration:  124 QT Interval:  452 QTC Calculation: 494 R Axis:   -50  Text Interpretation: Normal sinus rhythm Left axis deviation Right bundle branch block Confirmed by Johnson Laymon ([id number]) on 08/14/2024 3:57:21 PM       Cardiac Catheterization: 08/2023     Conclusions: Two-vessel coronary artery disease with 30% in-stent restenosis of the proximal/mid LAD as well as sequential 60-70% proximal and 50% mid RCA in-stent restenoses.  No angiographically significant disease noted in the LCx. Hyperdynamic left ventricular systolic function (LVEF > 65%) with normal filling pressure (LVEDP 10 mmHg). Tortuous right radial artery with significant vasospasm.  Consider alternative access for future catheterizations.   Recommendations: Favor escalation of antianginal and blood pressure therapy.  I will double metoprolol  succinate to 25 mg twice daily and continue current dose of isosorbide  mononitrate. If the patient has recurrent chest pain, consider function  assessment and/or PCI to in-stent restenosis of RCA.  Would recommend femoral approach due to challenging right radial access. Aggressive secondary prevention of coronary artery disease.    Physical Exam:   VS:  BP 120/68 (BP Location: Left Arm, Cuff  Size: Normal)   Pulse 65   Ht 5' 2 (1.575 m)   Wt 126 lb 3.2 oz (57.2 kg)   SpO2 95%   BMI 23.08 kg/m    Wt Readings from Last 3 Encounters:  08/14/24 126 lb 3.2 oz (57.2 kg)  02/19/24 131 lb 9.6 oz (59.7 kg)  01/21/24 131 lb (59.4 kg)     GEN: Well nourished, well developed female appearing in no acute distress NECK: No JVD; No carotid bruits CARDIAC: RRR, no murmurs, rubs, gallops RESPIRATORY:  Clear to auscultation without rales, wheezing or rhonchi  ABDOMEN: Appears non-distended. No obvious abdominal masses. EXTREMITIES: No clubbing or cyanosis. No pitting edema.  Distal pedal pulses are 2+ bilaterally.   Assessment and Plan:   1. Coronary artery disease involving native coronary artery of native heart with angina pectoris - She has a history of multiple interventions as discussed above and most recent cardiac catheterization in 08/2023 showed 60 to 70% ISR along the proximal RCA and 50% ISR of the mid RCA with medical management recommended. It was recommended that if she had recurrent chest pain despite optimization of medical therapy, could consider functional assessment and/or PCI to the RCA. - Thankfully, she has overall been doing well and denies any recent anginal symptoms. - She has been continued on Plavix  long-term given her stent burden and would continue with this. Also remains on Zetia  10 mg daily, Imdur  60mg  daily, Toprol -XL 50 mg daily and Crestor  10 mg daily.  2. Essential hypertension - BP is well-controlled at 120/68 during today's visit. Continue current medical therapy with Chlorthalidone  12.5 mg daily, Imdur  60 mg daily and Toprol -XL 50 mg daily. We reviewed the importance of adequate hydration. If she continues to have intermittent dizziness, may need to stop Chlorthalidone  and switch to a different agent. She was previously intolerant to Amlodipine .  3. Hyperlipidemia LDL goal <55 - LDL was at 895 when checked in 10/2023 and LP(a) was previously elevated  at 382 when checked in 08/2023. She has been intolerant to high intensity statin therapy and remains on Crestor  10 mg daily and Zetia  10 mg daily. Will refer to the Pharm D. Clinic for consideration of PCSK9 inhibitor therapy or Leqvio. She is open to either, depending on what her insurance will cover.  4. Lower Extremity Edema - She was previously having some lower extremity edema last week but this has now resolved. No edema on examination today.  Suspect this was secondary to dietary indiscretion in the interim as she was consuming food at [employer]. Was encouraged to monitor sodium intake. If recurrence, would recommend elevation of lower extremities and the use of compression stockings.  5. Preoperative cardiovascular examination for Epidural Steroid Injection - She denies any recent anginal symptoms and does not require further cardiac testing prior to her upcoming injection. She can hold Plavix  for 7 days if indicated but typically this only needs to be held for 5 days. Will route today's note to the requesting provider.   Signed, Laymon CHRISTELLA Qua, PA-C

## 2024-09-09 NOTE — Progress Notes (Deleted)
 Patient ID: Dorothy Lee                 DOB: [birth year]                    MRN: [id number]      HPI: Dorothy Lee is a 83 y.o. female patient referred to lipid clinic by Laymon Qua, PA. PMH is significant for CAD (s/p DES to RCA in 2009, DES to mid-LAD in 2014, s/p cath in 08/2021 showing ISR of LAD and RCA stents which were treated with cutting balloon angioplasty, catheterization in 08/2023 showing 60 to 70% proximal and 50% mid RCA ISR with medical management recommended), HTN, HLD, GERD and history of breast cancer    Patient was last seen by cardiology provider in November 2025 where she was referred to PharmD for consideration of PCSK9i therapy or Leqvio. She is currently managed on rosuvastain 10 mg and ezeimibe 10 mg daily for HLD managementHer most recent LDL is 104 in January 2025 and Lpa was elevated at 382 in 08/2023.   She has been intolerant to high intensity statin therapy and   Reviewed options for lowering LDL cholesterol, including ezetimibe , PCSK-9 inhibitors, bempedoic acid and inclisiran.  Discussed mechanisms of action, dosing, side effects and potential decreases in LDL cholesterol.  Also reviewed cost information and potential options for patient assistance.   Current Medications: rosuvastatin  10 mg daily, and ezetimibe  10 mg daily Intolerances: rosuvastatin  20 mg (muscle aches), simvastatin (muscle aches), atorvastatin  40 mg (fatigue) Risk Factors: CAD s/p DES to RCA (2009), DES to mid-LAD (2014), HTN, age, elevated Lpa LDL goal: <55 Lipid panel (10/2023): Chol 171, Trig 116, HDL 46, LDL 104 Lpa (08/2023): 382.5 Liver enzymes: AST 20, ALT 18, Alk phos 46  Diet:  Breakfast: Lunch/Dinner: Snacks: Beverages:  Exercise:   Family History:  Relation Problem Comments  Mother (Deceased)   Father (Deceased) Emphysema     Sister (Deceased) Heart attack     Brother (Deceased) Emphysema     Brother (Deceased) Heart disease x3 brothers    Brother (Deceased)  Lung cancer     Brother (Deceased) Heart attack   Throat cancer head/neck cancer    Maternal Grandmother (Deceased)   Maternal Grandfather (Deceased)   Paternal Grandmother (Deceased)   Paternal Grandfather (Deceased)   Son (Deceased) Heart attack blood clot    Neg Hx Colon cancer       Social History:  Alcohol: Smoking:  Labs:  Lipid Panel     Component Value Date/Time   CHOL 171 10/18/2023 1344   TRIG 116 10/18/2023 1344   TRIG 119 05/16/2010 0000   HDL 46 10/18/2023 1344   CHOLHDL 3.7 10/18/2023 1344   CHOLHDL 4.2 08/01/2023 1207   VLDL 29 08/01/2023 1207   LDLCALC 104 (H) 10/18/2023 1344   LDLCALC 99 05/05/2019 1124   LDLDIRECT 144.4 11/09/2013 0934   LABVLDL 21 10/18/2023 1344    Past Medical History:  Diagnosis Date   Allergic rhinitis    CAD (coronary artery disease)    a. s/p DES to RCA in 2009 b.  DES to mid-LAD in 2014 c. 08/2021: ISR of LAD and RCA stents treated with cutting balloon angioplasty   Cancer [place])    Diverticulitis    Diverticulosis    Essential hypertension    GERD (gastroesophageal reflux disease)    Glaucoma    History of breast cancer    Right - s/p XRT 1984, surgery, no chemo  Hyperlipidemia    Personal history of radiation therapy    Rotator cuff syndrome, left    Scoliosis    Urinary incontinence     Current Outpatient Medications on File Prior to Visit  Medication Sig Dispense Refill   chlorthalidone  (HYGROTON ) 25 MG tablet Take 0.5 tablets (12.5 mg total) by mouth daily. 45 tablet 3   clopidogrel  (PLAVIX ) 75 MG tablet Take 1 tablet (75 mg total) by mouth daily. 90 tablet 3   Cyanocobalamin (VITAMIN B-12 CR PO) Take by mouth.     DULoxetine  (CYMBALTA ) 60 MG capsule Take 60 mg by mouth daily.     ezetimibe  (ZETIA ) 10 MG tablet TAKE 1 TABLET(10 MG) BY MOUTH DAILY. 90 tablet 1   fluticasone  (FLONASE ) 50 MCG/ACT nasal spray Place 1 spray into both nostrils daily as needed for allergies. (Patient not taking: Reported on  08/14/2024)     isosorbide  mononitrate (IMDUR ) 60 MG 24 hr tablet Take 1 tablet (60 mg total) by mouth daily. 90 tablet 3   latanoprost  (XALATAN ) 0.005 % ophthalmic solution Place 1 drop into both eyes at bedtime.      meclizine  (ANTIVERT ) 25 MG tablet Take 25 mg by mouth 3 (three) times daily as needed for dizziness.     metoprolol  succinate (TOPROL -XL) 50 MG 24 hr tablet Take 1 tablet (50 mg total) by mouth daily. Take with or immediately following a meal. 90 tablet 3   mirabegron  ER (MYRBETRIQ ) 50 MG TB24 tablet Take 1 tablet (50 mg total) by mouth daily. (Patient not taking: Reported on 08/14/2024) 90 tablet 4   montelukast  (SINGULAIR ) 10 MG tablet Take 10 mg by mouth daily.     nitroGLYCERIN  (NITROSTAT ) 0.4 MG SL tablet Place 1 tablet (0.4 mg total) under the tongue every 5 (five) minutes x 3 doses as needed for chest pain. May repeat x3 25 tablet 3   pantoprazole  (PROTONIX ) 40 MG tablet TAKE 1 TABLET BY MOUTH TWICE DAILY FOR 2 WEEKS THEN REDUCE TO 1 TABLET BY MOUTH EVERY DAY 90 tablet 2   rosuvastatin  (CRESTOR ) 10 MG tablet Take 1 tablet (10 mg total) by mouth daily. 90 tablet 3   Wheat Dextrin (BENEFIBER PO) Take 1 Dose by mouth daily.     No current facility-administered medications on file prior to visit.    Allergies  Allergen Reactions   Crestor  [Rosuvastatin ]     Crestor  20 mg caused muscle aches - pt is able to tolerate lower doses (takes 10mg )   Lipitor [Atorvastatin ]     Atorvastatin  40 mg caused fatigue   Lisinopril  Cough   Simvastatin     Muscle aches    Assessment/Plan:  1. Hyperlipidemia -  No problems updated. No problem-specific Assessment & Plan notes found for this encounter.    Thank you,  Buffy Ehler E. Dorrien Grunder, Pharm.D, CPP [facility]. [facility] [address], [place], [place] [address] Phone: [phone]; Fax: [phone]

## 2024-09-10 ENCOUNTER — Ambulatory Visit: Attending: Cardiology

## 2024-10-06 ENCOUNTER — Telehealth: Payer: Self-pay | Admitting: Cardiology

## 2024-10-06 NOTE — Telephone Encounter (Signed)
 [facility] is requesting patient recent EKG, Lab work, medication list, and recent office visit notes so anesthesiologist  can review before surgery 10/12/24  Fax: [phone] Phone: [phone]

## 2024-10-06 NOTE — Telephone Encounter (Signed)
 Wrong number given for Dorothy Lee in pre-admission testing - operator at [facility] keeps sending me to phlebotomy [phone]  I spoke with someone in 1 day surgery and they said pre-admission testing has gone home for the day but she would leave our fax number to medical records- [phone] for Dorothy Lee tomorrow.

## 2024-10-06 NOTE — Telephone Encounter (Signed)
 Request needs to go to medical records, will forward

## 2024-10-09 NOTE — Telephone Encounter (Signed)
 I gave Benton the number for [facility] medical records and she will fax her records request to them

## 2024-10-09 NOTE — Telephone Encounter (Signed)
 Caller [employer]) provided new fax number to send information - Office Note, EKG, labs to fax# [phone].  Caller noted patient has surgery scheduled on Monday, 1/5.

## 2024-10-28 ENCOUNTER — Ambulatory Visit
